# Patient Record
Sex: Male | Born: 1950 | Race: White | Hispanic: No | Marital: Married | Smoking: Former smoker
Health system: Southern US, Community
[De-identification: ages and names within clinical notes are randomized; demographics above are authoritative.]

## PROBLEM LIST (undated history)

## (undated) DIAGNOSIS — M6530 Trigger finger, unspecified finger: Secondary | ICD-10-CM

## (undated) DIAGNOSIS — R918 Other nonspecific abnormal finding of lung field: Secondary | ICD-10-CM

## (undated) DIAGNOSIS — H9190 Unspecified hearing loss, unspecified ear: Secondary | ICD-10-CM

## (undated) DIAGNOSIS — K8020 Calculus of gallbladder without cholecystitis without obstruction: Secondary | ICD-10-CM

## (undated) DIAGNOSIS — I2510 Atherosclerotic heart disease of native coronary artery without angina pectoris: Secondary | ICD-10-CM

## (undated) DIAGNOSIS — K219 Gastro-esophageal reflux disease without esophagitis: Secondary | ICD-10-CM

## (undated) DIAGNOSIS — I639 Cerebral infarction, unspecified: Secondary | ICD-10-CM

## (undated) DIAGNOSIS — R252 Cramp and spasm: Secondary | ICD-10-CM

## (undated) DIAGNOSIS — M1990 Unspecified osteoarthritis, unspecified site: Secondary | ICD-10-CM

## (undated) DIAGNOSIS — IMO0001 Reserved for inherently not codable concepts without codable children: Secondary | ICD-10-CM

## (undated) DIAGNOSIS — Z8719 Personal history of other diseases of the digestive system: Secondary | ICD-10-CM

## (undated) DIAGNOSIS — R7303 Prediabetes: Secondary | ICD-10-CM

## (undated) DIAGNOSIS — G5600 Carpal tunnel syndrome, unspecified upper limb: Secondary | ICD-10-CM

## (undated) DIAGNOSIS — H269 Unspecified cataract: Secondary | ICD-10-CM

## (undated) DIAGNOSIS — F32A Depression, unspecified: Secondary | ICD-10-CM

## (undated) DIAGNOSIS — R51 Headache: Secondary | ICD-10-CM

## (undated) DIAGNOSIS — K760 Fatty (change of) liver, not elsewhere classified: Secondary | ICD-10-CM

## (undated) DIAGNOSIS — C801 Malignant (primary) neoplasm, unspecified: Secondary | ICD-10-CM

## (undated) DIAGNOSIS — Z87442 Personal history of urinary calculi: Secondary | ICD-10-CM

## (undated) DIAGNOSIS — M8580 Other specified disorders of bone density and structure, unspecified site: Secondary | ICD-10-CM

## (undated) DIAGNOSIS — R52 Pain, unspecified: Secondary | ICD-10-CM

## (undated) DIAGNOSIS — Z973 Presence of spectacles and contact lenses: Secondary | ICD-10-CM

## (undated) DIAGNOSIS — K759 Inflammatory liver disease, unspecified: Secondary | ICD-10-CM

## (undated) DIAGNOSIS — F909 Attention-deficit hyperactivity disorder, unspecified type: Secondary | ICD-10-CM

## (undated) HISTORY — PX: TONSILLECTOMY: SUR1361

## (undated) HISTORY — PX: OTHER SURGICAL HISTORY: SHX169

## (undated) HISTORY — PX: ROOT CANAL: SHX2363

## (undated) HISTORY — PX: ROTATOR CUFF REPAIR: SHX139

## (undated) HISTORY — PX: TRIGGER FINGER RELEASE: SHX641

## (undated) HISTORY — PX: LUMBAR FUSION: SHX111

## (undated) HISTORY — PX: JOINT REPLACEMENT: SHX530

## (undated) HISTORY — PX: COLONOSCOPY: SHX174

---

## 1999-05-23 ENCOUNTER — Encounter: Payer: Self-pay | Admitting: Gastroenterology

## 1999-05-23 ENCOUNTER — Ambulatory Visit (HOSPITAL_COMMUNITY): Admission: RE | Admit: 1999-05-23 | Discharge: 1999-05-23 | Payer: Self-pay | Admitting: Gastroenterology

## 1999-09-12 DIAGNOSIS — K759 Inflammatory liver disease, unspecified: Secondary | ICD-10-CM

## 1999-09-12 HISTORY — DX: Inflammatory liver disease, unspecified: K75.9

## 2000-08-27 ENCOUNTER — Encounter (INDEPENDENT_AMBULATORY_CARE_PROVIDER_SITE_OTHER): Payer: Self-pay | Admitting: *Deleted

## 2000-08-27 ENCOUNTER — Ambulatory Visit (HOSPITAL_COMMUNITY): Admission: RE | Admit: 2000-08-27 | Discharge: 2000-08-27 | Payer: Self-pay | Admitting: Gastroenterology

## 2000-08-27 ENCOUNTER — Encounter: Payer: Self-pay | Admitting: Gastroenterology

## 2002-03-24 ENCOUNTER — Encounter: Payer: Self-pay | Admitting: Family Medicine

## 2002-03-24 ENCOUNTER — Encounter: Admission: RE | Admit: 2002-03-24 | Discharge: 2002-03-24 | Payer: Self-pay | Admitting: Family Medicine

## 2003-05-25 ENCOUNTER — Ambulatory Visit (HOSPITAL_COMMUNITY): Admission: RE | Admit: 2003-05-25 | Discharge: 2003-05-25 | Payer: Self-pay | Admitting: Gastroenterology

## 2003-05-25 ENCOUNTER — Encounter (INDEPENDENT_AMBULATORY_CARE_PROVIDER_SITE_OTHER): Payer: Self-pay | Admitting: Specialist

## 2003-12-04 ENCOUNTER — Emergency Department (HOSPITAL_COMMUNITY): Admission: EM | Admit: 2003-12-04 | Discharge: 2003-12-04 | Payer: Self-pay | Admitting: *Deleted

## 2005-02-08 ENCOUNTER — Encounter: Admission: RE | Admit: 2005-02-08 | Discharge: 2005-02-08 | Payer: Self-pay | Admitting: Family Medicine

## 2005-05-02 ENCOUNTER — Encounter: Admission: RE | Admit: 2005-05-02 | Discharge: 2005-05-02 | Payer: Self-pay | Admitting: Family Medicine

## 2005-08-29 ENCOUNTER — Encounter: Admission: RE | Admit: 2005-08-29 | Discharge: 2005-08-29 | Payer: Self-pay | Admitting: Orthopedic Surgery

## 2006-07-27 ENCOUNTER — Encounter: Admission: RE | Admit: 2006-07-27 | Discharge: 2006-07-27 | Payer: Self-pay | Admitting: Family Medicine

## 2007-10-07 ENCOUNTER — Encounter: Admission: RE | Admit: 2007-10-07 | Discharge: 2007-10-07 | Payer: Self-pay | Admitting: Family Medicine

## 2007-10-30 ENCOUNTER — Encounter: Payer: Self-pay | Admitting: Internal Medicine

## 2007-10-31 ENCOUNTER — Encounter (INDEPENDENT_AMBULATORY_CARE_PROVIDER_SITE_OTHER): Payer: Self-pay | Admitting: Internal Medicine

## 2007-10-31 ENCOUNTER — Ambulatory Visit: Payer: Self-pay | Admitting: Surgery

## 2007-10-31 ENCOUNTER — Ambulatory Visit (HOSPITAL_COMMUNITY): Admission: EM | Admit: 2007-10-31 | Discharge: 2007-10-31 | Payer: Self-pay | Admitting: Internal Medicine

## 2009-09-11 DIAGNOSIS — R519 Headache, unspecified: Secondary | ICD-10-CM

## 2009-09-11 HISTORY — PX: OTHER SURGICAL HISTORY: SHX169

## 2009-09-11 HISTORY — DX: Headache, unspecified: R51.9

## 2011-01-24 NOTE — Discharge Summary (Signed)
[name], [name]             ACCOUNT NO.:  [id number]   MEDICAL RECORD NO.:  [id number]          PATIENT TYPE:  OBV   LOCATION:  0102                         FACILITY:  [facility]   PHYSICIAN:  Corinna L. Lendell Caprice, MDDATE OF BIRTH:  1950/11/02   DATE OF ADMISSION:  10/30/2007  DATE OF DISCHARGE:  10/31/2007                               DISCHARGE SUMMARY   DISCHARGE DIAGNOSES:  1. Transient ischemic attack.  2. Hyperlipidemia.  3. History of hepatitis C.  4. Cigar smoker.   DISCHARGE MEDICATIONS:  1. Aspirin 325 mg a day.  2. Continue Zetia 10 mg a day.   DIET:  Should be low fat, low cholesterol.   He is encouraged to quit smoking cigars.   CONDITION:  Stable.   FOLLOW UP:  Dr. Manus Gunning in one to two weeks at which time the rest of  the hypercoagulable panel and the echocardiogram results can be followed  up.   CONSULTATION:  Bevelyn Buckles. Nash Shearer, M.D.   PROCEDURES:  None.   LABORATORY DATA:  Homocystine level normal.  LDL 116, HDL 31,  triglycerides 129, total cholesterol 173.  Point care enzymes negative.  Basic metabolic panel unremarkable.  Initial CBC was significant for a  white blood cell count of 13,000 but this normalized.  PT/PTT normal.  Hypercoagulable panel is pending.   SPECIAL STUDIES/RADIOLOGY:  Carotid Dopplers negative for stenoses.  Echocardiogram is pending.  EKG showed normal sinus rhythm with left  anterior fascicular block.  CT brain negative.  MRI of the brain,  preliminary report shows no stroke, chronic right maxillary sinusitis.  MRA of the brain preliminary is normal.   HISTORY AND HOSPITAL COURSE:  [name] is a 60 year old white male  patient of Dr. Manus Gunning, who presented with diplopia, gait disturbance.  He had fallen and was listing to the right.  He had some numbness and  tingling on the right side of his face as well as the fourth and fifth  finger of his right hand.  His hand seemed uncoordinated.  Some of his  symptoms lasted  for about 5 minutes, some lasted for 2 hours.  He was  dizzy.  By the time he was seen by me in the emergency room, he has a  normal neurologic exam, he had normal vital signs and had already been  seen by neurology.  He was placed on 23-hour observation in the  telemetry unit where he remained in normal sinus rhythm.  He was started  on an aspirin a day.  He has several family members with blood clotting  problems and a hypercoagulable workup was ordered and is pending.  Echocardiogram also is pending.  He had no recurrence of his symptoms.  He is on Zeta and apparently has never been on a Statin due to his  history of hepatitis C.  I recommended lifestyle modification and if his  LDL is still elevated, consider Niaspan or a Statin if appropriate.      Corinna L. Lendell Caprice, MD  Electronically Signed     CLS/MEDQ  D:  10/31/2007  T:  11/01/2007  Job:  [id number]  cc:   Bryan Lemma. Manus Gunning, M.D.

## 2011-01-24 NOTE — H&P (Signed)
NAMEKORBIN, MAPPS             ACCOUNT NO.:  [id number]   MEDICAL RECORD NO.:  [id number]          PATIENT TYPE:  EMS   LOCATION:  ED                           FACILITY:  [facility]   PHYSICIAN:  Corinna L. Lendell Caprice, MDDATE OF BIRTH:  [birth year]   DATE OF ADMISSION:  10/30/2007  DATE OF DISCHARGE:                              HISTORY & PHYSICAL   CHIEF COMPLAINT:  Visual disturbance and difficulty walking.   HISTORY OF PRESENT ILLNESS:  Mr. Strauch is a pleasant, 60 year old,  white male patient of Dr. Manus Gunning who presents after having an episode  of diplopia. One of his hands was having problems coordinating, he  cannot recall whether it was the right or the left. His right fourth and  fifth fingers were tingling. The lower part of the right side of his  face was tingling. His right leg seemed uncoordinated and he was having  difficulty walking, he fell. He has had the diplopia in the past but  never had problems with numbness or gait disturbance. He was told in the  past that he had migraines. He has hyperlipidemia. He smokes cigars.  He  has a previous tobacco use history. His symptoms have resolved. This  started sometime after lunch. He has already been seen by Dr. Nash Shearer  who recommends admission and workup for TIA. The patient has never had a  stroke. He is complaining of pain behind both eyes currently.   PAST MEDICAL HISTORY:  As above.  Also history of treated hepatitis C.   MEDICATIONS:  Zetia 10 mg a day.   SOCIAL HISTORY:  He smokes a few cigars per day and inhales. He denies  drug use. He is married.   FAMILY HISTORY:  Significant for hypertension. Also he has 1or 2  siblings with clotting disorders and or thromboses.   REVIEW OF SYSTEMS:  As above otherwise negative.   PHYSICAL EXAMINATION:  Temperature is 97.3, blood pressure 133/83, pulse  90, respiratory rate 18, oxygen saturation 98% on room air.  GENERAL:  The patient is well-nourished, well-developed in  no acute  distress.  HEENT:  Normocephalic, atraumatic.  Pupils equal, round, reactive to  light.  Sclerae are nonicteric.  Moist mucous membranes.  No nystagmus.  NECK:  Supple.  No thyromegaly. No carotid bruits.  No JVD or  lymphadenopathy.  LUNGS:  Clear to auscultation bilaterally without wheezes, rhonchi or  rales.  CARDIOVASCULAR:  Regular rate and rhythm without murmurs,  gallops or rubs.  ABDOMEN:  Normal bowel sounds, soft, nontender, nondistended.  GU/RECTAL:  Deferred.  EXTREMITIES:  No clubbing, cyanosis or edema.  SKIN:  No rash.  NEUROLOGIC:  The patient is alert and oriented x3.  Cranial nerves are  intact.  Motor strength 5/5.  Gait normal.  Romberg negative.  Finger-to-  nose normal.  Pinprick sensation intact. Deep tendon reflexes symmetric.  No pronator drift. Visual acuity is normal.  Visual fields full.   LABORATORY DATA:  White blood cell count is 13,000 with normal  differential otherwise normal CBC. PT/PTT normal.  Basic metabolic panel  unremarkable. Troponin less than 0.05, myoglobin 96,  CPK-MB less than 1.  EKG shows normal sinus rhythm with left axis deviation.  CT brain shows  nothing acute.   ASSESSMENT/PLAN:  1. Transient visual disturbance with gait disturbance and right-sided      paresthesias and discoordination:  The patient will be placed on 23-      hour observation.  I will start an aspirin a day.  Check MRI, MRA      of the brain, carotid Dopplers, echocardiogram, fasting lipids,      homocysteine level and hypercoagulable panel. He will get neuro      checks q. shift and DVT prophylaxis.  Dr. Nash Shearer will continue to      follow.  2. Hyperlipidemia.  3. History of hepatitis C.  4. Cigar smoker.  5. Leukocytosis. The patient is afebrile and has no signs or symptoms      of infection. I will simply repeat the CBC in the morning.      Corinna L. Lendell Caprice, MD  Electronically Signed     CLS/MEDQ  D:  10/30/2007  T:  10/31/2007  Job:   [id number]   cc:   Bryan Lemma. Manus Gunning, M.D.  Fax: [phone]

## 2011-01-24 NOTE — Consult Note (Signed)
[name] [name], [name] [name]             ACCOUNT NO.:  [id number]   MEDICAL RECORD NO.:  [id number]          PATIENT TYPE:  EMS   LOCATION:  ED                           FACILITY:  [facility]   PHYSICIAN:  Bevelyn Buckles. Champey, M.D.DATE OF BIRTH:  1951/07/10   DATE OF CONSULTATION:  10/30/2007  DATE OF DISCHARGE:                                 CONSULTATION   REFERRING PHYSICIAN:  Markham Jordan L. Effie Shy, M.D.   REASON FOR CONSULTATION:  TIA/stroke.   HISTORY OF PRESENT ILLNESS:  [name] [name] is a 60 year old Caucasian  male with past medical history of high cholesterol who presents after an  episode of unsteadiness, walking to the right, right facial numbness,  and fall.  The patient states he has a 1-1/2-year history of visual  migraines.  He has had a CT in the past which was negative.  He also has  been having for the last few months eye twitching and dizziness  episodes, around one per month.  Today he was driving and developed some  visual and eye trembling and proceeded to have decreased vision/blurred  vision and almost double vision.  This was followed by right lower face  numbness and the patient being unsteady on his feet, walking to the  right as he stumbled and held on when he tried to get out of his car.  This lasted about 5-10 minutes and was followed by extreme fatigue and  dizziness.  He states he is now back to baseline and denies any focal  weakness, further numbness, loss of consciousness, speech changes, or  swallowing problems/chewing problems.   PAST MEDICAL HISTORY:  1. History of hepatitis C.  2. High cholesterol.   CURRENT MEDICATIONS:  Zetia and nicotine patch.   ALLERGIES:  NONE.   FAMILY HISTORY:  Positive for hypertension and clotting problems.   SOCIAL HISTORY:  The patient lives with his wife and son.  He smokes one  cigar per day.  He drinks a few rum drinks per night.   REVIEW OF SYSTEMS:  Positive as per HPI.  Review of systems negative as  per HPI in greater  than seven other organ systems.   PHYSICAL EXAMINATION:  VITAL SIGNS:  Temperature is 97.3, blood pressure  is 133/83, pulse is 90, respirations 19, O2 saturation 98%.  HEENT:  Normocephalic, atraumatic.  Extraocular muscles intact.  Pupils  equal, round, and reactive to light.  Visual fields are intact.  NECK:  Supple.  No carotid bruits are heard.  HEART:  Regular.  LUNGS:  Clear.  ABDOMEN:  Soft, nontender.  EXTREMITIES:  Good pulses.  NEUROLOGIC:  The patient is awake, alert.  Language is fluent.  The  patient is following commands appropriately.  Cranial nerves II-XII  grossly intact.  Motor examination shows 5/5 strength and no drift  noted.  Sensory examination is within normal limits.  Light touch  reflexes are 1+ and symmetric.  Cerebellar function is within normal  limits.  Finger-to-nose, heel-to-shin, gait is within normal limits.  Negative Romberg.   LABORATORY DATA:  WBC 13.11, hemoglobin 16.7, hematocrit 48.2, platelets  248.  Sodium 142,  potassium 4.8, chloride is 105, CO2 is 31, BUN 10,  creatinine 0.95, glucose 111, calcium is 9.7.  PT is 13.8, INR is 1.0,  PTT is 25.   CT of the head done on October 07, 2007 showed no acute abnormalities.   IMPRESSION:  This is a 60 year old Caucasian male with transient right  facial numbness, walking to the right, and dizziness that has completely  resolved.  Concerning for posterior circulation transient ischemic  attack or small stroke.  The patient not a candidate for intravenous t-  PA.  Symptoms have resolved.  Would recommend starting the patient on  aspirin a day given his risk factors.  We will get an MRI/MRA of brain,  carotid Dopplers, 2-D echocardiogram, and check a hypercoagulable panel.  Check fasting lipids and homocysteine level.  Get physical therapy and  occupational therapy consults.  This patient on deep venous thrombosis  prophylaxis.  Will follow the patient as consultants.      Bevelyn Buckles. Nash Shearer,  M.D.  Electronically Signed     DRC/MEDQ  D:  10/30/2007  T:  10/31/2007  Job:  [id number]

## 2011-01-27 NOTE — Op Note (Signed)
   [name], [name]                       ACCOUNT NO.:  [id number]   MEDICAL RECORD NO.:  [id number]                   PATIENT TYPE:  AMB   LOCATION:  ENDO                                 FACILITY:  [facility]   PHYSICIAN:  Petra Kuba, M.D.                 DATE OF BIRTH:  [birth year]   DATE OF PROCEDURE:  DATE OF DISCHARGE:                                 OPERATIVE REPORT   PROCEDURE:  Colonoscopy.   ENDOSCOPIST:  Petra Kuba, M.D.   INDICATIONS FOR PROCEDURE:  Bright red blood per rectum probably due to  hemorrhoids.  The patient is due for colonic screening.   INFORMED CONSENT:  Consent was signed after risks, benefits, methods and  options were thoroughly discussed in the office.   MEDICATIONS USED:  Demerol 75 mg, Versed 7.5 mg.   DESCRIPTION OF PROCEDURE:  Rectal inspection was pertinent for external  hemorrhoids, small.  Digital exam was negative.   The video colonoscope was inserted and advanced around the colon to the  cecum.  This did require rolling him on his back and some abdominal  pressure.  No obvious abnormality was seen on insertion.  The cecum was  identified by the appendiceal orifice and the ileocecal valve.  The scope  was inserted a short ways into the terminal ileum which was normal.  Photodocumentation was obtained.  The scope was slowly withdrawn.  On slow  withdrawal through the colon, the prep was adequate.  There was some liquid  stool that required washing and suctioning.  The cecum, ascending and  transverse were normal.  As the scope was withdrawn around the left side of  the colon, multiple tiny hyperplastic appearing polyps were seen and were  hot biopsied.  All in the descending and sigmoid were hot biopsied.  Two in  the rectum were hot biopsied but a few in the rectum were not.  Anal rectal  pull through and retroflexion did confirm some small hemorrhoids.  No other  abnormalities were seen.  The scope was reinserted a short ways into  the  left side of the colon.  Air was suctioned and scope removed.  The patient  tolerated the procedure well.  There were no obvious immediate complication.   ENDOSCOPIC DIAGNOSES:  1. Internal and external hemorrhoids.  2. Multiple left-sided questionable hyperplastic appearing polyps, hot     biopsied.  3. Otherwise within normal limits to the terminal ileum.   PLAN:  1. Await pathology to determine future colonic screening.  2. Happy to see back p.r.n.  Otherwise, yearly rectals and guaiacs per Dr.     Levora Dredge, and at this junction every six months to one year liver tests     to screen for recurrence of hepatitis C.     MEM/MEDQ  D:  05/25/2003  T:  05/25/2003  Job:  [id number]

## 2011-06-02 LAB — LUPUS ANTICOAGULANT PANEL
DRVVT: 47.5 — ABNORMAL HIGH (ref 36.1–47.0)
Lupus Anticoagulant: NOT DETECTED
PTT Lupus Anticoagulant: 42.2 (ref 36.3–48.8)
dRVVT Incubated 1:1 Mix: 43.5 (ref 36.1–47.0)

## 2011-06-02 LAB — FACTOR 5 LEIDEN

## 2011-06-02 LAB — PROTHROMBIN GENE MUTATION

## 2011-06-02 LAB — BETA-2-GLYCOPROTEIN I ABS, IGG/M/A
Beta-2 Glyco I IgG: <4 U/mL (ref <20)
Beta-2-Glycoprotein I IgA: <4 U/mL (ref <10)
Beta-2-Glycoprotein I IgM: <4 U/mL (ref <10)

## 2011-06-02 LAB — LIPID PANEL
Cholesterol: 173
HDL: 31 — ABNORMAL LOW
LDL Cholesterol: 116 — ABNORMAL HIGH
Total CHOL/HDL Ratio: 5.6
Triglycerides: 129
VLDL: 26

## 2011-06-02 LAB — CBC
HCT: 47
Hemoglobin: 15.8
MCHC: 33.7
MCV: 93.2
Platelets: 212
RBC: 5.04
RDW: 12.7
WBC: 8.7

## 2011-06-02 LAB — PROTEIN C, TOTAL: Protein C, Total: 84 % (ref 70–140)

## 2011-06-02 LAB — ANTITHROMBIN III: AntiThromb III Func: 101 (ref 76–126)

## 2011-06-02 LAB — PROTEIN S, TOTAL: Protein S Ag, Total: 99 % (ref 70–140)

## 2011-06-02 LAB — HOMOCYSTEINE: Homocysteine: 8.7

## 2011-06-02 LAB — CARDIOLIPIN ANTIBODIES, IGG, IGM, IGA
Anticardiolipin IgA: 18 (ref <13)
Anticardiolipin IgG: <7 — ABNORMAL LOW (ref <11)
Anticardiolipin IgM: 15 (ref <10)

## 2011-06-02 LAB — PROTEIN C ACTIVITY: Protein C Activity: 129 % (ref 75–133)

## 2011-06-02 LAB — PROTEIN S ACTIVITY: Protein S Activity: 101 % (ref 69–129)

## 2011-06-05 LAB — BASIC METABOLIC PANEL
BUN: 10
CO2: 31
Calcium: 9.7
Chloride: 105
Creatinine, Ser: 0.95
GFR calc Af Amer: >60
GFR calc non Af Amer: >60
Glucose, Bld: 111 — ABNORMAL HIGH
Potassium: 4.8
Sodium: 142

## 2011-06-05 LAB — APTT: aPTT: 25

## 2011-06-05 LAB — DIFFERENTIAL
Basophils Absolute: 0
Basophils Relative: 0
Eosinophils Absolute: 0.2
Eosinophils Relative: 2
Lymphocytes Relative: 16
Lymphs Abs: 2.1
Monocytes Absolute: 0.8
Monocytes Relative: 6
Neutro Abs: 10 — ABNORMAL HIGH
Neutrophils Relative %: 76

## 2011-06-05 LAB — CBC
HCT: 48.2
Hemoglobin: 16.7
MCHC: 34.6
MCV: 91.9
Platelets: 248
RBC: 5.25
RDW: 13
WBC: 13.1 — ABNORMAL HIGH

## 2011-06-05 LAB — POCT CARDIAC MARKERS
CKMB, poc: <1 — ABNORMAL LOW
Myoglobin, poc: 96.8
Operator id: 1415
Troponin i, poc: <0.05

## 2011-06-05 LAB — PROTIME-INR
INR: 1
Prothrombin Time: 13.8

## 2011-06-05 LAB — HOMOCYSTEINE: Homocysteine: 9.5

## 2012-07-30 ENCOUNTER — Other Ambulatory Visit: Payer: Self-pay | Admitting: Family Medicine

## 2012-07-30 DIAGNOSIS — IMO0002 Reserved for concepts with insufficient information to code with codable children: Secondary | ICD-10-CM

## 2012-07-31 ENCOUNTER — Ambulatory Visit
Admission: RE | Admit: 2012-07-31 | Discharge: 2012-07-31 | Disposition: A | Discharge status: Home / Self Care | Payer: Managed Care, Other (non HMO) | Source: Ambulatory Visit | Attending: Family Medicine | Admitting: Family Medicine

## 2012-07-31 DIAGNOSIS — IMO0002 Reserved for concepts with insufficient information to code with codable children: Secondary | ICD-10-CM

## 2013-07-02 ENCOUNTER — Encounter (HOSPITAL_COMMUNITY): Payer: Self-pay | Admitting: Pharmacy Technician

## 2013-07-04 ENCOUNTER — Other Ambulatory Visit (HOSPITAL_COMMUNITY): Payer: Self-pay | Admitting: *Deleted

## 2013-07-04 NOTE — Patient Instructions (Signed)
20      Your procedure is scheduled on:  Monday 07/14/2013  Report to [facility] at [phone]  AM.  Call this number if you have problems the night before or morning of surgery:  [phone]   Remember:             IF YOU USE CPAP,BRING MASK AND TUBING AM OF SURGERY!   Do not eat food or drink liquids AFTER MIDNIGHT!  Take these medicines the morning of surgery with A SIP OF WATER: NONE   Do not bring valuables to the hospital. [facility] IS NOT RESPONSIBLE   FOR ANY BELONGINGS OR VALUABLES BROUGHT TO HOSPITAL.  Marland Kitchen  Leave suitcase in the car. After surgery it may be brought to your room.  For patients admitted to the hospital, checkout time is 11:00 AM the day of              Discharge.    DO NOT WEAR JEWELRY , MAKE-UP, LOTIONS,POWDERS,PERFUMES!             WOMEN -DO NOT SHAVE LEGS OR UNDERARMS 12 HRS. BEFORE SURGERY!               MEN MAY SHAVE AS USUAL!             CONTACTS,DENTURES OR BRIDGEWORK, FALSE EYELASHES MAY  NOT BE WORN INTO SURGERY!                                           Patients discharged the day of surgery will not be allowed to drive home.If going home the same day of surgery, must have someone stay with you first 24 hrs.at home and arrange for someone to drive you home from the Hospital.                           YOUR DRIVER IS:N/A   Special Instructions:             Please read over the following fact sheets that you were given:             1. [facility] PREPARING FOR SURGERY SHEET              2.[facility]                                        [place].Henson Fraticelli,RN,BSN     [phone]                FAILURE TO FOLLOW THESE INSTRUCTIONS MAY RESULT IN  CANCELLATION OF YOUR SURGERY!               Patient Signature:___________________________

## 2013-07-07 ENCOUNTER — Encounter (HOSPITAL_COMMUNITY): Payer: Self-pay

## 2013-07-07 ENCOUNTER — Encounter (HOSPITAL_COMMUNITY)
Admission: RE | Admit: 2013-07-07 | Discharge: 2013-07-07 | Disposition: A | Discharge status: Home / Self Care | Payer: BC Managed Care – PPO | Source: Ambulatory Visit | Attending: Orthopedic Surgery | Admitting: Orthopedic Surgery

## 2013-07-07 ENCOUNTER — Encounter (INDEPENDENT_AMBULATORY_CARE_PROVIDER_SITE_OTHER): Payer: Self-pay

## 2013-07-07 DIAGNOSIS — Z01812 Encounter for preprocedural laboratory examination: Secondary | ICD-10-CM | POA: Insufficient documentation

## 2013-07-07 DIAGNOSIS — Z01818 Encounter for other preprocedural examination: Secondary | ICD-10-CM | POA: Insufficient documentation

## 2013-07-07 HISTORY — DX: Unspecified osteoarthritis, unspecified site: M19.90

## 2013-07-07 HISTORY — DX: Inflammatory liver disease, unspecified: K75.9

## 2013-07-07 LAB — CBC
HCT: 50 % (ref 39.0–52.0)
Hemoglobin: 17.8 g/dL — ABNORMAL HIGH (ref 13.0–17.0)
MCH: 33.3 pg (ref 26.0–34.0)
MCHC: 35.6 g/dL (ref 30.0–36.0)
MCV: 93.5 fL (ref 78.0–100.0)
Platelets: 199 10*3/uL (ref 150–400)
RBC: 5.35 MIL/uL (ref 4.22–5.81)
RDW: 12.9 % (ref 11.5–15.5)
WBC: 7.9 10*3/uL (ref 4.0–10.5)

## 2013-07-07 LAB — URINALYSIS, ROUTINE W REFLEX MICROSCOPIC
Bilirubin Urine: NEGATIVE
Glucose, UA: NEGATIVE mg/dL
Hgb urine dipstick: NEGATIVE
Ketones, ur: NEGATIVE mg/dL
Leukocytes, UA: NEGATIVE
Nitrite: NEGATIVE
Protein, ur: NEGATIVE mg/dL
Specific Gravity, Urine: 1.022 (ref 1.005–1.030)
Urobilinogen, UA: 0.2 mg/dL (ref 0.0–1.0)
pH: 5 (ref 5.0–8.0)

## 2013-07-07 LAB — SURGICAL PCR SCREEN
MRSA, PCR: INVALID — AB
Staphylococcus aureus: INVALID — AB

## 2013-07-07 LAB — BASIC METABOLIC PANEL
BUN: 13 mg/dL (ref 6–23)
CO2: 24 mEq/L (ref 19–32)
Calcium: 9.6 mg/dL (ref 8.4–10.5)
Chloride: 101 mEq/L (ref 96–112)
Creatinine, Ser: 0.8 mg/dL (ref 0.50–1.35)
GFR calc Af Amer: >90 mL/min (ref >90)
GFR calc non Af Amer: >90 mL/min (ref >90)
Glucose, Bld: 126 mg/dL — ABNORMAL HIGH (ref 70–99)
Potassium: 4 mEq/L (ref 3.5–5.1)
Sodium: 137 mEq/L (ref 135–145)

## 2013-07-07 LAB — PROTIME-INR
INR: 1.02 (ref 0.00–1.49)
Prothrombin Time: 13.2 seconds (ref 11.6–15.2)

## 2013-07-07 LAB — APTT: aPTT: 29 seconds (ref 24–37)

## 2013-07-07 NOTE — Progress Notes (Signed)
Pre-op clearance note on chart from Dr. Manus Gunning 05/06/2013.

## 2013-07-07 NOTE — Progress Notes (Signed)
07/07/13 0859  OBSTRUCTIVE SLEEP APNEA  Have you ever been diagnosed with sleep apnea through a sleep study? No  Do you snore loudly (loud enough to be heard through closed doors)?  1  Do you often feel tired, fatigued, or sleepy during the daytime? 1  Has anyone observed you stop breathing during your sleep? 0  Do you have, or are you being treated for high blood pressure? 0  BMI more than 35 kg/m2? 0  Age over 60 years old? 1  Neck circumference greater than 40 cm/18 inches? 0  Gender: 1  Obstructive Sleep Apnea Score 4  Score 4 or greater  Results sent to PCP

## 2013-07-09 LAB — MRSA CULTURE

## 2013-07-13 NOTE — H&P (Signed)
TOTAL KNEE ADMISSION H&P  Patient is being admitted for left total knee arthroplasty.  Subjective:  Chief Complaint:    Left knee OA / pain.  HPI: [name] [name], 62 y.o. male, has a history of pain and functional disability in the left knee due to arthritis and has failed non-surgical conservative treatments for greater than 12 weeks to includeNSAID's and/or analgesics, corticosteriod injections, viscosupplementation injections and activity modification.  Onset of symptoms was gradual, starting >10 years ago with gradually worsening course since that time. The patient noted prior procedures on the knee to include  arthroscopy on the left knee(s).  Patient currently rates pain in the left knee(s) at 7 out of 10 with activity. Patient has night pain, worsening of pain with activity and weight bearing, pain that interferes with activities of daily living, pain with passive range of motion, crepitus and joint swelling.  Patient has evidence of periarticular osteophytes and joint space narrowing by imaging studies. There is no active infection.  Risks, benefits and expectations were discussed with the patient. Patient understand the risks, benefits and expectations and wishes to proceed with surgery.   D/C Plans:   Home with HHPT  Post-op Meds:    No Rx given  Tranexamic Acid:   To be given  Decadron:    To be given  FYI:    ASA post-op   There are no active problems to display for this patient.  Past Medical History  Diagnosis Date  . Arthritis   . Hepatitis 2001    C -treated and no problems since    Past Surgical History  Procedure Laterality Date  . Tonsillectomy      as child  . Left ankle surgery      fractured  . Left shoulder      torn ligaments left shoulder  . Left knee surgery      removed ligaments    No prescriptions prior to admission   Allergies  Allergen Reactions  . Statins Other (See Comments)    Muscle pain    History  Substance Use Topics  . Smoking  status: Former Smoker    Quit date: 04/30/2012  . Smokeless tobacco: Not on file  . Alcohol Use: Yes     Comment: occassionally    No family history on file.   Review of Systems  Constitutional: Negative.   HENT: Negative.   Eyes: Negative.   Respiratory: Positive for shortness of breath (on exertion).   Cardiovascular: Negative.   Gastrointestinal: Negative.   Genitourinary: Negative.   Musculoskeletal: Positive for joint pain.  Skin: Negative.   Neurological: Negative.   Endo/Heme/Allergies: Negative.   Psychiatric/Behavioral: Negative.     Objective:  Physical Exam  Constitutional: He is oriented to person, place, and time. He appears well-developed and well-nourished.  HENT:  Head: Normocephalic and atraumatic.  Mouth/Throat: Oropharynx is clear and moist.  Eyes: Pupils are equal, round, and reactive to light.  Neck: Neck supple. No JVD present. No tracheal deviation present. No thyromegaly present.  Cardiovascular: Normal rate, regular rhythm and intact distal pulses.   Respiratory: Effort normal and breath sounds normal. No respiratory distress. He has no wheezes.  GI: Soft. There is no tenderness. There is no guarding.  Musculoskeletal:       Left knee: He exhibits decreased range of motion, swelling and bony tenderness. He exhibits no effusion, no ecchymosis, no laceration and no erythema. Tenderness found.  Lymphadenopathy:    He has no cervical adenopathy.  Neurological:  He is alert and oriented to person, place, and time.  Skin: Skin is warm and dry.  Psychiatric: He has a normal mood and affect.     Imaging Review Plain radiographs demonstrate severe degenerative joint disease of the left knee(s). The overall alignment isneutral. The bone quality appears to be good for age and reported activity level.  Assessment/Plan:  End stage arthritis, left knee   The patient history, physical examination, clinical judgment of the provider and imaging studies are  consistent with end stage degenerative joint disease of the left knee(s) and total knee arthroplasty is deemed medically necessary. The treatment options including medical management, injection therapy arthroscopy and arthroplasty were discussed at length. The risks and benefits of total knee arthroplasty were presented and reviewed. The risks due to aseptic loosening, infection, stiffness, patella tracking problems, thromboembolic complications and other imponderables were discussed. The patient acknowledged the explanation, agreed to proceed with the plan and consent was signed. Patient is being admitted for inpatient treatment for surgery, pain control, PT, OT, prophylactic antibiotics, VTE prophylaxis, progressive ambulation and ADL's and discharge planning. The patient is planning to be discharged home with home health services.    [name]   PAC  07/13/2013, 8:55 PM

## 2013-07-14 ENCOUNTER — Encounter (HOSPITAL_COMMUNITY): Payer: Self-pay | Admitting: *Deleted

## 2013-07-14 ENCOUNTER — Encounter (HOSPITAL_COMMUNITY): Payer: BC Managed Care – PPO | Admitting: Anesthesiology

## 2013-07-14 ENCOUNTER — Encounter (HOSPITAL_COMMUNITY)
Admission: RE | Disposition: A | Discharge status: Home Health | Payer: Self-pay | Source: Ambulatory Visit | Attending: Orthopedic Surgery

## 2013-07-14 ENCOUNTER — Inpatient Hospital Stay (HOSPITAL_COMMUNITY): Payer: BC Managed Care – PPO | Admitting: Anesthesiology

## 2013-07-14 ENCOUNTER — Inpatient Hospital Stay (HOSPITAL_COMMUNITY)
Admission: RE | Admit: 2013-07-14 | Discharge: 2013-07-15 | DRG: 470 | Disposition: A | Discharge status: Home Health | Payer: BC Managed Care – PPO | Source: Ambulatory Visit | Attending: Orthopedic Surgery | Admitting: Orthopedic Surgery

## 2013-07-14 DIAGNOSIS — Z96659 Presence of unspecified artificial knee joint: Secondary | ICD-10-CM

## 2013-07-14 DIAGNOSIS — M6580 Other synovitis and tenosynovitis, unspecified site: Secondary | ICD-10-CM | POA: Diagnosis present

## 2013-07-14 DIAGNOSIS — D500 Iron deficiency anemia secondary to blood loss (chronic): Secondary | ICD-10-CM | POA: Diagnosis not present

## 2013-07-14 DIAGNOSIS — D62 Acute posthemorrhagic anemia: Secondary | ICD-10-CM | POA: Diagnosis not present

## 2013-07-14 DIAGNOSIS — Z6829 Body mass index (BMI) 29.0-29.9, adult: Secondary | ICD-10-CM

## 2013-07-14 DIAGNOSIS — E663 Overweight: Secondary | ICD-10-CM

## 2013-07-14 DIAGNOSIS — Z01812 Encounter for preprocedural laboratory examination: Secondary | ICD-10-CM

## 2013-07-14 DIAGNOSIS — M1710 Unilateral primary osteoarthritis, unspecified knee: Principal | ICD-10-CM | POA: Diagnosis present

## 2013-07-14 DIAGNOSIS — Z96652 Presence of left artificial knee joint: Secondary | ICD-10-CM

## 2013-07-14 HISTORY — PX: TOTAL KNEE ARTHROPLASTY: SHX125

## 2013-07-14 LAB — TYPE AND SCREEN
ABO/RH(D): O NEG
Antibody Screen: NEGATIVE

## 2013-07-14 LAB — ABO/RH: ABO/RH(D): O NEG

## 2013-07-14 SURGERY — ARTHROPLASTY, KNEE, TOTAL
Anesthesia: Spinal | Site: Knee | Laterality: Left | Wound class: Clean

## 2013-07-14 MED ORDER — METOCLOPRAMIDE HCL 10 MG PO TABS: 5.0000 mg | ORAL_TABLET | Freq: Three times a day (TID) | ORAL | Status: DC | PRN

## 2013-07-14 MED ORDER — FERROUS SULFATE 325 (65 FE) MG PO TABS
325.0000 mg | ORAL_TABLET | Freq: Three times a day (TID) | ORAL | Status: DC
  Administered 2013-07-14 – 2013-07-15 (×2): 325 mg via ORAL
  Filled 2013-07-14 (×5): qty 1

## 2013-07-14 MED ORDER — SODIUM CHLORIDE 0.9 % IJ SOLN
INTRAMUSCULAR | Status: DC | PRN
  Administered 2013-07-14: 11:00:00

## 2013-07-14 MED ORDER — CELECOXIB 200 MG PO CAPS
200.0000 mg | ORAL_CAPSULE | Freq: Two times a day (BID) | ORAL | Status: DC
  Administered 2013-07-14 – 2013-07-15 (×2): 200 mg via ORAL
  Filled 2013-07-14 (×3): qty 1

## 2013-07-14 MED ORDER — CEFAZOLIN SODIUM-DEXTROSE 2-3 GM-% IV SOLR
2.0000 g | Freq: Four times a day (QID) | INTRAVENOUS | Status: AC
  Administered 2013-07-14 (×2): 2 g via INTRAVENOUS
  Filled 2013-07-14 (×2): qty 50

## 2013-07-14 MED ORDER — 0.9 % SODIUM CHLORIDE (POUR BTL) OPTIME
TOPICAL | Status: DC | PRN
  Administered 2013-07-14: 1000 mL

## 2013-07-14 MED ORDER — ONDANSETRON HCL 4 MG PO TABS: 4.0000 mg | ORAL_TABLET | Freq: Four times a day (QID) | ORAL | Status: DC | PRN

## 2013-07-14 MED ORDER — CEFAZOLIN SODIUM-DEXTROSE 2-3 GM-% IV SOLR
INTRAVENOUS | Status: AC
  Filled 2013-07-14: qty 50

## 2013-07-14 MED ORDER — PHENOL 1.4 % MT LIQD
1.0000 | OROMUCOSAL | Status: DC | PRN
  Filled 2013-07-14: qty 177

## 2013-07-14 MED ORDER — LORATADINE 10 MG PO TABS
10.0000 mg | ORAL_TABLET | Freq: Every day | ORAL | Status: DC
  Administered 2013-07-15: 10 mg via ORAL
  Filled 2013-07-14: qty 1

## 2013-07-14 MED ORDER — KETOROLAC TROMETHAMINE 30 MG/ML IJ SOLN
INTRAMUSCULAR | Status: AC
  Filled 2013-07-14: qty 1

## 2013-07-14 MED ORDER — BUPIVACAINE-EPINEPHRINE 0.25% -1:200000 IJ SOLN
INTRAMUSCULAR | Status: DC | PRN
  Administered 2013-07-14: 25 mL

## 2013-07-14 MED ORDER — DEXAMETHASONE SODIUM PHOSPHATE 10 MG/ML IJ SOLN
10.0000 mg | Freq: Once | INTRAMUSCULAR | Status: AC
  Administered 2013-07-15: 10 mg via INTRAVENOUS
  Filled 2013-07-14: qty 1

## 2013-07-14 MED ORDER — FENTANYL CITRATE 0.05 MG/ML IJ SOLN
INTRAMUSCULAR | Status: DC | PRN
  Administered 2013-07-14: 25 ug via INTRAVENOUS
  Administered 2013-07-14: 50 ug via INTRAVENOUS
  Administered 2013-07-14: 25 ug via INTRAVENOUS
  Administered 2013-07-14 (×2): 50 ug via INTRAVENOUS
  Administered 2013-07-14: 100 ug via INTRAVENOUS
  Administered 2013-07-14 (×2): 25 ug via INTRAVENOUS

## 2013-07-14 MED ORDER — BISACODYL 10 MG RE SUPP: 10.0000 mg | Freq: Every day | RECTAL | Status: DC | PRN

## 2013-07-14 MED ORDER — DOCUSATE SODIUM 100 MG PO CAPS
100.0000 mg | ORAL_CAPSULE | Freq: Two times a day (BID) | ORAL | Status: DC
  Administered 2013-07-14 – 2013-07-15 (×2): 100 mg via ORAL

## 2013-07-14 MED ORDER — ZOLPIDEM TARTRATE 5 MG PO TABS: 5.0000 mg | ORAL_TABLET | Freq: Every evening | ORAL | Status: DC | PRN

## 2013-07-14 MED ORDER — DEXAMETHASONE SODIUM PHOSPHATE 10 MG/ML IJ SOLN
10.0000 mg | INTRAMUSCULAR | Status: AC
  Administered 2013-07-14: 10 mg via INTRAVENOUS

## 2013-07-14 MED ORDER — SODIUM CHLORIDE 0.9 % IJ SOLN
INTRAMUSCULAR | Status: AC
  Filled 2013-07-14: qty 50

## 2013-07-14 MED ORDER — ALUM & MAG HYDROXIDE-SIMETH 200-200-20 MG/5ML PO SUSP: 30.0000 mL | ORAL | Status: DC | PRN

## 2013-07-14 MED ORDER — DIPHENHYDRAMINE HCL 25 MG PO CAPS: 25.0000 mg | ORAL_CAPSULE | Freq: Four times a day (QID) | ORAL | Status: DC | PRN

## 2013-07-14 MED ORDER — ONDANSETRON HCL 4 MG/2ML IJ SOLN: 4.0000 mg | Freq: Four times a day (QID) | INTRAMUSCULAR | Status: DC | PRN

## 2013-07-14 MED ORDER — METHOCARBAMOL 100 MG/ML IJ SOLN
500.0000 mg | Freq: Four times a day (QID) | INTRAVENOUS | Status: DC | PRN
  Administered 2013-07-14: 14:00:00 500 mg via INTRAVENOUS
  Filled 2013-07-14 (×2): qty 5

## 2013-07-14 MED ORDER — PROPOFOL 10 MG/ML IV BOLUS
INTRAVENOUS | Status: DC | PRN
  Administered 2013-07-14: 30 mg via INTRAVENOUS

## 2013-07-14 MED ORDER — LACTATED RINGERS IV SOLN
INTRAVENOUS | Status: DC | PRN
  Administered 2013-07-14 (×3): via INTRAVENOUS

## 2013-07-14 MED ORDER — POLYETHYLENE GLYCOL 3350 17 G PO PACK
17.0000 g | PACK | Freq: Two times a day (BID) | ORAL | Status: DC
  Administered 2013-07-14 – 2013-07-15 (×2): 17 g via ORAL

## 2013-07-14 MED ORDER — METHOCARBAMOL 500 MG PO TABS
500.0000 mg | ORAL_TABLET | Freq: Four times a day (QID) | ORAL | Status: DC | PRN
  Administered 2013-07-14 – 2013-07-15 (×3): 500 mg via ORAL
  Filled 2013-07-14 (×3): qty 1

## 2013-07-14 MED ORDER — BUPIVACAINE-EPINEPHRINE PF 0.25-1:200000 % IJ SOLN
INTRAMUSCULAR | Status: AC
  Filled 2013-07-14: qty 30

## 2013-07-14 MED ORDER — METOCLOPRAMIDE HCL 5 MG/ML IJ SOLN: 5.0000 mg | Freq: Three times a day (TID) | INTRAMUSCULAR | Status: DC | PRN

## 2013-07-14 MED ORDER — SODIUM CHLORIDE 0.9 % IR SOLN
Status: DC | PRN
  Administered 2013-07-14: 1000 mL

## 2013-07-14 MED ORDER — HYDROMORPHONE HCL PF 1 MG/ML IJ SOLN
0.5000 mg | INTRAMUSCULAR | Status: DC | PRN
Start: 2013-07-14 — End: 2013-07-15
  Administered 2013-07-14 – 2013-07-15 (×4): 1 mg via INTRAVENOUS
  Filled 2013-07-14 (×4): qty 1

## 2013-07-14 MED ORDER — FLEET ENEMA 7-19 GM/118ML RE ENEM: 1.0000 | ENEMA | Freq: Once | RECTAL | Status: AC | PRN

## 2013-07-14 MED ORDER — TRANEXAMIC ACID 100 MG/ML IV SOLN
1000.0000 mg | Freq: Once | INTRAVENOUS | Status: AC
  Administered 2013-07-14: 1000 mg via INTRAVENOUS
  Filled 2013-07-14: qty 10

## 2013-07-14 MED ORDER — PROMETHAZINE HCL 25 MG/ML IJ SOLN: 6.2500 mg | INTRAMUSCULAR | Status: DC | PRN

## 2013-07-14 MED ORDER — KETOROLAC TROMETHAMINE 30 MG/ML IJ SOLN
INTRAMUSCULAR | Status: DC | PRN
  Administered 2013-07-14: 30 mg

## 2013-07-14 MED ORDER — MENTHOL 3 MG MT LOZG
1.0000 | LOZENGE | OROMUCOSAL | Status: DC | PRN
  Filled 2013-07-14: qty 9

## 2013-07-14 MED ORDER — PROPOFOL INFUSION 10 MG/ML OPTIME
INTRAVENOUS | Status: DC | PRN
  Administered 2013-07-14: 100 ug/kg/min via INTRAVENOUS

## 2013-07-14 MED ORDER — HYDROCODONE-ACETAMINOPHEN 7.5-325 MG PO TABS
1.0000 | ORAL_TABLET | ORAL | Status: DC
  Administered 2013-07-14 – 2013-07-15 (×6): 2 via ORAL
  Filled 2013-07-14 (×6): qty 2

## 2013-07-14 MED ORDER — SODIUM CHLORIDE 0.9 % IV SOLN
INTRAVENOUS | Status: DC
  Administered 2013-07-14 – 2013-07-15 (×2): via INTRAVENOUS
  Filled 2013-07-14 (×5): qty 1000

## 2013-07-14 MED ORDER — BUPIVACAINE LIPOSOME 1.3 % IJ SUSP
20.0000 mL | Freq: Once | INTRAMUSCULAR | Status: DC
  Filled 2013-07-14: qty 20

## 2013-07-14 MED ORDER — HYDROMORPHONE HCL PF 1 MG/ML IJ SOLN: 0.2500 mg | INTRAMUSCULAR | Status: DC | PRN

## 2013-07-14 MED ORDER — CEFAZOLIN SODIUM-DEXTROSE 2-3 GM-% IV SOLR
2.0000 g | INTRAVENOUS | Status: AC
  Administered 2013-07-14: 2 g via INTRAVENOUS

## 2013-07-14 MED ORDER — MIDAZOLAM HCL 5 MG/5ML IJ SOLN
INTRAMUSCULAR | Status: DC | PRN
  Administered 2013-07-14 (×2): 2 mg via INTRAVENOUS

## 2013-07-14 MED ORDER — LACTATED RINGERS IV SOLN
INTRAVENOUS | Status: DC
  Administered 2013-07-14: 1000 mL via INTRAVENOUS

## 2013-07-14 MED ORDER — ASPIRIN EC 325 MG PO TBEC
325.0000 mg | DELAYED_RELEASE_TABLET | Freq: Two times a day (BID) | ORAL | Status: DC
  Administered 2013-07-15: 08:00:00 325 mg via ORAL
  Filled 2013-07-14 (×3): qty 1

## 2013-07-14 SURGICAL SUPPLY — 57 items
ADH SKN CLS APL DERMABOND .7 (GAUZE/BANDAGES/DRESSINGS) ×1
BAG SPEC THK2 15X12 ZIP CLS (MISCELLANEOUS) ×1
BAG ZIPLOCK 12X15 (MISCELLANEOUS) ×2 IMPLANT
BANDAGE ELASTIC 6 VELCRO ST LF (GAUZE/BANDAGES/DRESSINGS) ×2 IMPLANT
BANDAGE ESMARK 6X9 LF (GAUZE/BANDAGES/DRESSINGS) ×1 IMPLANT
BLADE SAW SGTL 13.0X1.19X90.0M (BLADE) ×2 IMPLANT
BNDG CMPR 9X6 STRL LF SNTH (GAUZE/BANDAGES/DRESSINGS) ×1
BNDG ESMARK 6X9 LF (GAUZE/BANDAGES/DRESSINGS) ×2
BOWL SMART MIX CTS (DISPOSABLE) ×2 IMPLANT
CAPT RP KNEE ×1 IMPLANT
CEMENT HV SMART SET (Cement) ×2 IMPLANT
CUFF TOURN SGL QUICK 34 (TOURNIQUET CUFF) ×2
CUFF TRNQT CYL 34X4X40X1 (TOURNIQUET CUFF) ×1 IMPLANT
DECANTER SPIKE VIAL GLASS SM (MISCELLANEOUS) ×1 IMPLANT
DERMABOND ADVANCED (GAUZE/BANDAGES/DRESSINGS) ×1
DERMABOND ADVANCED .7 DNX12 (GAUZE/BANDAGES/DRESSINGS) ×1 IMPLANT
DRAPE EXTREMITY T 121X128X90 (DRAPE) ×2 IMPLANT
DRAPE POUCH INSTRU U-SHP 10X18 (DRAPES) ×2 IMPLANT
DRAPE U-SHAPE 47X51 STRL (DRAPES) ×2 IMPLANT
DRSG AQUACEL AG ADV 3.5X10 (GAUZE/BANDAGES/DRESSINGS) ×2 IMPLANT
DRSG TEGADERM 4X4.75 (GAUZE/BANDAGES/DRESSINGS) ×2 IMPLANT
DURAPREP 26ML APPLICATOR (WOUND CARE) ×4 IMPLANT
ELECT REM PT RETURN 9FT ADLT (ELECTROSURGICAL) ×2
ELECTRODE REM PT RTRN 9FT ADLT (ELECTROSURGICAL) ×1 IMPLANT
EVACUATOR 1/8 PVC DRAIN (DRAIN) ×2 IMPLANT
FACESHIELD LNG OPTICON STERILE (SAFETY) ×10 IMPLANT
GAUZE SPONGE 2X2 8PLY STRL LF (GAUZE/BANDAGES/DRESSINGS) ×1 IMPLANT
GLOVE BIOGEL PI IND STRL 7.5 (GLOVE) ×1 IMPLANT
GLOVE BIOGEL PI IND STRL 8 (GLOVE) ×1 IMPLANT
GLOVE BIOGEL PI INDICATOR 7.5 (GLOVE) ×1
GLOVE BIOGEL PI INDICATOR 8 (GLOVE) ×1
GLOVE ECLIPSE 8.0 STRL XLNG CF (GLOVE) ×2 IMPLANT
GLOVE ORTHO TXT STRL SZ7.5 (GLOVE) ×4 IMPLANT
GOWN BRE IMP PREV XXLGXLNG (GOWN DISPOSABLE) ×2 IMPLANT
GOWN PREVENTION PLUS LG XLONG (DISPOSABLE) ×4 IMPLANT
HANDPIECE INTERPULSE COAX TIP (DISPOSABLE) ×2
KIT BASIN OR (CUSTOM PROCEDURE TRAY) ×2 IMPLANT
MANIFOLD NEPTUNE II (INSTRUMENTS) ×2 IMPLANT
NDL SAFETY ECLIPSE 18X1.5 (NEEDLE) ×1 IMPLANT
NEEDLE HYPO 18GX1.5 SHARP (NEEDLE) ×2
NS IRRIG 1000ML POUR BTL (IV SOLUTION) ×2 IMPLANT
PACK TOTAL JOINT (CUSTOM PROCEDURE TRAY) ×2 IMPLANT
POSITIONER SURGICAL ARM (MISCELLANEOUS) ×2 IMPLANT
SET HNDPC FAN SPRY TIP SCT (DISPOSABLE) ×1 IMPLANT
SET PAD KNEE POSITIONER (MISCELLANEOUS) ×2 IMPLANT
SPONGE GAUZE 2X2 STER 10/PKG (GAUZE/BANDAGES/DRESSINGS) ×1
SUCTION FRAZIER 12FR DISP (SUCTIONS) ×2 IMPLANT
SUT MNCRL AB 4-0 PS2 18 (SUTURE) ×2 IMPLANT
SUT VIC AB 1 CT1 36 (SUTURE) ×2 IMPLANT
SUT VIC AB 2-0 CT1 27 (SUTURE) ×6
SUT VIC AB 2-0 CT1 TAPERPNT 27 (SUTURE) ×3 IMPLANT
SUT VLOC 180 0 24IN GS25 (SUTURE) ×2 IMPLANT
SYR 50ML LL SCALE MARK (SYRINGE) ×2 IMPLANT
TOWEL OR 17X26 10 PK STRL BLUE (TOWEL DISPOSABLE) ×4 IMPLANT
TRAY FOLEY CATH 14FRSI W/METER (CATHETERS) ×2 IMPLANT
WATER STERILE IRR 1500ML POUR (IV SOLUTION) ×3 IMPLANT
WRAP KNEE MAXI GEL POST OP (GAUZE/BANDAGES/DRESSINGS) ×2 IMPLANT

## 2013-07-14 NOTE — Evaluation (Signed)
Physical Therapy Evaluation Patient Details Name: [name] [name] MRN: [id number] DOB: 1951-08-30 Today's Date: 07/14/2013 Time: 7829-5621 PT Time Calculation (min): 22 min  PT Assessment / Plan / Recommendation History of Present Illness  LTKA  Clinical Impression  Pt tolerated ambulating in hall, Plans for DC tomorrow. Pain = 8 after ambulation. Pt will benefit from PT to address problems the patient wants to try crutches.    PT Assessment  Patient needs continued PT services    Follow Up Recommendations  Home health PT    Does the patient have the potential to tolerate intense rehabilitation      Barriers to Discharge        Equipment Recommendations  Rolling walker with 5" wheels (may not need, use crutches.)    Recommendations for Other Services     Frequency 7X/week    Precautions / Restrictions Precautions Precautions: Fall;Knee   Pertinent Vitals/Pain 4-8. L knee, ice      Mobility  Bed Mobility Bed Mobility: Supine to Sit;Sit to Supine Supine to Sit: 4: Min guard Sit to Supine: 4: Min guard Details for Bed Mobility Assistance: pt managed LLE  Transfers Transfers: Sit to Stand;Stand to Sit Sit to Stand: 4: Min assist;From bed;From chair/3-in-1;With upper extremity assist;With armrests Stand to Sit: 4: Min assist;To bed;To chair/3-in-1;With upper extremity assist;With armrests Details for Transfer Assistance: cues for safety and UE position. cues for slow process to stand. Ambulation/Gait Ambulation/Gait Assistance: 4: Min assist Ambulation Distance (Feet): 100 Feet Assistive device: Rolling walker Ambulation/Gait Assistance Details: cues for step lenght and safe use of RW. Gait Pattern: Step-through pattern;Step-to pattern;Antalgic    Exercises Total Joint Exercises Quad Sets: AROM;Left;5 reps;Supine   PT Diagnosis: Difficulty walking;Acute pain  PT Problem List: Decreased strength;Decreased range of motion;Decreased activity tolerance;Decreased  mobility;Decreased knowledge of use of DME;Decreased safety awareness;Decreased knowledge of precautions;Pain PT Treatment Interventions: DME instruction;Gait training;Stair training;Functional mobility training;Patient/family education;Therapeutic activities;Therapeutic exercise     PT Goals(Current goals can be found in the care plan section) Acute Rehab PT Goals Patient Stated Goal: to walk PT Goal Formulation: With patient/family Time For Goal Achievement: 07/17/13 Potential to Achieve Goals: Good  Visit Information  Last PT Received On: 07/14/13 Assistance Needed: +1 History of Present Illness: LTKA       Prior Functioning  Home Living Family/patient expects to be discharged to:: Private residence Living Arrangements: Spouse/significant other Available Help at Discharge: Family Type of Home: House Home Access: Stairs to enter Secretary/administrator of Steps: 3 Entrance Stairs-Rails: None Home Layout: One level Home Equipment: Crutches Prior Function Level of Independence: Independent Communication Communication: No difficulties    Cognition  Cognition Arousal/Alertness: Awake/alert Behavior During Therapy: WFL for tasks assessed/performed Overall Cognitive Status: Within Functional Limits for tasks assessed    Extremity/Trunk Assessment Upper Extremity Assessment Upper Extremity Assessment: Overall WFL for tasks assessed Lower Extremity Assessment Lower Extremity Assessment: LLE deficits/detail LLE Deficits / Details: able to perform SLR, knee flexed to 40   Balance    End of Session PT - End of Session Activity Tolerance: Patient tolerated treatment well Patient left: in bed;with call bell/phone within reach;with family/visitor present Nurse Communication: Mobility status  GP     Rada Hay 07/14/2013, 6:29 PM Blanchard Kelch PT [phone]

## 2013-07-14 NOTE — Plan of Care (Signed)
Problem: Consults Goal: Diagnosis- Total Joint Replacement Left total knee     

## 2013-07-14 NOTE — Anesthesia Preprocedure Evaluation (Addendum)
Anesthesia Evaluation  Patient identified by MRN, date of birth, ID band Patient awake    Reviewed: Allergy & Precautions, H&P , NPO status , Patient's Chart, lab work & pertinent test results  Airway Mallampati: II TM Distance: >3 FB Neck ROM: Full    Dental no notable dental hx.    Pulmonary neg pulmonary ROS,  breath sounds clear to auscultation  Pulmonary exam normal       Cardiovascular negative cardio ROS  Rhythm:Regular Rate:Normal     Neuro/Psych negative neurological ROS  negative psych ROS   GI/Hepatic negative GI ROS, Neg liver ROS,   Endo/Other  negative endocrine ROS  Renal/GU negative Renal ROS  negative genitourinary   Musculoskeletal negative musculoskeletal ROS (+)   Abdominal   Peds negative pediatric ROS (+)  Hematology negative hematology ROS (+)   Anesthesia Other Findings   Reproductive/Obstetrics negative OB ROS                           Anesthesia Physical Anesthesia Plan  ASA: II  Anesthesia Plan: Spinal   Post-op Pain Management:    Induction: Intravenous  Airway Management Planned: Simple Face Mask  Additional Equipment:   Intra-op Plan:   Post-operative Plan:   Informed Consent: I have reviewed the patients History and Physical, chart, labs and discussed the procedure including the risks, benefits and alternatives for the proposed anesthesia with the patient or authorized representative who has indicated his/her understanding and acceptance.     Plan Discussed with: CRNA and Surgeon  Anesthesia Plan Comments:         Anesthesia Quick Evaluation  

## 2013-07-14 NOTE — Anesthesia Procedure Notes (Signed)
Spinal  Patient location during procedure: OR End time: 07/14/2013 10:16 AM Staffing CRNA/Resident: Treyveon Mochizuki E Preanesthetic Checklist Completed: patient identified, site marked, surgical consent, pre-op evaluation, timeout performed, IV checked, risks and benefits discussed and monitors and equipment checked Spinal Block Patient position: sitting Prep: Betadine Patient monitoring: continuous pulse ox and blood pressure Approach: midline Location: L2-3 Injection technique: single-shot Needle Needle type: Sprotte  Needle gauge: 24 G Assessment Sensory level: T4 Additional Notes Pt tolerated spinal well. Lot # [id number], expire 08/2014 . Negative heme, csfx3., - paresthesia

## 2013-07-14 NOTE — Anesthesia Postprocedure Evaluation (Signed)
  Anesthesia Post-op Note  Patient: [name] [name]  Procedure(s) Performed: Procedure(s) (LRB): LEFT TOTAL KNEE ARTHROPLASTY (Left)  Patient Location: PACU  Anesthesia Type: Spinal  Level of Consciousness: awake and alert   Airway and Oxygen Therapy: Patient Spontanous Breathing  Post-op Pain: mild  Post-op Assessment: Post-op Vital signs reviewed, Patient's Cardiovascular Status Stable, Respiratory Function Stable, Patent Airway and No signs of Nausea or vomiting  Last Vitals:  Filed Vitals:   07/14/13 1300  BP:   Pulse:   Temp: 36.4 C  Resp: 11    Post-op Vital Signs: stable   Complications: No apparent anesthesia complications

## 2013-07-14 NOTE — Transfer of Care (Addendum)
Immediate Anesthesia Transfer of Care Note  Patient: [name] [name]  Procedure(s) Performed: Procedure(s): LEFT TOTAL KNEE ARTHROPLASTY (Left)  Patient Location: PACU  Anesthesia Type:Spinal  Level of Consciousness: awake, alert , oriented and patient cooperative  Airway & Oxygen Therapy: Patient Spontanous Breathing and Patient connected to face mask oxygen  Post-op Assessment: Report given to PACU RN and Post -op Vital signs reviewed and stable  Post vital signs: stable  Complications: No apparent anesthesia complications   T 12 level spinal  level

## 2013-07-14 NOTE — Interval H&P Note (Signed)
History and Physical Interval Note:  07/14/2013 9:28 AM  [name] [name]  has presented today for surgery, with the diagnosis of OA LEFT KNEE   The various methods of treatment have been discussed with the patient and family. After consideration of risks, benefits and other options for treatment, the patient has consented to  Procedure(s): LEFT TOTAL KNEE ARTHROPLASTY (Left) as a surgical intervention .  The patient's history has been reviewed, patient examined, no change in status, stable for surgery.  I have reviewed the patient's chart and labs.  Questions were answered to the patient's satisfaction.     Shelda Pal

## 2013-07-14 NOTE — Op Note (Signed)
NAME:  [name] [name]                      MEDICAL RECORD NO.:  [id number]                             FACILITY:  [facility]      PHYSICIAN:  Madlyn Frankel. Charlann Boxer, M.D.  DATE OF BIRTH:  1950-09-28      DATE OF PROCEDURE:  07/14/2013                                     OPERATIVE REPORT         PREOPERATIVE DIAGNOSIS:  Left knee osteoarthritis.      POSTOPERATIVE DIAGNOSIS:  Left knee osteoarthritis.      FINDINGS:  The patient was noted to have complete loss of cartilage and   bone-on-bone arthritis with associated osteophytes in the medial and patellofemoral compartments of   the knee with a significant synovitis and associated effusion.      PROCEDURE:  Left total knee replacement.      COMPONENTS USED:  DePuy rotating platform posterior stabilized knee   system, a size 5 femur, 5 tibia, 10 mm PS insert, and 41 patellar   button.      SURGEON:  Madlyn Frankel. Charlann Boxer, M.D.      ASSISTANT:  Lanney Gins, PA-C.      ANESTHESIA:  Spinal.      SPECIMENS:  None.      COMPLICATION:  None.      DRAINS:  One Hemovac.  EBL: <100cc      TOURNIQUET TIME:   Total Tourniquet Time Documented: Thigh (Left) - 50 minutes Total: Thigh (Left) - 50 minutes  .      The patient was stable to the recovery room.      INDICATION FOR PROCEDURE:  [name] [name] is a 62 y.o. male patient of   mine.  The patient had been seen, evaluated, and treated conservatively in the   office with medication, activity modification, and injections.  The patient had   radiographic changes of bone-on-bone arthritis with endplate sclerosis and osteophytes noted.      The patient failed conservative measures including medication, injections, and activity modification, and at this point was ready for more definitive measures.   Based on the radiographic changes and failed conservative measures, the patient   decided to proceed with total knee replacement.  Risks of infection,   DVT, component failure, need for revision  surgery, postop course, and   expectations were all   discussed and reviewed.  Consent was obtained for benefit of pain   relief.      PROCEDURE IN DETAIL:  The patient was brought to the operative theater.   Once adequate anesthesia, preoperative antibiotics, 2 gm of Ancef administered, the patient was positioned supine with the left thigh tourniquet placed.  The  left lower extremity was prepped and draped in sterile fashion.  A time-   out was performed identifying the patient, planned procedure, and   extremity.      The left lower extremity was placed in the [facility] leg holder.  The leg was   exsanguinated, tourniquet elevated to 250 mmHg.  A midline incision was   made followed by median parapatellar arthrotomy.  Following initial   exposure, attention was first directed  to the patella.  Precut   measurement was noted to be 27 mm.  I resected down to 15 mm and used a   41 patellar button to restore patellar height as well as cover the cut   surface, lateral facet osteophytes were removed.      The lug holes were drilled and a metal shim was placed to protect the   patella from retractors and saw blades.      At this point, attention was now directed to the femur.  The femoral   canal was opened with a drill, irrigated to try to prevent fat emboli.  An   intramedullary rod was passed at 5 degrees valgus, 11 mm of bone was   resected off the distal femur due to pre-operative flexion contracture.  Following this resection, the tibia was   subluxated anteriorly.  Using the extramedullary guide, 10 mm of bone was resected off   the proximal lateral tibia.  We confirmed the gap would be   stable medially and laterally with a 10 mm insert as well as confirmed   the cut was perpendicular in the coronal plane, checking with an alignment rod.      Once this was done, I sized the femur to be a size 5 in the anterior-   posterior dimension, chose a standard component based on medial and    lateral dimension.  The size 5 rotation block was then pinned in   position anterior referenced using the C-clamp to set rotation.  The   anterior, posterior, and  chamfer cuts were made without difficulty nor   notching making certain that I was along the anterior cortex to help   with flexion gap stability.  I at this point removed large posterior osteophytes as well as 2 large ossified loose bodies,      The final box cut was made off the lateral aspect of distal femur.      At this point, the tibia was sized to be a size 5, the size 5 tray was   then pinned in position through the medial third of the tubercle,   drilled, and keel punched.  Trial reduction was now carried with a 5 femur,  5 tibia, a 10 mm PS insert, and the 41 patella botton.  The knee was brought to   extension, full extension with good flexion stability with the patella   tracking through the trochlea without application of pressure.  Given   all these findings, the trial components removed.  Final components were   opened and cement was mixed.  The knee was irrigated with normal saline   solution and pulse lavage.  The synovial lining was   then injected with 20cc Exparel,  0.25% Marcaine with epinephrine and 1 cc of Toradol,   total of 61 cc.      The knee was irrigated.  Final implants were then cemented onto clean and   dried cut surfaces of bone with the knee brought to extension with a 10 mm trial insert.      Once the cement had fully cured, the excess cement was removed   throughout the knee.  I confirmed I was satisfied with the range of   motion and stability, and the final 10 mm PS insert was chosen.  It was   placed into the knee.      The tourniquet had been let down at 49 minutes.  No significant   hemostasis required.  The medium Hemovac drain was placed deep.  The   extensor mechanism was then reapproximated using #1 Vicryl with the knee   in flexion.  The   remaining wound was closed with 2-0  Vicryl and running 4-0 Monocryl.   The knee was cleaned, dried, dressed sterilely using Dermabond and   Aquacel dressing.  Drain site dressed separately.  The patient was then   brought to recovery room in stable condition, tolerating the procedure   well.   Please note that Physician Assistant, Lanney Gins, was present for the entirety of the case, and was utilized for pre-operative positioning, peri-operative retractor management, general facilitation of the procedure.  He was also utilized for primary wound closure at the end of the case.              Madlyn Frankel Charlann Boxer, M.D.

## 2013-07-15 DIAGNOSIS — E663 Overweight: Secondary | ICD-10-CM

## 2013-07-15 DIAGNOSIS — D500 Iron deficiency anemia secondary to blood loss (chronic): Secondary | ICD-10-CM | POA: Diagnosis not present

## 2013-07-15 LAB — CBC
HCT: 37 % — ABNORMAL LOW (ref 39.0–52.0)
Hemoglobin: 12.6 g/dL — ABNORMAL LOW (ref 13.0–17.0)
MCH: 31.5 pg (ref 26.0–34.0)
MCHC: 34.1 g/dL (ref 30.0–36.0)
MCV: 92.5 fL (ref 78.0–100.0)
Platelets: 184 10*3/uL (ref 150–400)
RBC: 4 MIL/uL — ABNORMAL LOW (ref 4.22–5.81)
RDW: 12.6 % (ref 11.5–15.5)
WBC: 18.9 10*3/uL — ABNORMAL HIGH (ref 4.0–10.5)

## 2013-07-15 LAB — BASIC METABOLIC PANEL
BUN: 15 mg/dL (ref 6–23)
CO2: 23 mEq/L (ref 19–32)
Calcium: 8.7 mg/dL (ref 8.4–10.5)
Chloride: 104 mEq/L (ref 96–112)
Creatinine, Ser: 0.76 mg/dL (ref 0.50–1.35)
GFR calc Af Amer: >90 mL/min (ref >90)
GFR calc non Af Amer: >90 mL/min (ref >90)
Glucose, Bld: 133 mg/dL — ABNORMAL HIGH (ref 70–99)
Potassium: 3.8 mEq/L (ref 3.5–5.1)
Sodium: 136 mEq/L (ref 135–145)

## 2013-07-15 MED ORDER — DSS 100 MG PO CAPS: 100.0000 mg | ORAL_CAPSULE | Freq: Two times a day (BID) | ORAL | Status: DC

## 2013-07-15 MED ORDER — ASPIRIN 325 MG PO TBEC: 325.0000 mg | DELAYED_RELEASE_TABLET | Freq: Two times a day (BID) | ORAL | Status: AC

## 2013-07-15 MED ORDER — POLYETHYLENE GLYCOL 3350 17 G PO PACK: 17.0000 g | PACK | Freq: Two times a day (BID) | ORAL | Status: DC

## 2013-07-15 MED ORDER — METHOCARBAMOL 500 MG PO TABS: 500.0000 mg | ORAL_TABLET | Freq: Four times a day (QID) | ORAL | Status: DC | PRN

## 2013-07-15 MED ORDER — HYDROCODONE-ACETAMINOPHEN 7.5-325 MG PO TABS: 1.0000 | ORAL_TABLET | ORAL | Status: DC | PRN

## 2013-07-15 MED ORDER — FERROUS SULFATE 325 (65 FE) MG PO TABS: 325.0000 mg | ORAL_TABLET | Freq: Three times a day (TID) | ORAL | Status: DC

## 2013-07-15 NOTE — Progress Notes (Signed)
Physical Therapy Treatment Patient Details Name: [name] [name] MRN: [id number] DOB: [birth year] Today's Date: 07/15/2013 Time: 4098-1191 PT Time Calculation (min): 27 min  PT Assessment / Plan / Recommendation  History of Present Illness LTKA   PT Comments   POD # 1 assisted pt OOB to amb to stairs.  Pt amb with B crutches per pt request.  Performed steps then back to room to bathroom.  Pt c/o 10/10 pain so allowed time for rest and ICE.   Follow Up Recommendations  Home health PT     Does the patient have the potential to tolerate intense rehabilitation     Barriers to Discharge        Equipment Recommendations  Rolling walker with 5" wheels    Recommendations for Other Services    Frequency 7X/week   Progress towards PT Goals Progress towards PT goals: Progressing toward goals  Plan      Precautions / Restrictions Precautions Precautions: Fall;Knee Restrictions Weight Bearing Restrictions: No    Pertinent Vitals/Pain 10/10 Pre medicated ICE applied    Mobility  Bed Mobility Bed Mobility: Supine to Sit Supine to Sit: 5: Supervision Details for Bed Mobility Assistance: increased time Transfers Transfers: Sit to Stand;Stand to Sit Sit to Stand: 5: Supervision;From bed Stand to Sit: 5: Supervision;To chair/3-in-1 Details for Transfer Assistance: one VC for hand placement with stand to sit Ambulation/Gait Ambulation/Gait Assistance: 5: Supervision Ambulation Distance (Feet): 350 Feet Assistive device: Crutches Ambulation/Gait Assistance Details: 25% VC's on safety with B crutches and sequencing Gait Pattern: Step-through pattern;Step-to pattern;Antalgic Gait velocity: decreased Stairs: Yes Stairs Assistance: 4: Min guard;1: +1 Total assist Stairs Assistance Details (indicate cue type and reason): with spouse present for family education Stair Management Technique: One rail Left;With crutches;Forwards Number of Stairs: 4     PT Goals (current goals can now  be found in the care plan section) Acute Rehab PT Goals Patient Stated Goal: home  Visit Information  Last PT Received On: 07/15/13 Assistance Needed: +1 History of Present Illness: LTKA    Subjective Data  Patient Stated Goal: home   Cognition  Cognition Arousal/Alertness: Awake/alert Behavior During Therapy: WFL for tasks assessed/performed Overall Cognitive Status: Within Functional Limits for tasks assessed    Balance  Balance Balance Assessed: Yes Dynamic Standing Balance Dynamic Standing - Level of Assistance: 4: Min assist (min guard)  End of Session PT - End of Session Equipment Utilized During Treatment: Gait belt Activity Tolerance: Patient limited by pain Patient left: in chair;with call bell/phone within reach;with family/visitor present   Felecia Shelling  PTA [facility]  Acute  Rehab Pager      [phone]

## 2013-07-15 NOTE — Progress Notes (Signed)
Physical Therapy Treatment Patient Details Name: [name] [name] MRN: [id number] DOB: 02-Oct-1950 Today's Date: 07/15/2013 Time: 1031-1100 PT Time Calculation (min): 29 min  PT Assessment / Plan / Recommendation  History of Present Illness LTKA   PT Comments   POD # 1 pm session.  Performed and instructed on TKR TE's HEP.  Instructed on use of ICE and proper positioning.  Pt plans to D/C to home today.   Follow Up Recommendations  Home health PT     Does the patient have the potential to tolerate intense rehabilitation     Barriers to Discharge        Equipment Recommendations  Rolling walker with 5" wheels    Recommendations for Other Services    Frequency 7X/week   Progress towards PT Goals Progress towards PT goals: Progressing toward goals  Plan      Precautions / Restrictions Precautions Precautions: Fall;Knee Restrictions Weight Bearing Restrictions: No    Pertinent Vitals/Pain C/o 7/10 during TE's ICE applied    Mobility  Bed Mobility Bed Mobility: Supine to Sit;Sit to Supine Supine to Sit: 5: Supervision Sit to Supine: 5: Supervision Details for Bed Mobility Assistance: increased time Transfers Transfers: Sit to Stand;Stand to Sit Sit to Stand: 5: Supervision;From bed Stand to Sit: 5: Supervision;To bed Details for Transfer Assistance: increased time due to pain    Exercises   Total Knee Replacement TE's 10 reps B LE ankle pumps 10 reps knee presses 10 reps heel slides  10 reps SAQ's 10 reps SLR's 10 reps ABD Followed by ICE    PT Goals (current goals can now be found in the care plan section) Acute Rehab PT Goals Patient Stated Goal: home  Visit Information  Last PT Received On: 07/15/13 Assistance Needed: +1 History of Present Illness: LTKA    Subjective Data  Patient Stated Goal: home   Cognition  Cognition Arousal/Alertness: Awake/alert Behavior During Therapy: WFL for tasks assessed/performed Overall Cognitive Status: Within  Functional Limits for tasks assessed    Balance  Balance Balance Assessed: Yes Dynamic Standing Balance Dynamic Standing - Level of Assistance: 4: Min assist (min guard)  End of Session PT - End of Session Equipment Utilized During Treatment: Gait belt Activity Tolerance: Patient limited by pain Patient left: in bed;with call bell/phone within reach;with family/visitor present   Felecia Shelling  PTA [facility]  Acute  Rehab Pager      [phone]

## 2013-07-15 NOTE — Care Management Note (Signed)
    Page 1 of 2   07/15/2013     12:30:27 PM   CARE MANAGEMENT NOTE 07/15/2013  Patient:  [name],[name]   Account Number:  [id number]  Date Initiated:  07/14/2013  Documentation initiated by:  Colleen Can  Subjective/Objective Assessment:   DX LEFT KNEE OSTEOARTHRITIS; TOTAL KNEE REPLACEMNT     Action/Plan:   CM spoke with patient & spouse. Plans are for him to return to his home in [place] where spouse will be caregiver. He will need RW> Wants [facility] pt services.   Anticipated DC Date:  07/15/2013   Anticipated DC Plan:  HOME W HOME HEALTH SERVICES      DC Planning Services  CM consult      [facility] Choice  HOME HEALTH  DURABLE MEDICAL EQUIPMENT   Choice offered to / List presented to:  C-1 Patient   DME arranged  Levan Hurst      DME agency  [facility]     HH arranged  HH-2 PT      [facility] agency  [facility]   Status of service:  Completed, signed off Medicare Important Message given?   (If response is "NO", the following Medicare IM given date fields will be blank) Date Medicare IM given:   Date Additional Medicare IM given:    Discharge Disposition:  HOME W HOME HEALTH SERVICES  Per UR Regulation:  Reviewed for med. necessity/level of care/duration of stay  If discussed at Long Length of Stay Meetings, dates discussed:    Comments:  07/15/2013 Colleen Can BSN RN CCM [phone] Genevieve Norlander will provide [facility] services with start of services 07/16/2013

## 2013-07-15 NOTE — Progress Notes (Signed)
   Subjective: 1 Day Post-Op Procedure(s) (LRB): LEFT TOTAL KNEE ARTHROPLASTY (Left)   Patient reports pain as mild, pain well controlled with medication. No events throughout the night. Feels that he did well with PT yesterday. Ready to be discharged home.  Objective:   VITALS:   Filed Vitals:   07/15/13 0505  BP: 126/79  Pulse: 62  Temp: 97.8 F (36.6 C)  Resp: 16    Neurovascular intact Dorsiflexion/Plantar flexion intact Incision: dressing C/D/I No cellulitis present Compartment soft  LABS  Recent Labs  07/15/13 0407  HGB 12.6*  HCT 37.0*  WBC 18.9*  PLT 184     Recent Labs  07/15/13 0407  NA 136  K 3.8  BUN 15  CREATININE 0.76  GLUCOSE 133*     Assessment/Plan: 1 Day Post-Op Procedure(s) (LRB): LEFT TOTAL KNEE ARTHROPLASTY (Left) HV drain d/c'ed Foley cath d/c'ed Advance diet Up with therapy D/C IV fluids Discharge home with home health Follow up in 2 weeks at [facility]. Follow up with OLIN,Brandt Chaney D in 2 weeks.  Contact information:  [facility] [address], [address] [place] [address] [phone]    Exepected ABLA  Treated with iron and will observe  Overweight (BMI 25-29.9) Estimated body mass index is 29.15 kg/(m^2) as calculated from the following:   Height as of this encounter: 6' 1.5" (1.867 m).   Weight as of this encounter: 101.606 kg (224 lb). Patient also counseled that weight may inhibit the healing process Patient counseled that losing weight will help with future health issues      Anastasio Auerbach. Katiana Ruland   PAC  07/15/2013, 8:00 AM

## 2013-07-15 NOTE — Evaluation (Signed)
Occupational Therapy Evaluation Patient Details Name: [name] [name] MRN: [id number] DOB: 04-13-1951 Today's Date: 07/15/2013 Time: 1478-2956 OT Time Calculation (min): 23 min  OT Assessment / Plan / Recommendation History of present illness LTKA   Clinical Impression   Pt has assist at d/c with LB self care tasks. He did well with toilet transfer to comfort height. States his toilet may be a little lower at home so discussed use of 3in1 initially for safety. Per PTA, pt did well with stairs with crutches. Discussed similar shower transfer with pt as he doesn't have enough room to use walker in space near shower. So will need to use crutches. Reviewed technique verbally and pt feels he can manage with this task at home.     OT Assessment  Patient does not need any further OT services    Follow Up Recommendations  No OT follow up;Supervision/Assistance - 24 hour    Barriers to Discharge      Equipment Recommendations  None recommended by OT    Recommendations for Other Services    Frequency       Precautions / Restrictions Precautions Precautions: Fall;Knee Restrictions Weight Bearing Restrictions: No   Pertinent Vitals/Pain 5/10; reposition, ice    ADL  Eating/Feeding: Independent Where Assessed - Eating/Feeding: Chair Grooming: Wash/dry hands;Set up Where Assessed - Grooming: Supported sitting Upper Body Bathing: Chest;Right arm;Left arm;Abdomen;Set up Where Assessed - Upper Body Bathing: Unsupported sitting Lower Body Bathing: Minimal assistance Where Assessed - Lower Body Bathing: Supported sit to stand Upper Body Dressing: Set up Where Assessed - Upper Body Dressing: Unsupported sitting Lower Body Dressing: Minimal assistance Where Assessed - Lower Body Dressing: Supported sit to stand Toilet Transfer: Lobbyist: Comfort height toilet;Grab bars Toileting - Architect and Hygiene: Min guard Where Assessed - Medical sales representative and Hygiene: Sit to stand from 3-in-1 or toilet Equipment Used: Rolling walker ADL Comments: Pt states shower stall area is tight space and cant use walker to approach shower. Also the shower stall itself is very small so discussed making sure he feels ready to do a stand up shower and if needed to wait a bit. Will see how pt does  wtih stairs with PTA using crutches.  Addendum: spoke to pt after he practiced stairs using crutch with PTA and he states he feels comfortable with stepping over ledge using crutches. Reviewed technique with pt and wife.     OT Diagnosis:    OT Problem List:   OT Treatment Interventions:     OT Goals(Current goals can be found in the care plan section) Acute Rehab OT Goals Patient Stated Goal: home  Visit Information  Last OT Received On: 07/15/13 Assistance Needed: +1 History of Present Illness: LTKA       Prior Functioning     Home Living Family/patient expects to be discharged to:: Private residence Living Arrangements: Spouse/significant other Available Help at Discharge: Family Type of Home: House Home Access: Stairs to enter Secretary/administrator of Steps: 3 Entrance Stairs-Rails: None Home Layout: One level Home Equipment: Crutches;Bedside commode Prior Function Level of Independence: Independent Communication Communication: No difficulties         Vision/Perception     Cognition  Cognition Arousal/Alertness: Awake/alert Behavior During Therapy: WFL for tasks assessed/performed Overall Cognitive Status: Within Functional Limits for tasks assessed    Extremity/Trunk Assessment Upper Extremity Assessment Upper Extremity Assessment: Overall WFL for tasks assessed     Mobility Bed Mobility Bed Mobility: Supine to Sit;Sit  to Supine Supine to Sit: 5: Supervision Sit to Supine: 5: Supervision Details for Bed Mobility Assistance: increased time Transfers Transfers: Sit to Stand;Stand to Sit Sit to Stand: 5:  Supervision;From bed Stand to Sit: 5: Supervision;To bed Details for Transfer Assistance: increased time due to pain     Exercise     Balance Balance Balance Assessed: Yes Dynamic Standing Balance Dynamic Standing - Level of Assistance: 4: Min assist (min guard)   End of Session OT - End of Session Equipment Utilized During Treatment: Rolling walker Activity Tolerance: Patient tolerated treatment well Patient left: in chair;with call bell/phone within reach  GO     Lennox Laity [phone] 07/15/2013, 11:50 AM

## 2013-07-15 NOTE — Progress Notes (Signed)
[facility]  [facility] is providing the following services: RW  If patient discharges after hours, please call [phone].   Renard Hamper 07/15/2013, 10:38 AM

## 2013-07-16 NOTE — Discharge Summary (Signed)
Physician Discharge Summary  Patient ID: [name] [name] MRN: [id number] DOB/AGE: 1950/09/21 62 y.o.  Admit date: 07/14/2013 Discharge date: 07/15/2013   Procedures:  Procedure(s) (LRB): LEFT TOTAL KNEE ARTHROPLASTY (Left)  Attending Physician:  Dr. Durene Romans   Admission Diagnoses:   Left knee OA / pain  Discharge Diagnoses:  Principal Problem:   S/P left TKA Active Problems:   Exepected blood loss anemia   Overweight (BMI 25.0-29.9)  Past Medical History  Diagnosis Date  . Arthritis   . Hepatitis 2001    C -treated and no problems since    HPI: [name] [name], 62 y.o. male, has a history of pain and functional disability in the left knee due to arthritis and has failed non-surgical conservative treatments for greater than 12 weeks to includeNSAID's and/or analgesics, corticosteriod injections, viscosupplementation injections and activity modification. Onset of symptoms was gradual, starting >10 years ago with gradually worsening course since that time. The patient noted prior procedures on the knee to include arthroscopy on the left knee(s). Patient currently rates pain in the left knee(s) at 7 out of 10 with activity. Patient has night pain, worsening of pain with activity and weight bearing, pain that interferes with activities of daily living, pain with passive range of motion, crepitus and joint swelling. Patient has evidence of periarticular osteophytes and joint space narrowing by imaging studies. There is no active infection. Risks, benefits and expectations were discussed with the patient. Patient understand the risks, benefits and expectations and wishes to proceed with surgery.  PCP: Thora Lance, MD   Discharged Condition: good  Hospital Course:  Patient underwent the above stated procedure on 07/14/2013. Patient tolerated the procedure well and brought to the recovery room in good condition and subsequently to the floor.  POD #1 BP: 126/79 ; Pulse: 62 ;  Temp: 97.8 F (36.6 C) ; Resp: 16  Pt's foley was removed, as well as the hemovac drain removed. IV was changed to a saline lock. Patient reports pain as mild, pain well controlled with medication. No events throughout the night. Feels that he did well with PT yesterday. Ready to be discharged home. Neurovascular intact, dorsiflexion/plantar flexion intact, incision: dressing C/D/I, no cellulitis present and compartment soft.   LABS  Basename    HGB  12.6  HCT  37.0    Discharge Exam: General appearance: alert, cooperative and no distress Extremities: Homans sign is negative, no sign of DVT, no edema, redness or tenderness in the calves or thighs and no ulcers, gangrene or trophic changes  Disposition:   Home-Health Care Svc with follow up in 2 weeks   Follow-up Information   Follow up with Shelda Pal, MD. Schedule an appointment as soon as possible for a visit in 2 weeks.   Specialty:  Orthopedic Surgery   Contact information:   [address] [place] [address] [phone]       Discharge Orders   Future Orders Complete By Expires   Call MD / Call 911  As directed    Comments:     If you experience chest pain or shortness of breath, CALL 911 and be transported to the hospital emergency room.  If you develope a fever above 101 F, pus (white drainage) or increased drainage or redness at the wound, or calf pain, call your surgeon's office.   Change dressing  As directed    Comments:     Maintain surgical dressing for 10-14 days, then change the dressing daily with sterile  4 x 4 inch gauze dressing and tape. Keep the area dry and clean.   Constipation Prevention  As directed    Comments:     Drink plenty of fluids.  Prune juice may be helpful.  You may use a stool softener, such as Colace (over the counter) 100 mg twice a day.  Use MiraLax (over the counter) for constipation as needed.   Diet - low sodium heart healthy  As directed    Discharge  instructions  As directed    Comments:     Maintain surgical dressing for 10-14 days, then replace with gauze and tape. Keep the area dry and clean until follow up. Follow up in 2 weeks at [facility]. Call with any questions or concerns.   Increase activity slowly as tolerated  As directed    TED hose  As directed    Comments:     Use stockings (TED hose) for 2 weeks on both leg(s).  You may remove them at night for sleeping.   Weight bearing as tolerated  As directed    Questions:     Laterality:     Extremity:          Medication List    STOP taking these medications       clindamycin 150 MG capsule  Commonly known as:  CLEOCIN     ibuprofen 200 MG tablet  Commonly known as:  ADVIL,MOTRIN     meloxicam 7.5 MG tablet  Commonly known as:  MOBIC      TAKE these medications       aspirin 325 MG EC tablet  Take 1 tablet (325 mg total) by mouth 2 (two) times daily.     DSS 100 MG Caps  Take 100 mg by mouth 2 (two) times daily.     ferrous sulfate 325 (65 FE) MG tablet  Take 1 tablet (325 mg total) by mouth 3 (three) times daily after meals.     HYDROcodone-acetaminophen 7.5-325 MG per tablet  Commonly known as:  NORCO  Take 1-2 tablets by mouth every 4 (four) hours as needed for pain.     loratadine 10 MG tablet  Commonly known as:  CLARITIN  Take 10 mg by mouth daily.     methocarbamol 500 MG tablet  Commonly known as:  ROBAXIN  Take 1 tablet (500 mg total) by mouth every 6 (six) hours as needed (muscle spasms).     polyethylene glycol packet  Commonly known as:  MIRALAX / GLYCOLAX  Take 17 g by mouth 2 (two) times daily.         Signed: Anastasio Auerbach. Emmily Pellegrin   PAC  07/16/2013, 9:09 AM

## 2013-11-07 ENCOUNTER — Other Ambulatory Visit: Payer: Self-pay | Admitting: Gastroenterology

## 2014-07-08 NOTE — H&P (Signed)
TOTAL KNEE ADMISSION H&P  Patient is being admitted for right total knee arthroplasty.  Subjective:  Chief Complaint:    Right knee primary OA / pain.  HPI: [name] [name], 63 y.o. male, has a history of pain and functional disability in the right knee due to arthritis and has failed non-surgical conservative treatments for greater than 12 weeks to include NSAID's and/or analgesics, corticosteriod injections, use of assistive devices and activity modification.  Onset of symptoms was gradual, starting 6+ months ago with rapidlly worsening course since that time. The patient noted prior procedures on the knee to include  arthroscopy and menisectomy on the right knee(s).  Patient currently rates pain in the right knee(s) at 6 out of 10 with activity. Patient has night pain, worsening of pain with activity and weight bearing, pain that interferes with activities of daily living, pain with passive range of motion, crepitus and joint swelling.  Patient has evidence of periarticular osteophytes and joint space narrowing by imaging studies.   There is no active infection.  Risks, benefits and expectations were discussed with the patient.  Risks including but not limited to the risk of anesthesia, blood clots, nerve damage, blood vessel damage, failure of the prosthesis, infection and up to and including death.  Patient understand the risks, benefits and expectations and wishes to proceed with surgery.   PCP: Simona Huh, MD  D/C Plans:      Home with HHPT  Post-op Meds:       No Rx given   Tranexamic Acid:      To be given - IV    Decadron:      is to be given  FYI:     ASA post-op  Oxycodone post-op    Patient Active Problem List   Diagnosis Date Noted  . Exepected blood loss anemia 07/15/2013  . Overweight (BMI 25.0-29.9) 07/15/2013  . S/P left TKA 07/14/2013   Past Medical History  Diagnosis Date  . Arthritis   . Hepatitis 2001    C -treated and no problems since    Past Surgical  History  Procedure Laterality Date  . Tonsillectomy      as child  . Left ankle surgery      fractured  . Left shoulder      torn ligaments left shoulder  . Left knee surgery      removed ligaments  . Total knee arthroplasty Left 07/14/2013    Procedure: LEFT TOTAL KNEE ARTHROPLASTY;  Surgeon: Mauri Pole, MD;  Location: [facility];  Service: Orthopedics;  Laterality: Left;    No prescriptions prior to admission   Allergies  Allergen Reactions  . Statins Other (See Comments)    Muscle pain    History  Substance Use Topics  . Smoking status: Former Smoker    Quit date: 04/30/2012  . Smokeless tobacco: Not on file  . Alcohol Use: Yes     Comment: occassionally       Review of Systems  Constitutional: Negative.   HENT: Negative.   Eyes: Negative.   Respiratory: Positive for shortness of breath (on exertion).   Cardiovascular: Negative.   Gastrointestinal: Negative.   Genitourinary: Negative.   Musculoskeletal: Positive for joint pain.  Skin: Negative.   Neurological: Negative.   Endo/Heme/Allergies: Negative.   Psychiatric/Behavioral: Negative.     Objective:  Physical Exam  Constitutional: He is oriented to person, place, and time. He appears well-developed and well-nourished.  HENT:  Head: Normocephalic and atraumatic.  Eyes:  Pupils are equal, round, and reactive to light.  Neck: Neck supple. No JVD present. No tracheal deviation present. No thyromegaly present.  Cardiovascular: Normal rate, regular rhythm, normal heart sounds and intact distal pulses.   Respiratory: Effort normal and breath sounds normal. No stridor. No respiratory distress. He has no wheezes.  GI: Soft. There is no tenderness. There is no guarding.  Musculoskeletal:       Right knee: He exhibits decreased range of motion, swelling and bony tenderness. He exhibits no ecchymosis, no deformity, no laceration and no erythema. Tenderness found.  Lymphadenopathy:    He has no cervical adenopathy.   Neurological: He is alert and oriented to person, place, and time.  Skin: Skin is warm and dry.  Psychiatric: He has a normal mood and affect.      Labs:  Estimated body mass index is 29.18 kg/(m^2) as calculated from the following:   Height as of 07/14/13: 6' 1.5" (1.867 m).   Weight as of 07/07/13: 101.696 kg (224 lb 3.2 oz).   Imaging Review Plain radiographs demonstrate severe degenerative joint disease of the right knee(s). The overall alignment is neutral. The bone quality appears to be good for age and reported activity level.  Assessment/Plan:  End stage arthritis, right knee   The patient history, physical examination, clinical judgment of the provider and imaging studies are consistent with end stage degenerative joint disease of the right knee(s) and total knee arthroplasty is deemed medically necessary. The treatment options including medical management, injection therapy arthroscopy and arthroplasty were discussed at length. The risks and benefits of total knee arthroplasty were presented and reviewed. The risks due to aseptic loosening, infection, stiffness, patella tracking problems, thromboembolic complications and other imponderables were discussed. The patient acknowledged the explanation, agreed to proceed with the plan and consent was signed. Patient is being admitted for inpatient treatment for surgery, pain control, PT, OT, prophylactic antibiotics, VTE prophylaxis, progressive ambulation and ADL's and discharge planning. The patient is planning to be discharged home with home health services.     West Pugh Naziya Hegwood   PA-C  07/08/2014, 2:53 PM

## 2014-07-10 ENCOUNTER — Encounter (HOSPITAL_COMMUNITY): Payer: Self-pay | Admitting: Pharmacy Technician

## 2014-07-14 ENCOUNTER — Encounter (HOSPITAL_COMMUNITY): Payer: Self-pay

## 2014-07-16 ENCOUNTER — Encounter (HOSPITAL_COMMUNITY)
Admission: RE | Admit: 2014-07-16 | Discharge: 2014-07-16 | Disposition: A | Discharge status: Home / Self Care | Payer: BC Managed Care – PPO | Source: Ambulatory Visit | Attending: Orthopedic Surgery | Admitting: Orthopedic Surgery

## 2014-07-16 ENCOUNTER — Encounter (HOSPITAL_COMMUNITY): Payer: Self-pay

## 2014-07-16 DIAGNOSIS — Z01812 Encounter for preprocedural laboratory examination: Secondary | ICD-10-CM | POA: Insufficient documentation

## 2014-07-16 HISTORY — DX: Headache: R51

## 2014-07-16 HISTORY — DX: Reserved for inherently not codable concepts without codable children: IMO0001

## 2014-07-16 HISTORY — DX: Pain, unspecified: R52

## 2014-07-16 HISTORY — DX: Trigger finger, unspecified finger: M65.30

## 2014-07-16 HISTORY — DX: Cerebral infarction, unspecified: I63.9

## 2014-07-16 LAB — URINALYSIS, ROUTINE W REFLEX MICROSCOPIC
Bilirubin Urine: NEGATIVE
Glucose, UA: NEGATIVE mg/dL
Hgb urine dipstick: NEGATIVE
Ketones, ur: NEGATIVE mg/dL
Leukocytes, UA: NEGATIVE
Nitrite: NEGATIVE
Protein, ur: NEGATIVE mg/dL
Specific Gravity, Urine: 1.009 (ref 1.005–1.030)
Urobilinogen, UA: 0.2 mg/dL (ref 0.0–1.0)
pH: 6 (ref 5.0–8.0)

## 2014-07-16 LAB — CBC
HCT: 47.2 % (ref 39.0–52.0)
Hemoglobin: 16.3 g/dL (ref 13.0–17.0)
MCH: 32.7 pg (ref 26.0–34.0)
MCHC: 34.5 g/dL (ref 30.0–36.0)
MCV: 94.8 fL (ref 78.0–100.0)
Platelets: 202 10*3/uL (ref 150–400)
RBC: 4.98 MIL/uL (ref 4.22–5.81)
RDW: 12.6 % (ref 11.5–15.5)
WBC: 8 10*3/uL (ref 4.0–10.5)

## 2014-07-16 LAB — BASIC METABOLIC PANEL
Anion gap: 13 (ref 5–15)
BUN: 18 mg/dL (ref 6–23)
CO2: 24 mEq/L (ref 19–32)
Calcium: 9.6 mg/dL (ref 8.4–10.5)
Chloride: 98 mEq/L (ref 96–112)
Creatinine, Ser: 0.83 mg/dL (ref 0.50–1.35)
GFR calc Af Amer: >90 mL/min (ref >90)
GFR calc non Af Amer: >90 mL/min (ref >90)
Glucose, Bld: 133 mg/dL — ABNORMAL HIGH (ref 70–99)
Potassium: 4.3 mEq/L (ref 3.7–5.3)
Sodium: 135 mEq/L — ABNORMAL LOW (ref 137–147)

## 2014-07-16 LAB — PROTIME-INR
INR: 1.03 (ref 0.00–1.49)
Prothrombin Time: 13.7 seconds (ref 11.6–15.2)

## 2014-07-16 LAB — SURGICAL PCR SCREEN
MRSA, PCR: NEGATIVE
Staphylococcus aureus: NEGATIVE

## 2014-07-16 LAB — APTT: aPTT: 29 seconds (ref 24–37)

## 2014-07-16 NOTE — Pre-Procedure Instructions (Signed)
EKG AND CXR NOT NEEDED PER ANESTHESIA GUIDELINES

## 2014-07-16 NOTE — Pre-Procedure Instructions (Signed)
CHART GIVEN TO FOLLOW UP NURSE SHARON SHOFFNER FOR REVIEW OF ALL PREOP TEST RESULTS.

## 2014-07-16 NOTE — Progress Notes (Signed)
   07/16/14 0823  OBSTRUCTIVE SLEEP APNEA  Have you ever been diagnosed with sleep apnea through a sleep study? No  Do you snore loudly (loud enough to be heard through closed doors)?  1  Do you often feel tired, fatigued, or sleepy during the daytime? 0  Has anyone observed you stop breathing during your sleep? 0  Do you have, or are you being treated for high blood pressure? 0  BMI more than 35 kg/m2? 0  Age over 61 years old? 1  Neck circumference greater than 40 cm/16 inches? 1  Gender: 1  Obstructive Sleep Apnea Score 4

## 2014-07-16 NOTE — Patient Instructions (Addendum)
YOUR SURGERY IS SCHEDULED AT [facility]  ON:  Tuesday  November 10  REPORT TO  SHORT STAY CENTER AT:  6:00 AM   DO NOT EAT OR DRINK ANYTHING AFTER MIDNIGHT THE NIGHT BEFORE YOUR SURGERY.  YOU MAY BRUSH YOUR TEETH, RINSE OUT YOUR MOUTH--BUT NO WATER, NO FOOD, NO CHEWING GUM, NO MINTS, NO CANDIES, NO CHEWING TOBACCO.  PLEASE TAKE THE FOLLOWING MEDICATIONS THE AM OF YOUR SURGERY WITH A FEW SIPS OF WATER:  MAY WEAR NICOTINE PATCH    DO NOT BRING VALUABLES, MONEY, CREDIT CARDS.  DO NOT WEAR JEWELRY, MAKE-UP, NAIL POLISH AND NO METAL PINS OR CLIPS IN YOUR HAIR. CONTACT LENS, DENTURES / PARTIALS, GLASSES SHOULD NOT BE WORN TO SURGERY AND IN MOST CASES-HEARING AIDS WILL NEED TO BE REMOVED.  BRING YOUR GLASSES CASE, ANY EQUIPMENT NEEDED FOR YOUR CONTACT LENS. FOR PATIENTS ADMITTED TO THE HOSPITAL--CHECK OUT TIME THE DAY OF DISCHARGE IS 11:00 AM.  ALL INPATIENT ROOMS ARE PRIVATE - WITH BATHROOM, TELEPHONE, TELEVISION AND WIFI INTERNET.    PLEASE BE AWARE THAT YOU MAY NEED ADDITIONAL BLOOD DRAWN DAY OF YOUR SURGERY  _______________________________________________________________________   [facility] - Preparing for Surgery Before surgery, you can play an important role.  Because skin is not sterile, your skin needs to be as free of germs as possible.  You can reduce the number of germs on your skin by washing with CHG (chlorahexidine gluconate) soap before surgery.  CHG is an antiseptic cleaner which kills germs and bonds with the skin to continue killing germs even after washing. Please DO NOT use if you have an allergy to CHG or antibacterial soaps.  If your skin becomes reddened/irritated stop using the CHG and inform your nurse when you arrive at Short Stay. Do not shave (including legs and underarms) for at least 48 hours prior to the first CHG shower.  You may shave your face/neck. Please follow these instructions carefully:  1.  Shower with CHG Soap the night before  surgery and the  morning of Surgery.  2.  If you choose to wash your hair, wash your hair first as usual with your  normal  shampoo.  3.  After you shampoo, rinse your hair and body thoroughly to remove the  shampoo.                           4.  Use CHG as you would any other liquid soap.  You can apply chg directly  to the skin and wash                       Gently with a scrungie or clean washcloth.  5.  Apply the CHG Soap to your body ONLY FROM THE NECK DOWN.   Do not use on face/ open                           Wound or open sores. Avoid contact with eyes, ears mouth and genitals (private parts).                       Wash face,  Genitals (private parts) with your normal soap.             6.  Wash thoroughly, paying special attention to the area where your surgery  will be performed.  7.  Thoroughly rinse your body  with warm water from the neck down.  8.  DO NOT shower/wash with your normal soap after using and rinsing off  the CHG Soap.                9.  Pat yourself dry with a clean towel.            10.  Wear clean pajamas.            11.  Place clean sheets on your bed the night of your first shower and do not  sleep with pets. Day of Surgery : Do not apply any lotions/deodorants the morning of surgery.  Please wear clean clothes to the hospital/surgery center.  FAILURE TO FOLLOW THESE INSTRUCTIONS MAY RESULT IN THE CANCELLATION OF YOUR SURGERY PATIENT SIGNATURE_________________________________  NURSE SIGNATURE__________________________________  ________________________________________________________________________   Adam Phenix  An incentive spirometer is a tool that can help keep your lungs clear and active. This tool measures how well you are filling your lungs with each breath. Taking long deep breaths may help reverse or decrease the chance of developing breathing (pulmonary) problems (especially infection) following:  A long period of time when you are unable to  move or be active. BEFORE THE PROCEDURE   If the spirometer includes an indicator to show your best effort, your nurse or respiratory therapist will set it to a desired goal.  If possible, sit up straight or lean slightly forward. Try not to slouch.  Hold the incentive spirometer in an upright position. INSTRUCTIONS FOR USE   Sit on the edge of your bed if possible, or sit up as far as you can in bed or on a chair.  Hold the incentive spirometer in an upright position.  Breathe out normally.  Place the mouthpiece in your mouth and seal your lips tightly around it.  Breathe in slowly and as deeply as possible, raising the piston or the ball toward the top of the column.  Hold your breath for 3-5 seconds or for as long as possible. Allow the piston or ball to fall to the bottom of the column.  Remove the mouthpiece from your mouth and breathe out normally.  Rest for a few seconds and repeat Steps 1 through 7 at least 10 times every 1-2 hours when you are awake. Take your time and take a few normal breaths between deep breaths.  The spirometer may include an indicator to show your best effort. Use the indicator as a goal to work toward during each repetition.  After each set of 10 deep breaths, practice coughing to be sure your lungs are clear. If you have an incision (the cut made at the time of surgery), support your incision when coughing by placing a pillow or rolled up towels firmly against it. Once you are able to get out of bed, walk around indoors and cough well. You may stop using the incentive spirometer when instructed by your caregiver.  RISKS AND COMPLICATIONS  Take your time so you do not get dizzy or light-headed.  If you are in pain, you may need to take or ask for pain medication before doing incentive spirometry. It is harder to take a deep breath if you are having pain. AFTER USE  Rest and breathe slowly and easily.  It can be helpful to keep track of a log of  your progress. Your caregiver can provide you with a simple table to help with this. If you are using the spirometer at home, follow  these instructions: SEEK MEDICAL CARE IF:   You are having difficultly using the spirometer.  You have trouble using the spirometer as often as instructed.  Your pain medication is not giving enough relief while using the spirometer.  You develop fever of 100.5 F (38.1 C) or higher. SEEK IMMEDIATE MEDICAL CARE IF:   You cough up bloody sputum that had not been present before.  You develop fever of 102 F (38.9 C) or greater.  You develop worsening pain at or near the incision site. MAKE SURE YOU:   Understand these instructions.  Will watch your condition.  Will get help right away if you are not doing well or get worse. Document Released: 01/08/2007 Document Revised: 11/20/2011 Document Reviewed: 03/11/2007 Exit[employer] Patient Information 2014 [employer], [place].   ________________________________________________________________________  WHAT IS A BLOOD TRANSFUSION? Blood Transfusion Information  A transfusion is the replacement of blood or some of its parts. Blood is made up of multiple cells which provide different functions.  Red blood cells carry oxygen and are used for blood loss replacement.  White blood cells fight against infection.  Platelets control bleeding.  Plasma helps clot blood.  Other blood products are available for specialized needs, such as hemophilia or other clotting disorders. BEFORE THE TRANSFUSION  Who gives blood for transfusions?   Healthy volunteers who are fully evaluated to make sure their blood is safe. This is blood bank blood. Transfusion therapy is the safest it has ever been in the practice of medicine. Before blood is taken from a donor, a complete history is taken to make sure that person has no history of diseases nor engages in risky social behavior (examples are intravenous drug use or sexual activity  with multiple partners). The donor's travel history is screened to minimize risk of transmitting infections, such as malaria. The donated blood is tested for signs of infectious diseases, such as HIV and hepatitis. The blood is then tested to be sure it is compatible with you in order to minimize the chance of a transfusion reaction. If you or a relative donates blood, this is often done in anticipation of surgery and is not appropriate for emergency situations. It takes many days to process the donated blood. RISKS AND COMPLICATIONS Although transfusion therapy is very safe and saves many lives, the main dangers of transfusion include:   Getting an infectious disease.  Developing a transfusion reaction. This is an allergic reaction to something in the blood you were given. Every precaution is taken to prevent this. The decision to have a blood transfusion has been considered carefully by your caregiver before blood is given. Blood is not given unless the benefits outweigh the risks. AFTER THE TRANSFUSION  Right after receiving a blood transfusion, you will usually feel much better and more energetic. This is especially true if your red blood cells have gotten low (anemic). The transfusion raises the level of the red blood cells which carry oxygen, and this usually causes an energy increase.  The nurse administering the transfusion will monitor you carefully for complications. HOME CARE INSTRUCTIONS  No special instructions are needed after a transfusion. You may find your energy is better. Speak with your caregiver about any limitations on activity for underlying diseases you may have. SEEK MEDICAL CARE IF:   Your condition is not improving after your transfusion.  You develop redness or irritation at the intravenous (IV) site. SEEK IMMEDIATE MEDICAL CARE IF:  Any of the following symptoms occur over the next 12 hours:  Shaking chills.  You have a temperature by mouth above 102 F (38.9  C), not controlled by medicine.  Chest, back, or muscle pain.  People around you feel you are not acting correctly or are confused.  Shortness of breath or difficulty breathing.  Dizziness and fainting.  You get a rash or develop hives.  You have a decrease in urine output.  Your urine turns a dark color or changes to pink, red, or brown. Any of the following symptoms occur over the next 10 days:  You have a temperature by mouth above 102 F (38.9 C), not controlled by medicine.  Shortness of breath.  Weakness after normal activity.  The white part of the eye turns yellow (jaundice).  You have a decrease in the amount of urine or are urinating less often.  Your urine turns a dark color or changes to pink, red, or brown. Document Released: 08/25/2000 Document Revised: 11/20/2011 Document Reviewed: 04/13/2008 [facility] Patient Information 2014 [place], [place].  _______________________________________________________________________

## 2014-07-21 ENCOUNTER — Inpatient Hospital Stay (HOSPITAL_COMMUNITY)
Admission: RE | Admit: 2014-07-21 | Discharge: 2014-07-22 | DRG: 470 | Disposition: A | Discharge status: Home Health | Payer: BC Managed Care – PPO | Source: Ambulatory Visit | Attending: Orthopedic Surgery | Admitting: Orthopedic Surgery

## 2014-07-21 ENCOUNTER — Encounter (HOSPITAL_COMMUNITY): Payer: Self-pay | Admitting: *Deleted

## 2014-07-21 ENCOUNTER — Encounter (HOSPITAL_COMMUNITY)
Admission: RE | Disposition: A | Discharge status: Home Health | Payer: Self-pay | Source: Ambulatory Visit | Attending: Orthopedic Surgery

## 2014-07-21 ENCOUNTER — Ambulatory Visit (HOSPITAL_COMMUNITY): Payer: BC Managed Care – PPO | Admitting: *Deleted

## 2014-07-21 DIAGNOSIS — M659 Synovitis and tenosynovitis, unspecified: Secondary | ICD-10-CM | POA: Diagnosis present

## 2014-07-21 DIAGNOSIS — Z96659 Presence of unspecified artificial knee joint: Secondary | ICD-10-CM

## 2014-07-21 DIAGNOSIS — M1711 Unilateral primary osteoarthritis, right knee: Principal | ICD-10-CM | POA: Diagnosis present

## 2014-07-21 DIAGNOSIS — Z01812 Encounter for preprocedural laboratory examination: Secondary | ICD-10-CM | POA: Diagnosis not present

## 2014-07-21 DIAGNOSIS — E663 Overweight: Secondary | ICD-10-CM | POA: Diagnosis present

## 2014-07-21 DIAGNOSIS — Z6829 Body mass index (BMI) 29.0-29.9, adult: Secondary | ICD-10-CM

## 2014-07-21 DIAGNOSIS — M25561 Pain in right knee: Secondary | ICD-10-CM | POA: Diagnosis present

## 2014-07-21 DIAGNOSIS — Z96651 Presence of right artificial knee joint: Secondary | ICD-10-CM

## 2014-07-21 HISTORY — PX: TOTAL KNEE ARTHROPLASTY: SHX125

## 2014-07-21 LAB — TYPE AND SCREEN
ABO/RH(D): O NEG
Antibody Screen: NEGATIVE

## 2014-07-21 SURGERY — ARTHROPLASTY, KNEE, TOTAL
Anesthesia: Spinal | Site: Knee | Laterality: Right

## 2014-07-21 MED ORDER — BUPIVACAINE HCL (PF) 0.75 % IJ SOLN
INTRAMUSCULAR | Status: DC | PRN
  Administered 2014-07-21: 15 mg

## 2014-07-21 MED ORDER — ALUM & MAG HYDROXIDE-SIMETH 200-200-20 MG/5ML PO SUSP: 30.0000 mL | ORAL | Status: DC | PRN

## 2014-07-21 MED ORDER — LACTATED RINGERS IV SOLN
INTRAVENOUS | Status: DC | PRN
  Administered 2014-07-21 (×3): via INTRAVENOUS

## 2014-07-21 MED ORDER — SODIUM CHLORIDE 0.9 % IJ SOLN
INTRAMUSCULAR | Status: DC | PRN
  Administered 2014-07-21: 30 mL via INTRAVENOUS

## 2014-07-21 MED ORDER — METOCLOPRAMIDE HCL 10 MG PO TABS: 5.0000 mg | ORAL_TABLET | Freq: Three times a day (TID) | ORAL | Status: DC | PRN

## 2014-07-21 MED ORDER — BUPIVACAINE-EPINEPHRINE (PF) 0.25% -1:200000 IJ SOLN
INTRAMUSCULAR | Status: DC | PRN
  Administered 2014-07-21: 30 mL

## 2014-07-21 MED ORDER — CEFAZOLIN SODIUM-DEXTROSE 2-3 GM-% IV SOLR
2.0000 g | INTRAVENOUS | Status: AC
  Administered 2014-07-21: 2 g via INTRAVENOUS

## 2014-07-21 MED ORDER — PROPOFOL 10 MG/ML IV BOLUS
INTRAVENOUS | Status: AC
  Filled 2014-07-21: qty 20

## 2014-07-21 MED ORDER — MAGNESIUM CITRATE PO SOLN: 1.0000 | Freq: Once | ORAL | Status: AC | PRN

## 2014-07-21 MED ORDER — CHLORHEXIDINE GLUCONATE 4 % EX LIQD: 60.0000 mL | Freq: Once | CUTANEOUS | Status: DC

## 2014-07-21 MED ORDER — MIDAZOLAM HCL 2 MG/2ML IJ SOLN
INTRAMUSCULAR | Status: AC
  Filled 2014-07-21: qty 2

## 2014-07-21 MED ORDER — CEFAZOLIN SODIUM-DEXTROSE 2-3 GM-% IV SOLR
INTRAVENOUS | Status: AC
  Filled 2014-07-21: qty 50

## 2014-07-21 MED ORDER — KETOROLAC TROMETHAMINE 30 MG/ML IJ SOLN
INTRAMUSCULAR | Status: AC
  Filled 2014-07-21: qty 1

## 2014-07-21 MED ORDER — BUPIVACAINE-EPINEPHRINE (PF) 0.25% -1:200000 IJ SOLN
INTRAMUSCULAR | Status: AC
  Filled 2014-07-21: qty 30

## 2014-07-21 MED ORDER — DOCUSATE SODIUM 100 MG PO CAPS
100.0000 mg | ORAL_CAPSULE | Freq: Two times a day (BID) | ORAL | Status: DC
  Administered 2014-07-21 – 2014-07-22 (×3): 100 mg via ORAL

## 2014-07-21 MED ORDER — HYDROMORPHONE HCL 1 MG/ML IJ SOLN
0.5000 mg | INTRAMUSCULAR | Status: DC | PRN
  Administered 2014-07-21: 2 mg via INTRAVENOUS
  Administered 2014-07-21 – 2014-07-22 (×5): 1 mg via INTRAVENOUS
  Filled 2014-07-21 (×5): qty 1
  Filled 2014-07-21: qty 2

## 2014-07-21 MED ORDER — CELECOXIB 200 MG PO CAPS
200.0000 mg | ORAL_CAPSULE | Freq: Two times a day (BID) | ORAL | Status: DC
  Administered 2014-07-21 – 2014-07-22 (×3): 200 mg via ORAL
  Filled 2014-07-21 (×4): qty 1

## 2014-07-21 MED ORDER — MIDAZOLAM HCL 5 MG/5ML IJ SOLN
INTRAMUSCULAR | Status: DC | PRN
  Administered 2014-07-21: 1 mg via INTRAVENOUS
  Administered 2014-07-21 (×2): 0.5 mg via INTRAVENOUS
  Administered 2014-07-21: 2 mg via INTRAVENOUS

## 2014-07-21 MED ORDER — OXYCODONE HCL 5 MG PO TABS
5.0000 mg | ORAL_TABLET | ORAL | Status: DC | PRN
Start: 2014-07-21 — End: 2014-07-22
  Administered 2014-07-21 (×2): 15 mg via ORAL
  Administered 2014-07-21: 10 mg via ORAL
  Administered 2014-07-21: 5 mg via ORAL
  Administered 2014-07-22 (×3): 15 mg via ORAL
  Filled 2014-07-21: qty 2
  Filled 2014-07-21 (×3): qty 3
  Filled 2014-07-21: qty 1
  Filled 2014-07-21 (×2): qty 3

## 2014-07-21 MED ORDER — PROMETHAZINE HCL 25 MG/ML IJ SOLN: 6.2500 mg | INTRAMUSCULAR | Status: DC | PRN

## 2014-07-21 MED ORDER — DEXAMETHASONE SODIUM PHOSPHATE 10 MG/ML IJ SOLN
10.0000 mg | Freq: Once | INTRAMUSCULAR | Status: AC
  Administered 2014-07-22: 10 mg via INTRAVENOUS
  Filled 2014-07-21: qty 1

## 2014-07-21 MED ORDER — POLYETHYLENE GLYCOL 3350 17 G PO PACK
17.0000 g | PACK | Freq: Two times a day (BID) | ORAL | Status: DC
  Administered 2014-07-22: 17 g via ORAL

## 2014-07-21 MED ORDER — LORATADINE 10 MG PO TABS
10.0000 mg | ORAL_TABLET | Freq: Every day | ORAL | Status: DC
  Filled 2014-07-21 (×2): qty 1

## 2014-07-21 MED ORDER — BUPIVACAINE IN DEXTROSE 0.75-8.25 % IT SOLN
INTRATHECAL | Status: DC | PRN
  Administered 2014-07-21: 2 mL via INTRATHECAL

## 2014-07-21 MED ORDER — POTASSIUM CHLORIDE 2 MEQ/ML IV SOLN
INTRAVENOUS | Status: DC
  Administered 2014-07-21: 13:00:00 via INTRAVENOUS
  Filled 2014-07-21 (×5): qty 1000

## 2014-07-21 MED ORDER — FENTANYL CITRATE 0.05 MG/ML IJ SOLN
INTRAMUSCULAR | Status: DC | PRN
  Administered 2014-07-21 (×2): 50 ug via INTRAVENOUS

## 2014-07-21 MED ORDER — SODIUM CHLORIDE 0.9 % IR SOLN
Status: DC | PRN
  Administered 2014-07-21: 1000 mL

## 2014-07-21 MED ORDER — PROPOFOL INFUSION 10 MG/ML OPTIME
INTRAVENOUS | Status: DC | PRN
  Administered 2014-07-21: 75 ug/kg/min via INTRAVENOUS

## 2014-07-21 MED ORDER — LIDOCAINE HCL (CARDIAC) 20 MG/ML IV SOLN
INTRAVENOUS | Status: AC
  Filled 2014-07-21: qty 5

## 2014-07-21 MED ORDER — METOCLOPRAMIDE HCL 5 MG/ML IJ SOLN: 5.0000 mg | Freq: Three times a day (TID) | INTRAMUSCULAR | Status: DC | PRN

## 2014-07-21 MED ORDER — PROPOFOL INFUSION 10 MG/ML OPTIME
INTRAVENOUS | Status: DC | PRN
  Administered 2014-07-21 (×2): 10 mL via INTRAVENOUS

## 2014-07-21 MED ORDER — ONDANSETRON HCL 4 MG/2ML IJ SOLN: 4.0000 mg | Freq: Four times a day (QID) | INTRAMUSCULAR | Status: DC | PRN

## 2014-07-21 MED ORDER — OXYCODONE HCL 5 MG PO TABS: 5.0000 mg | ORAL_TABLET | Freq: Once | ORAL | Status: DC | PRN

## 2014-07-21 MED ORDER — FERROUS SULFATE 325 (65 FE) MG PO TABS
325.0000 mg | ORAL_TABLET | Freq: Three times a day (TID) | ORAL | Status: DC
  Administered 2014-07-22: 325 mg via ORAL
  Filled 2014-07-21 (×6): qty 1

## 2014-07-21 MED ORDER — MENTHOL 3 MG MT LOZG
1.0000 | LOZENGE | OROMUCOSAL | Status: DC | PRN
  Filled 2014-07-21: qty 9

## 2014-07-21 MED ORDER — DEXTROSE 5 % IV SOLN
500.0000 mg | Freq: Four times a day (QID) | INTRAVENOUS | Status: DC | PRN
  Administered 2014-07-21: 500 mg via INTRAVENOUS
  Filled 2014-07-21 (×2): qty 5

## 2014-07-21 MED ORDER — LIDOCAINE HCL (CARDIAC) 20 MG/ML IV SOLN
INTRAVENOUS | Status: DC | PRN
  Administered 2014-07-21: 50 mg via INTRAVENOUS

## 2014-07-21 MED ORDER — DEXAMETHASONE SODIUM PHOSPHATE 10 MG/ML IJ SOLN
10.0000 mg | Freq: Once | INTRAMUSCULAR | Status: AC
  Administered 2014-07-21: 10 mg via INTRAVENOUS

## 2014-07-21 MED ORDER — ONDANSETRON HCL 4 MG PO TABS: 4.0000 mg | ORAL_TABLET | Freq: Four times a day (QID) | ORAL | Status: DC | PRN

## 2014-07-21 MED ORDER — DIPHENHYDRAMINE HCL 25 MG PO CAPS: 25.0000 mg | ORAL_CAPSULE | Freq: Four times a day (QID) | ORAL | Status: DC | PRN

## 2014-07-21 MED ORDER — LACTATED RINGERS IV SOLN: INTRAVENOUS | Status: DC

## 2014-07-21 MED ORDER — KETOROLAC TROMETHAMINE 30 MG/ML IJ SOLN
INTRAMUSCULAR | Status: DC | PRN
  Administered 2014-07-21: 30 mg via INTRAVENOUS

## 2014-07-21 MED ORDER — HYDROMORPHONE HCL 1 MG/ML IJ SOLN: 0.2500 mg | INTRAMUSCULAR | Status: DC | PRN

## 2014-07-21 MED ORDER — DEXAMETHASONE SODIUM PHOSPHATE 10 MG/ML IJ SOLN
INTRAMUSCULAR | Status: AC
  Filled 2014-07-21: qty 1

## 2014-07-21 MED ORDER — ONDANSETRON HCL 4 MG/2ML IJ SOLN
INTRAMUSCULAR | Status: DC | PRN
  Administered 2014-07-21: 4 mg via INTRAVENOUS

## 2014-07-21 MED ORDER — PHENOL 1.4 % MT LIQD
1.0000 | OROMUCOSAL | Status: DC | PRN
  Filled 2014-07-21: qty 177

## 2014-07-21 MED ORDER — CEFAZOLIN SODIUM-DEXTROSE 2-3 GM-% IV SOLR
2.0000 g | Freq: Four times a day (QID) | INTRAVENOUS | Status: AC
  Administered 2014-07-21 (×2): 2 g via INTRAVENOUS
  Filled 2014-07-21 (×2): qty 50

## 2014-07-21 MED ORDER — 0.9 % SODIUM CHLORIDE (POUR BTL) OPTIME
TOPICAL | Status: DC | PRN
  Administered 2014-07-21: 1000 mL

## 2014-07-21 MED ORDER — NICOTINE 14 MG/24HR TD PT24
14.0000 mg | MEDICATED_PATCH | Freq: Every day | TRANSDERMAL | Status: DC
  Administered 2014-07-21 – 2014-07-22 (×2): 14 mg via TRANSDERMAL
  Filled 2014-07-21 (×2): qty 1

## 2014-07-21 MED ORDER — TRANEXAMIC ACID 100 MG/ML IV SOLN
1000.0000 mg | Freq: Once | INTRAVENOUS | Status: AC
  Administered 2014-07-21: 1000 mg via INTRAVENOUS
  Filled 2014-07-21: qty 10

## 2014-07-21 MED ORDER — SODIUM CHLORIDE 0.9 % IJ SOLN
INTRAMUSCULAR | Status: AC
  Filled 2014-07-21: qty 50

## 2014-07-21 MED ORDER — FENTANYL CITRATE 0.05 MG/ML IJ SOLN
INTRAMUSCULAR | Status: AC
  Filled 2014-07-21: qty 2

## 2014-07-21 MED ORDER — OXYCODONE HCL 5 MG/5ML PO SOLN: 5.0000 mg | Freq: Once | ORAL | Status: DC | PRN

## 2014-07-21 MED ORDER — HYDROCODONE-ACETAMINOPHEN 7.5-325 MG PO TABS: 1.0000 | ORAL_TABLET | ORAL | Status: DC

## 2014-07-21 MED ORDER — BISACODYL 10 MG RE SUPP: 10.0000 mg | Freq: Every day | RECTAL | Status: DC | PRN

## 2014-07-21 MED ORDER — ASPIRIN EC 325 MG PO TBEC
325.0000 mg | DELAYED_RELEASE_TABLET | Freq: Two times a day (BID) | ORAL | Status: DC
  Administered 2014-07-22: 325 mg via ORAL
  Filled 2014-07-21 (×3): qty 1

## 2014-07-21 MED ORDER — METHOCARBAMOL 500 MG PO TABS
500.0000 mg | ORAL_TABLET | Freq: Four times a day (QID) | ORAL | Status: DC | PRN
  Administered 2014-07-21 – 2014-07-22 (×2): 500 mg via ORAL
  Filled 2014-07-21 (×2): qty 1

## 2014-07-21 SURGICAL SUPPLY — 55 items
ADH SKN CLS APL DERMABOND .7 (GAUZE/BANDAGES/DRESSINGS) ×1
BAG SPEC THK2 15X12 ZIP CLS (MISCELLANEOUS)
BAG ZIPLOCK 12X15 (MISCELLANEOUS) IMPLANT
BANDAGE ELASTIC 6 VELCRO ST LF (GAUZE/BANDAGES/DRESSINGS) ×3 IMPLANT
BANDAGE ESMARK 6X9 LF (GAUZE/BANDAGES/DRESSINGS) ×1 IMPLANT
BLADE SAW SGTL 13.0X1.19X90.0M (BLADE) ×3 IMPLANT
BNDG CMPR 9X6 STRL LF SNTH (GAUZE/BANDAGES/DRESSINGS) ×1
BNDG ESMARK 6X9 LF (GAUZE/BANDAGES/DRESSINGS) ×3
BOWL SMART MIX CTS (DISPOSABLE) ×3 IMPLANT
CAPT RP KNEE ×2 IMPLANT
CEMENT HV SMART SET (Cement) ×4 IMPLANT
CUFF TOURN SGL QUICK 34 (TOURNIQUET CUFF) ×3
CUFF TRNQT CYL 34X4X40X1 (TOURNIQUET CUFF) ×1 IMPLANT
DECANTER SPIKE VIAL GLASS SM (MISCELLANEOUS) ×3 IMPLANT
DERMABOND ADVANCED (GAUZE/BANDAGES/DRESSINGS) ×2
DERMABOND ADVANCED .7 DNX12 (GAUZE/BANDAGES/DRESSINGS) IMPLANT
DRAPE EXTREMITY TIBURON (DRAPES) ×3 IMPLANT
DRAPE POUCH INSTRU U-SHP 10X18 (DRAPES) ×3 IMPLANT
DRAPE U-SHAPE 47X51 STRL (DRAPES) ×3 IMPLANT
DRSG AQUACEL AG ADV 3.5X10 (GAUZE/BANDAGES/DRESSINGS) ×3 IMPLANT
DURAPREP 26ML APPLICATOR (WOUND CARE) ×6 IMPLANT
ELECT REM PT RETURN 9FT ADLT (ELECTROSURGICAL) ×3
ELECTRODE REM PT RTRN 9FT ADLT (ELECTROSURGICAL) ×1 IMPLANT
FACESHIELD WRAPAROUND (MASK) ×15 IMPLANT
FACESHIELD WRAPAROUND OR TEAM (MASK) ×5 IMPLANT
GLOVE BIOGEL PI IND STRL 7.5 (GLOVE) ×1 IMPLANT
GLOVE BIOGEL PI IND STRL 8.5 (GLOVE) ×1 IMPLANT
GLOVE BIOGEL PI INDICATOR 7.5 (GLOVE) ×2
GLOVE BIOGEL PI INDICATOR 8.5 (GLOVE) ×2
GLOVE ECLIPSE 8.0 STRL XLNG CF (GLOVE) ×3 IMPLANT
GLOVE ORTHO TXT STRL SZ7.5 (GLOVE) ×6 IMPLANT
GOWN SPEC L3 XXLG W/TWL (GOWN DISPOSABLE) ×3 IMPLANT
GOWN STRL REUS W/TWL LRG LVL3 (GOWN DISPOSABLE) ×3 IMPLANT
HANDPIECE INTERPULSE COAX TIP (DISPOSABLE) ×3
KIT BASIN OR (CUSTOM PROCEDURE TRAY) ×3 IMPLANT
LIQUID BAND (GAUZE/BANDAGES/DRESSINGS) ×3 IMPLANT
MANIFOLD NEPTUNE II (INSTRUMENTS) ×3 IMPLANT
NDL SAFETY ECLIPSE 18X1.5 (NEEDLE) ×1 IMPLANT
NEEDLE HYPO 18GX1.5 SHARP (NEEDLE) ×3
PACK TOTAL JOINT (CUSTOM PROCEDURE TRAY) ×3 IMPLANT
POSITIONER SURGICAL ARM (MISCELLANEOUS) ×3 IMPLANT
SET HNDPC FAN SPRY TIP SCT (DISPOSABLE) ×1 IMPLANT
SET PAD KNEE POSITIONER (MISCELLANEOUS) ×3 IMPLANT
SUCTION FRAZIER 12FR DISP (SUCTIONS) ×3 IMPLANT
SUT MNCRL AB 4-0 PS2 18 (SUTURE) ×3 IMPLANT
SUT VIC AB 1 CT1 36 (SUTURE) ×3 IMPLANT
SUT VIC AB 2-0 CT1 27 (SUTURE) ×9
SUT VIC AB 2-0 CT1 TAPERPNT 27 (SUTURE) ×3 IMPLANT
SUT VLOC 180 0 24IN GS25 (SUTURE) ×3 IMPLANT
SYR 50ML LL SCALE MARK (SYRINGE) ×3 IMPLANT
TOWEL OR 17X26 10 PK STRL BLUE (TOWEL DISPOSABLE) ×3 IMPLANT
TOWEL OR NON WOVEN STRL DISP B (DISPOSABLE) IMPLANT
TRAY FOLEY BAG SILVER LF 16FR (CATHETERS) ×2 IMPLANT
WATER STERILE IRR 1500ML POUR (IV SOLUTION) ×3 IMPLANT
WRAP KNEE MAXI GEL POST OP (GAUZE/BANDAGES/DRESSINGS) ×3 IMPLANT

## 2014-07-21 NOTE — Anesthesia Postprocedure Evaluation (Signed)
  Anesthesia Post-op Note  Patient: [name] [name]  Procedure(s) Performed: Procedure(s): RIGHT TOTAL KNEE ARTHROPLASTY (Right)  Patient Location: PACU  Anesthesia Type:Spinal  Level of Consciousness: awake, alert  and oriented  Airway and Oxygen Therapy: Patient Spontanous Breathing  Post-op Pain: none  Post-op Assessment: Post-op Vital signs reviewed  Post-op Vital Signs: Reviewed  Last Vitals:  Filed Vitals:   07/21/14 1230  BP: 115/70  Pulse: 64  Temp: 36.5 C  Resp: 12    Complications: No apparent anesthesia complications

## 2014-07-21 NOTE — Interval H&P Note (Signed)
History and Physical Interval Note:  07/21/2014 6:23 AM  [name]  has presented today for surgery, with the diagnosis of right knee osteoarthritis  The various methods of treatment have been discussed with the patient and family. After consideration of risks, benefits and other options for treatment, the patient has consented to  Procedure(s): RIGHT TOTAL KNEE ARTHROPLASTY (Right) as a surgical intervention .  The patient's history has been reviewed, patient examined, no change in status, stable for surgery.  I have reviewed the patient's chart and labs.  Questions were answered to the patient's satisfaction.     Mauri Pole

## 2014-07-21 NOTE — Anesthesia Procedure Notes (Signed)
Spinal Patient location during procedure: OR Start time: 07/21/2014 8:39 AM Staffing Resident/CRNA: Sherian Maroon A Performed by: resident/CRNA  Preanesthetic Checklist Completed: patient identified, site marked, surgical consent, pre-op evaluation, timeout performed, IV checked, risks and benefits discussed and monitors and equipment checked Spinal Block Patient position: sitting Prep: Betadine Patient monitoring: heart rate, cardiac monitor, continuous pulse ox and blood pressure Approach: midline Location: L4-5 Needle Needle type: Sprotte  Needle gauge: 24 G Needle length: 9 cm Needle insertion depth: 5 cm Assessment Sensory level: T10

## 2014-07-21 NOTE — Op Note (Signed)
NAME:  [name] [name]                      MEDICAL RECORD NO.:  [id number]                             FACILITY:  [facility]      PHYSICIAN:  Pietro Cassis. Alvan Dame, M.D.  DATE OF BIRTH:  August 19, 1951      DATE OF PROCEDURE:  07/21/2014                                     OPERATIVE REPORT         PREOPERATIVE DIAGNOSIS:  Right knee osteoarthritis.      POSTOPERATIVE DIAGNOSIS:  Right knee osteoarthritis.      FINDINGS:  The patient was noted to have complete loss of cartilage and   bone-on-bone arthritis with associated osteophytes in all three compartments of   the knee with a significant synovitis and associated effusion.      PROCEDURE:  Right total knee replacement.      COMPONENTS USED:  De[employer] Sigma rotating platform posterior stabilized knee   system, a size 5 femur, 5 tibia, 10 mm PS insert, and 41 patellar   button.      SURGEON:  Pietro Cassis. Alvan Dame, M.D.      ASSISTANT:  Nehemiah Massed, PA-C.      ANESTHESIA:  Spinal.      SPECIMENS:  None.      COMPLICATION:  None.      DRAINS:  None.  EBL: <100cc      TOURNIQUET TIME:   Total Tourniquet Time Documented: Thigh (Right) - 38 minutes Total: Thigh (Right) - 38 minutes  .      The patient was stable to the recovery room.      INDICATION FOR PROCEDURE:  [name] [name] is a 63 y.o. male patient of   mine.  The patient had been seen, evaluated, and treated conservatively in the   office with medication, activity modification, and injections.  The patient had   radiographic changes of bone-on-bone arthritis with endplate sclerosis and osteophytes noted.      The patient failed conservative measures including medication, injections, and activity modification, and at this point was ready for more definitive measures.   Based on the radiographic changes and failed conservative measures, the patient   decided to proceed with total knee replacement.  Risks of infection,   DVT, component failure, need for revision surgery, postop  course, and   expectations were all   discussed and reviewed.  Consent was obtained for benefit of pain   relief.      PROCEDURE IN DETAIL:  The patient was brought to the operative theater.   Once adequate anesthesia, preoperative antibiotics, 2 gm of Ancef administered, the patient was positioned supine with the right thigh tourniquet placed.  The  right lower extremity was prepped and draped in sterile fashion.  A time-   out was performed identifying the patient, planned procedure, and   extremity.      The right lower extremity was placed in the [facility] leg holder.  The leg was   exsanguinated, tourniquet elevated to 250 mmHg.  A midline incision was   made followed by median parapatellar arthrotomy.  Following initial   exposure, attention was first directed to the  patella.  Precut   measurement was noted to be 26 mm.  I resected down to 15 mm and used a   41 patellar button to restore patellar height as well as cover the cut   surface.      The lug holes were drilled and a metal shim was placed to protect the   patella from retractors and saw blades.      At this point, attention was now directed to the femur.  The femoral   canal was opened with a drill, irrigated to try to prevent fat emboli.  An   intramedullary rod was passed at 5 degrees valgus, 10 mm of bone was   resected off the distal femur.  Following this resection, the tibia was   subluxated anteriorly.  Using the extramedullary guide, 10 mm of bone was resected off   the proximal lateral tibia.  We confirmed the gap would be   stable medially and laterally with a 10 mm insert as well as confirmed   the cut was perpendicular in the coronal plane, checking with an alignment rod.      Once this was done, I sized the femur to be a size 5 in the anterior-   posterior dimension, chose a standard component based on medial and   lateral dimension.  The size 5 rotation block was then pinned in   position anterior  referenced using the C-clamp to set rotation.  The   anterior, posterior, and  chamfer cuts were made without difficulty nor   notching making certain that I was along the anterior cortex to help   with flexion gap stability.      The final box cut was made off the lateral aspect of distal femur.      At this point, the tibia was sized to be a size 5, the size 5 tray was   then pinned in position through the medial third of the tubercle,   drilled, and keel punched.  Trial reduction was now carried with a 5 femur,  5 tibia, a 10 mm insert, and the 41 patella botton.  The knee was brought to   extension, full extension with good flexion stability with the patella   tracking through the trochlea without application of pressure.  Given   all these findings, the trial components removed.  Final components were   opened and cement was mixed.  The knee was irrigated with normal saline   solution and pulse lavage.  The synovial lining was   then injected with 30cc of 0.25% Marcaine with epinephrine, 1 cc of Toradol and 30cc of NS for a   total of 61 cc.      The knee was irrigated.  Final implants were then cemented onto clean and   dried cut surfaces of bone with the knee brought to extension with a 10 mm trial insert.      Once the cement had fully cured, the excess cement was removed   throughout the knee.  I confirmed I was satisfied with the range of   motion and stability, and the final 10 mm PS insert was chosen.  It was   placed into the knee.      The tourniquet had been let down at 38 minutes.  No significant   hemostasis required.  The   extensor mechanism was then reapproximated using #1 Vicryl with the knee   in flexion.  The   remaining wound was closed  with 2-0 Vicryl and running 4-0 Monocryl.   The knee was cleaned, dried, dressed sterilely using Dermabond and   Aquacel dressing.  The patient was then   brought to recovery room in stable condition, tolerating the procedure    well.   Please note that Physician Assistant, Nehemiah Massed, PA-C, was present for the entirety of the case, and was utilized for pre-operative positioning, peri-operative retractor management, general facilitation of the procedure.  He was also utilized for primary wound closure at the end of the case.              Pietro Cassis Alvan Dame, M.D.    07/21/2014 10:14 AM

## 2014-07-21 NOTE — Anesthesia Preprocedure Evaluation (Addendum)
Anesthesia Evaluation  Patient identified by MRN, date of birth, ID band Patient awake    Reviewed: Allergy & Precautions, H&P , NPO status , Patient's Chart, lab work & pertinent test results  Airway Mallampati: II  TM Distance: >3 FB Neck ROM: Full    Dental  (+) Teeth Intact, Dental Advisory Given   Pulmonary former smoker,          Cardiovascular negative cardio ROS      Neuro/Psych negative neurological ROS  negative psych ROS   GI/Hepatic (+) Hepatitis -, C  Endo/Other  negative endocrine ROS  Renal/GU negative Renal ROS     Musculoskeletal  (+) Arthritis -,   Abdominal   Peds  Hematology negative hematology ROS (+)   Anesthesia Other Findings   Reproductive/Obstetrics                            Anesthesia Physical Anesthesia Plan  ASA: II  Anesthesia Plan: Spinal   Post-op Pain Management:    Induction: Intravenous  Airway Management Planned: Simple Face Mask and Natural Airway  Additional Equipment:   Intra-op Plan:   Post-operative Plan:   Informed Consent: I have reviewed the patients History and Physical, chart, labs and discussed the procedure including the risks, benefits and alternatives for the proposed anesthesia with the patient or authorized representative who has indicated his/her understanding and acceptance.     Plan Discussed with: CRNA and Surgeon  Anesthesia Plan Comments:         Anesthesia Quick Evaluation

## 2014-07-21 NOTE — Evaluation (Signed)
Physical Therapy Evaluation Patient Details Name: [name] [name] MRN: [id number] DOB: 1951/01/12 Today's Date: 07/21/2014   History of Present Illness  RTKA  Clinical Impression  Patient experiencing a great deal of pain but willing to walk a short distance. pateint will benefit from PT to address problems listed in note below    Follow Up Recommendations Home health PT;Supervision/Assistance - 24 hour    Equipment Recommendations  None recommended by PT    Recommendations for Other Services       Precautions / Restrictions Precautions Precautions: Knee;Fall      Mobility  Bed Mobility Overal bed mobility: Needs Assistance Bed Mobility: Supine to Sit;Sit to Supine     Supine to sit: Min assist Sit to supine: Min assist   General bed mobility comments: support R leg  Transfers Overall transfer level: Needs assistance Equipment used: Rolling walker (2 wheeled) Transfers: Sit to/from Stand Sit to Stand: +2 safety/equipment;Min assist;From elevated surface         General transfer comment: cues for position of UE and Rleg  Ambulation/Gait Ambulation/Gait assistance: Min assist;+2 safety/equipment Ambulation Distance (Feet): 30 Feet Assistive device: Rolling walker (2 wheeled)       General Gait Details: c/o dizziness, returned to bed after 30'  Stairs            Wheelchair Mobility    Modified Rankin (Stroke Patients Only)       Balance                                             Pertinent Vitals/Pain Pain Assessment: 0-10 Pain Score: 7  Pain Descriptors / Indicators: Cramping;Discomfort;Grimacing;Tightness Pain Intervention(s): Premedicated before session;Patient requesting pain meds-RN notified;Ice applied;Repositioned    Home Living Family/patient expects to be discharged to:: Private residence   Available Help at Discharge: Family Type of Home: House Home Access: Stairs to enter Entrance Stairs-Rails:  None Entrance Stairs-Number of Steps: 3 Home Layout: One level Home Equipment: Environmental consultant - 2 wheels;Cane - single point;Crutches      Prior Function Level of Independence: Independent               Hand Dominance        Extremity/Trunk Assessment               Lower Extremity Assessment: RLE deficits/detail RLE Deficits / Details: support for lifting leg, knee flexion to 60       Communication   Communication: No difficulties  Cognition Arousal/Alertness: Awake/alert Behavior During Therapy: WFL for tasks assessed/performed Overall Cognitive Status: Within Functional Limits for tasks assessed                      General Comments      Exercises        Assessment/Plan    PT Assessment Patient needs continued PT services  PT Diagnosis Difficulty walking;Acute pain   PT Problem List Decreased strength;Decreased range of motion;Decreased activity tolerance;Decreased mobility;Decreased knowledge of precautions;Decreased safety awareness;Pain  PT Treatment Interventions DME instruction;Gait training;Stair training;Functional mobility training;Therapeutic activities;Therapeutic exercise;Patient/family education   PT Goals (Current goals can be found in the Care Plan section) Acute Rehab PT Goals Patient Stated Goal: to walk without pain PT Goal Formulation: With patient Time For Goal Achievement: 07/23/14 Potential to Achieve Goals: Good    Frequency 7X/week   Barriers to discharge  Co-evaluation               End of Session   Activity Tolerance: Treatment limited secondary to medical complications (Comment) Patient left: in bed;with call bell/phone within reach;with nursing/sitter in room Nurse Communication: Mobility status         Time: 6060-0459 PT Time Calculation (min) (ACUTE ONLY): 15 min   Charges:   PT Evaluation $Initial PT Evaluation Tier I: 1 Procedure PT Treatments $Gait Training: 8-22 mins   PT G Codes:           Claretha Cooper 07/21/2014, 6:09 PM

## 2014-07-21 NOTE — Plan of Care (Signed)
Problem: Consults Goal: Total Joint Replacement Patient Education See Patient Education Module for education specifics. Right total knee Goal: Diagnosis- Total Joint Replacement Right total knee  Problem: Phase I Progression Outcomes Goal: CMS/Neurovascular status WDL Outcome: Completed/Met Date Met:  07/21/14 Goal: Pain controlled with appropriate interventions Outcome: Progressing Goal: Dangle or out of bed evening of surgery Outcome: Progressing Goal: Initial discharge plan identified Outcome: Completed/Met Date Met:  07/21/14 Goal: Hemodynamically stable Outcome: Completed/Met Date Met:  07/21/14

## 2014-07-21 NOTE — Care Management Note (Signed)
    Page 1 of 2   07/21/2014     2:39:18 PM CARE MANAGEMENT NOTE 07/21/2014  Patient:  [name] [name],[name] [name]   Account Number:  [id number]  Date Initiated:  07/21/2014  Documentation initiated by:  [facility]  Subjective/Objective Assessment:   adm: RIGHT TOTAL KNEE ARTHROPLASTY (Right)     Action/Plan:   discharge planning   Anticipated DC Date:  07/22/2014   Anticipated DC Plan:  [place]  CM consult      [facility] Choice  HOME HEALTH   Choice offered to / List presented to:  C-1 Patient   DME arranged  NA      DME agency  NA     [place] arranged  HH-2 PT      [place]   Status of service:  Completed, signed off Medicare Important Message given?   (If response is "NO", the following Medicare IM given date fields will be blank) Date Medicare IM given:   Medicare IM given by:   Date Additional Medicare IM given:   Additional Medicare IM given by:    Discharge Disposition:  [place]  Per UR Regulation:    If discussed at Long Length of Stay Meetings, dates discussed:    Comments:  07/21/14 14:30 CM met with pt in room to offer choice of home health agency.  Pt chooses [facility] to render HHPT.  Pt has rolling walker and has sink and cabinet in bathrom (for support) and states he does not need 3n1.  Addrress and contact information verified with pt with mobile phone as best for contact.  Wife in room and is supportive. Referral texted to [employer], Stanton Kidney.  No other CM needs were comminicated.  Mariane Masters, BSN, CM [phone].

## 2014-07-21 NOTE — Plan of Care (Signed)
Problem: Phase II Progression Outcomes Goal: Tolerating diet Outcome: Completed/Met Date Met:  07/21/14     

## 2014-07-21 NOTE — Transfer of Care (Signed)
Immediate Anesthesia Transfer of Care Note  Patient: [name] [name]  Procedure(s) Performed: Procedure(s): RIGHT TOTAL KNEE ARTHROPLASTY (Right)  Patient Location: PACU  Anesthesia Type:Spinal  Level of Consciousness: awake, alert , oriented and patient cooperative  Airway & Oxygen Therapy: Patient Spontanous Breathing and Patient connected to face mask oxygen  Post-op Assessment: Report given to PACU RN and Post -op Vital signs reviewed and stable  Post vital signs: Reviewed and stable  Complications: No apparent anesthesia complications

## 2014-07-22 ENCOUNTER — Encounter (HOSPITAL_COMMUNITY): Payer: Self-pay | Admitting: Orthopedic Surgery

## 2014-07-22 LAB — CBC
HCT: 42.4 % (ref 39.0–52.0)
Hemoglobin: 14.4 g/dL (ref 13.0–17.0)
MCH: 32.1 pg (ref 26.0–34.0)
MCHC: 34 g/dL (ref 30.0–36.0)
MCV: 94.4 fL (ref 78.0–100.0)
Platelets: 231 10*3/uL (ref 150–400)
RBC: 4.49 MIL/uL (ref 4.22–5.81)
RDW: 12.3 % (ref 11.5–15.5)
WBC: 20.2 10*3/uL — ABNORMAL HIGH (ref 4.0–10.5)

## 2014-07-22 LAB — BASIC METABOLIC PANEL
Anion gap: 12 (ref 5–15)
BUN: 16 mg/dL (ref 6–23)
CO2: 26 mEq/L (ref 19–32)
Calcium: 9.5 mg/dL (ref 8.4–10.5)
Chloride: 102 mEq/L (ref 96–112)
Creatinine, Ser: 0.91 mg/dL (ref 0.50–1.35)
GFR calc Af Amer: >90 mL/min (ref >90)
GFR calc non Af Amer: 90 mL/min — ABNORMAL LOW (ref >90)
Glucose, Bld: 146 mg/dL — ABNORMAL HIGH (ref 70–99)
Potassium: 4.6 mEq/L (ref 3.7–5.3)
Sodium: 140 mEq/L (ref 137–147)

## 2014-07-22 MED ORDER — METHOCARBAMOL 500 MG PO TABS: 500.0000 mg | ORAL_TABLET | Freq: Four times a day (QID) | ORAL | Status: DC | PRN

## 2014-07-22 MED ORDER — FERROUS SULFATE 325 (65 FE) MG PO TABS: 325.0000 mg | ORAL_TABLET | Freq: Three times a day (TID) | ORAL | Status: DC

## 2014-07-22 MED ORDER — ASPIRIN 325 MG PO TBEC: 325.0000 mg | DELAYED_RELEASE_TABLET | Freq: Two times a day (BID) | ORAL | Status: AC

## 2014-07-22 MED ORDER — POLYETHYLENE GLYCOL 3350 17 G PO PACK: 17.0000 g | PACK | Freq: Two times a day (BID) | ORAL | Status: DC

## 2014-07-22 MED ORDER — DSS 100 MG PO CAPS
100.0000 mg | ORAL_CAPSULE | Freq: Two times a day (BID) | ORAL | Status: DC
Start: 2014-07-22 — End: 2015-01-19

## 2014-07-22 MED ORDER — OXYCODONE HCL 5 MG PO TABS: 5.0000 mg | ORAL_TABLET | ORAL | Status: DC | PRN

## 2014-07-22 NOTE — Progress Notes (Signed)
Physical Therapy Treatment Patient Details Name: [name] [name] MRN: [id number] DOB: Jul 20, 1951 Today's Date: 07/22/2014    History of Present Illness RTKA    PT Comments    Ready for Dc.   Follow Up Recommendations  Home health PT;Supervision/Assistance - 24 hour     Equipment Recommendations       Recommendations for Other Services       Precautions / Restrictions Precautions Precautions: Knee;Fall    Mobility  Bed Mobility Overal bed mobility: Modified Independent                Transfers Overall transfer level: Needs assistance Equipment used: Rolling walker (2 wheeled);Crutches Transfers: Sit to/from Stand Sit to Stand: Supervision         General transfer comment: cues for safely sitting with crutches, tends to not have Ad in hand  Ambulation/Gait Ambulation/Gait assistance: Supervision Ambulation Distance (Feet): 200 Feet Assistive device: Rolling walker (2 wheeled);Crutches Gait Pattern/deviations: Step-through pattern     General Gait Details: cues for safety with crutches   Stairs Stairs: Yes Stairs assistance: Supervision Stair Management: Step to pattern;Forwards;With crutches Number of Stairs: 2 General stair comments: cues for safety  Wheelchair Mobility    Modified Rankin (Stroke Patients Only)       Balance                                    Cognition Arousal/Alertness: Awake/alert Behavior During Therapy: Impulsive                        Exercises Total Joint Exercises Quad Sets: AROM;Both;15 reps;Supine Short Arc Quad: AROM;Right;10 reps;Supine Heel Slides: Right;10 reps;Supine;AROM Hip ABduction/ADduction: Right;10 reps;AROM Straight Leg Raises: AROM;Right;10 reps;Supine Goniometric ROM: 10-60    General Comments        Pertinent Vitals/Pain Pain Score: 2  Pain Descriptors / Indicators: Aching Pain Intervention(s): Ice applied;Monitored during session;Premedicated before  session    Home Living                      Prior Function            PT Goals (current goals can now be found in the care plan section) Progress towards PT goals: Progressing toward goals    Frequency       PT Plan Current plan remains appropriate    Co-evaluation             End of Session   Activity Tolerance: Patient tolerated treatment well Patient left: with family/visitor present     Time: 1220-1250 PT Time Calculation (min) (ACUTE ONLY): 30 min  Charges:  $Gait Training: 8-22 mins $Therapeutic Exercise: 23-37 mins                    G Codes:      Claretha Cooper 07/22/2014, 1:45 PM

## 2014-07-22 NOTE — Progress Notes (Signed)
OT Cancellation Note  Patient Details Name: [name] [name] MRN: [id number] DOB: 1950-12-23   Cancelled Treatment:     Pt had other knee done in past year.  No needs per PT. Will sign off. Wilmar Prabhakar  .mews 07/22/2014, 7:33 AM  Lesle Chris, OTR/L [phone] 07/22/2014

## 2014-07-22 NOTE — Progress Notes (Signed)
Physical Therapy Treatment Patient Details Name: Vineet Kinney MRN: [id number] DOB: 19-Dec-1950 Today's Date: 07/22/2014    History of Present Illness RTKA    PT Comments    Feels less pain. Needs therex after meds.  Follow Up Recommendations  Home health PT;Supervision/Assistance - 24 hour     Equipment Recommendations       Recommendations for Other Services       Precautions / Restrictions Precautions Precautions: Knee;Fall    Mobility  Bed Mobility Overal bed mobility: Modified Independent                Transfers Overall transfer level: Needs assistance Equipment used: Rolling walker (2 wheeled);Crutches Transfers: Sit to/from Stand Sit to Stand: Supervision         General transfer comment: cues for safely sitting with crutches, tends to not have Ad in hand  Ambulation/Gait Ambulation/Gait assistance: Supervision Ambulation Distance (Feet): 200 Feet Assistive device: Rolling walker (2 wheeled);Crutches Gait Pattern/deviations: Step-through pattern     General Gait Details: cues for safety with crutches   Stairs Stairs: Yes Stairs assistance: Supervision Stair Management: Step to pattern;Forwards;With crutches Number of Stairs: 2 General stair comments: cues for safety  Wheelchair Mobility    Modified Rankin (Stroke Patients Only)       Balance                                    Cognition Arousal/Alertness: Awake/alert Behavior During Therapy: Impulsive                        Exercises      General Comments        Pertinent Vitals/Pain Pain Score: 2  Pain Descriptors / Indicators: Aching Pain Intervention(s): Ice applied;Monitored during session;Premedicated before session    Home Living                      Prior Function            PT Goals (current goals can now be found in the care plan section) Progress towards PT goals: Progressing toward goals    Frequency       PT  Plan Current plan remains appropriate    Co-evaluation             End of Session   Activity Tolerance: Patient tolerated treatment well Patient left: in bed;with call bell/phone within reach;with family/visitor present     Time: 8756-4332 PT Time Calculation (min) (ACUTE ONLY): 11 min  Charges:  $Gait Training: 8-22 mins                    G Codes:      Claretha Cooper 07/22/2014, 1:43 PM

## 2014-07-22 NOTE — Care Management Note (Signed)
UR complete. [name] [name], BSN, CM [phone].

## 2014-07-22 NOTE — Progress Notes (Signed)
     Subjective: 1 Day Post-Op Procedure(s) (LRB): RIGHT TOTAL KNEE ARTHROPLASTY (Right)   Patient reports pain as mild, pain controlled. No events throughout the night. Sitting comfortably in bed eating breakfast. Ready to be discharged home.  Objective:   VITALS:   Filed Vitals:   07/22/14 0534  BP: 117/69  Pulse: 61  Temp: 97.8 F (36.6 C)  Resp: 14    Dorsiflexion/Plantar flexion intact Incision: dressing C/D/I No cellulitis present Compartment soft  LABS  Recent Labs  07/22/14 0425  HGB 14.4  HCT 42.4  WBC 20.2*  PLT 231     Recent Labs  07/22/14 0425  NA 140  K 4.6  BUN 16  CREATININE 0.91  GLUCOSE 146*     Assessment/Plan: 1 Day Post-Op Procedure(s) (LRB): RIGHT TOTAL KNEE ARTHROPLASTY (Right) Foley cath d/c'ed Advance diet Up with therapy D/C IV fluids Discharge home with home health  Follow up in 2 weeks at [facility]. Follow up with OLIN,Amiracle Neises D in 2 weeks.  Contact information:  [facility] [address] [place] [phone]     Overweight (BMI 25-29.9) Estimated body mass index is 29.02 kg/(m^2) as calculated from the following:   Height as of this encounter: 6' 1.5" (1.867 m).   Weight as of this encounter: 101.152 kg (223 lb). Patient also counseled that weight may inhibit the healing process Patient counseled that losing weight will help with future health issues      West Pugh. Sharrod Achille   PAC  07/22/2014, 8:58 AM

## 2014-07-30 NOTE — Discharge Summary (Signed)
Physician Discharge Summary  Patient ID: [name] [name] MRN: [id number] DOB/AGE: 1951-07-03 63 y.o.  Admit date: 07/21/2014 Discharge date: 07/22/2014   Procedures:  Procedure(s) (LRB): RIGHT TOTAL KNEE ARTHROPLASTY (Right)  Attending Physician:  Dr. Paralee Cancel   Admission Diagnoses:   Right knee primary OA / pain  Discharge Diagnoses:  Principal Problem:   S/P right TKA Active Problems:   Overweight (BMI 25.0-29.9)  Past Medical History  Diagnosis Date  . Arthritis   . Hepatitis 2001    C -treated and no problems since  . Pain     KNEE OA  . Trigger finger     LEFT MIDDLE FINGER  . Shortness of breath dyspnea     "SOMETIMES FEELS LIKE I CAN'T CATCH A DEEP BREATH "- ON GOING FOR YEARS - [place] WORK UP 2011 - [place]  . Stroke     HX POSSIBLE TIA - RT LEG NUMBNESS AND TINGLING FINGERS AND NUMBNESS RT FACE - ALL RESOLVED ; STATES NEUROLOGIST COULD NOT FIND ANY EVIDENCE OF STROKE - THOUGHT PT'S EPISODE MIGHT BE FROM CUTTING NICOTINE PATCH IN HALF AND APPLYING TO SKIN  . Headache 2011    HX OF VISUAL MIGRAINES - NO PAIN --MIGRAINES ATTRIBUTED TO PT CUTTING NICOTINE PATCH IN HALF BEFORE APPLYING TO HIS SKIN.      HPI: [name] [name], 63 y.o. male, has a history of pain and functional disability in the right knee due to arthritis and has failed non-surgical conservative treatments for greater than 12 weeks to include NSAID's and/or analgesics, corticosteriod injections, use of assistive devices and activity modification. Onset of symptoms was gradual, starting 6+ months ago with rapidlly worsening course since that time. The patient noted prior procedures on the knee to include arthroscopy and menisectomy on the right knee(s). Patient currently rates pain in the right knee(s) at 6 out of 10 with activity. Patient has night pain, worsening of pain with activity and weight bearing, pain that interferes with activities of daily  living, pain with passive range of motion, crepitus and joint swelling. Patient has evidence of periarticular osteophytes and joint space narrowing by imaging studies. There is no active infection. Risks, benefits and expectations were discussed with the patient. Risks including but not limited to the risk of anesthesia, blood clots, nerve damage, blood vessel damage, failure of the prosthesis, infection and up to and including death. Patient understand the risks, benefits and expectations and wishes to proceed with surgery.   PCP: [name] Huh, MD   Discharged Condition: good  Hospital Course:  Patient underwent the above stated procedure on 07/21/2014. Patient tolerated the procedure well and brought to the recovery room in good condition and subsequently to the floor.  POD #1 BP: 117/69 ; Pulse: 61 ; Temp: 97.8 F (36.6 C) ; Resp: 14 Patient reports pain as mild, pain controlled. No events throughout the night. Sitting comfortably in bed eating breakfast. Ready to be discharged home. Dorsiflexion/plantar flexion intact, incision: dressing C/D/I, no cellulitis present and compartment soft.   LABS  Basename    HGB  14.4  HCT  42.4     Discharge Exam: General appearance: alert, cooperative and no distress Extremities: Homans sign is negative, no sign of DVT, no edema, redness or tenderness in the calves or thighs and no ulcers, gangrene or trophic changes  Disposition: Home with follow up in 2 weeks   Follow-up Information    Follow up with [facility].  Why:  home health physical therapy   Contact information:   [facility] [address] [place] [address] [phone]       Follow up with Mauri Pole, MD. Schedule an appointment as soon as possible for a visit in 2 weeks.   Specialty:  Orthopedic Surgery   Contact information:   [address] [place] [address] [phone]       Discharge Instructions    Call MD / Call 911     Complete by:  As directed   If you experience chest pain or shortness of breath, CALL 911 and be transported to the hospital emergency room.  If you develope a fever above 101 F, pus (white drainage) or increased drainage or redness at the wound, or calf pain, call your surgeon's office.     Change dressing    Complete by:  As directed   Maintain surgical dressing for 10-14 days, or until follow up in the clinic.     Constipation Prevention    Complete by:  As directed   Drink plenty of fluids.  Prune juice may be helpful.  You may use a stool softener, such as Colace (over the counter) 100 mg twice a day.  Use MiraLax (over the counter) for constipation as needed.     Diet - low sodium heart healthy    Complete by:  As directed      Discharge instructions    Complete by:  As directed   Maintain surgical dressing for 10-14 days, or until follow up in the clinic. Follow up in 2 weeks at [facility]. Call with any questions or concerns.     Driving restrictions    Complete by:  As directed   No driving for 4 weeks     Increase activity slowly as tolerated    Complete by:  As directed      TED hose    Complete by:  As directed   Use stockings (TED hose) for 2 weeks on both leg(s).  You may remove them at night for sleeping.     Weight bearing as tolerated    Complete by:  As directed   Laterality:  right  Extremity:  Lower             Medication List    STOP taking these medications        meloxicam 15 MG tablet  Commonly known as:  MOBIC     traMADol 50 MG tablet  Commonly known as:  ULTRAM      TAKE these medications        aspirin 325 MG EC tablet  Take 1 tablet (325 mg total) by mouth 2 (two) times daily.     CHLOR-TRIMETON ALLERGY PO  Take 1 tablet by mouth every 4 (four) hours. AS NEEDED     DSS 100 MG Caps  Take 100 mg by mouth 2 (two) times daily.     ferrous sulfate 325 (65 FE) MG tablet  Take 1 tablet (325 mg total) by mouth 3 (three) times  daily after meals.     loratadine 10 MG tablet  Commonly known as:  CLARITIN  Take 10 mg by mouth every morning. AS NEEDED     methocarbamol 500 MG tablet  Commonly known as:  ROBAXIN  Take 1 tablet (500 mg total) by mouth every 6 (six) hours as needed for muscle spasms. IF NEEDED     nicotine 14 mg/24hr patch  Commonly known as:  NICODERM CQ - dosed in mg/24 hours  Place 14 mg onto the skin every morning.     oxyCODONE 5 MG immediate release tablet  Commonly known as:  Oxy IR/ROXICODONE  Take 1-3 tablets (5-15 mg total) by mouth every 4 (four) hours as needed for severe pain.     polyethylene glycol packet  Commonly known as:  MIRALAX / GLYCOLAX  Take 17 g by mouth 2 (two) times daily.         Signed: West Pugh. Daysean Tinkham   PA-C  07/30/2014, 1:30 PM

## 2015-01-18 ENCOUNTER — Other Ambulatory Visit: Payer: Self-pay | Admitting: Orthopedic Surgery

## 2015-01-18 DIAGNOSIS — M48061 Spinal stenosis, lumbar region without neurogenic claudication: Secondary | ICD-10-CM

## 2015-01-18 DIAGNOSIS — R200 Anesthesia of skin: Secondary | ICD-10-CM

## 2015-01-19 ENCOUNTER — Ambulatory Visit
Admission: RE | Admit: 2015-01-19 | Discharge: 2015-01-19 | Disposition: A | Discharge status: Home / Self Care | Payer: BLUE CROSS/BLUE SHIELD | Source: Ambulatory Visit | Attending: Orthopedic Surgery | Admitting: Orthopedic Surgery

## 2015-01-19 DIAGNOSIS — M48061 Spinal stenosis, lumbar region without neurogenic claudication: Secondary | ICD-10-CM

## 2015-01-19 DIAGNOSIS — R200 Anesthesia of skin: Secondary | ICD-10-CM

## 2015-01-19 MED ORDER — IOHEXOL 180 MG/ML  SOLN
1.0000 mL | Freq: Once | INTRAMUSCULAR | Status: AC | PRN
  Administered 2015-01-19: 1 mL via EPIDURAL

## 2015-01-19 MED ORDER — METHYLPREDNISOLONE ACETATE 40 MG/ML INJ SUSP (RADIOLOG
120.0000 mg | Freq: Once | INTRAMUSCULAR | Status: AC
  Administered 2015-01-19: 120 mg via EPIDURAL

## 2015-01-19 NOTE — Discharge Instructions (Signed)

## 2015-05-18 ENCOUNTER — Other Ambulatory Visit: Payer: Self-pay | Admitting: Orthopedic Surgery

## 2015-05-18 DIAGNOSIS — M4316 Spondylolisthesis, lumbar region: Secondary | ICD-10-CM

## 2015-05-20 ENCOUNTER — Other Ambulatory Visit: Payer: Self-pay | Admitting: Orthopedic Surgery

## 2015-05-20 ENCOUNTER — Ambulatory Visit
Admission: RE | Admit: 2015-05-20 | Discharge: 2015-05-20 | Disposition: A | Discharge status: Home / Self Care | Payer: BLUE CROSS/BLUE SHIELD | Source: Ambulatory Visit | Attending: Orthopedic Surgery | Admitting: Orthopedic Surgery

## 2015-05-20 DIAGNOSIS — M4316 Spondylolisthesis, lumbar region: Secondary | ICD-10-CM

## 2015-05-20 MED ORDER — IOHEXOL 180 MG/ML  SOLN
1.0000 mL | Freq: Once | INTRAMUSCULAR | Status: DC | PRN
  Administered 2015-05-20: 1 mL via EPIDURAL

## 2015-05-20 MED ORDER — METHYLPREDNISOLONE ACETATE 40 MG/ML INJ SUSP (RADIOLOG
120.0000 mg | Freq: Once | INTRAMUSCULAR | Status: AC
  Administered 2015-05-20: 120 mg via EPIDURAL

## 2015-05-20 NOTE — Discharge Instructions (Signed)

## 2015-08-04 ENCOUNTER — Other Ambulatory Visit: Payer: Self-pay | Admitting: Orthopedic Surgery

## 2015-08-04 DIAGNOSIS — M5442 Lumbago with sciatica, left side: Principal | ICD-10-CM

## 2015-08-04 DIAGNOSIS — G8929 Other chronic pain: Secondary | ICD-10-CM

## 2015-08-11 ENCOUNTER — Other Ambulatory Visit: Payer: Self-pay | Admitting: Orthopedic Surgery

## 2015-08-11 ENCOUNTER — Ambulatory Visit
Admission: RE | Admit: 2015-08-11 | Discharge: 2015-08-11 | Disposition: A | Discharge status: Home / Self Care | Payer: BLUE CROSS/BLUE SHIELD | Source: Ambulatory Visit | Attending: Orthopedic Surgery | Admitting: Orthopedic Surgery

## 2015-08-11 ENCOUNTER — Other Ambulatory Visit: Payer: BLUE CROSS/BLUE SHIELD

## 2015-08-11 DIAGNOSIS — G8929 Other chronic pain: Secondary | ICD-10-CM

## 2015-08-11 DIAGNOSIS — M5442 Lumbago with sciatica, left side: Principal | ICD-10-CM

## 2015-08-11 MED ORDER — METHYLPREDNISOLONE ACETATE 40 MG/ML INJ SUSP (RADIOLOG
120.0000 mg | Freq: Once | INTRAMUSCULAR | Status: AC
  Administered 2015-08-11: 120 mg via EPIDURAL

## 2015-08-11 MED ORDER — IOHEXOL 180 MG/ML  SOLN
1.0000 mL | Freq: Once | INTRAMUSCULAR | Status: AC | PRN
  Administered 2015-08-11: 1 mL via EPIDURAL

## 2015-08-11 NOTE — Discharge Instructions (Signed)

## 2015-11-19 ENCOUNTER — Other Ambulatory Visit: Payer: Self-pay | Admitting: Orthopedic Surgery

## 2015-11-19 DIAGNOSIS — M48061 Spinal stenosis, lumbar region without neurogenic claudication: Secondary | ICD-10-CM

## 2015-11-23 ENCOUNTER — Ambulatory Visit
Admission: RE | Admit: 2015-11-23 | Discharge: 2015-11-23 | Disposition: A | Discharge status: Home / Self Care | Payer: BLUE CROSS/BLUE SHIELD | Source: Ambulatory Visit | Attending: Orthopedic Surgery | Admitting: Orthopedic Surgery

## 2015-11-23 DIAGNOSIS — M48061 Spinal stenosis, lumbar region without neurogenic claudication: Secondary | ICD-10-CM

## 2015-11-23 MED ORDER — METHYLPREDNISOLONE ACETATE 40 MG/ML INJ SUSP (RADIOLOG
120.0000 mg | Freq: Once | INTRAMUSCULAR | Status: AC
  Administered 2015-11-23: 120 mg via EPIDURAL

## 2015-11-23 MED ORDER — IOHEXOL 180 MG/ML  SOLN
1.0000 mL | Freq: Once | INTRAMUSCULAR | Status: AC | PRN
  Administered 2015-11-23: 1 mL via EPIDURAL

## 2016-01-12 ENCOUNTER — Other Ambulatory Visit: Payer: Self-pay | Admitting: Orthopedic Surgery

## 2016-01-12 DIAGNOSIS — M79604 Pain in right leg: Secondary | ICD-10-CM

## 2016-01-18 ENCOUNTER — Ambulatory Visit
Admission: RE | Admit: 2016-01-18 | Discharge: 2016-01-18 | Disposition: A | Discharge status: Home / Self Care | Payer: BLUE CROSS/BLUE SHIELD | Source: Ambulatory Visit | Attending: Orthopedic Surgery | Admitting: Orthopedic Surgery

## 2016-01-18 DIAGNOSIS — M79604 Pain in right leg: Secondary | ICD-10-CM

## 2016-01-18 MED ORDER — METHYLPREDNISOLONE ACETATE 40 MG/ML INJ SUSP (RADIOLOG
120.0000 mg | Freq: Once | INTRAMUSCULAR | Status: AC
  Administered 2016-01-18: 120 mg via EPIDURAL

## 2016-01-18 MED ORDER — IOPAMIDOL (ISOVUE-M 200) INJECTION 41%
1.0000 mL | Freq: Once | INTRAMUSCULAR | Status: AC
  Administered 2016-01-18: 1 mL via EPIDURAL

## 2016-01-31 DIAGNOSIS — S60221A Contusion of right hand, initial encounter: Secondary | ICD-10-CM | POA: Insufficient documentation

## 2016-02-02 ENCOUNTER — Other Ambulatory Visit: Payer: Self-pay | Admitting: Orthopedic Surgery

## 2016-02-02 DIAGNOSIS — M5442 Lumbago with sciatica, left side: Secondary | ICD-10-CM

## 2016-02-08 ENCOUNTER — Ambulatory Visit
Admission: RE | Admit: 2016-02-08 | Discharge: 2016-02-08 | Disposition: A | Discharge status: Home / Self Care | Payer: BLUE CROSS/BLUE SHIELD | Source: Ambulatory Visit | Attending: Orthopedic Surgery | Admitting: Orthopedic Surgery

## 2016-02-08 ENCOUNTER — Other Ambulatory Visit: Payer: Self-pay | Admitting: Orthopedic Surgery

## 2016-02-08 DIAGNOSIS — M5442 Lumbago with sciatica, left side: Secondary | ICD-10-CM

## 2016-02-08 MED ORDER — METHYLPREDNISOLONE ACETATE 40 MG/ML INJ SUSP (RADIOLOG
120.0000 mg | Freq: Once | INTRAMUSCULAR | Status: AC
  Administered 2016-02-08: 120 mg via EPIDURAL

## 2016-02-08 MED ORDER — IOPAMIDOL (ISOVUE-M 200) INJECTION 41%
1.0000 mL | Freq: Once | INTRAMUSCULAR | Status: AC
  Administered 2016-02-08: 1 mL via EPIDURAL

## 2016-04-21 ENCOUNTER — Other Ambulatory Visit: Payer: Self-pay | Admitting: Physician Assistant

## 2016-04-21 DIAGNOSIS — M5441 Lumbago with sciatica, right side: Principal | ICD-10-CM

## 2016-04-21 DIAGNOSIS — G8929 Other chronic pain: Secondary | ICD-10-CM

## 2016-04-28 ENCOUNTER — Ambulatory Visit
Admission: RE | Admit: 2016-04-28 | Discharge: 2016-04-28 | Disposition: A | Discharge status: Home / Self Care | Payer: BLUE CROSS/BLUE SHIELD | Source: Ambulatory Visit | Attending: Physician Assistant | Admitting: Physician Assistant

## 2016-04-28 DIAGNOSIS — G8929 Other chronic pain: Secondary | ICD-10-CM

## 2016-04-28 DIAGNOSIS — M5441 Lumbago with sciatica, right side: Principal | ICD-10-CM

## 2016-04-28 MED ORDER — IOPAMIDOL (ISOVUE-M 200) INJECTION 41%
1.0000 mL | Freq: Once | INTRAMUSCULAR | Status: AC
  Administered 2016-04-28: 1 mL via EPIDURAL

## 2016-04-28 MED ORDER — METHYLPREDNISOLONE ACETATE 40 MG/ML INJ SUSP (RADIOLOG
120.0000 mg | Freq: Once | INTRAMUSCULAR | Status: AC
  Administered 2016-04-28: 120 mg via EPIDURAL

## 2016-06-21 DIAGNOSIS — F4322 Adjustment disorder with anxiety: Secondary | ICD-10-CM | POA: Diagnosis not present

## 2016-06-21 DIAGNOSIS — F331 Major depressive disorder, recurrent, moderate: Secondary | ICD-10-CM | POA: Diagnosis not present

## 2016-06-28 DIAGNOSIS — F331 Major depressive disorder, recurrent, moderate: Secondary | ICD-10-CM | POA: Diagnosis not present

## 2016-07-04 ENCOUNTER — Other Ambulatory Visit: Payer: Self-pay | Admitting: Orthopedic Surgery

## 2016-07-04 DIAGNOSIS — F331 Major depressive disorder, recurrent, moderate: Secondary | ICD-10-CM | POA: Diagnosis not present

## 2016-07-04 DIAGNOSIS — M5441 Lumbago with sciatica, right side: Principal | ICD-10-CM

## 2016-07-04 DIAGNOSIS — M79605 Pain in left leg: Secondary | ICD-10-CM | POA: Diagnosis not present

## 2016-07-04 DIAGNOSIS — G8929 Other chronic pain: Secondary | ICD-10-CM

## 2016-07-06 DIAGNOSIS — M79605 Pain in left leg: Secondary | ICD-10-CM | POA: Diagnosis not present

## 2016-07-10 DIAGNOSIS — M79605 Pain in left leg: Secondary | ICD-10-CM | POA: Diagnosis not present

## 2016-07-12 DIAGNOSIS — F4322 Adjustment disorder with anxiety: Secondary | ICD-10-CM | POA: Diagnosis not present

## 2016-07-12 DIAGNOSIS — F331 Major depressive disorder, recurrent, moderate: Secondary | ICD-10-CM | POA: Diagnosis not present

## 2016-07-13 ENCOUNTER — Ambulatory Visit
Admission: RE | Admit: 2016-07-13 | Discharge: 2016-07-13 | Disposition: A | Discharge status: Home / Self Care | Payer: BLUE CROSS/BLUE SHIELD | Source: Ambulatory Visit | Attending: Orthopedic Surgery | Admitting: Orthopedic Surgery

## 2016-07-13 DIAGNOSIS — M5416 Radiculopathy, lumbar region: Secondary | ICD-10-CM | POA: Diagnosis not present

## 2016-07-13 DIAGNOSIS — M5441 Lumbago with sciatica, right side: Principal | ICD-10-CM

## 2016-07-13 DIAGNOSIS — G8929 Other chronic pain: Secondary | ICD-10-CM

## 2016-07-13 MED ORDER — IOPAMIDOL (ISOVUE-M 200) INJECTION 41%
1.0000 mL | Freq: Once | INTRAMUSCULAR | Status: AC
  Administered 2016-07-13: 1 mL via EPIDURAL

## 2016-07-13 MED ORDER — METHYLPREDNISOLONE ACETATE 40 MG/ML INJ SUSP (RADIOLOG
120.0000 mg | Freq: Once | INTRAMUSCULAR | Status: AC
  Administered 2016-07-13: 120 mg via EPIDURAL

## 2016-07-14 ENCOUNTER — Other Ambulatory Visit: Payer: BLUE CROSS/BLUE SHIELD

## 2016-07-18 DIAGNOSIS — M79605 Pain in left leg: Secondary | ICD-10-CM | POA: Diagnosis not present

## 2016-07-27 DIAGNOSIS — F331 Major depressive disorder, recurrent, moderate: Secondary | ICD-10-CM | POA: Diagnosis not present

## 2016-07-28 ENCOUNTER — Other Ambulatory Visit: Payer: Self-pay | Admitting: Physician Assistant

## 2016-07-28 DIAGNOSIS — M4316 Spondylolisthesis, lumbar region: Secondary | ICD-10-CM

## 2016-08-02 DIAGNOSIS — E7800 Pure hypercholesterolemia, unspecified: Secondary | ICD-10-CM | POA: Diagnosis not present

## 2016-08-02 DIAGNOSIS — R7309 Other abnormal glucose: Secondary | ICD-10-CM | POA: Diagnosis not present

## 2016-08-02 DIAGNOSIS — Z0000 Encounter for general adult medical examination without abnormal findings: Secondary | ICD-10-CM | POA: Diagnosis not present

## 2016-08-02 DIAGNOSIS — M1990 Unspecified osteoarthritis, unspecified site: Secondary | ICD-10-CM | POA: Diagnosis not present

## 2016-08-02 DIAGNOSIS — Z8619 Personal history of other infectious and parasitic diseases: Secondary | ICD-10-CM | POA: Diagnosis not present

## 2016-08-09 DIAGNOSIS — F331 Major depressive disorder, recurrent, moderate: Secondary | ICD-10-CM | POA: Diagnosis not present

## 2016-08-14 ENCOUNTER — Ambulatory Visit
Admission: RE | Admit: 2016-08-14 | Discharge: 2016-08-14 | Disposition: A | Discharge status: Home / Self Care | Payer: BLUE CROSS/BLUE SHIELD | Source: Ambulatory Visit | Attending: Physician Assistant | Admitting: Physician Assistant

## 2016-08-14 DIAGNOSIS — M545 Low back pain: Secondary | ICD-10-CM | POA: Diagnosis not present

## 2016-08-14 DIAGNOSIS — M4316 Spondylolisthesis, lumbar region: Secondary | ICD-10-CM

## 2016-08-14 MED ORDER — METHYLPREDNISOLONE ACETATE 40 MG/ML INJ SUSP (RADIOLOG
120.0000 mg | Freq: Once | INTRAMUSCULAR | Status: AC
  Administered 2016-08-14: 120 mg via EPIDURAL

## 2016-08-14 MED ORDER — IOPAMIDOL (ISOVUE-M 200) INJECTION 41%
1.0000 mL | Freq: Once | INTRAMUSCULAR | Status: AC
  Administered 2016-08-14: 1 mL via EPIDURAL

## 2016-08-28 DIAGNOSIS — L720 Epidermal cyst: Secondary | ICD-10-CM | POA: Diagnosis not present

## 2016-08-28 DIAGNOSIS — R252 Cramp and spasm: Secondary | ICD-10-CM | POA: Diagnosis not present

## 2016-09-18 DIAGNOSIS — J069 Acute upper respiratory infection, unspecified: Secondary | ICD-10-CM | POA: Diagnosis not present

## 2016-10-05 ENCOUNTER — Other Ambulatory Visit: Payer: Self-pay | Admitting: Orthopedic Surgery

## 2016-10-05 DIAGNOSIS — M4316 Spondylolisthesis, lumbar region: Secondary | ICD-10-CM

## 2016-10-10 DIAGNOSIS — M5442 Lumbago with sciatica, left side: Secondary | ICD-10-CM | POA: Diagnosis not present

## 2016-10-10 DIAGNOSIS — G8929 Other chronic pain: Secondary | ICD-10-CM | POA: Diagnosis not present

## 2016-10-10 DIAGNOSIS — M4316 Spondylolisthesis, lumbar region: Secondary | ICD-10-CM | POA: Diagnosis not present

## 2016-10-11 DIAGNOSIS — H5213 Myopia, bilateral: Secondary | ICD-10-CM | POA: Diagnosis not present

## 2016-10-11 DIAGNOSIS — H2513 Age-related nuclear cataract, bilateral: Secondary | ICD-10-CM | POA: Diagnosis not present

## 2016-10-30 ENCOUNTER — Ambulatory Visit
Admission: RE | Admit: 2016-10-30 | Discharge: 2016-10-30 | Disposition: A | Discharge status: Home / Self Care | Payer: BLUE CROSS/BLUE SHIELD | Source: Ambulatory Visit | Attending: Orthopedic Surgery | Admitting: Orthopedic Surgery

## 2016-10-30 DIAGNOSIS — M4316 Spondylolisthesis, lumbar region: Secondary | ICD-10-CM

## 2016-10-30 DIAGNOSIS — M47817 Spondylosis without myelopathy or radiculopathy, lumbosacral region: Secondary | ICD-10-CM | POA: Diagnosis not present

## 2016-10-30 MED ORDER — METHYLPREDNISOLONE ACETATE 40 MG/ML INJ SUSP (RADIOLOG
120.0000 mg | Freq: Once | INTRAMUSCULAR | Status: AC
  Administered 2016-10-30: 120 mg via EPIDURAL

## 2016-10-30 MED ORDER — IOPAMIDOL (ISOVUE-M 200) INJECTION 41%
1.0000 mL | Freq: Once | INTRAMUSCULAR | Status: AC
  Administered 2016-10-30: 1 mL via EPIDURAL

## 2016-12-12 ENCOUNTER — Other Ambulatory Visit: Payer: Self-pay | Admitting: Orthopedic Surgery

## 2016-12-12 DIAGNOSIS — M4316 Spondylolisthesis, lumbar region: Secondary | ICD-10-CM

## 2016-12-20 ENCOUNTER — Other Ambulatory Visit: Payer: BLUE CROSS/BLUE SHIELD

## 2016-12-21 DIAGNOSIS — M48062 Spinal stenosis, lumbar region with neurogenic claudication: Secondary | ICD-10-CM | POA: Diagnosis not present

## 2016-12-21 DIAGNOSIS — G8929 Other chronic pain: Secondary | ICD-10-CM | POA: Diagnosis not present

## 2016-12-21 DIAGNOSIS — M4316 Spondylolisthesis, lumbar region: Secondary | ICD-10-CM | POA: Diagnosis not present

## 2016-12-21 DIAGNOSIS — M5442 Lumbago with sciatica, left side: Secondary | ICD-10-CM | POA: Diagnosis not present

## 2017-01-01 DIAGNOSIS — M48062 Spinal stenosis, lumbar region with neurogenic claudication: Secondary | ICD-10-CM | POA: Diagnosis not present

## 2017-01-05 DIAGNOSIS — M5442 Lumbago with sciatica, left side: Secondary | ICD-10-CM | POA: Diagnosis not present

## 2017-01-05 DIAGNOSIS — M4316 Spondylolisthesis, lumbar region: Secondary | ICD-10-CM | POA: Diagnosis not present

## 2017-01-05 DIAGNOSIS — G8929 Other chronic pain: Secondary | ICD-10-CM | POA: Diagnosis not present

## 2017-01-05 DIAGNOSIS — M5441 Lumbago with sciatica, right side: Secondary | ICD-10-CM | POA: Diagnosis not present

## 2017-01-10 ENCOUNTER — Other Ambulatory Visit: Payer: Self-pay | Admitting: Orthopedic Surgery

## 2017-01-10 ENCOUNTER — Ambulatory Visit
Admission: RE | Admit: 2017-01-10 | Discharge: 2017-01-10 | Disposition: A | Discharge status: Home / Self Care | Payer: BLUE CROSS/BLUE SHIELD | Source: Ambulatory Visit | Attending: Orthopedic Surgery | Admitting: Orthopedic Surgery

## 2017-01-10 DIAGNOSIS — M4316 Spondylolisthesis, lumbar region: Secondary | ICD-10-CM

## 2017-01-10 DIAGNOSIS — M47817 Spondylosis without myelopathy or radiculopathy, lumbosacral region: Secondary | ICD-10-CM | POA: Diagnosis not present

## 2017-01-10 MED ORDER — IOPAMIDOL (ISOVUE-M 200) INJECTION 41%
1.0000 mL | Freq: Once | INTRAMUSCULAR | Status: AC
  Administered 2017-01-10: 1 mL via EPIDURAL

## 2017-01-10 MED ORDER — METHYLPREDNISOLONE ACETATE 40 MG/ML INJ SUSP (RADIOLOG
120.0000 mg | Freq: Once | INTRAMUSCULAR | Status: AC
  Administered 2017-01-10: 120 mg via EPIDURAL

## 2017-02-15 ENCOUNTER — Other Ambulatory Visit: Payer: Self-pay | Admitting: Orthopedic Surgery

## 2017-02-15 DIAGNOSIS — M4316 Spondylolisthesis, lumbar region: Secondary | ICD-10-CM | POA: Diagnosis not present

## 2017-02-19 ENCOUNTER — Other Ambulatory Visit: Payer: Self-pay | Admitting: Neurological Surgery

## 2017-03-09 ENCOUNTER — Encounter (HOSPITAL_COMMUNITY)
Admission: RE | Admit: 2017-03-09 | Discharge: 2017-03-09 | Disposition: A | Discharge status: Home / Self Care | Payer: BLUE CROSS/BLUE SHIELD | Source: Ambulatory Visit | Attending: Neurological Surgery | Admitting: Neurological Surgery

## 2017-03-09 ENCOUNTER — Encounter (HOSPITAL_COMMUNITY): Payer: Self-pay

## 2017-03-09 DIAGNOSIS — M4316 Spondylolisthesis, lumbar region: Secondary | ICD-10-CM | POA: Diagnosis present

## 2017-03-09 LAB — CBC
HCT: 46.9 % (ref 39.0–52.0)
Hemoglobin: 15.3 g/dL (ref 13.0–17.0)
MCH: 31.2 pg (ref 26.0–34.0)
MCHC: 32.6 g/dL (ref 30.0–36.0)
MCV: 95.7 fL (ref 78.0–100.0)
Platelets: 199 10*3/uL (ref 150–400)
RBC: 4.9 MIL/uL (ref 4.22–5.81)
RDW: 13.2 % (ref 11.5–15.5)
WBC: 7.1 10*3/uL (ref 4.0–10.5)

## 2017-03-09 LAB — COMPREHENSIVE METABOLIC PANEL
ALT: 23 U/L (ref 17–63)
AST: 23 U/L (ref 15–41)
Albumin: 4.3 g/dL (ref 3.5–5.0)
Alkaline Phosphatase: 52 U/L (ref 38–126)
Anion gap: 7 (ref 5–15)
BUN: 19 mg/dL (ref 6–20)
CO2: 24 mmol/L (ref 22–32)
Calcium: 9.5 mg/dL (ref 8.9–10.3)
Chloride: 105 mmol/L (ref 101–111)
Creatinine, Ser: 0.75 mg/dL (ref 0.61–1.24)
GFR calc Af Amer: >60 mL/min (ref >60)
GFR calc non Af Amer: >60 mL/min (ref >60)
Glucose, Bld: 138 mg/dL — ABNORMAL HIGH (ref 65–99)
Potassium: 3.8 mmol/L (ref 3.5–5.1)
Sodium: 136 mmol/L (ref 135–145)
Total Bilirubin: 1 mg/dL (ref 0.3–1.2)
Total Protein: 6.8 g/dL (ref 6.5–8.1)

## 2017-03-09 LAB — TYPE AND SCREEN
ABO/RH(D): O NEG
Antibody Screen: NEGATIVE

## 2017-03-09 LAB — SURGICAL PCR SCREEN
MRSA, PCR: NEGATIVE
Staphylococcus aureus: POSITIVE — AB

## 2017-03-09 LAB — ABO/RH: ABO/RH(D): O NEG

## 2017-03-09 NOTE — Progress Notes (Signed)
PCP - Dr. Marisue Humble Cardiologist - patient denies  Chest x-ray - 2013 EKG - 2013 Stress Test - 2013 ECHO - 10/31/2007 Cardiac Cath - patient denies  Sleep Study - 2016 or 2017, patient unsure; patient states it was negative for sleep apnea; records requested.   Patient denies shortness of breath, fever, cough and chest pain at PAT appointment   Patient verbalized understanding of instructions that were given to them at the PAT appointment. Patient was also instructed that they will need to review over the PAT instructions again at home before surgery.

## 2017-03-09 NOTE — Progress Notes (Signed)
Patient notified of positive PCR for MSSA.  Mupirocin called in to [facility] on [address].  Patient stated he would pick up Rx tomorrow morning. Educated patient to apply ointment to both nostrils twice daily for five days. Verbalized understanding.

## 2017-03-09 NOTE — Pre-Procedure Instructions (Signed)
[name] [name]  [birth year]      [id number] - [place], [place] - [place] [place] [address] Phone: [phone] Fax: [phone]  [facility], [place] [address] Phone: [phone] Fax: [phone]    Your procedure is scheduled on Monday, March 19, 2017.  Report to [facility] Admitting at [place].M.  Call this number if you have problems the morning of surgery:  [phone]   Remember:  Do not eat food or drink liquids after midnight.  Take these medicines the morning of surgery with A SIP OF WATER: Hydrocodone-Acetaminophen (Norco) and Loratadine (Claritin)   7 days prior to surgery STOP taking any Aspirin, Aleve, Naproxen, Ibuprofen, Motrin, Advil, Goody's, BC's, all herbal medications, fish oil, and all vitamins    Do not wear jewelry, make-up or nail polish.  Do not wear lotions, powders, or perfumes, or deodorant.  Men may shave face and neck.  Do not bring valuables to the hospital.  [facility] is not responsible for any belongings or valuables.  Contacts, dentures or bridgework may not be worn into surgery.  Leave your suitcase in the car.  After surgery it may be brought to your room.  For patients admitted to the hospital, discharge time will be determined by your treatment team.  Patients discharged the day of surgery will not be allowed to drive home.   Name and phone number of your driver:    Special instructions:   [facility] [facility]- Preparing For Surgery  Before surgery, you can play an important role. Because skin is not sterile, your skin needs to be as free of germs as possible. You can reduce the number of germs on your skin by washing with CHG (chlorahexidine gluconate) Soap before surgery.  CHG is an antiseptic cleaner which kills germs and bonds with the skin to continue killing germs even  after washing.  Please do not use if you have an allergy to CHG or antibacterial soaps. If your skin becomes reddened/irritated stop using the CHG.  Do not shave (including legs and underarms) for at least 48 hours prior to first CHG shower. It is OK to shave your face.  Please follow these instructions carefully.   1. Shower the NIGHT BEFORE SURGERY and the MORNING OF SURGERY with CHG.   2. If you chose to wash your hair, wash your hair first as usual with your normal shampoo.  3. After you shampoo, rinse your hair and body thoroughly to remove the shampoo.  4. Use CHG as you would any other liquid soap. You can apply CHG directly to the skin and wash gently with a scrungie or a clean washcloth.   5. Apply the CHG Soap to your body ONLY FROM THE NECK DOWN.  Do not use on open wounds or open sores. Avoid contact with your eyes, ears, mouth and genitals (private parts). Wash genitals (private parts) with your normal soap.  6. Wash thoroughly, paying special attention to the area where your surgery will be performed.  7. Thoroughly rinse your body with warm water from the neck down.  8. DO NOT shower/wash with your normal soap after using and rinsing off the CHG Soap.  9. Pat yourself dry with a CLEAN TOWEL.   10. Wear CLEAN PAJAMAS   11. Place CLEAN SHEETS on your bed the night of your first shower and DO NOT SLEEP WITH PETS.  Day of Surgery: Do not apply any deodorants/lotions. Please wear clean clothes to the hospital/surgery center.      Please read over the following fact sheets that you were given. Pain Booklet, MRSA Information and Surgical Site Infection Prevention

## 2017-03-19 ENCOUNTER — Inpatient Hospital Stay (HOSPITAL_COMMUNITY)
Admission: RE | Admit: 2017-03-19 | Discharge: 2017-03-21 | DRG: 455 | Disposition: A | Discharge status: Home Health | Payer: BLUE CROSS/BLUE SHIELD | Source: Ambulatory Visit | Attending: Neurological Surgery | Admitting: Neurological Surgery

## 2017-03-19 ENCOUNTER — Inpatient Hospital Stay (HOSPITAL_COMMUNITY): Payer: BLUE CROSS/BLUE SHIELD | Admitting: Anesthesiology

## 2017-03-19 ENCOUNTER — Encounter (HOSPITAL_COMMUNITY): Payer: Self-pay | Admitting: Anesthesiology

## 2017-03-19 ENCOUNTER — Inpatient Hospital Stay (HOSPITAL_COMMUNITY): Payer: BLUE CROSS/BLUE SHIELD

## 2017-03-19 ENCOUNTER — Inpatient Hospital Stay (HOSPITAL_COMMUNITY)
Admission: RE | Disposition: A | Discharge status: Home Health | Payer: Self-pay | Source: Ambulatory Visit | Attending: Neurological Surgery

## 2017-03-19 ENCOUNTER — Inpatient Hospital Stay (HOSPITAL_COMMUNITY): Payer: BLUE CROSS/BLUE SHIELD | Admitting: Vascular Surgery

## 2017-03-19 DIAGNOSIS — M4726 Other spondylosis with radiculopathy, lumbar region: Principal | ICD-10-CM | POA: Diagnosis present

## 2017-03-19 DIAGNOSIS — M62830 Muscle spasm of back: Secondary | ICD-10-CM | POA: Diagnosis present

## 2017-03-19 DIAGNOSIS — M4326 Fusion of spine, lumbar region: Secondary | ICD-10-CM | POA: Diagnosis not present

## 2017-03-19 DIAGNOSIS — M48062 Spinal stenosis, lumbar region with neurogenic claudication: Secondary | ICD-10-CM | POA: Diagnosis not present

## 2017-03-19 DIAGNOSIS — Z87891 Personal history of nicotine dependence: Secondary | ICD-10-CM | POA: Diagnosis not present

## 2017-03-19 DIAGNOSIS — Z96653 Presence of artificial knee joint, bilateral: Secondary | ICD-10-CM | POA: Diagnosis present

## 2017-03-19 DIAGNOSIS — M4316 Spondylolisthesis, lumbar region: Secondary | ICD-10-CM | POA: Diagnosis not present

## 2017-03-19 DIAGNOSIS — Z419 Encounter for procedure for purposes other than remedying health state, unspecified: Secondary | ICD-10-CM

## 2017-03-19 DIAGNOSIS — M5416 Radiculopathy, lumbar region: Secondary | ICD-10-CM | POA: Diagnosis not present

## 2017-03-19 DIAGNOSIS — S60221A Contusion of right hand, initial encounter: Secondary | ICD-10-CM | POA: Diagnosis not present

## 2017-03-19 DIAGNOSIS — Z888 Allergy status to other drugs, medicaments and biological substances status: Secondary | ICD-10-CM | POA: Diagnosis not present

## 2017-03-19 SURGERY — POSTERIOR LUMBAR FUSION 2 LEVEL
Anesthesia: General | Site: Back

## 2017-03-19 MED ORDER — THROMBIN 20000 UNITS EX SOLR
CUTANEOUS | Status: DC | PRN
  Administered 2017-03-19: 09:00:00 via TOPICAL

## 2017-03-19 MED ORDER — ALBUMIN HUMAN 5 % IV SOLN
INTRAVENOUS | Status: DC | PRN
  Administered 2017-03-19: 09:00:00 via INTRAVENOUS

## 2017-03-19 MED ORDER — LIDOCAINE HCL (CARDIAC) 20 MG/ML IV SOLN
INTRAVENOUS | Status: AC
  Filled 2017-03-19: qty 5

## 2017-03-19 MED ORDER — FENTANYL CITRATE (PF) 250 MCG/5ML IJ SOLN
INTRAMUSCULAR | Status: AC
  Filled 2017-03-19: qty 10

## 2017-03-19 MED ORDER — HYDROMORPHONE HCL 1 MG/ML IJ SOLN
1.0000 mg | INTRAMUSCULAR | Status: DC | PRN
  Administered 2017-03-19: 1 mg via INTRAVENOUS
  Filled 2017-03-19: qty 1

## 2017-03-19 MED ORDER — SUGAMMADEX SODIUM 200 MG/2ML IV SOLN
INTRAVENOUS | Status: AC
  Filled 2017-03-19: qty 2

## 2017-03-19 MED ORDER — CHLORHEXIDINE GLUCONATE CLOTH 2 % EX PADS: 6.0000 | MEDICATED_PAD | Freq: Once | CUTANEOUS | Status: DC

## 2017-03-19 MED ORDER — ONDANSETRON HCL 4 MG/2ML IJ SOLN
INTRAMUSCULAR | Status: AC
  Filled 2017-03-19: qty 2

## 2017-03-19 MED ORDER — DEXTROSE 5 % IV SOLN
INTRAVENOUS | Status: DC | PRN
  Administered 2017-03-19: 08:00:00 via INTRAVENOUS

## 2017-03-19 MED ORDER — METHOCARBAMOL 500 MG PO TABS
500.0000 mg | ORAL_TABLET | Freq: Four times a day (QID) | ORAL | Status: DC | PRN
  Administered 2017-03-19: 500 mg via ORAL
  Filled 2017-03-19: qty 1

## 2017-03-19 MED ORDER — THROMBIN 5000 UNITS EX SOLR
CUTANEOUS | Status: AC
  Filled 2017-03-19: qty 5000

## 2017-03-19 MED ORDER — SODIUM CHLORIDE 0.9% FLUSH: 3.0000 mL | INTRAVENOUS | Status: DC | PRN

## 2017-03-19 MED ORDER — DOCUSATE SODIUM 100 MG PO CAPS
100.0000 mg | ORAL_CAPSULE | Freq: Two times a day (BID) | ORAL | Status: DC
  Administered 2017-03-19 – 2017-03-21 (×4): 100 mg via ORAL
  Filled 2017-03-19 (×4): qty 1

## 2017-03-19 MED ORDER — FENTANYL CITRATE (PF) 100 MCG/2ML IJ SOLN
INTRAMUSCULAR | Status: AC
  Filled 2017-03-19: qty 4

## 2017-03-19 MED ORDER — VECURONIUM BROMIDE 10 MG IV SOLR
INTRAVENOUS | Status: AC
  Filled 2017-03-19: qty 20

## 2017-03-19 MED ORDER — ACETAMINOPHEN 325 MG PO TABS
650.0000 mg | ORAL_TABLET | ORAL | Status: DC | PRN
  Administered 2017-03-19 – 2017-03-20 (×2): 650 mg via ORAL
  Filled 2017-03-19 (×2): qty 2

## 2017-03-19 MED ORDER — OXYCODONE HCL 5 MG PO TABS
5.0000 mg | ORAL_TABLET | ORAL | Status: DC | PRN
  Administered 2017-03-19 – 2017-03-20 (×5): 10 mg via ORAL
  Filled 2017-03-19 (×5): qty 2

## 2017-03-19 MED ORDER — MIDAZOLAM HCL 2 MG/2ML IJ SOLN
INTRAMUSCULAR | Status: AC
  Filled 2017-03-19: qty 2

## 2017-03-19 MED ORDER — ARTIFICIAL TEARS OPHTHALMIC OINT
TOPICAL_OINTMENT | OPHTHALMIC | Status: AC
  Filled 2017-03-19: qty 3.5

## 2017-03-19 MED ORDER — OXYCODONE HCL 5 MG/5ML PO SOLN: 5.0000 mg | Freq: Once | ORAL | Status: AC | PRN

## 2017-03-19 MED ORDER — THROMBIN 5000 UNITS EX SOLR
OROMUCOSAL | Status: DC | PRN
  Administered 2017-03-19 (×3): via TOPICAL

## 2017-03-19 MED ORDER — PHENYLEPHRINE HCL 10 MG/ML IJ SOLN
INTRAVENOUS | Status: DC | PRN
  Administered 2017-03-19: 15 ug/min via INTRAVENOUS

## 2017-03-19 MED ORDER — POLYETHYLENE GLYCOL 3350 17 G PO PACK: 17.0000 g | PACK | Freq: Every day | ORAL | Status: DC | PRN

## 2017-03-19 MED ORDER — ROCURONIUM BROMIDE 50 MG/5ML IV SOLN
INTRAVENOUS | Status: AC
  Filled 2017-03-19: qty 1

## 2017-03-19 MED ORDER — LACTATED RINGERS IV SOLN
INTRAVENOUS | Status: DC | PRN
  Administered 2017-03-19 (×4): via INTRAVENOUS

## 2017-03-19 MED ORDER — SODIUM CHLORIDE 0.9% FLUSH
3.0000 mL | Freq: Two times a day (BID) | INTRAVENOUS | Status: DC
  Administered 2017-03-20: 3 mL via INTRAVENOUS

## 2017-03-19 MED ORDER — DEXAMETHASONE SODIUM PHOSPHATE 10 MG/ML IJ SOLN
INTRAMUSCULAR | Status: DC | PRN
  Administered 2017-03-19: 10 mg via INTRAVENOUS

## 2017-03-19 MED ORDER — FENTANYL CITRATE (PF) 100 MCG/2ML IJ SOLN
50.0000 ug | Freq: Once | INTRAMUSCULAR | Status: AC
  Administered 2017-03-19: 50 ug via INTRAVENOUS

## 2017-03-19 MED ORDER — ROCURONIUM BROMIDE 100 MG/10ML IV SOLN
INTRAVENOUS | Status: DC | PRN
Start: 2017-03-19 — End: 2017-03-19
  Administered 2017-03-19: 50 mg via INTRAVENOUS

## 2017-03-19 MED ORDER — ONDANSETRON HCL 4 MG/2ML IJ SOLN
INTRAMUSCULAR | Status: AC
  Filled 2017-03-19: qty 4

## 2017-03-19 MED ORDER — VECURONIUM BROMIDE 10 MG IV SOLR
INTRAVENOUS | Status: AC
  Filled 2017-03-19: qty 10

## 2017-03-19 MED ORDER — ALUM & MAG HYDROXIDE-SIMETH 200-200-20 MG/5ML PO SUSP: 30.0000 mL | Freq: Four times a day (QID) | ORAL | Status: DC | PRN

## 2017-03-19 MED ORDER — 0.9 % SODIUM CHLORIDE (POUR BTL) OPTIME
TOPICAL | Status: DC | PRN
  Administered 2017-03-19: 1000 mL

## 2017-03-19 MED ORDER — KETOROLAC TROMETHAMINE 15 MG/ML IJ SOLN
INTRAMUSCULAR | Status: AC
  Filled 2017-03-19: qty 1

## 2017-03-19 MED ORDER — CEFAZOLIN SODIUM-DEXTROSE 2-4 GM/100ML-% IV SOLN
2.0000 g | Freq: Three times a day (TID) | INTRAVENOUS | Status: AC
  Administered 2017-03-19 – 2017-03-20 (×2): 2 g via INTRAVENOUS
  Filled 2017-03-19 (×2): qty 100

## 2017-03-19 MED ORDER — FENTANYL CITRATE (PF) 250 MCG/5ML IJ SOLN
INTRAMUSCULAR | Status: AC
  Filled 2017-03-19: qty 5

## 2017-03-19 MED ORDER — KETOROLAC TROMETHAMINE 30 MG/ML IJ SOLN
15.0000 mg | Freq: Four times a day (QID) | INTRAMUSCULAR | Status: AC
  Administered 2017-03-19 – 2017-03-20 (×6): 15 mg via INTRAVENOUS
  Filled 2017-03-19 (×5): qty 1

## 2017-03-19 MED ORDER — MENTHOL 3 MG MT LOZG: 1.0000 | LOZENGE | OROMUCOSAL | Status: DC | PRN

## 2017-03-19 MED ORDER — LORATADINE 10 MG PO TABS
10.0000 mg | ORAL_TABLET | Freq: Every day | ORAL | Status: DC
  Administered 2017-03-20 – 2017-03-21 (×2): 10 mg via ORAL
  Filled 2017-03-19 (×2): qty 1

## 2017-03-19 MED ORDER — EPHEDRINE 5 MG/ML INJ
INTRAVENOUS | Status: AC
  Filled 2017-03-19: qty 10

## 2017-03-19 MED ORDER — ONDANSETRON HCL 4 MG/2ML IJ SOLN
INTRAMUSCULAR | Status: DC | PRN
  Administered 2017-03-19: 4 mg via INTRAVENOUS

## 2017-03-19 MED ORDER — THROMBIN 20000 UNITS EX SOLR
CUTANEOUS | Status: AC
  Filled 2017-03-19: qty 20000

## 2017-03-19 MED ORDER — BUPIVACAINE HCL (PF) 0.5 % IJ SOLN
INTRAMUSCULAR | Status: AC
  Filled 2017-03-19: qty 30

## 2017-03-19 MED ORDER — ACETAMINOPHEN 650 MG RE SUPP: 650.0000 mg | RECTAL | Status: DC | PRN

## 2017-03-19 MED ORDER — METHOCARBAMOL 500 MG PO TABS
ORAL_TABLET | ORAL | Status: AC
  Filled 2017-03-19: qty 1

## 2017-03-19 MED ORDER — PROPOFOL 10 MG/ML IV BOLUS
INTRAVENOUS | Status: DC | PRN
  Administered 2017-03-19: 150 mg via INTRAVENOUS

## 2017-03-19 MED ORDER — SODIUM CHLORIDE 0.9 % IV SOLN
INTRAVENOUS | Status: DC | PRN
  Administered 2017-03-19: 11:00:00 via INTRAVENOUS

## 2017-03-19 MED ORDER — SENNA 8.6 MG PO TABS
1.0000 | ORAL_TABLET | Freq: Two times a day (BID) | ORAL | Status: DC
  Administered 2017-03-19 – 2017-03-21 (×4): 8.6 mg via ORAL
  Filled 2017-03-19 (×4): qty 1

## 2017-03-19 MED ORDER — PHENYLEPHRINE 40 MCG/ML (10ML) SYRINGE FOR IV PUSH (FOR BLOOD PRESSURE SUPPORT)
PREFILLED_SYRINGE | INTRAVENOUS | Status: AC
  Filled 2017-03-19: qty 10

## 2017-03-19 MED ORDER — METHOCARBAMOL 1000 MG/10ML IJ SOLN
500.0000 mg | Freq: Four times a day (QID) | INTRAVENOUS | Status: DC | PRN
  Administered 2017-03-20 (×2): 500 mg via INTRAVENOUS
  Filled 2017-03-19 (×4): qty 5

## 2017-03-19 MED ORDER — PHENOL 1.4 % MT LIQD: 1.0000 | OROMUCOSAL | Status: DC | PRN

## 2017-03-19 MED ORDER — BISACODYL 10 MG RE SUPP: 10.0000 mg | Freq: Every day | RECTAL | Status: DC | PRN

## 2017-03-19 MED ORDER — DEXAMETHASONE SODIUM PHOSPHATE 10 MG/ML IJ SOLN
INTRAMUSCULAR | Status: AC
  Filled 2017-03-19: qty 1

## 2017-03-19 MED ORDER — ARTIFICIAL TEARS OPHTHALMIC OINT
TOPICAL_OINTMENT | OPHTHALMIC | Status: DC | PRN
Start: 2017-03-19 — End: 2017-03-19
  Administered 2017-03-19: 1 via OPHTHALMIC

## 2017-03-19 MED ORDER — LACTATED RINGERS IV SOLN: INTRAVENOUS | Status: DC

## 2017-03-19 MED ORDER — ONDANSETRON HCL 4 MG PO TABS: 4.0000 mg | ORAL_TABLET | Freq: Four times a day (QID) | ORAL | Status: DC | PRN

## 2017-03-19 MED ORDER — NICOTINE 21 MG/24HR TD PT24
21.0000 mg | MEDICATED_PATCH | Freq: Every day | TRANSDERMAL | Status: DC
  Administered 2017-03-19 – 2017-03-21 (×3): 21 mg via TRANSDERMAL
  Filled 2017-03-19 (×3): qty 1

## 2017-03-19 MED ORDER — FLEET ENEMA 7-19 GM/118ML RE ENEM: 1.0000 | ENEMA | Freq: Once | RECTAL | Status: DC | PRN

## 2017-03-19 MED ORDER — LIDOCAINE-EPINEPHRINE 1 %-1:100000 IJ SOLN
INTRAMUSCULAR | Status: AC
  Filled 2017-03-19: qty 1

## 2017-03-19 MED ORDER — ONDANSETRON HCL 4 MG/2ML IJ SOLN: 4.0000 mg | Freq: Once | INTRAMUSCULAR | Status: DC | PRN

## 2017-03-19 MED ORDER — PROPOFOL 10 MG/ML IV BOLUS
INTRAVENOUS | Status: AC
  Filled 2017-03-19: qty 40

## 2017-03-19 MED ORDER — ONDANSETRON HCL 4 MG/2ML IJ SOLN
4.0000 mg | Freq: Four times a day (QID) | INTRAMUSCULAR | Status: DC | PRN
Start: 2017-03-19 — End: 2017-03-21

## 2017-03-19 MED ORDER — LIDOCAINE-EPINEPHRINE 1 %-1:100000 IJ SOLN
INTRAMUSCULAR | Status: DC | PRN
  Administered 2017-03-19: 5 mL

## 2017-03-19 MED ORDER — OXYCODONE HCL 5 MG PO TABS
5.0000 mg | ORAL_TABLET | Freq: Once | ORAL | Status: AC | PRN
  Administered 2017-03-19: 5 mg via ORAL

## 2017-03-19 MED ORDER — SUGAMMADEX SODIUM 200 MG/2ML IV SOLN
INTRAVENOUS | Status: DC | PRN
  Administered 2017-03-19: 200 mg via INTRAVENOUS

## 2017-03-19 MED ORDER — EPHEDRINE SULFATE 50 MG/ML IJ SOLN
INTRAMUSCULAR | Status: DC | PRN
  Administered 2017-03-19: 10 mg via INTRAVENOUS

## 2017-03-19 MED ORDER — FENTANYL CITRATE (PF) 100 MCG/2ML IJ SOLN
25.0000 ug | INTRAMUSCULAR | Status: DC | PRN
  Administered 2017-03-19 (×3): 50 ug via INTRAVENOUS

## 2017-03-19 MED ORDER — BACITRACIN 50000 UNITS IM SOLR
INTRAMUSCULAR | Status: DC | PRN
  Administered 2017-03-19: 09:00:00

## 2017-03-19 MED ORDER — VECURONIUM BROMIDE 10 MG IV SOLR
INTRAVENOUS | Status: DC | PRN
  Administered 2017-03-19 (×3): 5 mg via INTRAVENOUS
  Administered 2017-03-19: 3 mg via INTRAVENOUS
  Administered 2017-03-19: 5 mg via INTRAVENOUS
  Administered 2017-03-19: 2 mg via INTRAVENOUS

## 2017-03-19 MED ORDER — BUPIVACAINE HCL (PF) 0.5 % IJ SOLN
INTRAMUSCULAR | Status: DC | PRN
  Administered 2017-03-19: 5 mL
  Administered 2017-03-19: 20 mL

## 2017-03-19 MED ORDER — KETOROLAC TROMETHAMINE 15 MG/ML IJ SOLN
15.0000 mg | Freq: Once | INTRAMUSCULAR | Status: AC
  Administered 2017-03-19: 15 mg via INTRAVENOUS

## 2017-03-19 MED ORDER — CEFAZOLIN SODIUM-DEXTROSE 2-4 GM/100ML-% IV SOLN
2.0000 g | INTRAVENOUS | Status: AC
  Administered 2017-03-19 (×2): 2 g via INTRAVENOUS
  Filled 2017-03-19: qty 100

## 2017-03-19 MED ORDER — HYDROCODONE-ACETAMINOPHEN 5-325 MG PO TABS: 1.0000 | ORAL_TABLET | Freq: Three times a day (TID) | ORAL | Status: DC | PRN

## 2017-03-19 MED ORDER — OXYCODONE HCL 5 MG PO TABS
ORAL_TABLET | ORAL | Status: AC
  Filled 2017-03-19: qty 1

## 2017-03-19 MED ORDER — LIDOCAINE HCL (CARDIAC) 20 MG/ML IV SOLN
INTRAVENOUS | Status: DC | PRN
  Administered 2017-03-19: 50 mg via INTRAVENOUS

## 2017-03-19 MED ORDER — MIDAZOLAM HCL 5 MG/5ML IJ SOLN
INTRAMUSCULAR | Status: DC | PRN
  Administered 2017-03-19: 2 mg via INTRAVENOUS

## 2017-03-19 MED ORDER — FENTANYL CITRATE (PF) 100 MCG/2ML IJ SOLN
INTRAMUSCULAR | Status: DC | PRN
  Administered 2017-03-19 (×5): 50 ug via INTRAVENOUS
  Administered 2017-03-19: 250 ug via INTRAVENOUS
  Administered 2017-03-19 (×5): 50 ug via INTRAVENOUS

## 2017-03-19 SURGICAL SUPPLY — 70 items
ADH SKN CLS APL DERMABOND .7 (GAUZE/BANDAGES/DRESSINGS) ×1
APL SRG 60D 8 XTD TIP BNDBL (TIP)
BAG DECANTER FOR FLEXI CONT (MISCELLANEOUS) ×3 IMPLANT
BASKET BONE COLLECTION (BASKET) ×3 IMPLANT
BLADE CLIPPER SURG (BLADE) IMPLANT
BUR MATCHSTICK NEURO 3.0 LAGG (BURR) ×3 IMPLANT
CANISTER SUCT 3000ML PPV (MISCELLANEOUS) ×3 IMPLANT
CARTRIDGE OIL MAESTRO DRILL (MISCELLANEOUS) ×1 IMPLANT
CONT SPEC 4OZ CLIKSEAL STRL BL (MISCELLANEOUS) ×3 IMPLANT
COVER BACK TABLE 60X90IN (DRAPES) ×3 IMPLANT
DECANTER SPIKE VIAL GLASS SM (MISCELLANEOUS) ×3 IMPLANT
DERMABOND ADVANCED (GAUZE/BANDAGES/DRESSINGS) ×2
DERMABOND ADVANCED .7 DNX12 (GAUZE/BANDAGES/DRESSINGS) ×1 IMPLANT
DEVICE DISSECT PLASMABLAD 3.0S (MISCELLANEOUS) ×1 IMPLANT
DIFFUSER DRILL AIR PNEUMATIC (MISCELLANEOUS) ×3 IMPLANT
DRAPE C-ARM 42X72 X-RAY (DRAPES) ×6 IMPLANT
DRAPE C-ARMOR (DRAPES) ×2 IMPLANT
DRAPE HALF SHEET 40X57 (DRAPES) IMPLANT
DRAPE LAPAROTOMY 100X72X124 (DRAPES) ×3 IMPLANT
DRAPE POUCH INSTRU U-SHP 10X18 (DRAPES) ×3 IMPLANT
DRSG OPSITE POSTOP 4X8 (GAUZE/BANDAGES/DRESSINGS) ×2 IMPLANT
DURAPREP 26ML APPLICATOR (WOUND CARE) ×3 IMPLANT
DURASEAL APPLICATOR TIP (TIP) IMPLANT
DURASEAL SPINE SEALANT 3ML (MISCELLANEOUS) IMPLANT
ELECT REM PT RETURN 9FT ADLT (ELECTROSURGICAL) ×3
ELECTRODE REM PT RTRN 9FT ADLT (ELECTROSURGICAL) ×1 IMPLANT
GAUZE SPONGE 4X4 12PLY STRL (GAUZE/BANDAGES/DRESSINGS) ×3 IMPLANT
GAUZE SPONGE 4X4 16PLY XRAY LF (GAUZE/BANDAGES/DRESSINGS) ×4 IMPLANT
GLOVE BIOGEL PI IND STRL 8 (GLOVE) IMPLANT
GLOVE BIOGEL PI IND STRL 8.5 (GLOVE) ×2 IMPLANT
GLOVE BIOGEL PI INDICATOR 8 (GLOVE) ×2
GLOVE BIOGEL PI INDICATOR 8.5 (GLOVE) ×4
GLOVE ECLIPSE 7.5 STRL STRAW (GLOVE) ×6 IMPLANT
GLOVE ECLIPSE 8.5 STRL (GLOVE) ×8 IMPLANT
GOWN STRL REUS W/ TWL LRG LVL3 (GOWN DISPOSABLE) IMPLANT
GOWN STRL REUS W/ TWL XL LVL3 (GOWN DISPOSABLE) IMPLANT
GOWN STRL REUS W/TWL 2XL LVL3 (GOWN DISPOSABLE) ×6 IMPLANT
GOWN STRL REUS W/TWL LRG LVL3 (GOWN DISPOSABLE)
GOWN STRL REUS W/TWL XL LVL3 (GOWN DISPOSABLE)
HEMOSTAT POWDER KIT SURGIFOAM (HEMOSTASIS) ×6 IMPLANT
KIT BASIN OR (CUSTOM PROCEDURE TRAY) ×3 IMPLANT
KIT ROOM TURNOVER OR (KITS) ×3 IMPLANT
NDL SPNL 18GX3.5 QUINCKE PK (NEEDLE) IMPLANT
NEEDLE HYPO 22GX1.5 SAFETY (NEEDLE) ×3 IMPLANT
NEEDLE SPNL 18GX3.5 QUINCKE PK (NEEDLE) IMPLANT
NS IRRIG 1000ML POUR BTL (IV SOLUTION) ×3 IMPLANT
OIL CARTRIDGE MAESTRO DRILL (MISCELLANEOUS) ×3
PACK LAMINECTOMY NEURO (CUSTOM PROCEDURE TRAY) ×3 IMPLANT
PAD ARMBOARD 7.5X6 YLW CONV (MISCELLANEOUS) ×9 IMPLANT
PATTIES SURGICAL .5 X1 (DISPOSABLE) ×3 IMPLANT
PLASMABLADE 3.0S (MISCELLANEOUS) ×3
PUTTY GAMMA BSM 10CC (Putty) ×2 IMPLANT
ROD VITALITY CRVD 5.5X75MM (Rod) ×4 IMPLANT
SCREW VITALITY PA 6.5X45MM (Screw) ×12 IMPLANT
SPACER INTERBODY 13X12X11 (Spacer) ×4 IMPLANT
SPACER ZYSTON STRT 12X25X10X8 (Spacer) ×4 IMPLANT
SPONGE LAP 4X18 X RAY DECT (DISPOSABLE) ×2 IMPLANT
SPONGE SURGIFOAM ABS GEL 100 (HEMOSTASIS) ×3 IMPLANT
SUT PROLENE 6 0 BV (SUTURE) IMPLANT
SUT VIC AB 1 CT1 18XBRD ANBCTR (SUTURE) ×1 IMPLANT
SUT VIC AB 1 CT1 8-18 (SUTURE) ×6
SUT VIC AB 2-0 CP2 18 (SUTURE) ×5 IMPLANT
SUT VIC AB 3-0 SH 8-18 (SUTURE) ×5 IMPLANT
SYR 3ML LL SCALE MARK (SYRINGE) ×16 IMPLANT
SYR 5ML LL (SYRINGE) IMPLANT
TOP CLOSURE 5.5-6.0 SHEAR TOP (Neuro Prosthesis/Implant) ×12 IMPLANT
TOWEL GREEN STERILE (TOWEL DISPOSABLE) ×3 IMPLANT
TOWEL GREEN STERILE FF (TOWEL DISPOSABLE) ×3 IMPLANT
TRAY FOLEY W/METER SILVER 16FR (SET/KITS/TRAYS/PACK) ×3 IMPLANT
WATER STERILE IRR 1000ML POUR (IV SOLUTION) ×3 IMPLANT

## 2017-03-19 NOTE — Anesthesia Preprocedure Evaluation (Signed)
Anesthesia Evaluation  Patient identified by MRN, date of birth, ID band Patient awake    Reviewed: Allergy & Precautions, NPO status , Patient's Chart, lab work & pertinent test results  Airway Mallampati: III  TM Distance: >3 FB Neck ROM: Full    Dental  (+) Teeth Intact, Dental Advisory Given   Pulmonary former smoker,    breath sounds clear to auscultation       Cardiovascular  Rhythm:Regular Rate:Normal     Neuro/Psych    GI/Hepatic   Endo/Other    Renal/GU      Musculoskeletal   Abdominal   Peds  Hematology   Anesthesia Other Findings   Reproductive/Obstetrics                             Anesthesia Physical Anesthesia Plan  ASA: II  Anesthesia Plan: General   Post-op Pain Management:    Induction: Intravenous  PONV Risk Score and Plan: Ondansetron and Dexamethasone  Airway Management Planned: Oral ETT  Additional Equipment:   Intra-op Plan:   Post-operative Plan: Extubation in OR  Informed Consent: I have reviewed the patients History and Physical, chart, labs and discussed the procedure including the risks, benefits and alternatives for the proposed anesthesia with the patient or authorized representative who has indicated his/her understanding and acceptance.   Dental advisory given  Plan Discussed with: CRNA and Anesthesiologist  Anesthesia Plan Comments:         Anesthesia Quick Evaluation

## 2017-03-19 NOTE — Transfer of Care (Signed)
Immediate Anesthesia Transfer of Care Note  Patient: [name] [name]  Procedure(s) Performed: Procedure(s) with comments: Lumbar three-four, Lumbar four-five Posterior lumbar interbody fusion (N/A) - L3-4 L4-5 Posterior lumbar interbody fusion  Patient Location: PACU  Anesthesia Type:General  Level of Consciousness: awake, oriented, sedated, patient cooperative and responds to stimulation  Airway & Oxygen Therapy: Patient Spontanous Breathing and Patient connected to nasal cannula oxygen  Post-op Assessment: Report given to RN, Post -op Vital signs reviewed and stable, Patient moving all extremities and Patient moving all extremities X 4  Post vital signs: Reviewed and stable  Last Vitals:  Vitals:   03/19/17 0613  BP: 130/81  Pulse: 82  Resp: 20  Temp: 36.8 C    Last Pain:  Vitals:   03/19/17 0633  TempSrc:   PainSc: 2       Patients Stated Pain Goal: 1 (17/71/16 5790)  Complications: No apparent anesthesia complications

## 2017-03-19 NOTE — Anesthesia Postprocedure Evaluation (Signed)
Anesthesia Post Note  Patient: [name] [name]  Procedure(s) Performed: Procedure(s) (LRB): Lumbar three-four, Lumbar four-five Posterior lumbar interbody fusion (N/A)     Patient location during evaluation: PACU Anesthesia Type: General Level of consciousness: awake and awake and alert Pain management: pain level controlled Vital Signs Assessment: post-procedure vital signs reviewed and stable Respiratory status: spontaneous breathing, nonlabored ventilation and respiratory function stable Cardiovascular status: blood pressure returned to baseline Anesthetic complications: no    Last Vitals:  Vitals:   03/19/17 1530 03/19/17 1545  BP: (!) 93/56 97/69  Pulse: 78 74  Resp: (!) 4 10  Temp:  36.8 C    Last Pain:  Vitals:   03/19/17 1545  TempSrc:   PainSc: 5                  Kellie Chisolm COKER

## 2017-03-19 NOTE — Progress Notes (Signed)
Patient ID: [name] [name], male   DOB: [birth year], 66 y.o.   MRN: [id number] Signs are stable. Motor function appears to be doing quite well. Patient has a fair amount of muscle spasm in his lumbar spine. His incisions clean and dry.

## 2017-03-19 NOTE — H&P (Signed)
CHIEF COMPLAINT: Back pain and bilateral lower extremity pain for several years.  HISTORY OF PRESENT ILLNESS: [name] [name] is a 66 year old right-handed individual, who tells me that he has had problems for several years time now with his back and legs causing increasing pain and dysfunction.  He describes symptoms typical of neurogenic claudication, where he could walk a certain distance, but then he finds that the pain and weakness would overtake him such that he could not go on.  He initially was treated with an epidural steroid injection a couple of years ago, and he notes that this got him good relief.  He was able to tolerate a trip to [place] at the time, and since that time he has required some intermittent epidural steroid injections to keep immobilized.  The last series of injections, however, seem to have not given as good relief as he had had previously.  Because of this, he had a subsequent MRI in April of this year.  He is seen now with these studies for further evaluation of this process.     Today in the office to further his workup, I obtained a lateral flexion-extension film of the lumbar spine in addition to a singular AP view.  These radiographs demonstrate that he has a 7+ mm spondylolisthesis at L4-L5 that is quite fixed.  He has approximately a 3 mm anterolisthesis at L3-L4 with some mobility in reduction and extension.  L2-3 appears to show fairly normal anatomy.  The alignment in the coronal plane on the AP view is quite adequate.  It does have some modest degenerative changes at L5-S1 also.  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  He also has a moderately severe stenosis at the level of L3-L4 with some marked facet hypertrophy at that level.  L2-3 appears normal.  PAST MEDICAL HISTORY: Reveals that his general health has been good.  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 Alvan Dame.  In 2000 he had a left shoulder repair by Dr. Veverly Fells.  He notes that his joints have otherwise done quite well.  He enjoys figure skating and notes that he has not been able to do this very well here recently because of the degree of stenosis and the incipient weakness that he has had and the ability to control his legs very well.  He was advised regarding surgery recently by Dr. Rolena Infante, who suggested a 1 level decompression arthrodesis at the level of L4-L5.  He uses some aspirin for back pain intermittently.  He had previously had some hydrocodone, but is not using this currently.  REVIEW OF SYSTEMS: Notable for high cholesterol, leg pain while walking, arthritis in his hands, back pain, and leg pain on a 14-point review sheet.  PHYSICAL EXAM: I note that he stands straight and erect.  He walks without antalgia, and he has good strength in the major groups, including the iliopsoas, quads, tibialis anterior and gastrocs as noted by his ability to toe and heel walk.  His deep tendon reflexes are 2+ in both patellae and 1+ in the Achilles.  IMPRESSION: The patient has evidence of significant spondylosis and spondylolisthesis at L3-4 and L4-5.  Clearly, L4-5 is the worst level, but at L3-L4 he already has moderate degree of stenosis in addition to a 3-level anterolisthesis with advanced collapse of the disc.  I  have advised that if he is considering the surgery, he should have both of these levels decompressed and stabilized, and I discussed with him demonstrating on a model the nature of the surgery to remove bone on the backside of the spine, use that for bone graft, decompress the paths of the nerves and the central canal, place spacers where the discs were after removing the disc entirely at L3-4 and L4-5, and then placing pedicle screws between L3 and L5 to hold the vertebrae in position to allow them to heal as one.  This is essentially doing a fusion that is  allowing the bone to heal from L3 to L4 to L5 as a singular unit eliminating the 2 joints that are there.  I noted that he is not a candidate for any artificial type of joint because of the advanced nature of the degenerative process, and I believe that he would do well with a two-level arthrodesis.  After some discussion of the nature of the surgery, nature of recovery, expected length of stay of about 3-4 days in the hospital, use of a brace for the postoperative period of about 6 weeks, the patient wishes to proceed with surgery, and is admittded for this procedurel.

## 2017-03-19 NOTE — Anesthesia Procedure Notes (Signed)
Procedure Name: Intubation Date/Time: 03/19/2017 7:52 AM Performed by: Jacquiline Doe A Pre-anesthesia Checklist: Patient identified, Emergency Drugs available, Suction available and Patient being monitored Patient Re-evaluated:Patient Re-evaluated prior to inductionOxygen Delivery Method: Circle System Utilized and Circle system utilized Preoxygenation: Pre-oxygenation with 100% oxygen Intubation Type: IV induction and Cricoid Pressure applied Ventilation: Mask ventilation without difficulty and Oral airway inserted - appropriate to patient size Laryngoscope Size: Mac and 4 Grade View: Grade I Tube type: Oral Tube size: 7.5 mm Number of attempts: 1 Airway Equipment and Method: Stylet and Oral airway Placement Confirmation: ETT inserted through vocal cords under direct vision,  positive ETCO2 and breath sounds checked- equal and bilateral Secured at: 23 cm Tube secured with: Tape Dental Injury: Teeth and Oropharynx as per pre-operative assessment

## 2017-03-20 LAB — CBC
HCT: 35 % — ABNORMAL LOW (ref 39.0–52.0)
Hemoglobin: 11.6 g/dL — ABNORMAL LOW (ref 13.0–17.0)
MCH: 31.9 pg (ref 26.0–34.0)
MCHC: 33.1 g/dL (ref 30.0–36.0)
MCV: 96.2 fL (ref 78.0–100.0)
Platelets: 158 10*3/uL (ref 150–400)
RBC: 3.64 MIL/uL — ABNORMAL LOW (ref 4.22–5.81)
RDW: 13.1 % (ref 11.5–15.5)
WBC: 13.3 10*3/uL — ABNORMAL HIGH (ref 4.0–10.5)

## 2017-03-20 LAB — BASIC METABOLIC PANEL
Anion gap: 7 (ref 5–15)
BUN: 13 mg/dL (ref 6–20)
CO2: 27 mmol/L (ref 22–32)
Calcium: 8.2 mg/dL — ABNORMAL LOW (ref 8.9–10.3)
Chloride: 103 mmol/L (ref 101–111)
Creatinine, Ser: 0.9 mg/dL (ref 0.61–1.24)
GFR calc Af Amer: >60 mL/min (ref >60)
GFR calc non Af Amer: >60 mL/min (ref >60)
Glucose, Bld: 160 mg/dL — ABNORMAL HIGH (ref 65–99)
Potassium: 3.7 mmol/L (ref 3.5–5.1)
Sodium: 137 mmol/L (ref 135–145)

## 2017-03-20 MED ORDER — HYDROCODONE-ACETAMINOPHEN 10-325 MG PO TABS
1.0000 | ORAL_TABLET | ORAL | Status: DC | PRN
  Administered 2017-03-20 – 2017-03-21 (×8): 2 via ORAL
  Filled 2017-03-20 (×8): qty 2

## 2017-03-20 MED ORDER — METHOCARBAMOL 750 MG PO TABS
750.0000 mg | ORAL_TABLET | Freq: Four times a day (QID) | ORAL | Status: DC
  Administered 2017-03-20 – 2017-03-21 (×4): 750 mg via ORAL
  Filled 2017-03-20 (×4): qty 1

## 2017-03-20 MED ORDER — HYDROCODONE-ACETAMINOPHEN 10-325 MG PO TABS: 1.0000 | ORAL_TABLET | ORAL | Status: DC | PRN

## 2017-03-20 MED ORDER — DIAZEPAM 5 MG PO TABS: 5.0000 mg | ORAL_TABLET | Freq: Four times a day (QID) | ORAL | Status: DC | PRN

## 2017-03-20 MED ORDER — DIAZEPAM 5 MG PO TABS
5.0000 mg | ORAL_TABLET | Freq: Four times a day (QID) | ORAL | Status: DC | PRN
  Administered 2017-03-20 – 2017-03-21 (×3): 5 mg via ORAL
  Filled 2017-03-20 (×3): qty 1

## 2017-03-20 MED ORDER — DEXAMETHASONE 4 MG PO TABS
4.0000 mg | ORAL_TABLET | Freq: Once | ORAL | Status: AC
  Administered 2017-03-20: 4 mg via ORAL
  Filled 2017-03-20: qty 1

## 2017-03-20 MED FILL — Gelatin Absorbable MT Powder: OROMUCOSAL | Qty: 1 | Status: AC

## 2017-03-20 MED FILL — Thrombin For Soln 5000 Unit: CUTANEOUS | Qty: 5000 | Status: AC

## 2017-03-20 MED FILL — Sodium Chloride IV Soln 0.9%: INTRAVENOUS | Qty: 3000 | Status: AC

## 2017-03-20 MED FILL — Heparin Sodium (Porcine) Inj 1000 Unit/ML: INTRAMUSCULAR | Qty: 30 | Status: AC

## 2017-03-20 NOTE — Evaluation (Signed)
Occupational Therapy Evaluation Patient Details Name: [name] [name] MRN: [id number] DOB: 1951/06/02 Today's Date: 03/20/2017    History of Present Illness 66 yo male s/p PLIF L3-4 L4-5 PMH: L shoulder surg, BIL LE knee replacement    Clinical Impression   Patient evaluated by Occupational Therapy with no further acute OT needs identified. All education has been completed and the patient has no further questions. See below for any follow-up Occupational Therapy or equipment needs. OT to sign off. Thank you for referral.      Follow Up Recommendations  No OT follow up    Equipment Recommendations  None recommended by OT    Recommendations for Other Services       Precautions / Restrictions Precautions Precautions: Back Precaution Comments: back handout provided and reviewed in detail Restrictions Weight Bearing Restrictions: (P) No      Mobility Bed Mobility Overal bed mobility: Independent                Transfers Overall transfer level: Independent                    Balance                                           ADL either performed or assessed with clinical judgement   ADL Overall ADL's : Needs assistance/impaired Eating/Feeding: Independent   Grooming: Wash/dry hands;Wash/dry face;Oral care;Supervision/safety   Upper Body Bathing: Supervision/ safety   Lower Body Bathing: Maximal assistance Lower Body Bathing Details (indicate cue type and reason): plans to have wife (A)       Lower Body Dressing Details (indicate cue type and reason): requires wife (A)  Toilet Transfer: Supervision/safety       Tub/ Shower Transfer: Supervision/safety   Functional mobility during ADLs: Supervision/safety General ADL Comments: plans to have wife (A) with all adls.   Back handout provided and reviewed adls in detail. Pt educated on: clothing between brace, never sleep in brace, set an alarm at night for medication, avoid sitting  for long periods of time, correct bed positioning for sleeping, correct sequence for bed mobility, avoiding lifting more than 5 pounds and never wash directly over incision. All education is complete and patient indicates understanding.  Pt educated on bathing and avoid washing directly on incision. Pt educated to use new wash cloth and towel each day. Pt educated to allow water to run across dressing and not to soak in a tub at this time.      Vision Baseline Vision/History: Wears glasses Wears Glasses: At all times       Perception     Praxis      Pertinent Vitals/Pain Pain Assessment: Faces Faces Pain Scale: Hurts whole lot Pain Location: back at surgerical site Pain Descriptors / Indicators: Operative site guarding;Spasm;Shooting Pain Intervention(s): Monitored during session;Premedicated before session;Repositioned;Limited activity within patient's tolerance     Hand Dominance Right   Extremity/Trunk Assessment Upper Extremity Assessment Upper Extremity Assessment: Overall WFL for tasks assessed   Lower Extremity Assessment Lower Extremity Assessment: Defer to PT evaluation   Cervical / Trunk Assessment Cervical / Trunk Assessment: Other exceptions (s/p surg)   Communication Communication Communication: No difficulties   Cognition Arousal/Alertness: Awake/alert Behavior During Therapy: WFL for tasks assessed/performed Overall Cognitive Status: Within Functional Limits for tasks assessed  General Comments: reports fatigue from lack of sleep   General Comments       Exercises     Shoulder Instructions      Home Living Family/patient expects to be discharged to:: Private residence Living Arrangements: Spouse/significant other Available Help at Discharge: Family Type of Home: House Home Access: Stairs to enter [employer] of Steps: 2/3 Entrance Stairs-Rails: Can reach both Home Layout: One level      Bathroom Shower/Tub: Occupational psychologist: Handicapped height     Home Equipment: Environmental consultant - 2 wheels;Bedside commode          Prior Functioning/Environment Level of Independence: Independent                 OT Problem List:        OT Treatment/Interventions:      OT Goals(Current goals can be found in the care plan section) Acute Rehab OT Goals Patient Stated Goal: to return home to sleep Potential to Achieve Goals: Good  OT Frequency:     Barriers to D/C:            Co-evaluation              AM-PAC PT "6 Clicks" Daily Activity     Outcome Measure Help from another person eating meals?: None Help from another person taking care of personal grooming?: None Help from another person toileting, which includes using toliet, bedpan, or urinal?: None Help from another person bathing (including washing, rinsing, drying)?: None Help from another person to put on and taking off regular upper body clothing?: None Help from another person to put on and taking off regular lower body clothing?: A Lot 6 Click Score: 22   End of Session Equipment Utilized During Treatment: Back brace Nurse Communication: Mobility status;Precautions  Activity Tolerance: Patient tolerated treatment well Patient left: in bed;with call bell/phone within reach  OT Visit Diagnosis: Unsteadiness on feet (R26.81)                Time: 7048-8891 OT Time Calculation (min): 19 min Charges:  OT General Charges $OT Visit: 1 Procedure OT Evaluation $OT Eval Moderate Complexity: 1 Procedure G-Codes:      Jeri Modena   OTR/L Pager: [phone] Office: [phone] .   Parke Poisson B 03/20/2017, 8:07 AM

## 2017-03-20 NOTE — Op Note (Signed)
Date of surgery: 03/19/2017 Preoperative diagnosis: Spondylolisthesis L3-4 L4-5 with stenosis, neurogenic claudication, lumbar radiculopathy Postoperative diagnosis: Same Procedure: Laminectomy L3-4 and L4-5 with decompression of the L3-L4 and L5 nerve roots, posterior lumbar interbody arthrodesis with peek spacers local autograft and allograft L3-4 L4-5. Segmental pedicle screw fixation L3-L5 with posterior lateral arthrodesis using local autograft and allograft. Surgeon: Kristeen Miss First assistant: Francesca Jewett M.D. Anesthesia: Gen. endotracheal  Indications:  [name] [name] is a [id number] individual who's had significant back and bilateral lower extremity pain.  he was evaluated in the office with an MRI that demonstrates presence of advanced spondylolisthesis at L4-5 mild spondylosis with stenosis at L3-4. Noting that he had significant leg weakness already he was advised regarding the need for surgical decompression and stabilization from L3-L5.  Procedure: The patient was brought to the operating room supine on a stretcher. After the smooth induction of general endotracheal anesthesia, she was turned prone. The back was prepped with alcohol DuraPrep and draped in a sterile fashion. Midline incision was created and carried down to the lumbar dorsal fascia. First spinous processes were noted to be that of L3 and L4. The dissection was then carried out over the transverse processes of L3-4 and L4-5 and the facets at L3-4 and L4-5 where he didn't find isolated the transverse spaces were decorticated and packed off for later use in grafting. Then laminectomy was created removing the inferior marginal lamina of L3 out to and including the entirety of the facet at L3-L4. A total laminectomy of L4 was created removing a markedly hypertrophied laminar arch and facet complex on both sides. This left her good relief of the stenosis that was noted to be underneath. During the dissection was a singular small dural  rent which was oversewn with a singular 6-0 Prolene suture. The dissection was then continued isolating the L3 nerve roots and decompressing them using accommodation of curettes rongeurs and 2 mm Kerrison punches. The decompression was taken out into the foraminal region. The disc spaces were then isolated L3-4 and L4-5. With this the common dural tube could be mobilized and total discectomy was performed first at L4-5 removing substantially degenerated and desiccated disc from the interspace. The total discectomy was performed bilaterally and the endplates were decorticated. Once the disc space was prepared and in her body spacer was chosen is felt that a 11 mm tall 8 lordotic spacer measuring 25 mm in length with allow for good reduction of the spondylolisthesis and also opening of the foraminal zones. With this bone graft was placed into the interspace along with the 2 cages and 9 mL of autograft allograft mixture. Attention was then turned to L3 for worse similar discectomy was created here a 10 mm tall 8 lordotic spacer fit best into the interspace allowing for good reduction. Again bone graft were placed into the interspace with a total of 6 mL of bone graft. The intertransverse spaces were then packed with  the remainder of the autograft allograft mixture. Pedicle entry sites were then chosen at L3-L4 and L5 and under fluoroscopic guidance 6.5 x 45 mm screws were placed through the pedicles. 65 mm precontoured rods were then used to connect the screws together in a neutral construct. Final radiographs were obtained in AP and lateral projection. Hemostasis in the soft tissues was obtained meticulously the dural closure was noted to be intact with no further leakage even under Valsalva and with this then the lumbar dorsal fascia was reapproximated with #1 Vicryl interrupted fashion 2-0  Vicryl was used in subcutaneous anus tissues and 3-0 Vicryl subcuticular. Blood loss was estimated 3 and 50 mL and no Cell  Saver blood was returned to the patient.

## 2017-03-20 NOTE — Evaluation (Signed)
Physical Therapy Evaluation Patient Details Name: [name] [name] MRN: [id number] DOB: 09-04-1951 Today's Date: 03/20/2017   History of Present Illness  66 yo male s/p PLIF L3-4 L4-5 PMH: L shoulder surg, BIL LE knee replacement   Clinical Impression  Pt admitted with above diagnosis. Pt currently with functional limitations due to the deficits listed below (see PT Problem List). At the time of PT eval pt was able to perform transfers and ambulation with gross supervision for safety. Light hands on assist provided for stair training. Pt was educated on walking program, activity progression, precautions, car transfer, and general safety for home mobility. Pt will benefit from skilled PT to increase their independence and safety with mobility to allow discharge to the venue listed below.       Follow Up Recommendations No PT follow up;Supervision - Intermittent    Equipment Recommendations  None recommended by PT    Recommendations for Other Services       Precautions / Restrictions Precautions Precautions: Back Precaution Booklet Issued: Yes (comment) Precaution Comments: Reviewed verbally during functional mobility Required Braces or Orthoses: Spinal Brace Spinal Brace: Lumbar corset;Applied in sitting position Restrictions Weight Bearing Restrictions: No      Mobility  Bed Mobility Overal bed mobility: Independent                Transfers Overall transfer level: Needs assistance Equipment used: None Transfers: Sit to/from Stand Sit to Stand: Supervision         General transfer comment: Supervision for safety due to pain. VC's for improved posture to maintain precautions.   Ambulation/Gait Ambulation/Gait assistance: Supervision Ambulation Distance (Feet): 400 Feet Assistive device: None Gait Pattern/deviations: Step-through pattern;Decreased stride length;Trunk flexed Gait velocity: Decreased Gait velocity interpretation: Below normal speed for  age/gender General Gait Details: VC's for improved posture, core stability, and general safety. Pt with somewhat slow and guarded gait but no instability noted.   Stairs Stairs: Yes Stairs assistance: Min guard Stair Management: One rail Right;Alternating pattern;Forwards Number of Stairs: 4 General stair comments: VC's for general safety awareness. Pt was able to perform alternating step pattern without difficulty however appears guarded.   Wheelchair Mobility    Modified Rankin (Stroke Patients Only)       Balance Overall balance assessment: No apparent balance deficits (not formally assessed)                                           Pertinent Vitals/Pain Pain Assessment: 0-10 Pain Score: 6  Faces Pain Scale: Hurts whole lot Pain Location: back at surgerical site Pain Descriptors / Indicators: Operative site guarding;Spasm;Shooting Pain Intervention(s): Limited activity within patient's tolerance;Monitored during session;Repositioned    Home Living Family/patient expects to be discharged to:: Private residence Living Arrangements: Spouse/significant other Available Help at Discharge: Family Type of Home: House Home Access: Stairs to enter Entrance Stairs-Rails: Right;Left;Can reach both Entrance Stairs-Number of Steps: 3 then 2 Home Layout: One [place]: Environmental consultant - 2 wheels;Bedside commode      Prior Function Level of Independence: Independent         Comments: Pt is a Programme researcher, 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 in the past month     Hand Dominance   Dominant Hand: Right    Extremity/Trunk Assessment   Upper Extremity Assessment Upper Extremity Assessment: Defer to OT evaluation    Lower Extremity Assessment Lower  Extremity Assessment: Overall WFL for tasks assessed (some weakness noted consistent with pre-op diagnosis)    Cervical / Trunk Assessment Cervical / Trunk Assessment: Other exceptions Cervical / Trunk  Exceptions: s/p surgery  Communication   Communication: No difficulties  Cognition Arousal/Alertness: Awake/alert Behavior During Therapy: WFL for tasks assessed/performed Overall Cognitive Status: Within Functional Limits for tasks assessed                                 General Comments: reports fatigue from lack of sleep      General Comments      Exercises     Assessment/Plan    PT Assessment Patient needs continued PT services  PT Problem List Decreased strength;Decreased range of motion;Decreased activity tolerance;Decreased mobility;Decreased knowledge of use of DME;Decreased safety awareness;Decreased knowledge of precautions;Pain       PT Treatment Interventions DME instruction;Stair training;Gait training;Functional mobility training;Therapeutic activities;Therapeutic exercise;Neuromuscular re-education;Patient/family education    PT Goals (Current goals can be found in the Care Plan section)  Acute Rehab PT Goals Patient Stated Goal: Home tomorrow PT Goal Formulation: With patient Time For Goal Achievement: 03/27/17 Potential to Achieve Goals: Good    Frequency Min 5X/week   Barriers to discharge        Co-evaluation               AM-PAC PT "6 Clicks" Daily Activity  Outcome Measure Difficulty turning over in bed (including adjusting bedclothes, sheets and blankets)?: None Difficulty moving from lying on back to sitting on the side of the bed? : None Difficulty sitting down on and standing up from a chair with arms (e.g., wheelchair, bedside commode, etc,.)?: None Help needed moving to and from a bed to chair (including a wheelchair)?: A Little Help needed walking in hospital room?: A Little Help needed climbing 3-5 steps with a railing? : A Little 6 Click Score: 21    End of Session Equipment Utilized During Treatment: Back brace Activity Tolerance: Patient tolerated treatment well Patient left: in bed;with call bell/phone within  reach;with family/visitor present Nurse Communication: Mobility status PT Visit Diagnosis: Other symptoms and signs involving the nervous system (F00.712)    Time: 1975-8832 PT Time Calculation (min) (ACUTE ONLY): 14 min   Charges:   PT Evaluation $PT Eval Moderate Complexity: 1 Procedure     PT G Codes:        Rolinda Roan, PT, DPT Acute Rehabilitation Services Pager: [phone]   Thelma Comp 03/20/2017, 9:43 AM

## 2017-03-20 NOTE — Progress Notes (Signed)
Patient ID: [name] [name], male   DOB: [birth year], 66 y.o.   MRN: [id number] Vital signs stable Motor function is intact Patient notes fair amount of back pain not responsive well to Dilaudid or oxycodone however he has had good relief with the use of hydrocodone. Will change to hydrocodone 10 for pain relief observe. Postoperative hemoglobin is 11.6. Labs otherwise okay

## 2017-03-21 MED ORDER — OXYCODONE HCL 5 MG PO TABS: ORAL_TABLET | ORAL | 0 refills | Status: DC

## 2017-03-21 MED ORDER — DEXAMETHASONE 1 MG PO TABS: ORAL_TABLET | ORAL | 0 refills | Status: DC

## 2017-03-21 MED ORDER — DIAZEPAM 5 MG PO TABS: 5.0000 mg | ORAL_TABLET | Freq: Four times a day (QID) | ORAL | 0 refills | Status: DC | PRN

## 2017-03-21 MED ORDER — DEXAMETHASONE 4 MG PO TABS: 4.0000 mg | ORAL_TABLET | Freq: Once | ORAL | Status: DC

## 2017-03-21 NOTE — Discharge Summary (Signed)
Physician Discharge Summary  Patient ID: [name] [name] MRN: [id number] DOB/AGE: 64-Nov-1952 64 y.o.  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 He tolerated surgery well.  Consults: None  Significant Diagnostic Studies: None  Treatments: surgery: Decompression L3-4 and L4-5 via laminectomy posterior lumbar interbody arthrodesis with peek spacers local autograft allograft pedicle screw fixation L3-L5.  Discharge Exam: Blood pressure (!) 157/88, pulse 81, temperature 98.2 F (36.8 C), resp. rate 18, height 6\' 2"  (1.88 m), weight 95.3 kg (210 lb), SpO2 97 %. Incision is clean and dry. Station and gait are intact.  Disposition: 06-Home-Health Care Svc  Discharge Instructions    Call MD for:  redness, tenderness, or signs of infection (pain, swelling, redness, odor or green/yellow discharge around incision site)    Complete by:  As directed    Call MD for:  severe uncontrolled pain    Complete by:  As directed    Call MD for:  temperature >100.4    Complete by:  As directed    Diet - low sodium heart healthy    Complete by:  As directed    Discharge instructions    Complete by:  As directed    Okay to shower. Do not apply salves or appointments to incision. No heavy lifting with the upper extremities greater than 15 pounds. May resume driving when not requiring pain medication and patient feels comfortable with doing so.   Incentive spirometry RT    Complete by:  As directed    Increase activity slowly    Complete by:  As directed      Allergies as of 03/21/2017      Reactions    Statins Other (See Comments)   Muscle pain      Medication List    TAKE these medications   aspirin 325 MG tablet Take 975 mg by mouth every 4 (four) hours as needed for mild pain.   dexamethasone 1 MG tablet Commonly known as:  DECADRON 2 tablets twice daily for 2 days, one tablet twice daily for 2 days, one tablet daily for 2 days.   diazepam 5 MG tablet Commonly known as:  VALIUM Take 1 tablet (5 mg total) by mouth every 6 (six) hours as needed for muscle spasms (IF ROBAXIN IS INEFFECTIVE).   HYDROcodone-acetaminophen 5-325 MG tablet Commonly known as:  NORCO/VICODIN Take 1.5 tablets by mouth 3 (three) times daily as needed for pain.   loratadine 10 MG tablet Commonly known as:  CLARITIN Take 10 mg by mouth daily.   nicotine 21 mg/24hr patch Commonly known as:  NICODERM CQ - dosed in mg/24 hours Place 21 mg onto the skin daily.   oxyCODONE 5 MG immediate release tablet Commonly known as:  ROXICODONE One or two tablets as needed every four hrs for pain        Signed: Earleen Newport 03/21/2017, 5:09 PM

## 2017-03-21 NOTE — Progress Notes (Signed)
Pt doing well. Pt and wife given D/C instructions with Rx's, verbal understanding was provided. Pt's dressing was removed prior to D/C per MD order. Pt's IV was removed. Pt D/C'd home via walking @ 1815 per MD order. Pt is stable @ D/C and has no other needs at this time. Holli Humbles, RN

## 2017-03-21 NOTE — Progress Notes (Signed)
Physical Therapy Treatment and Discharge Patient Details Name: [name] [name] MRN: [id number] DOB: [birth year] Today's Date: 03/21/2017    History of Present Illness 66 yo male s/p PLIF L3-4 L4-5 PMH: L shoulder surg, BIL LE knee replacement     PT Comments    Pt progressing well with mobility. He is currently functioning at a modified independent level and has met acute PT goals. Pt was educated on precautions, car transfer, brace application and wearing schedule, and walking program. We discussed walking program and reinforced appropriate activity progression at home. Will sign off at this time. If needs change, please reconsult.    Follow Up Recommendations  No PT follow up;Supervision - Intermittent     Equipment Recommendations  None recommended by PT    Recommendations for Other Services       Precautions / Restrictions Precautions Precautions: Back Precaution Booklet Issued: Yes (comment) Precaution Comments: Reviewed verbally during functional mobility Required Braces or Orthoses: Spinal Brace Spinal Brace: Lumbar corset;Applied in sitting position Restrictions Weight Bearing Restrictions: No    Mobility  Bed Mobility Overal bed mobility: Independent                Transfers Overall transfer level: Modified independent Equipment used: None Transfers: Sit to/from Stand           General transfer comment: Pt demonstrated good technique and hand placement on seated surface for safety.   Ambulation/Gait Ambulation/Gait assistance: Modified independent (Device/Increase time) Ambulation Distance (Feet): 600 Feet Assistive device: None Gait Pattern/deviations: Step-through pattern;Decreased stride length;Trunk flexed   Gait velocity interpretation: at or above normal speed for age/gender General Gait Details: Pt with improved gait speed and overall appearance of stability this session. No assist required. No unsteadiness noted.    Stairs Stairs: Yes   Stair Management: One rail Right;Alternating pattern;Forwards Number of Stairs: 10 General stair comments: VC's for general safety awareness. Pt was able to perform alternating step pattern without difficulty however appears guarded.   Wheelchair Mobility    Modified Rankin (Stroke Patients Only)       Balance Overall balance assessment: No apparent balance deficits (not formally assessed)                                          Cognition Arousal/Alertness: Awake/alert Behavior During Therapy: WFL for tasks assessed/performed Overall Cognitive Status: Within Functional Limits for tasks assessed                                        Exercises      General Comments        Pertinent Vitals/Pain Pain Assessment: 0-10 Pain Score: 6  Pain Location: back at surgerical site Pain Descriptors / Indicators: Operative site guarding;Spasm;Shooting Pain Intervention(s): Monitored during session    Home Living Family/patient expects to be discharged to:: Private residence Living Arrangements: Spouse/significant other Available Help at Discharge: Family Type of Home: House Home Access: Stairs to enter Entrance Stairs-Rails: Right;Left;Can reach both [place]: One [place]: Environmental consultant - 2 wheels;Bedside commode      Prior Function Level of Independence: Independent      Comments: Pt is a Programme researcher, 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 in the past month   PT Goals (current goals can now be found in the  care plan section) Acute Rehab PT Goals Patient Stated Goal: Home today PT Goal Formulation: With patient Time For Goal Achievement: 03/27/17 Potential to Achieve Goals: Good Progress towards PT goals: Goals met/education completed, patient discharged from PT    Frequency    Min 5X/week      PT Plan Current plan remains appropriate    Co-evaluation              AM-PAC PT "6 Clicks" Daily Activity  Outcome Measure   Difficulty turning over in bed (including adjusting bedclothes, sheets and blankets)?: None Difficulty moving from lying on back to sitting on the side of the bed? : None Difficulty sitting down on and standing up from a chair with arms (e.g., wheelchair, bedside commode, etc,.)?: None Help needed moving to and from a bed to chair (including a wheelchair)?: A Little Help needed walking in hospital room?: A Little Help needed climbing 3-5 steps with a railing? : A Little 6 Click Score: 21    End of Session Equipment Utilized During Treatment: Back brace Activity Tolerance: Patient tolerated treatment well Patient left: in bed;with call bell/phone within reach;with family/visitor present Nurse Communication: Mobility status PT Visit Diagnosis: Other symptoms and signs involving the nervous system (H00.979)     Time: 0812-0827 PT Time Calculation (min) (ACUTE ONLY): 15 min  Charges:  $Gait Training: 8-22 mins                    G Codes:       Rolinda Roan, PT, DPT Acute Rehabilitation Services Pager: [phone] 03/21/2017, 10:27 AM

## 2017-03-21 NOTE — Discharge Instructions (Signed)
Wound Care Leave incision open to air. You may shower. Do not scrub directly on incision.  Do not put any creams, lotions, or ointments on incision. Activity Walk each and every day, increasing distance each day. No lifting greater than 5 lbs.  Avoid excessive neck motion. No driving for 2 weeks; may ride as a passenger locally. Wear neck brace at all times except when showering.  If provided soft collar, may wear for comfort unless otherwise instructed. Diet Resume your normal diet.  Return to Work Will be discussed at you follow up appointment. Call Your Doctor If Any of These Occur Redness, drainage, or swelling at the wound.  Temperature greater than 101 degrees. Severe pain not relieved by pain medication. Increased difficulty swallowing. Incision starts to come apart. Follow Up Appt Call today for appointment in 4 weeks ([phone]) or for problems.  If you have any hardware placed in your spine, you will need an x-ray before your appointment.

## 2017-04-11 DIAGNOSIS — M4316 Spondylolisthesis, lumbar region: Secondary | ICD-10-CM | POA: Diagnosis not present

## 2017-04-12 ENCOUNTER — Other Ambulatory Visit: Payer: Self-pay | Admitting: Neurological Surgery

## 2017-04-12 DIAGNOSIS — M4316 Spondylolisthesis, lumbar region: Secondary | ICD-10-CM

## 2017-04-17 ENCOUNTER — Ambulatory Visit
Admission: RE | Admit: 2017-04-17 | Discharge: 2017-04-17 | Disposition: A | Discharge status: Home / Self Care | Payer: BLUE CROSS/BLUE SHIELD | Source: Ambulatory Visit | Attending: Neurological Surgery | Admitting: Neurological Surgery

## 2017-04-17 DIAGNOSIS — M5126 Other intervertebral disc displacement, lumbar region: Secondary | ICD-10-CM | POA: Diagnosis not present

## 2017-04-17 DIAGNOSIS — M4316 Spondylolisthesis, lumbar region: Secondary | ICD-10-CM

## 2017-05-10 DIAGNOSIS — M4316 Spondylolisthesis, lumbar region: Secondary | ICD-10-CM | POA: Diagnosis not present

## 2017-06-13 ENCOUNTER — Other Ambulatory Visit (HOSPITAL_COMMUNITY): Payer: Self-pay | Admitting: Neurological Surgery

## 2017-06-13 ENCOUNTER — Other Ambulatory Visit: Payer: Self-pay | Admitting: Neurological Surgery

## 2017-06-13 DIAGNOSIS — M5416 Radiculopathy, lumbar region: Secondary | ICD-10-CM

## 2017-07-12 ENCOUNTER — Ambulatory Visit (HOSPITAL_COMMUNITY)
Admission: RE | Admit: 2017-07-12 | Discharge: 2017-07-12 | Disposition: A | Discharge status: Home / Self Care | Payer: BLUE CROSS/BLUE SHIELD | Source: Ambulatory Visit | Attending: Neurological Surgery | Admitting: Neurological Surgery

## 2017-07-12 DIAGNOSIS — M4306 Spondylolysis, lumbar region: Secondary | ICD-10-CM | POA: Diagnosis not present

## 2017-07-12 DIAGNOSIS — I700 Atherosclerosis of aorta: Secondary | ICD-10-CM | POA: Insufficient documentation

## 2017-07-12 DIAGNOSIS — Z9889 Other specified postprocedural states: Secondary | ICD-10-CM | POA: Insufficient documentation

## 2017-07-12 DIAGNOSIS — M5416 Radiculopathy, lumbar region: Secondary | ICD-10-CM | POA: Diagnosis not present

## 2017-07-12 DIAGNOSIS — M48061 Spinal stenosis, lumbar region without neurogenic claudication: Secondary | ICD-10-CM

## 2017-07-12 DIAGNOSIS — M47816 Spondylosis without myelopathy or radiculopathy, lumbar region: Secondary | ICD-10-CM

## 2017-07-12 DIAGNOSIS — M4807 Spinal stenosis, lumbosacral region: Secondary | ICD-10-CM | POA: Insufficient documentation

## 2017-07-12 DIAGNOSIS — Z981 Arthrodesis status: Secondary | ICD-10-CM | POA: Insufficient documentation

## 2017-07-12 DIAGNOSIS — M5116 Intervertebral disc disorders with radiculopathy, lumbar region: Secondary | ICD-10-CM | POA: Diagnosis not present

## 2017-07-12 MED ORDER — DEXAMETHASONE 4 MG PO TABS
ORAL_TABLET | ORAL | Status: AC
  Administered 2017-07-12: 4 mg via ORAL
  Filled 2017-07-12: qty 1

## 2017-07-12 MED ORDER — HYDROCODONE-ACETAMINOPHEN 5-325 MG PO TABS
1.0000 | ORAL_TABLET | ORAL | Status: DC | PRN
  Administered 2017-07-12: 1 via ORAL
  Filled 2017-07-12: qty 2

## 2017-07-12 MED ORDER — IOPAMIDOL (ISOVUE-M 200) INJECTION 41%
INTRAMUSCULAR | Status: AC
  Filled 2017-07-12: qty 10

## 2017-07-12 MED ORDER — IOPAMIDOL (ISOVUE-M 200) INJECTION 41%
20.0000 mL | Freq: Once | INTRAMUSCULAR | Status: AC
  Administered 2017-07-12: 12 mL via INTRATHECAL

## 2017-07-12 MED ORDER — DIAZEPAM 5 MG PO TABS: 10.0000 mg | ORAL_TABLET | Freq: Once | ORAL | Status: DC

## 2017-07-12 MED ORDER — DEXAMETHASONE 4 MG PO TABS
4.0000 mg | ORAL_TABLET | Freq: Once | ORAL | Status: AC
  Administered 2017-07-12: 4 mg via ORAL
  Filled 2017-07-12: qty 1

## 2017-07-12 MED ORDER — DIAZEPAM 5 MG PO TABS
ORAL_TABLET | ORAL | Status: AC
  Filled 2017-07-12: qty 1

## 2017-07-12 MED ORDER — LIDOCAINE HCL (PF) 1 % IJ SOLN
INTRAMUSCULAR | Status: AC
  Filled 2017-07-12: qty 5

## 2017-07-12 MED ORDER — HYDROCODONE-ACETAMINOPHEN 5-325 MG PO TABS
ORAL_TABLET | ORAL | Status: AC
  Administered 2017-07-12: 1 via ORAL
  Filled 2017-07-12: qty 1

## 2017-07-12 MED ORDER — ONDANSETRON HCL 4 MG/2ML IJ SOLN: 4.0000 mg | Freq: Four times a day (QID) | INTRAMUSCULAR | Status: DC | PRN

## 2017-07-12 MED ORDER — LIDOCAINE HCL (PF) 1 % IJ SOLN
5.0000 mL | Freq: Once | INTRAMUSCULAR | Status: AC
  Administered 2017-07-12: 5 mL via INTRADERMAL

## 2017-07-12 NOTE — Discharge Instructions (Signed)
Myelogram, Care After °Refer to this sheet in the next few weeks. These instructions provide you with information about caring for yourself after your procedure. Your health care provider may also give you more specific instructions. Your treatment has been planned according to current medical practices, but problems sometimes occur. Call your health care provider if you have any problems or questions after your procedure. °What can I expect after the procedure? °After the procedure, it is common to have: °· Soreness at your injection site. °· A mild headache. ° °Follow these instructions at home: °· Drink enough fluid to keep your urine clear or pale yellow. This will help flush out the dye (contrast material) from your spine. °· Rest as told by your health care provider. Lie flat with your head slightly raised (elevated) to reduce the risk of headache. °· Do not bend, lift, or do any strenuous activity for 24-48 hours or as told by your health care provider. °· Take over-the-counter and prescription medicines only as told by your health care provider. °· Take care of and remove your bandage (dressing) as told by your health care provider. °· Bathe or shower as told by your health care provider. °Contact a health care provider if: °· You have a fever. °· You have a headache that lasts longer than 24 hours. °· You feel nauseous or vomit. °· You have a stiff neck or numbness in your legs. °· You are unable to urinate or have a bowel movement. °· You develop a rash, itching, or sneezing. °Get help right away if: °· You have new symptoms or your symptoms get worse. °· You have a seizure. °· You have trouble breathing. °This information is not intended to replace advice given to you by your health care provider. Make sure you discuss any questions you have with your health care provider. °Document Released: 09/24/2015 Document Revised: 02/03/2016 Document Reviewed: 06/10/2015 °Elsevier Interactive Patient Education ©  2018 [employer] ° °

## 2017-07-12 NOTE — Procedures (Signed)
[name] [name] is a 66 year old individual who's had significant back pain having had a surgical decompression of his lumbar spine performed in July of this past year. He's had severe spondylitic stenosis at L3-4 and L4-5. Postoperatively is had considerable problems with back pain that has persisted hardware appears to be in good position but a myelogram is now being performed to evaluate the nature of his ongoing pain with radicular features into both lower extremities.  Pre op Dx: Lumbar spondylosis status post fusion with back pain Post op Dx: Same Procedure: Lumbar myelogram Surgeon: Plato Alspaugh Puncture level: L34 Fluid color: Clear colorless Injection: Isovue-200 12 mL Findings: Solid appearing arthrodesis adjacent level spondylosis that is mild. Further evaluation with CT scanning

## 2017-07-18 DIAGNOSIS — S32009K Unspecified fracture of unspecified lumbar vertebra, subsequent encounter for fracture with nonunion: Secondary | ICD-10-CM | POA: Diagnosis not present

## 2017-07-18 DIAGNOSIS — Z6826 Body mass index (BMI) 26.0-26.9, adult: Secondary | ICD-10-CM | POA: Diagnosis not present

## 2017-07-23 ENCOUNTER — Other Ambulatory Visit: Payer: Self-pay | Admitting: Neurological Surgery

## 2017-07-26 DIAGNOSIS — S32009K Unspecified fracture of unspecified lumbar vertebra, subsequent encounter for fracture with nonunion: Secondary | ICD-10-CM | POA: Diagnosis not present

## 2017-08-06 DIAGNOSIS — S32009K Unspecified fracture of unspecified lumbar vertebra, subsequent encounter for fracture with nonunion: Secondary | ICD-10-CM | POA: Diagnosis not present

## 2017-08-28 DIAGNOSIS — Z0000 Encounter for general adult medical examination without abnormal findings: Secondary | ICD-10-CM | POA: Diagnosis not present

## 2017-08-28 DIAGNOSIS — M1990 Unspecified osteoarthritis, unspecified site: Secondary | ICD-10-CM | POA: Diagnosis not present

## 2017-08-28 DIAGNOSIS — E7800 Pure hypercholesterolemia, unspecified: Secondary | ICD-10-CM | POA: Diagnosis not present

## 2017-08-28 DIAGNOSIS — Z8619 Personal history of other infectious and parasitic diseases: Secondary | ICD-10-CM | POA: Diagnosis not present

## 2017-08-31 NOTE — Pre-Procedure Instructions (Addendum)
[name] [name]  [birth year]      [id number] - [place], [place] - [place]. [name] [address] Phone: [phone] Fax: [phone]  [facility], [name] [address] Phone: [phone] Fax: [phone]    Your procedure is scheduled on September 07, 2017.  Report to [facility] Admitting at 1000 AM.  Call this number if you have problems the morning of surgery:  [phone]   Remember:  Do not eat food or drink liquids after midnight.  Take these medicines the morning of surgery with A SIP OF WATER baclofen (lioresal), oxycodone-if needed for pain  7 days prior to surgery STOP taking any Aspirin (unless otherwise instructed by your surgeon), Aleve, Naproxen, Ibuprofen, Motrin, Advil, Goody's, BC's, all herbal medications, fish oil, and all vitamins  Continue all other medications as instructed by your physician except follow the above medication instructions before surgery   Do not wear jewelry.  Do not wear lotions, powders, or colognes, or deodorant.  Men may shave face and neck.  Do not bring valuables to the hospital.  [name] is not responsible for any belongings or valuables.  Contacts, dentures or bridgework may not be worn into surgery.  Leave your suitcase in the car.  After surgery it may be brought to your room.  For patients admitted to the hospital, discharge time will be determined by your treatment team.  Patients discharged the day of surgery will not be allowed to drive home.   Special instructions:   [place]- Preparing For Surgery  Before surgery, you can play an important role. Because skin is not sterile, your skin needs to be as free of germs as possible. You can reduce the number of germs on your skin by washing with CHG (chlorahexidine gluconate) Soap before surgery.  CHG is an  antiseptic cleaner which kills germs and bonds with the skin to continue killing germs even after washing.  Please do not use if you have an allergy to CHG or antibacterial soaps. If your skin becomes reddened/irritated stop using the CHG.  Do not shave (including legs and underarms) for at least 48 hours prior to first CHG shower. It is OK to shave your face.  Please follow these instructions carefully.   1. Shower the NIGHT BEFORE SURGERY and the MORNING OF SURGERY with CHG.   2. If you chose to wash your hair, wash your hair first as usual with your normal shampoo.  3. After you shampoo, rinse your hair and body thoroughly to remove the shampoo.  4. Use CHG as you would any other liquid soap. You can apply CHG directly to the skin and wash gently with a scrungie or a clean washcloth.   5. Apply the CHG Soap to your body ONLY FROM THE NECK DOWN.  Do not use on open wounds or open sores. Avoid contact with your eyes, ears, mouth and genitals (private parts). Wash Face and genitals (private parts)  with your normal soap.  6. Wash thoroughly, paying special attention to the area where your surgery will be performed.  7. Thoroughly rinse your body with warm water from the neck down.  8. DO NOT shower/wash with your normal soap after using and rinsing off the CHG Soap.  9. Pat yourself dry with a CLEAN TOWEL.  10. Wear CLEAN PAJAMAS to bed the night before surgery, wear comfortable clothes the morning  of surgery  11. Place CLEAN SHEETS on your bed the night of your first shower and DO NOT SLEEP WITH PETS.   Day of Surgery: Do not apply any deodorants/lotions. Please wear clean clothes to the hospital/surgery center.     Please read over the following fact sheets that you were given. Pain Booklet, Coughing and Deep Breathing, MRSA Information and Surgical Site Infection Prevention

## 2017-09-03 ENCOUNTER — Encounter (HOSPITAL_COMMUNITY)
Admission: RE | Admit: 2017-09-03 | Discharge: 2017-09-03 | Disposition: A | Discharge status: Home / Self Care | Payer: BLUE CROSS/BLUE SHIELD | Source: Ambulatory Visit | Attending: Neurological Surgery | Admitting: Neurological Surgery

## 2017-09-03 ENCOUNTER — Other Ambulatory Visit: Payer: Self-pay

## 2017-09-03 ENCOUNTER — Encounter (HOSPITAL_COMMUNITY): Payer: Self-pay

## 2017-09-03 DIAGNOSIS — R51 Headache: Secondary | ICD-10-CM | POA: Insufficient documentation

## 2017-09-03 DIAGNOSIS — Z87891 Personal history of nicotine dependence: Secondary | ICD-10-CM | POA: Diagnosis not present

## 2017-09-03 DIAGNOSIS — Z01812 Encounter for preprocedural laboratory examination: Secondary | ICD-10-CM | POA: Insufficient documentation

## 2017-09-03 DIAGNOSIS — K759 Inflammatory liver disease, unspecified: Secondary | ICD-10-CM | POA: Insufficient documentation

## 2017-09-03 DIAGNOSIS — R0602 Shortness of breath: Secondary | ICD-10-CM | POA: Diagnosis not present

## 2017-09-03 HISTORY — DX: Unspecified cataract: H26.9

## 2017-09-03 LAB — CBC
HCT: 45.8 % (ref 39.0–52.0)
Hemoglobin: 15.4 g/dL (ref 13.0–17.0)
MCH: 30.9 pg (ref 26.0–34.0)
MCHC: 33.6 g/dL (ref 30.0–36.0)
MCV: 92 fL (ref 78.0–100.0)
Platelets: 221 10*3/uL (ref 150–400)
RBC: 4.98 MIL/uL (ref 4.22–5.81)
RDW: 13.3 % (ref 11.5–15.5)
WBC: 9.8 10*3/uL (ref 4.0–10.5)

## 2017-09-03 LAB — BASIC METABOLIC PANEL
Anion gap: 11 (ref 5–15)
BUN: 9 mg/dL (ref 6–20)
CO2: 23 mmol/L (ref 22–32)
Calcium: 9.6 mg/dL (ref 8.9–10.3)
Chloride: 103 mmol/L (ref 101–111)
Creatinine, Ser: 0.87 mg/dL (ref 0.61–1.24)
GFR calc Af Amer: >60 mL/min (ref >60)
GFR calc non Af Amer: >60 mL/min (ref >60)
Glucose, Bld: 119 mg/dL — ABNORMAL HIGH (ref 65–99)
Potassium: 4.3 mmol/L (ref 3.5–5.1)
Sodium: 137 mmol/L (ref 135–145)

## 2017-09-03 LAB — SURGICAL PCR SCREEN
MRSA, PCR: NEGATIVE
Staphylococcus aureus: POSITIVE — AB

## 2017-09-03 LAB — TYPE AND SCREEN
ABO/RH(D): O NEG
Antibody Screen: NEGATIVE

## 2017-09-03 NOTE — Pre-Procedure Instructions (Signed)
Abdulahi Hildebrandt  [birth year]      [id number] - [place], [place] [place] [address] Phone: [phone] Fax: [phone]  [facility], [place] [address] Phone: [phone] Fax: [phone]    Your procedure is scheduled on Friday, September 07, 2017.   Report to [facility] Admitting at 10:00 AM.             (posted surgery time 12:02 pm - 5:06pm)   Call this number if you have problems the morning of surgery:  [phone]   Remember:  Do not eat food or drink liquids after midnight, Thursday   Take these medicines the morning of surgery with A SIP OF WATER baclofen (lioresal), oxycodone-if needed for pain  7 days prior to surgery STOP taking any Aspirin (unless otherwise instructed by your surgeon), Aleve, Naproxen, Ibuprofen, Motrin, Advil, Goody's, BC's, all herbal medications, fish oil, and all vitamins  Continue all other medications as instructed by your physician except follow the above medication instructions before surgery   Do not wear jewelry.  Do not wear lotions, colognes, or deodorant.  Men may shave face and neck.   Do not bring valuables to the hospital.  [facility] is not responsible for any belongings or valuables.  Contacts, dentures or bridgework may not be worn into surgery.  Leave your suitcase in the car.  After surgery it may be brought to your room. For patients admitted to the hospital, discharge time will be determined by your treatment team.      Special instructions:   [place]- Preparing For Surgery  Before surgery, you can play an important role. Because skin is not sterile, your skin needs to be as free of germs as possible. You can reduce the number of germs on your skin by washing with CHG (chlorahexidine gluconate) Soap before surgery.  CHG is an  antiseptic cleaner which kills germs and bonds with the skin to continue killing germs even after washing.  Please do not use if you have an allergy to CHG or antibacterial soaps. If your skin becomes reddened/irritated stop using the CHG.  Do not shave (including legs and underarms) for at least 48 hours prior to first CHG shower. It is OK to shave your face.  Please follow these instructions carefully.   1. Shower the NIGHT BEFORE SURGERY and the MORNING OF SURGERY with CHG.   2. If you chose to wash your hair, wash your hair first as usual with your normal shampoo.  3. After you shampoo, rinse your hair and body thoroughly to remove the shampoo.  4. Use CHG as you would any other liquid soap. You can apply CHG directly to the skin and wash gently with a scrungie or a clean washcloth.   5. Apply the CHG Soap to your body ONLY FROM THE NECK DOWN.  Do not use on open wounds or open sores. Avoid contact with your eyes, ears, mouth and genitals (private parts). Wash Face and genitals (private parts)  with your normal soap.  6. Wash thoroughly, paying special attention to the area where your surgery will be performed.  7. Thoroughly rinse your body with warm water from the neck down.  8. DO NOT shower/wash with your normal soap after using and rinsing off the CHG Soap.  9. Pat yourself dry with a CLEAN TOWEL.  10. Wear CLEAN PAJAMAS  to bed the night before surgery, wear comfortable clothes the morning of surgery  11. Place CLEAN SHEETS on your bed the night of your first shower and DO NOT SLEEP WITH PETS.   Day of Surgery: Do not apply any deodorants/lotions. Please wear clean clothes to the hospital/surgery center.     Please read over the following fact sheets that you were given. Pain Booklet, Coughing and Deep Breathing, MRSA Information and Surgical Site Infection Prevention

## 2017-09-03 NOTE — Progress Notes (Addendum)
PCP is Dr. Marisue Humble  LOV 08/2017 Currently denies any murmur, cp, sob.  He did say that approx. 10-15 yrs ago, did have stress test - came out normal. No cardio

## 2017-09-06 MED ORDER — CEFAZOLIN SODIUM-DEXTROSE 2-4 GM/100ML-% IV SOLN
2.0000 g | INTRAVENOUS | Status: AC
  Administered 2017-09-07: 2 g via INTRAVENOUS
  Filled 2017-09-06: qty 100

## 2017-09-07 ENCOUNTER — Inpatient Hospital Stay (HOSPITAL_COMMUNITY)
Admission: RE | Admit: 2017-09-07 | Discharge: 2017-09-08 | DRG: 517 | Disposition: A | Discharge status: Home / Self Care | Payer: BLUE CROSS/BLUE SHIELD | Source: Ambulatory Visit | Attending: Neurological Surgery | Admitting: Neurological Surgery

## 2017-09-07 ENCOUNTER — Inpatient Hospital Stay (HOSPITAL_COMMUNITY): Payer: BLUE CROSS/BLUE SHIELD | Admitting: Certified Registered Nurse Anesthetist

## 2017-09-07 ENCOUNTER — Encounter (HOSPITAL_COMMUNITY)
Admission: RE | Disposition: A | Discharge status: Home / Self Care | Payer: Self-pay | Source: Ambulatory Visit | Attending: Neurological Surgery

## 2017-09-07 ENCOUNTER — Inpatient Hospital Stay (HOSPITAL_COMMUNITY): Payer: BLUE CROSS/BLUE SHIELD

## 2017-09-07 ENCOUNTER — Encounter (HOSPITAL_COMMUNITY): Payer: Self-pay

## 2017-09-07 DIAGNOSIS — Z87891 Personal history of nicotine dependence: Secondary | ICD-10-CM | POA: Diagnosis not present

## 2017-09-07 DIAGNOSIS — Z96653 Presence of artificial knee joint, bilateral: Secondary | ICD-10-CM | POA: Diagnosis not present

## 2017-09-07 DIAGNOSIS — S32009K Unspecified fracture of unspecified lumbar vertebra, subsequent encounter for fracture with nonunion: Secondary | ICD-10-CM | POA: Diagnosis not present

## 2017-09-07 DIAGNOSIS — Z79891 Long term (current) use of opiate analgesic: Secondary | ICD-10-CM

## 2017-09-07 DIAGNOSIS — Z981 Arthrodesis status: Secondary | ICD-10-CM | POA: Diagnosis not present

## 2017-09-07 DIAGNOSIS — S32039K Unspecified fracture of third lumbar vertebra, subsequent encounter for fracture with nonunion: Secondary | ICD-10-CM | POA: Diagnosis not present

## 2017-09-07 DIAGNOSIS — Z888 Allergy status to other drugs, medicaments and biological substances status: Secondary | ICD-10-CM

## 2017-09-07 DIAGNOSIS — Z7982 Long term (current) use of aspirin: Secondary | ICD-10-CM | POA: Diagnosis not present

## 2017-09-07 DIAGNOSIS — Z79899 Other long term (current) drug therapy: Secondary | ICD-10-CM

## 2017-09-07 DIAGNOSIS — Z791 Long term (current) use of non-steroidal anti-inflammatories (NSAID): Secondary | ICD-10-CM | POA: Diagnosis not present

## 2017-09-07 DIAGNOSIS — M4326 Fusion of spine, lumbar region: Secondary | ICD-10-CM | POA: Diagnosis not present

## 2017-09-07 DIAGNOSIS — Z419 Encounter for procedure for purposes other than remedying health state, unspecified: Secondary | ICD-10-CM

## 2017-09-07 DIAGNOSIS — M960 Pseudarthrosis after fusion or arthrodesis: Secondary | ICD-10-CM | POA: Diagnosis not present

## 2017-09-07 DIAGNOSIS — Z96662 Presence of left artificial ankle joint: Secondary | ICD-10-CM | POA: Diagnosis not present

## 2017-09-07 DIAGNOSIS — S32049K Unspecified fracture of fourth lumbar vertebra, subsequent encounter for fracture with nonunion: Secondary | ICD-10-CM | POA: Diagnosis not present

## 2017-09-07 DIAGNOSIS — M8580 Other specified disorders of bone density and structure, unspecified site: Secondary | ICD-10-CM | POA: Diagnosis present

## 2017-09-07 DIAGNOSIS — S32059K Unspecified fracture of fifth lumbar vertebra, subsequent encounter for fracture with nonunion: Secondary | ICD-10-CM | POA: Diagnosis not present

## 2017-09-07 DIAGNOSIS — M4316 Spondylolisthesis, lumbar region: Secondary | ICD-10-CM | POA: Diagnosis not present

## 2017-09-07 HISTORY — DX: Other specified disorders of bone density and structure, unspecified site: M85.80

## 2017-09-07 SURGERY — POSTERIOR LUMBAR FUSION 2 LEVEL
Anesthesia: General | Site: Back

## 2017-09-07 MED ORDER — MIDAZOLAM HCL 5 MG/5ML IJ SOLN
INTRAMUSCULAR | Status: DC | PRN
  Administered 2017-09-07: 2 mg via INTRAVENOUS

## 2017-09-07 MED ORDER — ONDANSETRON HCL 4 MG PO TABS: 4.0000 mg | ORAL_TABLET | Freq: Four times a day (QID) | ORAL | Status: DC | PRN

## 2017-09-07 MED ORDER — CHLORHEXIDINE GLUCONATE CLOTH 2 % EX PADS: 6.0000 | MEDICATED_PAD | Freq: Once | CUTANEOUS | Status: DC

## 2017-09-07 MED ORDER — ONDANSETRON HCL 4 MG/2ML IJ SOLN
4.0000 mg | Freq: Four times a day (QID) | INTRAMUSCULAR | Status: DC | PRN
Start: 2017-09-07 — End: 2017-09-08

## 2017-09-07 MED ORDER — FENTANYL CITRATE (PF) 250 MCG/5ML IJ SOLN
INTRAMUSCULAR | Status: AC
  Filled 2017-09-07: qty 5

## 2017-09-07 MED ORDER — ONDANSETRON HCL 4 MG/2ML IJ SOLN
INTRAMUSCULAR | Status: DC | PRN
  Administered 2017-09-07: 4 mg via INTRAVENOUS

## 2017-09-07 MED ORDER — THROMBIN (RECOMBINANT) 5000 UNITS EX SOLR
OROMUCOSAL | Status: DC | PRN
  Administered 2017-09-07: 13:00:00 via TOPICAL

## 2017-09-07 MED ORDER — ACETAMINOPHEN 650 MG RE SUPP: 650.0000 mg | RECTAL | Status: DC | PRN

## 2017-09-07 MED ORDER — METHOCARBAMOL 500 MG PO TABS
ORAL_TABLET | ORAL | Status: AC
  Administered 2017-09-07: 500 mg via ORAL
  Filled 2017-09-07: qty 1

## 2017-09-07 MED ORDER — SODIUM CHLORIDE 0.9% FLUSH
3.0000 mL | Freq: Two times a day (BID) | INTRAVENOUS | Status: DC
  Administered 2017-09-07: 3 mL via INTRAVENOUS

## 2017-09-07 MED ORDER — FENTANYL CITRATE (PF) 100 MCG/2ML IJ SOLN
INTRAMUSCULAR | Status: AC
  Administered 2017-09-07: 50 ug via INTRAVENOUS
  Filled 2017-09-07: qty 2

## 2017-09-07 MED ORDER — BUPIVACAINE HCL (PF) 0.5 % IJ SOLN
INTRAMUSCULAR | Status: AC
  Filled 2017-09-07: qty 30

## 2017-09-07 MED ORDER — DOCUSATE SODIUM 100 MG PO CAPS
100.0000 mg | ORAL_CAPSULE | Freq: Two times a day (BID) | ORAL | Status: DC
  Administered 2017-09-07: 100 mg via ORAL
  Filled 2017-09-07: qty 1

## 2017-09-07 MED ORDER — THROMBIN (RECOMBINANT) 5000 UNITS EX SOLR
CUTANEOUS | Status: AC
  Filled 2017-09-07: qty 5000

## 2017-09-07 MED ORDER — MORPHINE SULFATE (PF) 4 MG/ML IV SOLN
4.0000 mg | INTRAVENOUS | Status: DC | PRN
  Administered 2017-09-08: 4 mg via INTRAVENOUS
  Filled 2017-09-07: qty 1

## 2017-09-07 MED ORDER — 0.9 % SODIUM CHLORIDE (POUR BTL) OPTIME
TOPICAL | Status: DC | PRN
  Administered 2017-09-07: 1000 mL

## 2017-09-07 MED ORDER — BUPIVACAINE HCL (PF) 0.5 % IJ SOLN
INTRAMUSCULAR | Status: DC | PRN
  Administered 2017-09-07: 5 mL

## 2017-09-07 MED ORDER — SENNA 8.6 MG PO TABS
1.0000 | ORAL_TABLET | Freq: Two times a day (BID) | ORAL | Status: DC
  Administered 2017-09-07: 8.6 mg via ORAL
  Filled 2017-09-07: qty 1

## 2017-09-07 MED ORDER — LIDOCAINE HCL (CARDIAC) 20 MG/ML IV SOLN
INTRAVENOUS | Status: DC | PRN
  Administered 2017-09-07: 80 mg via INTRAVENOUS

## 2017-09-07 MED ORDER — SUGAMMADEX SODIUM 200 MG/2ML IV SOLN
INTRAVENOUS | Status: DC | PRN
  Administered 2017-09-07: 200 mg via INTRAVENOUS

## 2017-09-07 MED ORDER — DEXAMETHASONE SODIUM PHOSPHATE 4 MG/ML IJ SOLN
INTRAMUSCULAR | Status: DC | PRN
  Administered 2017-09-07: 10 mg via INTRAVENOUS

## 2017-09-07 MED ORDER — LACTATED RINGERS IV SOLN
INTRAVENOUS | Status: DC
  Administered 2017-09-07: 19:00:00 via INTRAVENOUS

## 2017-09-07 MED ORDER — PHENOL 1.4 % MT LIQD: 1.0000 | OROMUCOSAL | Status: DC | PRN

## 2017-09-07 MED ORDER — HYDROCODONE-ACETAMINOPHEN 5-325 MG PO TABS
2.0000 | ORAL_TABLET | ORAL | Status: DC | PRN
  Administered 2017-09-08: 2 via ORAL
  Filled 2017-09-07: qty 2

## 2017-09-07 MED ORDER — KETOROLAC TROMETHAMINE 30 MG/ML IJ SOLN
INTRAMUSCULAR | Status: DC | PRN
  Administered 2017-09-07: 30 mg via INTRAVENOUS

## 2017-09-07 MED ORDER — KETOROLAC TROMETHAMINE 15 MG/ML IJ SOLN
15.0000 mg | Freq: Four times a day (QID) | INTRAMUSCULAR | Status: DC
  Administered 2017-09-07 – 2017-09-08 (×3): 15 mg via INTRAVENOUS
  Filled 2017-09-07 (×3): qty 1

## 2017-09-07 MED ORDER — LIDOCAINE-EPINEPHRINE 1 %-1:100000 IJ SOLN
INTRAMUSCULAR | Status: AC
  Filled 2017-09-07: qty 1

## 2017-09-07 MED ORDER — MENTHOL 3 MG MT LOZG: 1.0000 | LOZENGE | OROMUCOSAL | Status: DC | PRN

## 2017-09-07 MED ORDER — PROPOFOL 10 MG/ML IV BOLUS
INTRAVENOUS | Status: AC
  Filled 2017-09-07: qty 20

## 2017-09-07 MED ORDER — LIDOCAINE-EPINEPHRINE 1 %-1:100000 IJ SOLN
INTRAMUSCULAR | Status: DC | PRN
  Administered 2017-09-07: 5 mL

## 2017-09-07 MED ORDER — ALUM & MAG HYDROXIDE-SIMETH 200-200-20 MG/5ML PO SUSP: 30.0000 mL | Freq: Four times a day (QID) | ORAL | Status: DC | PRN

## 2017-09-07 MED ORDER — ROCURONIUM BROMIDE 100 MG/10ML IV SOLN
INTRAVENOUS | Status: DC | PRN
  Administered 2017-09-07 (×3): 20 mg via INTRAVENOUS
  Administered 2017-09-07: 60 mg via INTRAVENOUS

## 2017-09-07 MED ORDER — CEFAZOLIN SODIUM-DEXTROSE 2-4 GM/100ML-% IV SOLN
2.0000 g | Freq: Three times a day (TID) | INTRAVENOUS | Status: AC
  Administered 2017-09-07 – 2017-09-08 (×2): 2 g via INTRAVENOUS
  Filled 2017-09-07 (×2): qty 100

## 2017-09-07 MED ORDER — OXYCODONE HCL 5 MG PO TABS
ORAL_TABLET | ORAL | Status: AC
  Administered 2017-09-07: 10 mg via ORAL
  Filled 2017-09-07: qty 2

## 2017-09-07 MED ORDER — FENTANYL CITRATE (PF) 100 MCG/2ML IJ SOLN
INTRAMUSCULAR | Status: DC | PRN
  Administered 2017-09-07 (×2): 50 ug via INTRAVENOUS
  Administered 2017-09-07: 100 ug via INTRAVENOUS
  Administered 2017-09-07 (×2): 50 ug via INTRAVENOUS

## 2017-09-07 MED ORDER — FLEET ENEMA 7-19 GM/118ML RE ENEM: 1.0000 | ENEMA | Freq: Once | RECTAL | Status: DC | PRN

## 2017-09-07 MED ORDER — NICOTINE 14 MG/24HR TD PT24
14.0000 mg | MEDICATED_PATCH | Freq: Every day | TRANSDERMAL | Status: DC
  Administered 2017-09-07: 14 mg via TRANSDERMAL
  Filled 2017-09-07: qty 1

## 2017-09-07 MED ORDER — BISACODYL 10 MG RE SUPP: 10.0000 mg | Freq: Every day | RECTAL | Status: DC | PRN

## 2017-09-07 MED ORDER — METHOCARBAMOL 500 MG PO TABS
500.0000 mg | ORAL_TABLET | Freq: Four times a day (QID) | ORAL | Status: DC | PRN
  Administered 2017-09-07 (×2): 500 mg via ORAL
  Filled 2017-09-07 (×2): qty 1

## 2017-09-07 MED ORDER — OXYCODONE HCL 5 MG/5ML PO SOLN: 5.0000 mg | Freq: Once | ORAL | Status: DC | PRN

## 2017-09-07 MED ORDER — POLYETHYLENE GLYCOL 3350 17 G PO PACK: 17.0000 g | PACK | Freq: Every day | ORAL | Status: DC | PRN

## 2017-09-07 MED ORDER — HYDROCODONE-ACETAMINOPHEN 5-325 MG PO TABS: 1.0000 | ORAL_TABLET | ORAL | Status: DC | PRN

## 2017-09-07 MED ORDER — LACTATED RINGERS IV SOLN
INTRAVENOUS | Status: DC
Start: 2017-09-07 — End: 2017-09-07
  Administered 2017-09-07 (×3): via INTRAVENOUS

## 2017-09-07 MED ORDER — MIDAZOLAM HCL 2 MG/2ML IJ SOLN
INTRAMUSCULAR | Status: AC
  Filled 2017-09-07: qty 2

## 2017-09-07 MED ORDER — ACETAMINOPHEN 325 MG PO TABS: 650.0000 mg | ORAL_TABLET | ORAL | Status: DC | PRN

## 2017-09-07 MED ORDER — FENTANYL CITRATE (PF) 100 MCG/2ML IJ SOLN
25.0000 ug | INTRAMUSCULAR | Status: DC | PRN
  Administered 2017-09-07 (×3): 50 ug via INTRAVENOUS

## 2017-09-07 MED ORDER — METHOCARBAMOL 1000 MG/10ML IJ SOLN
500.0000 mg | Freq: Four times a day (QID) | INTRAVENOUS | Status: DC | PRN
  Filled 2017-09-07: qty 5

## 2017-09-07 MED ORDER — PROPOFOL 10 MG/ML IV BOLUS
INTRAVENOUS | Status: DC | PRN
  Administered 2017-09-07: 150 mg via INTRAVENOUS

## 2017-09-07 MED ORDER — OXYCODONE HCL 5 MG PO TABS
5.0000 mg | ORAL_TABLET | ORAL | Status: DC | PRN
  Administered 2017-09-07 – 2017-09-08 (×5): 10 mg via ORAL
  Filled 2017-09-07 (×4): qty 2

## 2017-09-07 MED ORDER — BACITRACIN 50000 UNITS IM SOLR
INTRAMUSCULAR | Status: DC | PRN
  Administered 2017-09-07: 13:00:00

## 2017-09-07 MED ORDER — SODIUM CHLORIDE 0.9% FLUSH: 3.0000 mL | INTRAVENOUS | Status: DC | PRN

## 2017-09-07 MED ORDER — DEXMEDETOMIDINE HCL IN NACL 200 MCG/50ML IV SOLN
INTRAVENOUS | Status: DC | PRN
  Administered 2017-09-07: .4 ug/kg/h via INTRAVENOUS

## 2017-09-07 MED ORDER — OXYCODONE HCL 5 MG PO TABS: 5.0000 mg | ORAL_TABLET | Freq: Once | ORAL | Status: DC | PRN

## 2017-09-07 SURGICAL SUPPLY — 80 items
ADH SKN CLS APL DERMABOND .7 (GAUZE/BANDAGES/DRESSINGS) ×1
APL SRG 60D 8 XTD TIP BNDBL (TIP)
BAG DECANTER FOR FLEXI CONT (MISCELLANEOUS) ×3 IMPLANT
BASKET BONE COLLECTION (BASKET) ×3 IMPLANT
BLADE CLIPPER SURG (BLADE) IMPLANT
BONE CANC CHIPS 40CC CAN1/2 (Bone Implant) ×3 IMPLANT
BUR MATCHSTICK NEURO 3.0 LAGG (BURR) ×3 IMPLANT
CANISTER SUCT 3000ML PPV (MISCELLANEOUS) ×3 IMPLANT
CARTRIDGE OIL MAESTRO DRILL (MISCELLANEOUS) ×1 IMPLANT
CHIPS CANC BONE 40CC CAN1/2 (Bone Implant) ×1 IMPLANT
CONN ADJ TRANSVERSE 41-51X5.5 (Connector) ×3 IMPLANT
CONNECTOR ADJ TRNSVR 41-51X5.5 (Connector) IMPLANT
CONT SPEC 4OZ CLIKSEAL STRL BL (MISCELLANEOUS) ×5 IMPLANT
COVER BACK TABLE 60X90IN (DRAPES) ×3 IMPLANT
DECANTER SPIKE VIAL GLASS SM (MISCELLANEOUS) ×3 IMPLANT
DERMABOND ADVANCED (GAUZE/BANDAGES/DRESSINGS) ×2
DERMABOND ADVANCED .7 DNX12 (GAUZE/BANDAGES/DRESSINGS) ×1 IMPLANT
DEVICE DISSECT PLASMABLAD 3.0S (MISCELLANEOUS) ×1 IMPLANT
DIFFUSER DRILL AIR PNEUMATIC (MISCELLANEOUS) ×1 IMPLANT
DRAPE C-ARM 42X72 X-RAY (DRAPES) ×6 IMPLANT
DRAPE HALF SHEET 40X57 (DRAPES) IMPLANT
DRAPE LAPAROTOMY 100X72X124 (DRAPES) ×3 IMPLANT
DRAPE POUCH INSTRU U-SHP 10X18 (DRAPES) ×3 IMPLANT
DRSG OPSITE POSTOP 4X8 (GAUZE/BANDAGES/DRESSINGS) ×2 IMPLANT
DRSG TELFA 3X8 NADH (GAUZE/BANDAGES/DRESSINGS) ×3 IMPLANT
DURAPREP 26ML APPLICATOR (WOUND CARE) ×3 IMPLANT
DURASEAL APPLICATOR TIP (TIP) IMPLANT
DURASEAL SPINE SEALANT 3ML (MISCELLANEOUS) IMPLANT
ELECT REM PT RETURN 9FT ADLT (ELECTROSURGICAL) ×3
ELECTRODE REM PT RTRN 9FT ADLT (ELECTROSURGICAL) ×1 IMPLANT
GAUZE SPONGE 4X4 12PLY STRL (GAUZE/BANDAGES/DRESSINGS) ×3 IMPLANT
GAUZE SPONGE 4X4 16PLY XRAY LF (GAUZE/BANDAGES/DRESSINGS) ×2 IMPLANT
GLOVE BIO SURGEON STRL SZ8 (GLOVE) ×2 IMPLANT
GLOVE BIOGEL PI IND STRL 6.5 (GLOVE) IMPLANT
GLOVE BIOGEL PI IND STRL 7.0 (GLOVE) IMPLANT
GLOVE BIOGEL PI IND STRL 8.5 (GLOVE) ×2 IMPLANT
GLOVE BIOGEL PI INDICATOR 6.5 (GLOVE) ×2
GLOVE BIOGEL PI INDICATOR 7.0 (GLOVE) ×2
GLOVE BIOGEL PI INDICATOR 8.5 (GLOVE) ×4
GLOVE ECLIPSE 8.5 STRL (GLOVE) ×6 IMPLANT
GLOVE INDICATOR 8.5 STRL (GLOVE) ×2 IMPLANT
GLOVE SURG SS PI 6.0 STRL IVOR (GLOVE) ×2 IMPLANT
GOWN STRL REUS W/ TWL LRG LVL3 (GOWN DISPOSABLE) IMPLANT
GOWN STRL REUS W/ TWL XL LVL3 (GOWN DISPOSABLE) IMPLANT
GOWN STRL REUS W/TWL 2XL LVL3 (GOWN DISPOSABLE) ×6 IMPLANT
GOWN STRL REUS W/TWL LRG LVL3 (GOWN DISPOSABLE) ×3
GOWN STRL REUS W/TWL XL LVL3 (GOWN DISPOSABLE)
GRAFT BNE CHIP CANC 1-8 40 (Bone Implant) IMPLANT
HEMOSTAT POWDER KIT SURGIFOAM (HEMOSTASIS) ×2 IMPLANT
KIT BASIN OR (CUSTOM PROCEDURE TRAY) ×3 IMPLANT
KIT INFUSE MEDIUM (Orthopedic Implant) ×2 IMPLANT
KIT ROOM TURNOVER OR (KITS) ×3 IMPLANT
MILL MEDIUM DISP (BLADE) ×3 IMPLANT
NDL SPNL 18GX3.5 QUINCKE PK (NEEDLE) IMPLANT
NEEDLE HYPO 22GX1.5 SAFETY (NEEDLE) ×3 IMPLANT
NEEDLE SPNL 18GX3.5 QUINCKE PK (NEEDLE) IMPLANT
NS IRRIG 1000ML POUR BTL (IV SOLUTION) ×3 IMPLANT
OIL CARTRIDGE MAESTRO DRILL (MISCELLANEOUS)
PACK LAMINECTOMY NEURO (CUSTOM PROCEDURE TRAY) ×3 IMPLANT
PAD ARMBOARD 7.5X6 YLW CONV (MISCELLANEOUS) ×9 IMPLANT
PAD DRESSING TELFA 3X8 NADH (GAUZE/BANDAGES/DRESSINGS) IMPLANT
PATTIES SURGICAL .5 X1 (DISPOSABLE) ×3 IMPLANT
PLASMABLADE 3.0S (MISCELLANEOUS) ×3
SCREW POLYAXIAL 8.5X50 (Screw) ×4 IMPLANT
SCREW VITALITY PA 7.5X45MM (Screw) ×4 IMPLANT
SPONGE LAP 4X18 X RAY DECT (DISPOSABLE) IMPLANT
SPONGE SURGIFOAM ABS GEL 100 (HEMOSTASIS) ×3 IMPLANT
SUT PROLENE 6 0 BV (SUTURE) IMPLANT
SUT VIC AB 1 CT1 18XBRD ANBCTR (SUTURE) ×1 IMPLANT
SUT VIC AB 1 CT1 8-18 (SUTURE) ×6
SUT VIC AB 2-0 CP2 18 (SUTURE) ×5 IMPLANT
SUT VIC AB 3-0 SH 8-18 (SUTURE) ×3 IMPLANT
SWAB COLLECTION DEVICE MRSA (MISCELLANEOUS) ×2 IMPLANT
SWAB CULTURE ESWAB REG 1ML (MISCELLANEOUS) ×2 IMPLANT
SYR 3ML LL SCALE MARK (SYRINGE) ×12 IMPLANT
SYR 5ML LL (SYRINGE) IMPLANT
TOWEL GREEN STERILE (TOWEL DISPOSABLE) ×3 IMPLANT
TOWEL GREEN STERILE FF (TOWEL DISPOSABLE) ×3 IMPLANT
TRAY FOLEY W/METER SILVER 16FR (SET/KITS/TRAYS/PACK) ×3 IMPLANT
WATER STERILE IRR 1000ML POUR (IV SOLUTION) ×3 IMPLANT

## 2017-09-07 NOTE — Anesthesia Preprocedure Evaluation (Addendum)
Anesthesia Evaluation  Patient identified by MRN, date of birth, ID band Patient awake    Reviewed: Allergy & Precautions, NPO status , Patient's Chart, lab work & pertinent test results  History of Anesthesia Complications Negative for: history of anesthetic complications  Airway Mallampati: II  TM Distance: >3 FB Neck ROM: Full    Dental  (+) Teeth Intact   Pulmonary shortness of breath, former smoker,    breath sounds clear to auscultation       Cardiovascular negative cardio ROS   Rhythm:Regular     Neuro/Psych  Headaches, neg Seizures negative psych ROS   GI/Hepatic negative GI ROS, (+) Hepatitis -Hep c treated and curred   Endo/Other  negative endocrine ROS  Renal/GU negative Renal ROS     Musculoskeletal   Abdominal   Peds  Hematology negative hematology ROS (+)   Anesthesia Other Findings   Reproductive/Obstetrics                             Anesthesia Physical Anesthesia Plan  ASA: II  Anesthesia Plan: General   Post-op Pain Management:    Induction: Intravenous  PONV Risk Score and Plan: 2 and Ondansetron and Dexamethasone  Airway Management Planned: Oral ETT  Additional Equipment: None  Intra-op Plan:   Post-operative Plan: Extubation in OR  Informed Consent: I have reviewed the patients History and Physical, chart, labs and discussed the procedure including the risks, benefits and alternatives for the proposed anesthesia with the patient or authorized representative who has indicated his/her understanding and acceptance.   Dental advisory given  Plan Discussed with: CRNA and Surgeon  Anesthesia Plan Comments:         Anesthesia Quick Evaluation

## 2017-09-07 NOTE — Op Note (Signed)
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 Surgeon: Kristeen Miss First assistant: Francesca Jewett M.D. Anesthesia: Gen. endotracheal Indications: [name] [name] is a 66 year old individual who's had significant back pain. He's had a fusion in July 2018 for the first month he felt better however recent started deteriorate with substantial positional back pain and difficulty moving with simple little movement such as turning in bed ultimately a CT scan demonstrated a presence of a pseudoarthrosis at the level of L3-L5 with loosening significantly of the L5 screws and possibly of the L3 screws. Having failed efforts at conservative management and requiring increasing doses of narcotic pain medication is advised regarding the need for surgical revision of his pseudoarthrosis.  Procedure: The patient was brought to the operating supine on a stretcher. After the smooth induction of general endotracheal anesthesia he was carefully turned prone. The back was prepped without wall DuraPrep and draped in a sterile fashion. The previously made midline incision was reopened and carried down to the lumbar dorsal fascia. Fascia was opened on either side of midline to expose the some grumous gelatinous mass in the lateral gutters around the hardware. Cultures were obtained as there was some cloudy fluid in the region and a stat Gram stain was obtained which yielded no evidence of any organisms on a few white cells being seen. Samples of the tissue were sent for pathologic evaluation. The dissection was then taken out laterally to expose the hardware in left and right sides. Once adequately expose the hardware was removed removing the screw caps first then the rod and then palpating  each rod individually the L5 screws weren't notably loose and came directly without having to be unscrewed. The L3 screws have a slight degree of looseness in them but did require a screwdriver to unscrew them the L4 screws felt normally tight thin the bone and no significant slack was noted. The area around the laminotomies was dissected but there was thick dense scar tissue making the dissection very difficult. This felt at this point that'll be best to reinforce posterior lateral arthrodesis with infuse and allograft and not venture to explore the interspaces were there was evidence of pseudoarthrosis at both L3-4 and L4-5. With that then the L5 screw holes were sized for a 0.5 x 50 mm screws which felt tightly into the holes 7.5 x 45 mm screws were used and L3. 6.5 x 45 mm screws were used in L4. Lateral gutters were carefully decorticated packed away and then strips of infuse were placed into the lateral gutters from L3 to L5. The pedicle screws were placed into the pedicles. And then of the previously placed rods were placed into the saddles of the screws and their tightening neutral construct. The remainder of allograft was packed into the lateral gutters over the infuse. Final radiographs were obtained in AP and lateral projection and with hemostasis and the soft tissues being obtained the lumbar dorsal fascia was closed with #1 Vicryl interrupted fashion, 2-0 Vicryl subcutaneous tissues, 3-0 Vicryl subcuticularly. Dermabond was placed on the skin. Blood loss is estimated 200 mL. Patient was returned to recovery room in stable condition.

## 2017-09-07 NOTE — Progress Notes (Signed)
Orthopedic Tech Progress Note Patient Details:  [name] [name] [birth year] [id number] Called [employer] for brace. Patient ID: [name] [name], male   DOB: [birth year], 66 y.o.   MRN: [id number]   [name] 09/07/2017, 4:23 PM

## 2017-09-07 NOTE — Transfer of Care (Signed)
Immediate Anesthesia Transfer of Care Note  Patient: [name] [name]  Procedure(s) Performed: Lumbar Three-Four Lumbar Four- Five Posterior lumbar interbody fusion with revision of arthrodesis with allograft/Infuse (N/A Back)  Patient Location: PACU  Anesthesia Type:General  Level of Consciousness: awake, oriented and patient cooperative  Airway & Oxygen Therapy: Patient Spontanous Breathing and Patient connected to nasal cannula oxygen  Post-op Assessment: Report given to RN and Post -op Vital signs reviewed and stable  Post vital signs: Reviewed and stable  Last Vitals:  Vitals:   09/07/17 1015 09/07/17 1434  BP: (!) 141/71   Pulse: 86   Resp: 18   Temp: 36.4 C (P) 36.6 C  SpO2: 95%     Last Pain:  Vitals:   09/07/17 1434  TempSrc:   PainSc: (P) Asleep         Complications: No apparent anesthesia complications

## 2017-09-07 NOTE — Anesthesia Procedure Notes (Signed)
Procedure Name: Intubation Date/Time: 09/07/2017 11:51 AM Performed by: Audry Pili, MD Pre-anesthesia Checklist: Patient identified, Emergency Drugs available, Suction available and Patient being monitored Patient Re-evaluated:Patient Re-evaluated prior to induction Oxygen Delivery Method: Circle system utilized Preoxygenation: Pre-oxygenation with 100% oxygen Induction Type: IV induction Ventilation: Mask ventilation without difficulty Laryngoscope Size: Miller and 3 Grade View: Grade I Tube type: Oral Tube size: 7.5 mm Number of attempts: 2 (First attempt by CRNA with Mac blade, esophageal intubation. Second attempt by Dr. Fransisco Beau with Mil 3, success.) Airway Equipment and Method: Stylet and Oral airway Placement Confirmation: ETT inserted through vocal cords under direct vision,  positive ETCO2 and breath sounds checked- equal and bilateral Tube secured with: Tape Dental Injury: Teeth and Oropharynx as per pre-operative assessment

## 2017-09-07 NOTE — H&P (Signed)
[name] [name] is an 66 y.o. male.   Chief Complaint: Back pain status post arthrodesis July 2018 HPI: 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 In July 19 underwent surgical decompression stabilization from L3-L5. Initially he seemed to be doing quite well but after a few weeks he developed a sudden pain in his back is persistent continue to worsen with any slight movement ultimately radiographs demonstrate that he has now developed a solid arthrodesis at L3-4 or at L4-5 he has loosening of the hardware particularly at L5 and even slight pain tends to aggravate substantial movement form. After myelogram post myelogram CAT scan I indicated that the patient would need to undergo revision of his arthrodesis and effort to form solid fusion the posterolateral regions from L3-L5. He is now admitted for this procedure.  Past Medical History:  Diagnosis Date  . Arthritis   . Cataracts, bilateral    very mild  . Headache 2011   HX OF VISUAL MIGRAINES - NO PAIN --MIGRAINES ATTRIBUTED TO PT CUTTING NICOTINE PATCH IN HALF BEFORE APPLYING TO HIS SKIN.    Marland Kitchen Hepatitis 2001   C -treated and no problems since  2001 'cured'  . Osteopenia   . Pain    KNEE OA  . Shortness of breath dyspnea    "SOMETIMES FEELS LIKE I CAN'T CATCH A DEEP BREATH "- ON GOING FOR YEARS - [place] WORK UP 2011 - [facility]  . Stroke ([place])    HX POSSIBLE TIA - RT LEG NUMBNESS AND TINGLING FINGERS AND NUMBNESS RT FACE - ALL RESOLVED ; STATES NEUROLOGIST COULD NOT FIND ANY EVIDENCE OF STROKE - THOUGHT PT'S EPISODE MIGHT BE FROM CUTTING NICOTINE PATCH IN HALF AND APPLYING TO SKIN  . Trigger finger    LEFT MIDDLE FINGER    Past Surgical History:  Procedure Laterality Date  . JOINT REPLACEMENT    . left ankle surgery     fractured  . left knee surgery     MENISCAL REMOVED  . left shoulder     torn ligaments left shoulder   . RIGHT CARPAL TUNNEL RELEASE  2011  . RIGHT KNEE ARTHROSCOPY   AUG OR SEPT 2015   AT [facility]  . ROOT CANAL    . TONSILLECTOMY     as child  . 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 TOTAL KNEE ARTHROPLASTY Right 07/21/2014   Procedure: RIGHT TOTAL KNEE ARTHROPLASTY;  Surgeon: Mauri Pole, MD;  Location: [facility];  Service: Orthopedics;  Laterality: Right;  . TRIGGER FINGER RELEASE Left     History reviewed. No pertinent family history. Social History:  reports that he quit smoking about 5 years ago. he has never used smokeless tobacco. He reports that he drinks about 4.2 oz of alcohol per week. He reports that he uses drugs. Drug: Marijuana.  Allergies:  Allergies  Allergen Reactions  . Statins Other (See Comments)    MYALGIAS    Medications Prior to Admission  Medication Sig Dispense Refill  . aspirin 325 MG tablet Take 650-975 mg by mouth every 4 (four) hours as needed for mild pain.     . Cholecalciferol (EQL VITAMIN D3) 2000 units CAPS Take 2,000 Units by mouth daily.    Marland Kitchen ibuprofen (  ADVIL,MOTRIN) 200 MG tablet Take 600 mg by mouth every 6 (six) hours as needed for headache or mild pain.     . nicotine (NICODERM CQ - DOSED IN MG/24 HOURS) 14 mg/24hr patch Place 14 mg onto the skin daily.     Marland Kitchen oxyCODONE (ROXICODONE) 5 MG immediate release tablet One or two tablets as needed every four hrs for pain (Patient taking differently: Take 5-10 mg by mouth every 4 (four) hours as needed for moderate pain. ) 60 tablet 0  . baclofen (LIORESAL) 20 MG tablet Take 20 mg by mouth 3 (three) times daily as needed for muscle spasms.     . diazepam (VALIUM) 5 MG tablet Take 1 tablet (5 mg total) by mouth every 6 (six) hours as needed for muscle spasms (IF ROBAXIN IS INEFFECTIVE). (Patient not taking: Reported on 07/12/2017) 40 tablet 0    No  results found for this or any previous visit (from the past 48 hour(s)). No results found.  Review of Systems  Constitutional: Positive for malaise/fatigue.  HENT: Negative.   Eyes: Negative.   Respiratory: Negative.   Cardiovascular: Negative.   Musculoskeletal: Positive for back pain.  Skin: Negative.   Neurological: Positive for sensory change and focal weakness.  Endo/Heme/Allergies: Negative.   Psychiatric/Behavioral: Negative.     Blood pressure (!) 141/71, pulse 86, temperature 97.6 F (36.4 C), temperature source Oral, resp. rate 18, height 6' 0.5" (1.842 m), weight 91.9 kg (202 lb 9.6 oz), SpO2 95 %. Physical Exam  Constitutional: He is oriented to person, place, and time. He appears well-developed and well-nourished.  HENT:  Head: Normocephalic and atraumatic.  Eyes: Conjunctivae and EOM are normal. Pupils are equal, round, and reactive to light.  Neck: Normal range of motion. Neck supple.  Cardiovascular: Normal rate.  Respiratory: Effort normal and breath sounds normal.  GI: Soft. Bowel sounds are normal.  Musculoskeletal:  Straight leg raising is positive at 15 in either lower extremity Patrick's maneuver is negative bilaterally. Palpation and percussion of his back is diffusely tender throughout the lower lumbar spine.  Neurological: He is alert and oriented to person, place, and time.  Motor strength is intact in iliopsoas quadriceps tibialis anterior and gastrocs. Deep 10 reflexes are absent in the patellae and Achilles. Upper extremity strength and reflexes are normal. Cranial nerve examination is within the limits of normal.  Skin: Skin is warm and dry.  Psychiatric: He has a normal mood and affect. His behavior is normal. Judgment and thought content normal.     Assessment/Plan Pseudoarthrosis L3-4 and L4-5. Revision of arthrodesis with revision of fixation and posterior lateral arthrodesis with local autograft allograft and infuse.  Earleen Newport,  MD 09/07/2017, 11:02 AM

## 2017-09-07 NOTE — Progress Notes (Signed)
Orthopedic Tech Progress Note Patient Details:  [name] [name] [birth year] [id number] Patient already has brace. Patient ID: [name] [name], male   DOB: [birth year], 66 y.o.   MRN: [id number]   [name] [name] 09/07/2017, 5:36 PM

## 2017-09-08 MED ORDER — OXYCODONE HCL 5 MG PO TABS: 5.0000 mg | ORAL_TABLET | ORAL | 0 refills | Status: DC | PRN

## 2017-09-08 MED ORDER — DIAZEPAM 5 MG PO TABS
5.0000 mg | ORAL_TABLET | Freq: Once | ORAL | Status: AC
  Administered 2017-09-08: 5 mg via ORAL
  Filled 2017-09-08: qty 1

## 2017-09-08 MED ORDER — DIAZEPAM 5 MG PO TABS
5.0000 mg | ORAL_TABLET | Freq: Four times a day (QID) | ORAL | Status: DC | PRN
  Administered 2017-09-08: 5 mg via ORAL
  Filled 2017-09-08: qty 1

## 2017-09-08 MED ORDER — METHOCARBAMOL 500 MG PO TABS: 500.0000 mg | ORAL_TABLET | Freq: Four times a day (QID) | ORAL | 2 refills | Status: DC | PRN

## 2017-09-08 NOTE — Discharge Instructions (Signed)

## 2017-09-08 NOTE — Evaluation (Signed)
Physical Therapy Evaluation and Discharge Patient Details Name: [name] [name] MRN: [id number] DOB: 03/22/1951 Today's Date: 09/08/2017   History of Present Illness  Pt is a 66 y/o male who presents s/p L3-L5 PLIF on 09/07/17.   Clinical Impression  Patient evaluated by Physical Therapy with no further acute PT needs identified. All education has been completed and the patient has no further questions. At the time of PT eval pt mobilizing at a mod I level. He reports confidence in all mobility and maintenance of precautions. Overall pt was flexed during session but was able to make corrective changes with cues. See below for any follow-up Physical Therapy or equipment needs. PT is signing off. Thank you for this referral.     Follow Up Recommendations No PT follow up;Supervision - Intermittent    Equipment Recommendations  None recommended by PT    Recommendations for Other Services       Precautions / Restrictions Precautions Precautions: Fall;Back Precaution Booklet Issued: Yes (comment) Precaution Comments: Reviewed handout. Pt was cued for precautions during functional activity.  Required Braces or Orthoses: Spinal Brace Spinal Brace: Lumbar corset;Applied in sitting position Restrictions Weight Bearing Restrictions: No      Mobility  Bed Mobility Overal bed mobility: Modified Independent             General bed mobility comments: Performed log roll well without rails. HOB flat to simulate home environment.   Transfers Overall transfer level: Modified independent Equipment used: None             General transfer comment: No assist to power-up to full stand. No unsteadiness noted.   Ambulation/Gait Ambulation/Gait assistance: Modified independent (Device/Increase time) Ambulation Distance (Feet): 400 Feet Assistive device: None Gait Pattern/deviations: Step-through pattern;Decreased stride length;Trunk flexed Gait velocity: Decreased Gait velocity  interpretation: Below normal speed for age/gender General Gait Details: Slow and guarded due to pain but overall pt steady and did not require assistance.   Stairs         General stair comments: Pt declined stair training.   Wheelchair Mobility    Modified Rankin (Stroke Patients Only)       Balance Overall balance assessment: Needs assistance Sitting-balance support: Feet supported;No upper extremity supported Sitting balance-Leahy Scale: Good     Standing balance support: No upper extremity supported;During functional activity Standing balance-Leahy Scale: Fair                               Pertinent Vitals/Pain Pain Assessment: Faces Faces Pain Scale: Hurts little more Pain Location: Incision site Pain Descriptors / Indicators: Operative site guarding;Discomfort Pain Intervention(s): Limited activity within patient's tolerance;Monitored during session;Repositioned    Home Living Family/patient expects to be discharged to:: Private residence Living Arrangements: Spouse/significant other Available Help at Discharge: Family Type of Home: House Home Access: Stairs to enter Entrance Stairs-Rails: Right;Left;Can reach both Entrance Stairs-Number of Steps: 5 Home Layout: One level Home Equipment: Environmental consultant - 2 wheels;Bedside commode      Prior Function Level of Independence: Independent               Hand Dominance   Dominant Hand: Right    Extremity/Trunk Assessment   Upper Extremity Assessment Upper Extremity Assessment: Overall WFL for tasks assessed    Lower Extremity Assessment Lower Extremity Assessment: Generalized weakness(Consistent with pre-op diagnosis)    Cervical / Trunk Assessment Cervical / Trunk Assessment: Other exceptions Cervical / Trunk Exceptions: s/p surgery  Communication   Communication: No difficulties  Cognition Arousal/Alertness: Awake/alert(Sleepy) Behavior During Therapy: WFL for tasks  assessed/performed Overall Cognitive Status: Within Functional Limits for tasks assessed                                        General Comments      Exercises     Assessment/Plan    PT Assessment Patent does not need any further PT services  PT Problem List         PT Treatment Interventions      PT Goals (Current goals can be found in the Care Plan section)  Acute Rehab PT Goals PT Goal Formulation: All assessment and education complete, DC therapy    Frequency     Barriers to discharge        Co-evaluation               AM-PAC PT "6 Clicks" Daily Activity  Outcome Measure Difficulty turning over in bed (including adjusting bedclothes, sheets and blankets)?: None Difficulty moving from lying on back to sitting on the side of the bed? : None Difficulty sitting down on and standing up from a chair with arms (e.g., wheelchair, bedside commode, etc,.)?: None Help needed moving to and from a bed to chair (including a wheelchair)?: None Help needed walking in hospital room?: None Help needed climbing 3-5 steps with a railing? : A Little 6 Click Score: 23    End of Session Equipment Utilized During Treatment: Back brace Activity Tolerance: Patient tolerated treatment well Patient left: in bed;with call bell/phone within reach Nurse Communication: Mobility status PT Visit Diagnosis: Pain;Other symptoms and signs involving the nervous system (R29.898) Pain - part of body: (back)    Time: 0700-0715 PT Time Calculation (min) (ACUTE ONLY): 15 min   Charges:   PT Evaluation $PT Eval Low Complexity: 1 Low     PT G Codes:        Rolinda Roan, PT, DPT Acute Rehabilitation Services Pager: [place] 09/08/2017, 8:22 AM

## 2017-09-08 NOTE — Anesthesia Postprocedure Evaluation (Signed)
Anesthesia Post Note  Patient: [name] [name]  Procedure(s) Performed: Lumbar Three-Four Lumbar Four- Five Posterior lumbar interbody fusion with revision of arthrodesis with allograft/Infuse (N/A Back)     Patient location during evaluation: PACU Anesthesia Type: General Level of consciousness: awake and alert Pain management: pain level controlled Vital Signs Assessment: post-procedure vital signs reviewed and stable Respiratory status: spontaneous breathing, nonlabored ventilation, respiratory function stable and patient connected to nasal cannula oxygen Cardiovascular status: blood pressure returned to baseline and stable Postop Assessment: no apparent nausea or vomiting Anesthetic complications: no    Last Vitals:  Vitals:   09/08/17 0400 09/08/17 0811  BP: 131/80 (!) 162/81  Pulse: 90 83  Resp: 18 18  Temp: 37.1 C 36.9 C  SpO2: 94% 95%    Last Pain:  Vitals:   09/08/17 0811  TempSrc: Oral  PainSc:                  [name]

## 2017-09-08 NOTE — Evaluation (Signed)
Occupational Therapy Evaluation and Discharge Patient Details Name: [name] [name] MRN: [id number] DOB: 1951-06-09 Today's Date: 09/08/2017    History of Present Illness Pt is a 66 y/o male who presents s/p L3-L5 PLIF on 09/07/17.    Clinical Impression   Pt reports he was independent with ADL PTA. Currently pt mod I with ADL and functional mobility. All back, safety, and ADL education completed with pt. Pt planning to d/c home with 24/7 supervision from family. No further acute OT needs identified; signing off at this time. Please re-consult if needs change. Thank you for this referral.    Follow Up Recommendations  No OT follow up    Equipment Recommendations  None recommended by OT    Recommendations for Other Services       Precautions / Restrictions Precautions Precautions: Fall;Back Precaution Booklet Issued: No Precaution Comments: Reviewed back precautions with pt. Required Braces or Orthoses: Spinal Brace Spinal Brace: Lumbar corset;Applied in sitting position Restrictions Weight Bearing Restrictions: No      Mobility Bed Mobility              General bed mobility comments: Pt sitting EOB upon arrival.  Transfers Overall transfer level: Modified independent Equipment used: None             General transfer comment: No assist to power-up to full stand. No unsteadiness noted.     Balance Overall balance assessment: No apparent balance deficits (not formally assessed)                              ADL either performed or assessed with clinical judgement   ADL Overall ADL's : Modified independent                                       General ADL Comments: Educated pt on use of 2 cups for oral care, compensatory strategies for LB ADL, keeping frequently used items at counter top height, frequent mobility thorughout the day upon return home, brace management and wear.     Vision         Perception     Praxis      Pertinent Vitals/Pain Pain Assessment: Faces Faces Pain Scale: Hurts little more Pain Location: back Pain Descriptors / Indicators: Sore Pain Intervention(s): Monitored during session;Limited activity within patient's tolerance     Hand Dominance Right   Extremity/Trunk Assessment Upper Extremity Assessment Upper Extremity Assessment: Overall WFL for tasks assessed   Lower Extremity Assessment Lower Extremity Assessment: Defer to PT evaluation   Cervical / Trunk Assessment Cervical / Trunk Assessment: Other exceptions Cervical / Trunk Exceptions: s/p surgery   Communication Communication Communication: No difficulties   Cognition Arousal/Alertness: Awake/alert Behavior During Therapy: Agitated Overall Cognitive Status: Within Functional Limits for tasks assessed                                     General Comments       Exercises     Shoulder Instructions      Home Living Family/patient expects to be discharged to:: Private residence Living Arrangements: Spouse/significant other Available Help at Discharge: Family Type of Home: House Home Access: Stairs to enter Technical brewer of Steps: 5 Entrance Stairs-Rails: Right;Left;Can reach both Home Layout: One level  Bathroom Shower/Tub: Occupational psychologist: Handicapped height     Home Equipment: Environmental consultant - 2 wheels;Bedside commode;Adaptive equipment Adaptive Equipment: Reacher;Sock aid        Prior Functioning/Environment Level of Independence: Independent                 OT Problem List:        OT Treatment/Interventions:      OT Goals(Current goals can be found in the care plan section) Acute Rehab OT Goals Patient Stated Goal: go home OT Goal Formulation: All assessment and education complete, DC therapy  OT Frequency:     Barriers to D/C:            Co-evaluation              AM-PAC PT "6 Clicks" Daily Activity     Outcome Measure Help  from another person eating meals?: None Help from another person taking care of personal grooming?: None Help from another person toileting, which includes using toliet, bedpan, or urinal?: None Help from another person bathing (including washing, rinsing, drying)?: None Help from another person to put on and taking off regular upper body clothing?: None Help from another person to put on and taking off regular lower body clothing?: None 6 Click Score: 24   End of Session Equipment Utilized During Treatment: Back brace Nurse Communication: Mobility status;Other (comment)(no equipment or f/u needs)  Activity Tolerance: Patient tolerated treatment well Patient left: Other (comment)(sitting EOB)  OT Visit Diagnosis: Pain Pain - part of body: (back)                Time: 7741-4239 OT Time Calculation (min): 10 min Charges:  OT General Charges $OT Visit: 1 Visit OT Evaluation $OT Eval Low Complexity: 1 Low G-Codes:     Harris Penton A. Ulice Brilliant, M.S., OTR/L Pager: [place] 09/08/2017, 8:58 AM

## 2017-09-08 NOTE — Discharge Summary (Signed)
Physician Discharge Summary  Patient ID: [name] [name] MRN: [id number] DOB/AGE: 1950-12-30 66 y.o.  Admit date: 09/07/2017 Discharge date: 09/08/2017  Admission Diagnoses:  Lumbar pseudoarthrosis  Discharge Diagnoses:  Same Active Problems:   Pseudoarthrosis of lumbar spine  Discharged Condition: Stable  Hospital Course:  [name] [name] is a 66 y.o. male who was admitted for the below procedure. There were no post operative complications. At time of discharge, pain was well controlled, ambulating with Pt/OT, tolerating po, voiding normal. Ready for discharge.  Treatments: Surgery - Revision of fixation and arthrodesis L3-L5 with posterior lateral fixation and posterior lateral arthrodesis using allograft and infuse.  Discharge Exam: Blood pressure 131/80, pulse 90, temperature 98.7 F (37.1 C), temperature source Oral, resp. rate 18, height 6' 0.5" (1.842 m), weight 91.9 kg (202 lb 9.6 oz), SpO2 94 %. Awake, alert, oriented Speech fluent, appropriate CN grossly intact 5/5 BUE/BLE Wound c/d/i  Disposition: 06-Home-Health Care Svc  Discharge Instructions    Call MD for:  difficulty breathing, headache or visual disturbances   Complete by:  As directed    Call MD for:  persistant dizziness or light-headedness   Complete by:  As directed    Call MD for:  redness, tenderness, or signs of infection (pain, swelling, redness, odor or green/yellow discharge around incision site)   Complete by:  As directed    Call MD for:  severe uncontrolled pain   Complete by:  As directed    Call MD for:  temperature >100.4   Complete by:  As directed    Diet general   Complete by:  As directed    Driving Restrictions   Complete by:  As directed    Do not drive until given clearance.   Incentive spirometry RT   Complete by:  As directed    Increase activity slowly   Complete by:  As directed    Lifting restrictions   Complete by:  As directed    Do not lift anything >10lbs. Avoid  bending and twisting in awkward positions. Avoid bending at the back.   May shower / Bathe   Complete by:  As directed    In 24 hours. Okay to wash wound with warm soapy water. Avoid scrubbing the wound. Pat dry.   Remove dressing in 24 hours   Complete by:  As directed      Allergies as of 09/08/2017      Reactions   Statins Other (See Comments)   MYALGIAS      Medication List    STOP taking these medications   baclofen 20 MG tablet Commonly known as:  LIORESAL     TAKE these medications   aspirin 325 MG tablet Take 650-975 mg by mouth every 4 (four) hours as needed for mild pain.   diazepam 5 MG tablet Commonly known as:  VALIUM Take 1 tablet (5 mg total) by mouth every 6 (six) hours as needed for muscle spasms (IF ROBAXIN IS INEFFECTIVE).   EQL VITAMIN D3 2000 units Caps Generic drug:  Cholecalciferol Take 2,000 Units by mouth daily.   ibuprofen 200 MG tablet Commonly known as:  ADVIL,MOTRIN Take 600 mg by mouth every 6 (six) hours as needed for headache or mild pain.   methocarbamol 500 MG tablet Commonly known as:  ROBAXIN Take 1 tablet (500 mg total) by mouth every 6 (six) hours as needed for muscle spasms.   nicotine 14 mg/24hr patch Commonly known as:  NICODERM CQ - dosed in mg/24 hours  Place 14 mg onto the skin daily.   oxyCODONE 5 MG immediate release tablet Commonly known as:  ROXICODONE Take 1-2 tablets (5-10 mg total) by mouth every 4 (four) hours as needed for moderate pain or severe pain. What changed:    how much to take  how to take this  when to take this  reasons to take this  additional instructions      Follow-up Information    Barnett Abu, MD Follow up.   Specialty:  Neurosurgery Contact information: [address] [place] [address] [phone]           Signed: Alyson Ingles 09/08/2017, 8:00 AM

## 2017-09-08 NOTE — Progress Notes (Signed)
Patient's spouse called RN about patient being discharge home. Spouse was upset patient is being discharge this morning stated" He is in hospital less than 24 hours and he's being discharge and why is he going home so soon." RN explain to patient's spouse that patient has been ambulating as expected and taking pain medication with muscle relaxer. Patient met the criteria of spine surgery discharge order. Patient is ambulating up and down the hall, voiding adequately, eating without nausea and vomiting, denied any leg pain, no numbness or tingling to extremities and taking oral pain medication. RN called the on call MD and explain the situation and the order is still active for discharge. RN then called patient's spouse to explain the discharge is still active and more detail discharge instructions will be provided to them. RN informed AC about the situation and present in room with patient

## 2017-09-08 NOTE — Progress Notes (Signed)
Patient discharged home with spouse. Script and discharged instructions given to patient. Patient and spouse stated understanding of instructions given. Dressing supplies given to patient at discharge.  Patient and spouse apologize for any misunderstanding. Patient has an appointment with Dr. Ellene Route

## 2017-09-12 LAB — AEROBIC/ANAEROBIC CULTURE (SURGICAL/DEEP WOUND): Culture: NO GROWTH

## 2017-09-12 LAB — AEROBIC/ANAEROBIC CULTURE W GRAM STAIN (SURGICAL/DEEP WOUND)

## 2017-09-14 MED FILL — Sodium Chloride IV Soln 0.9%: INTRAVENOUS | Qty: 1000 | Status: AC

## 2017-09-14 MED FILL — Heparin Sodium (Porcine) Inj 1000 Unit/ML: INTRAMUSCULAR | Qty: 30 | Status: AC

## 2017-09-26 DIAGNOSIS — S32009K Unspecified fracture of unspecified lumbar vertebra, subsequent encounter for fracture with nonunion: Secondary | ICD-10-CM | POA: Diagnosis not present

## 2017-10-12 DIAGNOSIS — H5213 Myopia, bilateral: Secondary | ICD-10-CM | POA: Diagnosis not present

## 2017-10-12 DIAGNOSIS — H2513 Age-related nuclear cataract, bilateral: Secondary | ICD-10-CM | POA: Diagnosis not present

## 2017-10-25 DIAGNOSIS — S32009K Unspecified fracture of unspecified lumbar vertebra, subsequent encounter for fracture with nonunion: Secondary | ICD-10-CM | POA: Diagnosis not present

## 2017-11-12 DIAGNOSIS — M545 Low back pain: Secondary | ICD-10-CM | POA: Diagnosis not present

## 2017-11-12 DIAGNOSIS — S32009D Unspecified fracture of unspecified lumbar vertebra, subsequent encounter for fracture with routine healing: Secondary | ICD-10-CM | POA: Diagnosis not present

## 2017-11-15 DIAGNOSIS — M545 Low back pain: Secondary | ICD-10-CM | POA: Diagnosis not present

## 2017-11-15 DIAGNOSIS — S32009D Unspecified fracture of unspecified lumbar vertebra, subsequent encounter for fracture with routine healing: Secondary | ICD-10-CM | POA: Diagnosis not present

## 2017-11-26 DIAGNOSIS — S32009D Unspecified fracture of unspecified lumbar vertebra, subsequent encounter for fracture with routine healing: Secondary | ICD-10-CM | POA: Diagnosis not present

## 2017-11-26 DIAGNOSIS — M545 Low back pain: Secondary | ICD-10-CM | POA: Diagnosis not present

## 2017-11-29 DIAGNOSIS — M545 Low back pain: Secondary | ICD-10-CM | POA: Diagnosis not present

## 2017-11-29 DIAGNOSIS — S32009D Unspecified fracture of unspecified lumbar vertebra, subsequent encounter for fracture with routine healing: Secondary | ICD-10-CM | POA: Diagnosis not present

## 2017-12-05 DIAGNOSIS — M545 Low back pain: Secondary | ICD-10-CM | POA: Diagnosis not present

## 2017-12-05 DIAGNOSIS — S32009D Unspecified fracture of unspecified lumbar vertebra, subsequent encounter for fracture with routine healing: Secondary | ICD-10-CM | POA: Diagnosis not present

## 2017-12-07 DIAGNOSIS — S32009D Unspecified fracture of unspecified lumbar vertebra, subsequent encounter for fracture with routine healing: Secondary | ICD-10-CM | POA: Diagnosis not present

## 2017-12-07 DIAGNOSIS — M545 Low back pain: Secondary | ICD-10-CM | POA: Diagnosis not present

## 2017-12-11 DIAGNOSIS — S32009D Unspecified fracture of unspecified lumbar vertebra, subsequent encounter for fracture with routine healing: Secondary | ICD-10-CM | POA: Diagnosis not present

## 2017-12-11 DIAGNOSIS — M545 Low back pain: Secondary | ICD-10-CM | POA: Diagnosis not present

## 2017-12-13 DIAGNOSIS — S32009D Unspecified fracture of unspecified lumbar vertebra, subsequent encounter for fracture with routine healing: Secondary | ICD-10-CM | POA: Diagnosis not present

## 2017-12-13 DIAGNOSIS — M545 Low back pain: Secondary | ICD-10-CM | POA: Diagnosis not present

## 2017-12-18 DIAGNOSIS — M545 Low back pain: Secondary | ICD-10-CM | POA: Diagnosis not present

## 2017-12-18 DIAGNOSIS — S32009D Unspecified fracture of unspecified lumbar vertebra, subsequent encounter for fracture with routine healing: Secondary | ICD-10-CM | POA: Diagnosis not present

## 2017-12-20 DIAGNOSIS — M4316 Spondylolisthesis, lumbar region: Secondary | ICD-10-CM | POA: Diagnosis not present

## 2017-12-21 DIAGNOSIS — S32009D Unspecified fracture of unspecified lumbar vertebra, subsequent encounter for fracture with routine healing: Secondary | ICD-10-CM | POA: Diagnosis not present

## 2017-12-21 DIAGNOSIS — M545 Low back pain: Secondary | ICD-10-CM | POA: Diagnosis not present

## 2017-12-25 DIAGNOSIS — M545 Low back pain: Secondary | ICD-10-CM | POA: Diagnosis not present

## 2017-12-25 DIAGNOSIS — S32009D Unspecified fracture of unspecified lumbar vertebra, subsequent encounter for fracture with routine healing: Secondary | ICD-10-CM | POA: Diagnosis not present

## 2018-01-03 DIAGNOSIS — S32009D Unspecified fracture of unspecified lumbar vertebra, subsequent encounter for fracture with routine healing: Secondary | ICD-10-CM | POA: Diagnosis not present

## 2018-01-03 DIAGNOSIS — M545 Low back pain: Secondary | ICD-10-CM | POA: Diagnosis not present

## 2018-01-07 DIAGNOSIS — M545 Low back pain: Secondary | ICD-10-CM | POA: Diagnosis not present

## 2018-01-07 DIAGNOSIS — S32009D Unspecified fracture of unspecified lumbar vertebra, subsequent encounter for fracture with routine healing: Secondary | ICD-10-CM | POA: Diagnosis not present

## 2018-01-10 DIAGNOSIS — M545 Low back pain: Secondary | ICD-10-CM | POA: Diagnosis not present

## 2018-01-10 DIAGNOSIS — S32009D Unspecified fracture of unspecified lumbar vertebra, subsequent encounter for fracture with routine healing: Secondary | ICD-10-CM | POA: Diagnosis not present

## 2018-01-21 DIAGNOSIS — S32009D Unspecified fracture of unspecified lumbar vertebra, subsequent encounter for fracture with routine healing: Secondary | ICD-10-CM | POA: Diagnosis not present

## 2018-01-21 DIAGNOSIS — M545 Low back pain: Secondary | ICD-10-CM | POA: Diagnosis not present

## 2018-01-23 DIAGNOSIS — S32009D Unspecified fracture of unspecified lumbar vertebra, subsequent encounter for fracture with routine healing: Secondary | ICD-10-CM | POA: Diagnosis not present

## 2018-01-23 DIAGNOSIS — M545 Low back pain: Secondary | ICD-10-CM | POA: Diagnosis not present

## 2018-02-11 DIAGNOSIS — S32009D Unspecified fracture of unspecified lumbar vertebra, subsequent encounter for fracture with routine healing: Secondary | ICD-10-CM | POA: Diagnosis not present

## 2018-02-11 DIAGNOSIS — M545 Low back pain: Secondary | ICD-10-CM | POA: Diagnosis not present

## 2018-02-14 DIAGNOSIS — S32009D Unspecified fracture of unspecified lumbar vertebra, subsequent encounter for fracture with routine healing: Secondary | ICD-10-CM | POA: Diagnosis not present

## 2018-02-14 DIAGNOSIS — M545 Low back pain: Secondary | ICD-10-CM | POA: Diagnosis not present

## 2018-02-18 DIAGNOSIS — S32009D Unspecified fracture of unspecified lumbar vertebra, subsequent encounter for fracture with routine healing: Secondary | ICD-10-CM | POA: Diagnosis not present

## 2018-02-18 DIAGNOSIS — M545 Low back pain: Secondary | ICD-10-CM | POA: Diagnosis not present

## 2018-02-22 DIAGNOSIS — M545 Low back pain: Secondary | ICD-10-CM | POA: Diagnosis not present

## 2018-02-22 DIAGNOSIS — S32009D Unspecified fracture of unspecified lumbar vertebra, subsequent encounter for fracture with routine healing: Secondary | ICD-10-CM | POA: Diagnosis not present

## 2018-02-25 DIAGNOSIS — M545 Low back pain: Secondary | ICD-10-CM | POA: Diagnosis not present

## 2018-02-25 DIAGNOSIS — S32009D Unspecified fracture of unspecified lumbar vertebra, subsequent encounter for fracture with routine healing: Secondary | ICD-10-CM | POA: Diagnosis not present

## 2018-02-27 DIAGNOSIS — S32009D Unspecified fracture of unspecified lumbar vertebra, subsequent encounter for fracture with routine healing: Secondary | ICD-10-CM | POA: Diagnosis not present

## 2018-02-27 DIAGNOSIS — M545 Low back pain: Secondary | ICD-10-CM | POA: Diagnosis not present

## 2018-03-01 DIAGNOSIS — S32009D Unspecified fracture of unspecified lumbar vertebra, subsequent encounter for fracture with routine healing: Secondary | ICD-10-CM | POA: Diagnosis not present

## 2018-03-01 DIAGNOSIS — M545 Low back pain: Secondary | ICD-10-CM | POA: Diagnosis not present

## 2018-03-04 DIAGNOSIS — M545 Low back pain: Secondary | ICD-10-CM | POA: Diagnosis not present

## 2018-03-04 DIAGNOSIS — S32009D Unspecified fracture of unspecified lumbar vertebra, subsequent encounter for fracture with routine healing: Secondary | ICD-10-CM | POA: Diagnosis not present

## 2018-03-08 DIAGNOSIS — S32009D Unspecified fracture of unspecified lumbar vertebra, subsequent encounter for fracture with routine healing: Secondary | ICD-10-CM | POA: Diagnosis not present

## 2018-03-08 DIAGNOSIS — M545 Low back pain: Secondary | ICD-10-CM | POA: Diagnosis not present

## 2018-03-18 DIAGNOSIS — M545 Low back pain: Secondary | ICD-10-CM | POA: Diagnosis not present

## 2018-03-18 DIAGNOSIS — S32009D Unspecified fracture of unspecified lumbar vertebra, subsequent encounter for fracture with routine healing: Secondary | ICD-10-CM | POA: Diagnosis not present

## 2018-03-22 DIAGNOSIS — M545 Low back pain: Secondary | ICD-10-CM | POA: Diagnosis not present

## 2018-03-22 DIAGNOSIS — S32009D Unspecified fracture of unspecified lumbar vertebra, subsequent encounter for fracture with routine healing: Secondary | ICD-10-CM | POA: Diagnosis not present

## 2018-03-26 DIAGNOSIS — M545 Low back pain: Secondary | ICD-10-CM | POA: Diagnosis not present

## 2018-03-26 DIAGNOSIS — S32009D Unspecified fracture of unspecified lumbar vertebra, subsequent encounter for fracture with routine healing: Secondary | ICD-10-CM | POA: Diagnosis not present

## 2018-05-09 DIAGNOSIS — M542 Cervicalgia: Secondary | ICD-10-CM | POA: Diagnosis not present

## 2018-05-10 DIAGNOSIS — M545 Low back pain: Secondary | ICD-10-CM | POA: Diagnosis not present

## 2018-05-10 DIAGNOSIS — S32009D Unspecified fracture of unspecified lumbar vertebra, subsequent encounter for fracture with routine healing: Secondary | ICD-10-CM | POA: Diagnosis not present

## 2018-05-17 DIAGNOSIS — M545 Low back pain: Secondary | ICD-10-CM | POA: Diagnosis not present

## 2018-05-17 DIAGNOSIS — S32009D Unspecified fracture of unspecified lumbar vertebra, subsequent encounter for fracture with routine healing: Secondary | ICD-10-CM | POA: Diagnosis not present

## 2018-05-20 ENCOUNTER — Other Ambulatory Visit: Payer: Self-pay | Admitting: Neurological Surgery

## 2018-05-20 DIAGNOSIS — M47892 Other spondylosis, cervical region: Secondary | ICD-10-CM

## 2018-05-22 DIAGNOSIS — L918 Other hypertrophic disorders of the skin: Secondary | ICD-10-CM | POA: Diagnosis not present

## 2018-05-22 DIAGNOSIS — I868 Varicose veins of other specified sites: Secondary | ICD-10-CM | POA: Diagnosis not present

## 2018-05-22 DIAGNOSIS — L609 Nail disorder, unspecified: Secondary | ICD-10-CM | POA: Diagnosis not present

## 2018-05-22 DIAGNOSIS — M25572 Pain in left ankle and joints of left foot: Secondary | ICD-10-CM | POA: Diagnosis not present

## 2018-06-03 ENCOUNTER — Ambulatory Visit
Admission: RE | Admit: 2018-06-03 | Discharge: 2018-06-03 | Disposition: A | Discharge status: Home / Self Care | Payer: BLUE CROSS/BLUE SHIELD | Source: Ambulatory Visit | Attending: Neurological Surgery | Admitting: Neurological Surgery

## 2018-06-03 DIAGNOSIS — S32009D Unspecified fracture of unspecified lumbar vertebra, subsequent encounter for fracture with routine healing: Secondary | ICD-10-CM | POA: Diagnosis not present

## 2018-06-03 DIAGNOSIS — M545 Low back pain: Secondary | ICD-10-CM | POA: Diagnosis not present

## 2018-06-03 DIAGNOSIS — M4802 Spinal stenosis, cervical region: Secondary | ICD-10-CM | POA: Diagnosis not present

## 2018-06-03 DIAGNOSIS — M47892 Other spondylosis, cervical region: Secondary | ICD-10-CM

## 2018-06-05 DIAGNOSIS — Z713 Dietary counseling and surveillance: Secondary | ICD-10-CM | POA: Diagnosis not present

## 2018-06-06 DIAGNOSIS — M4722 Other spondylosis with radiculopathy, cervical region: Secondary | ICD-10-CM | POA: Diagnosis not present

## 2018-06-06 DIAGNOSIS — R030 Elevated blood-pressure reading, without diagnosis of hypertension: Secondary | ICD-10-CM | POA: Diagnosis not present

## 2018-06-06 DIAGNOSIS — Z6829 Body mass index (BMI) 29.0-29.9, adult: Secondary | ICD-10-CM | POA: Diagnosis not present

## 2018-06-13 DIAGNOSIS — J069 Acute upper respiratory infection, unspecified: Secondary | ICD-10-CM | POA: Diagnosis not present

## 2018-06-13 DIAGNOSIS — J029 Acute pharyngitis, unspecified: Secondary | ICD-10-CM | POA: Diagnosis not present

## 2018-07-02 IMAGING — RF DG LUMBAR SPINE 2-3V
1 series · 2 of 2 positions shown · non-contrast
Comparison: CT lumbar myelography 07/12/2017, lumbar spine
radiographs 05/10/2017

FLUOROSCOPY TIME:  0 minutes 6 seconds

CLINICAL DATA: L3-L5 posterior lumbar interbody fusion

EXAM:
LUMBAR SPINE - 2-3 VIEW; DG C-ARM 61-120 MIN

[Series 1: run · 2 of 2 slices shown]
[im 1/2]
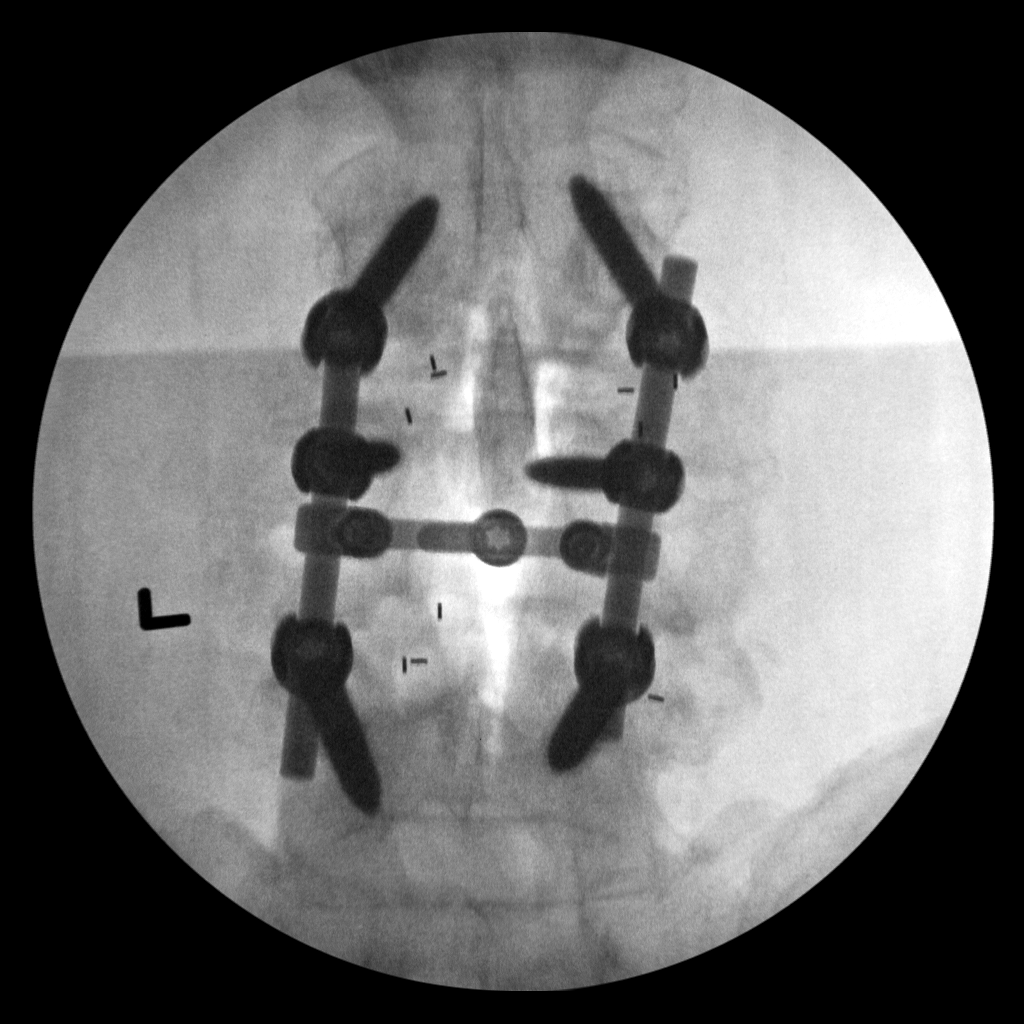
[im 2/2]
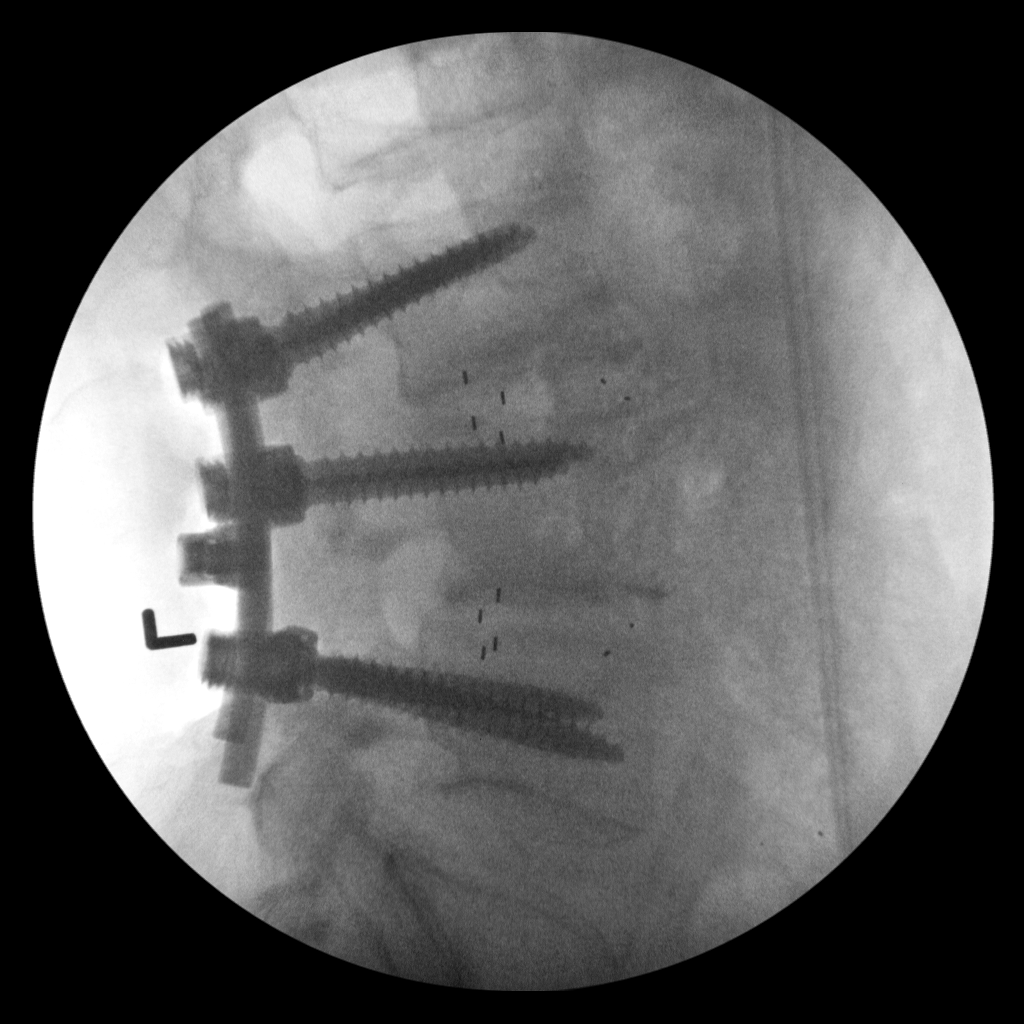

[2 of 2 positions shown; findings below may reference images not displayed]

FINDINGS: Prior CT labeled with a partially lumbarized S1 segment.

S1 and sacrum are not definitely visualized on submitted images to
establish definitive segment levels.

Submitted images demonstrate BILATERAL pedicle screws and posterior
bars at what is presumed to be L3-L5.

New disc prostheses at L3-L4 and L4-L5 disc spaces.

Bones demineralized.
IMPRESSION: Posterior lumbar fusion of presumed L3-L5 as above.

## 2018-08-13 DIAGNOSIS — M25511 Pain in right shoulder: Secondary | ICD-10-CM | POA: Diagnosis not present

## 2018-09-24 DIAGNOSIS — H903 Sensorineural hearing loss, bilateral: Secondary | ICD-10-CM | POA: Diagnosis not present

## 2018-10-21 DIAGNOSIS — H2513 Age-related nuclear cataract, bilateral: Secondary | ICD-10-CM | POA: Diagnosis not present

## 2018-10-21 DIAGNOSIS — H5213 Myopia, bilateral: Secondary | ICD-10-CM | POA: Diagnosis not present

## 2018-10-21 DIAGNOSIS — Z23 Encounter for immunization: Secondary | ICD-10-CM | POA: Diagnosis not present

## 2018-10-21 DIAGNOSIS — Z0000 Encounter for general adult medical examination without abnormal findings: Secondary | ICD-10-CM | POA: Diagnosis not present

## 2018-10-21 DIAGNOSIS — E7800 Pure hypercholesterolemia, unspecified: Secondary | ICD-10-CM | POA: Diagnosis not present

## 2018-10-21 DIAGNOSIS — R252 Cramp and spasm: Secondary | ICD-10-CM | POA: Diagnosis not present

## 2018-10-21 DIAGNOSIS — Z125 Encounter for screening for malignant neoplasm of prostate: Secondary | ICD-10-CM | POA: Diagnosis not present

## 2018-10-22 DIAGNOSIS — H903 Sensorineural hearing loss, bilateral: Secondary | ICD-10-CM | POA: Diagnosis not present

## 2018-10-31 DIAGNOSIS — M65351 Trigger finger, right little finger: Secondary | ICD-10-CM | POA: Diagnosis not present

## 2018-11-08 DIAGNOSIS — M65351 Trigger finger, right little finger: Secondary | ICD-10-CM | POA: Diagnosis not present

## 2018-11-12 DIAGNOSIS — M25512 Pain in left shoulder: Secondary | ICD-10-CM | POA: Diagnosis not present

## 2018-11-12 DIAGNOSIS — M25511 Pain in right shoulder: Secondary | ICD-10-CM | POA: Diagnosis not present

## 2018-11-12 DIAGNOSIS — Z9889 Other specified postprocedural states: Secondary | ICD-10-CM | POA: Diagnosis not present

## 2018-11-12 DIAGNOSIS — M19011 Primary osteoarthritis, right shoulder: Secondary | ICD-10-CM | POA: Diagnosis not present

## 2019-02-24 DIAGNOSIS — K635 Polyp of colon: Secondary | ICD-10-CM | POA: Diagnosis not present

## 2019-02-24 DIAGNOSIS — K5730 Diverticulosis of large intestine without perforation or abscess without bleeding: Secondary | ICD-10-CM | POA: Diagnosis not present

## 2019-02-24 DIAGNOSIS — D126 Benign neoplasm of colon, unspecified: Secondary | ICD-10-CM | POA: Diagnosis not present

## 2019-02-24 DIAGNOSIS — Z86010 Personal history of colonic polyps: Secondary | ICD-10-CM | POA: Diagnosis not present

## 2019-03-05 DIAGNOSIS — Z8619 Personal history of other infectious and parasitic diseases: Secondary | ICD-10-CM | POA: Diagnosis not present

## 2019-03-05 DIAGNOSIS — R079 Chest pain, unspecified: Secondary | ICD-10-CM | POA: Diagnosis not present

## 2019-03-28 DIAGNOSIS — B1920 Unspecified viral hepatitis C without hepatic coma: Secondary | ICD-10-CM | POA: Diagnosis not present

## 2019-04-03 DIAGNOSIS — M25512 Pain in left shoulder: Secondary | ICD-10-CM | POA: Diagnosis not present

## 2019-04-03 DIAGNOSIS — M19011 Primary osteoarthritis, right shoulder: Secondary | ICD-10-CM | POA: Diagnosis not present

## 2019-04-03 DIAGNOSIS — M25511 Pain in right shoulder: Secondary | ICD-10-CM | POA: Diagnosis not present

## 2019-04-11 DIAGNOSIS — M25511 Pain in right shoulder: Secondary | ICD-10-CM | POA: Diagnosis not present

## 2019-04-17 DIAGNOSIS — M19011 Primary osteoarthritis, right shoulder: Secondary | ICD-10-CM | POA: Diagnosis not present

## 2019-04-17 DIAGNOSIS — M25511 Pain in right shoulder: Secondary | ICD-10-CM | POA: Diagnosis not present

## 2019-04-17 DIAGNOSIS — M25512 Pain in left shoulder: Secondary | ICD-10-CM | POA: Diagnosis not present

## 2019-04-22 DIAGNOSIS — R768 Other specified abnormal immunological findings in serum: Secondary | ICD-10-CM | POA: Diagnosis not present

## 2019-04-26 DIAGNOSIS — M25512 Pain in left shoulder: Secondary | ICD-10-CM | POA: Diagnosis not present

## 2019-05-01 DIAGNOSIS — M25512 Pain in left shoulder: Secondary | ICD-10-CM | POA: Diagnosis not present

## 2019-05-14 ENCOUNTER — Other Ambulatory Visit: Payer: Self-pay | Admitting: *Deleted

## 2019-05-14 DIAGNOSIS — Z20822 Contact with and (suspected) exposure to covid-19: Secondary | ICD-10-CM

## 2019-05-14 DIAGNOSIS — R42 Dizziness and giddiness: Secondary | ICD-10-CM | POA: Diagnosis not present

## 2019-05-14 DIAGNOSIS — R6889 Other general symptoms and signs: Secondary | ICD-10-CM | POA: Diagnosis not present

## 2019-05-14 DIAGNOSIS — R5383 Other fatigue: Secondary | ICD-10-CM | POA: Diagnosis not present

## 2019-05-15 LAB — NOVEL CORONAVIRUS, NAA: SARS-CoV-2, NAA: NOT DETECTED

## 2019-06-30 ENCOUNTER — Other Ambulatory Visit: Payer: Self-pay

## 2019-06-30 DIAGNOSIS — Z20822 Contact with and (suspected) exposure to covid-19: Secondary | ICD-10-CM

## 2019-07-02 LAB — NOVEL CORONAVIRUS, NAA: SARS-CoV-2, NAA: NOT DETECTED

## 2019-09-03 ENCOUNTER — Ambulatory Visit: Payer: BC Managed Care – PPO | Attending: Internal Medicine

## 2019-09-03 DIAGNOSIS — Z20828 Contact with and (suspected) exposure to other viral communicable diseases: Secondary | ICD-10-CM | POA: Diagnosis not present

## 2019-09-03 DIAGNOSIS — Z20822 Contact with and (suspected) exposure to covid-19: Secondary | ICD-10-CM

## 2019-09-04 LAB — NOVEL CORONAVIRUS, NAA: SARS-CoV-2, NAA: NOT DETECTED

## 2019-09-30 DIAGNOSIS — M19012 Primary osteoarthritis, left shoulder: Secondary | ICD-10-CM | POA: Diagnosis not present

## 2019-09-30 DIAGNOSIS — M25511 Pain in right shoulder: Secondary | ICD-10-CM | POA: Diagnosis not present

## 2019-10-09 NOTE — H&P (Signed)
Patient's anticipated LOS is less than 2 midnights, meeting these requirements: - Younger than 55 - Lives within 1 hour of care - Has a competent adult at home to recover with post-op recover - NO history of  - Chronic pain requiring opiods  - Diabetes  - Coronary Artery Disease  - Heart failure  - Heart attack  - Stroke  - DVT/VTE  - Cardiac arrhythmia  - Respiratory Failure/COPD  - Renal failure  - Anemia  - Advanced Liver disease       [name] [name] is an 70 y.o. male.    Chief Complaint: right shoulder pain  HPI: Pt is a 69 y.o. male complaining of right shoulder pain for multiple months. Pain had continually increased since the beginning. X-rays in the clinic show end-stage arthritic changes of the right shoulder. Pt has tried various conservative treatments which have failed to alleviate their symptoms, including injections and therapy. Various options are discussed with the patient. Risks, benefits and expectations were discussed with the patient. Patient understand the risks, benefits and expectations and wishes to proceed with surgery.   PCP:  Gaynelle Arabian, MD  D/C Plans: Home  PMH: Past Medical History:  Diagnosis Date  . Arthritis   . Cataracts, bilateral    very mild  . Headache 2011   HX OF VISUAL MIGRAINES - NO PAIN --MIGRAINES ATTRIBUTED TO PT CUTTING NICOTINE PATCH IN HALF BEFORE APPLYING TO HIS SKIN.    Marland Kitchen Hepatitis 2001   C -treated and no problems since  2001 'cured'  . Osteopenia   . Pain    KNEE OA  . Shortness of breath dyspnea    "SOMETIMES FEELS LIKE I CAN'T CATCH A DEEP BREATH "- ON GOING FOR YEARS - [place] WORK UP 2011 - [facility]  . Stroke ([place])    HX POSSIBLE TIA - RT LEG NUMBNESS AND TINGLING FINGERS AND NUMBNESS RT FACE - ALL RESOLVED ; STATES NEUROLOGIST COULD NOT FIND ANY EVIDENCE OF STROKE - THOUGHT PT'S EPISODE MIGHT BE FROM CUTTING NICOTINE PATCH IN HALF AND APPLYING TO SKIN  .  Trigger finger    LEFT MIDDLE FINGER    PSH: Past Surgical History:  Procedure Laterality Date  . JOINT REPLACEMENT    . left ankle surgery     fractured  . left knee surgery     MENISCAL REMOVED  . left shoulder     torn ligaments left shoulder  . RIGHT CARPAL TUNNEL RELEASE  2011  . RIGHT KNEE ARTHROSCOPY   AUG OR SEPT 2015   AT [facility]  . ROOT CANAL    . TONSILLECTOMY     as child  . 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 TOTAL KNEE ARTHROPLASTY Right 07/21/2014   Procedure: RIGHT TOTAL KNEE ARTHROPLASTY;  Surgeon: Mauri Pole, MD;  Location: [facility];  Service: Orthopedics;  Laterality: Right;  . TRIGGER FINGER RELEASE Left     Social History:  reports that he quit smoking about 7 years ago. He has never used smokeless tobacco. He reports current alcohol use of about 7.0 standard drinks of alcohol per week. He reports current drug use. Drug: Marijuana.  Allergies:  Allergies  Allergen Reactions  . Statins Other (See Comments)    MYALGIAS    Medications: No current facility-administered medications  for this encounter.   Current Outpatient Medications  Medication Sig Dispense Refill  . HYDROcodone-acetaminophen (NORCO/VICODIN) 5-325 MG tablet Take 1 tablet by mouth every 6 (six) hours as needed for moderate pain.    . Magnesium 500 MG TABS Take 500 mg by mouth 2 (two) times daily.    Marland Kitchen ibuprofen (ADVIL,MOTRIN) 200 MG tablet Take 400-600 mg by mouth every 4 (four) hours as needed for headache or mild pain (for pain.).       No results found for this or any previous visit (from the past 48 hour(s)). No results found.  ROS: Pain with rom of the right upper extremity  Physical Exam: Alert and oriented 69 y.o. male in no acute distress Cranial nerves 2-12 intact Cervical spine: full rom with no tenderness, nv  intact distally Chest: active breath sounds bilaterally, no wheeze rhonchi or rales Heart: regular rate and rhythm, no murmur Abd: non tender non distended with active bowel sounds Hip is stable with rom  Very painful and weak rom nv intact distally No rashes or edema distally  Assessment/Plan Assessment: right shoulder end stage osteoarthritis  Plan:  Patient will undergo a right total shoulder by Dr. Veverly Fells at [facility]. Risks benefits and expectations were discussed with the patient. Patient understand risks, benefits and expectations and wishes to proceed. Preoperative templating of the joint replacement has been completed, documented, and submitted to the Operating Room personnel in order to optimize intra-operative equipment management.   Merla Riches PA-C, [facility] is now [employer] [address], [place], [place], [place] [address] Phone: [phone] [email or web address] Facebook  [employer]

## 2019-10-10 NOTE — Patient Instructions (Addendum)
DUE TO COVID-19 ONLY ONE VISITOR IS ALLOWED TO COME WITH YOU AND STAY IN THE WAITING ROOM ONLY DURING PRE OP AND PROCEDURE. THE ONE VISITOR MAY VISIT WITH YOU IN YOUR PRIVATE ROOM DURING VISITING HOURS ONLY!!   COVID SWAB TESTING MUST BE COMPLETED ON:  Tuesday, Feb. 2, 2021 at 1:15 PM    [address], [place] -[facility] enter pre surgical testing line (Must self quarantine after testing. Follow instructions on handout.)             Your procedure is scheduled on: Friday, Feb. 5, 2021   Report to [facility] Main  Entrance    Report to admitting at 7:45 AM   Call this number if you have problems the morning of surgery [phone]   Do not eat food :After Midnight.   May have liquids until 7:15 AM day of surgery   CLEAR LIQUID DIET  Foods Allowed                                                                     Foods Excluded  Water, Black Coffee and tea, regular and decaf                             liquids that you cannot  Plain Jell-O in any flavor  (No red)                                           see through such as: Fruit ices (not with fruit pulp)                                     milk, soups, orange juice  Iced Popsicles (No red)                                    All solid food Carbonated beverages, regular and diet                                    Apple juices Sports drinks like Gatorade (No red) Lightly seasoned clear broth or consume(fat free) Sugar, honey syrup  Sample Menu Breakfast                                Lunch                                     Supper Cranberry juice                    Beef broth                            Chicken  broth Jell-O                                     Grape juice                           Apple juice Coffee or tea                        Jell-O                                      Popsicle                                                Coffee or tea                        Coffee or  tea   Complete one Ensure drink the morning of surgery at  7:15 AM the day of surgery.   Brush your teeth the morning of surgery.   Do NOT smoke after Midnight   Take these medicines the morning of surgery with A SIP OF WATER: None                               You may not have any metal on your body including jewelry, and body piercings             Do not wear lotions, powders, perfumes/cologne, or deodorant                           Men may shave face and neck.   Do not bring valuables to the hospital. [place].   Contacts, dentures or bridgework may not be worn into surgery.    Patients discharged the day of surgery will not be allowed to drive home.   Special Instructions: Bring a copy of your healthcare power of attorney and living will documents         the day of surgery if you haven't scanned them in before.              Please read over the following fact sheets you were given:  [facility]- Preparing for Total Shoulder Arthroplasty    Before surgery, you can play an important role. Because skin is not sterile, your skin needs to be as free of germs as possible. You can reduce the number of germs on your skin by using the following products. . Benzoyl Peroxide Gel o Reduces the number of germs present on the skin o Applied twice a day to shoulder area starting two days before surgery    ==================================================================  Please follow these instructions carefully:  BENZOYL PEROXIDE 5% GEL  Please do not use if you have an allergy to benzoyl peroxide.   If your skin becomes reddened/irritated stop using the benzoyl peroxide.  Starting two days before surgery, apply as follows: 1. Apply  benzoyl peroxide in the morning and at night. Apply after taking a shower. If you are not taking a shower clean entire shoulder front, back, and side along with the armpit with a clean wet  washcloth.  2. Place a quarter-sized dollop on your shoulder and rub in thoroughly, making sure to cover the front, back, and side of your shoulder, along with the armpit.   2 days before ____ AM   ____ PM              1 day before ____ AM   ____ PM                         3. Do this twice a day for two days.  (Last application is the night before surgery, AFTER using the CHG soap as described below).  4. Do NOT apply benzoyl peroxide gel on the day of surgery.   [facility] - Preparing for Surgery Before surgery, you can play an important role.  Because skin is not sterile, your skin needs to be as free of germs as possible.  You can reduce the number of germs on your skin by washing with CHG (chlorahexidine gluconate) soap before surgery.  CHG is an antiseptic cleaner which kills germs and bonds with the skin to continue killing germs even after washing. Please DO NOT use if you have an allergy to CHG or antibacterial soaps.  If your skin becomes reddened/irritated stop using the CHG and inform your nurse when you arrive at Short Stay. Do not shave (including legs and underarms) for at least 48 hours prior to the first CHG shower.  You may shave your face/neck.  Please follow these instructions carefully:  1.  Shower with CHG Soap the night before surgery and the  morning of surgery.  2.  If you choose to wash your hair, wash your hair first as usual with your normal  shampoo.  3.  After you shampoo, rinse your hair and body thoroughly to remove the shampoo.                             4.  Use CHG as you would any other liquid soap.  You can apply chg directly to the skin and wash.  Gently with a scrungie or clean washcloth.  5.  Apply the CHG Soap to your body ONLY FROM THE NECK DOWN.   Do   not use on face/ open                           Wound or open sores. Avoid contact with eyes, ears mouth and   genitals (private parts).                       Wash face,  Genitals (private parts) with  your normal soap.             6.  Wash thoroughly, paying special attention to the area where your    surgery  will be performed.  7.  Thoroughly rinse your body with warm water from the neck down.  8.  DO NOT shower/wash with your normal soap after using and rinsing off the CHG Soap.                9.  Pat yourself dry with a clean towel.  10.  Wear clean pajamas.            11.  Place clean sheets on your bed the night of your first shower and do not  sleep with pets. Day of Surgery : Do not apply any lotions/deodorants the morning of surgery.  Please wear clean clothes to the hospital/surgery center.  FAILURE TO FOLLOW THESE INSTRUCTIONS MAY RESULT IN THE CANCELLATION OF YOUR SURGERY  PATIENT SIGNATURE_________________________________  NURSE SIGNATURE__________________________________  ________________________________________________________________________   Adam Phenix  An incentive spirometer is a tool that can help keep your lungs clear and active. This tool measures how well you are filling your lungs with each breath. Taking long deep breaths may help reverse or decrease the chance of developing breathing (pulmonary) problems (especially infection) following:  A long period of time when you are unable to move or be active. BEFORE THE PROCEDURE   If the spirometer includes an indicator to show your best effort, your nurse or respiratory therapist will set it to a desired goal.  If possible, sit up straight or lean slightly forward. Try not to slouch.  Hold the incentive spirometer in an upright position. INSTRUCTIONS FOR USE  1. Sit on the edge of your bed if possible, or sit up as far as you can in bed or on a chair. 2. Hold the incentive spirometer in an upright position. 3. Breathe out normally. 4. Place the mouthpiece in your mouth and seal your lips tightly around it. 5. Breathe in slowly and as deeply as possible, raising the piston or the ball  toward the top of the column. 6. Hold your breath for 3-5 seconds or for as long as possible. Allow the piston or ball to fall to the bottom of the column. 7. Remove the mouthpiece from your mouth and breathe out normally. 8. Rest for a few seconds and repeat Steps 1 through 7 at least 10 times every 1-2 hours when you are awake. Take your time and take a few normal breaths between deep breaths. 9. The spirometer may include an indicator to show your best effort. Use the indicator as a goal to work toward during each repetition. 10. After each set of 10 deep breaths, practice coughing to be sure your lungs are clear. If you have an incision (the cut made at the time of surgery), support your incision when coughing by placing a pillow or rolled up towels firmly against it. Once you are able to get out of bed, walk around indoors and cough well. You may stop using the incentive spirometer when instructed by your caregiver.  RISKS AND COMPLICATIONS  Take your time so you do not get dizzy or light-headed.  If you are in pain, you may need to take or ask for pain medication before doing incentive spirometry. It is harder to take a deep breath if you are having pain. AFTER USE  Rest and breathe slowly and easily.  It can be helpful to keep track of a log of your progress. Your caregiver can provide you with a simple table to help with this. If you are using the spirometer at home, follow these instructions: [place] IF:   You are having difficultly using the spirometer.  You have trouble using the spirometer as often as instructed.  Your pain medication is not giving enough relief while using the spirometer.  You develop fever of 100.5 F (38.1 C) or higher. SEEK IMMEDIATE MEDICAL CARE IF:   You cough up bloody  sputum that had not been present before.  You develop fever of 102 F (38.9 C) or greater.  You develop worsening pain at or near the incision site. MAKE SURE YOU:    Understand these instructions.  Will watch your condition.  Will get help right away if you are not doing well or get worse. Document Released: 01/08/2007 Document Revised: 11/20/2011 Document Reviewed: 03/11/2007 [facility] Patient Information 2014 [place], [place].   ________________________________________________________________________

## 2019-10-12 ENCOUNTER — Ambulatory Visit: Payer: BC Managed Care – PPO

## 2019-10-14 ENCOUNTER — Encounter (HOSPITAL_COMMUNITY): Payer: Self-pay

## 2019-10-14 ENCOUNTER — Other Ambulatory Visit (HOSPITAL_COMMUNITY): Payer: BC Managed Care – PPO

## 2019-10-14 ENCOUNTER — Encounter (HOSPITAL_COMMUNITY)
Admission: RE | Admit: 2019-10-14 | Discharge: 2019-10-14 | Disposition: A | Discharge status: Home / Self Care | Payer: Medicare Other | Source: Ambulatory Visit | Attending: Orthopedic Surgery | Admitting: Orthopedic Surgery

## 2019-10-14 ENCOUNTER — Other Ambulatory Visit (HOSPITAL_COMMUNITY)
Admission: RE | Admit: 2019-10-14 | Discharge: 2019-10-14 | Disposition: A | Discharge status: Home / Self Care | Payer: Medicare Other | Source: Ambulatory Visit | Attending: Orthopedic Surgery | Admitting: Orthopedic Surgery

## 2019-10-14 ENCOUNTER — Other Ambulatory Visit: Payer: Self-pay

## 2019-10-14 DIAGNOSIS — Z01812 Encounter for preprocedural laboratory examination: Secondary | ICD-10-CM | POA: Diagnosis not present

## 2019-10-14 DIAGNOSIS — Z20822 Contact with and (suspected) exposure to covid-19: Secondary | ICD-10-CM | POA: Insufficient documentation

## 2019-10-14 HISTORY — DX: Carpal tunnel syndrome, unspecified upper limb: G56.00

## 2019-10-14 HISTORY — DX: Cramp and spasm: R25.2

## 2019-10-14 LAB — CBC
HCT: 46.2 % (ref 39.0–52.0)
Hemoglobin: 15.5 g/dL (ref 13.0–17.0)
MCH: 32.9 pg (ref 26.0–34.0)
MCHC: 33.5 g/dL (ref 30.0–36.0)
MCV: 98.1 fL (ref 80.0–100.0)
Platelets: 188 10*3/uL (ref 150–400)
RBC: 4.71 MIL/uL (ref 4.22–5.81)
RDW: 12.1 % (ref 11.5–15.5)
WBC: 7.4 10*3/uL (ref 4.0–10.5)
nRBC: 0 % (ref 0.0–0.2)

## 2019-10-14 LAB — COMPREHENSIVE METABOLIC PANEL
ALT: 47 U/L — ABNORMAL HIGH (ref 0–44)
AST: 36 U/L (ref 15–41)
Albumin: 4.5 g/dL (ref 3.5–5.0)
Alkaline Phosphatase: 58 U/L (ref 38–126)
Anion gap: 10 (ref 5–15)
BUN: 25 mg/dL — ABNORMAL HIGH (ref 8–23)
CO2: 24 mmol/L (ref 22–32)
Calcium: 9.4 mg/dL (ref 8.9–10.3)
Chloride: 101 mmol/L (ref 98–111)
Creatinine, Ser: 0.89 mg/dL (ref 0.61–1.24)
GFR calc Af Amer: >60 mL/min (ref >60)
GFR calc non Af Amer: >60 mL/min (ref >60)
Glucose, Bld: 142 mg/dL — ABNORMAL HIGH (ref 70–99)
Potassium: 4.1 mmol/L (ref 3.5–5.1)
Sodium: 135 mmol/L (ref 135–145)
Total Bilirubin: 1.3 mg/dL — ABNORMAL HIGH (ref 0.3–1.2)
Total Protein: 7.5 g/dL (ref 6.5–8.1)

## 2019-10-14 LAB — SARS CORONAVIRUS 2 (TAT 6-24 HRS): SARS Coronavirus 2: NEGATIVE

## 2019-10-14 LAB — SURGICAL PCR SCREEN
MRSA, PCR: NEGATIVE
Staphylococcus aureus: POSITIVE — AB

## 2019-10-14 NOTE — Progress Notes (Signed)
PCP - Dr. Oneida Arenas Cardiologist - N/A  Chest x-ray -  N/A EKG - greater than 1 year Stress Test - greater than 2 year ECHO - greater than 2 year Cardiac Cath - N/A  Sleep Study - Yes CPAP - NO  Fasting Blood Sugar - N/A Checks Blood Sugar __N/A___ times a day  Blood Thinner Instructions:  N/A Aspirin Instructions: N/A Last Dose: N/A  Anesthesia review: TIA  Patient denies shortness of breath, fever, cough and chest pain at PAT appointment   Patient verbalized understanding of instructions that were given to them at the PAT appointment. Patient was also instructed that they will need to review over the PAT instructions again at home before surgery.

## 2019-10-15 NOTE — Progress Notes (Signed)
PCR results routed to Dr. Veverly Fells for review

## 2019-10-17 ENCOUNTER — Other Ambulatory Visit: Payer: Self-pay

## 2019-10-17 ENCOUNTER — Ambulatory Visit (HOSPITAL_COMMUNITY): Payer: Medicare Other | Admitting: Physician Assistant

## 2019-10-17 ENCOUNTER — Observation Stay (HOSPITAL_COMMUNITY): Payer: Medicare Other

## 2019-10-17 ENCOUNTER — Ambulatory Visit (HOSPITAL_COMMUNITY): Payer: Medicare Other | Admitting: Certified Registered"

## 2019-10-17 ENCOUNTER — Ambulatory Visit: Payer: BC Managed Care – PPO

## 2019-10-17 ENCOUNTER — Encounter (HOSPITAL_COMMUNITY)
Admission: AD | Disposition: A | Discharge status: Home / Self Care | Payer: Self-pay | Source: Home / Self Care | Attending: Orthopedic Surgery

## 2019-10-17 ENCOUNTER — Encounter (HOSPITAL_COMMUNITY): Payer: Self-pay | Admitting: Orthopedic Surgery

## 2019-10-17 ENCOUNTER — Observation Stay (HOSPITAL_COMMUNITY)
Admission: AD | Admit: 2019-10-17 | Discharge: 2019-10-18 | Disposition: A | Discharge status: Home / Self Care | Payer: Medicare Other | Attending: Orthopedic Surgery | Admitting: Orthopedic Surgery

## 2019-10-17 DIAGNOSIS — G8918 Other acute postprocedural pain: Secondary | ICD-10-CM | POA: Diagnosis not present

## 2019-10-17 DIAGNOSIS — G459 Transient cerebral ischemic attack, unspecified: Secondary | ICD-10-CM | POA: Diagnosis not present

## 2019-10-17 DIAGNOSIS — M19011 Primary osteoarthritis, right shoulder: Principal | ICD-10-CM | POA: Insufficient documentation

## 2019-10-17 DIAGNOSIS — M8580 Other specified disorders of bone density and structure, unspecified site: Secondary | ICD-10-CM | POA: Insufficient documentation

## 2019-10-17 DIAGNOSIS — Z96611 Presence of right artificial shoulder joint: Secondary | ICD-10-CM

## 2019-10-17 DIAGNOSIS — Z96653 Presence of artificial knee joint, bilateral: Secondary | ICD-10-CM | POA: Diagnosis not present

## 2019-10-17 DIAGNOSIS — Z471 Aftercare following joint replacement surgery: Secondary | ICD-10-CM | POA: Diagnosis not present

## 2019-10-17 DIAGNOSIS — M67813 Other specified disorders of tendon, right shoulder: Secondary | ICD-10-CM | POA: Diagnosis not present

## 2019-10-17 DIAGNOSIS — Z87891 Personal history of nicotine dependence: Secondary | ICD-10-CM | POA: Insufficient documentation

## 2019-10-17 DIAGNOSIS — M75111 Incomplete rotator cuff tear or rupture of right shoulder, not specified as traumatic: Secondary | ICD-10-CM | POA: Diagnosis not present

## 2019-10-17 DIAGNOSIS — Z888 Allergy status to other drugs, medicaments and biological substances status: Secondary | ICD-10-CM | POA: Diagnosis not present

## 2019-10-17 HISTORY — PX: TOTAL SHOULDER ARTHROPLASTY: SHX126

## 2019-10-17 SURGERY — ARTHROPLASTY, SHOULDER, TOTAL
Anesthesia: General | Site: Shoulder | Laterality: Right

## 2019-10-17 MED ORDER — HYDROMORPHONE HCL 1 MG/ML IJ SOLN
INTRAMUSCULAR | Status: AC
  Filled 2019-10-17: qty 2

## 2019-10-17 MED ORDER — HYDROMORPHONE HCL 1 MG/ML IJ SOLN: 0.5000 mg | INTRAMUSCULAR | Status: DC | PRN

## 2019-10-17 MED ORDER — POLYETHYLENE GLYCOL 3350 17 G PO PACK: 17.0000 g | PACK | Freq: Every day | ORAL | Status: DC | PRN

## 2019-10-17 MED ORDER — LIDOCAINE 2% (20 MG/ML) 5 ML SYRINGE
INTRAMUSCULAR | Status: AC
  Filled 2019-10-17: qty 5

## 2019-10-17 MED ORDER — FENTANYL CITRATE (PF) 250 MCG/5ML IJ SOLN
INTRAMUSCULAR | Status: DC | PRN
  Administered 2019-10-17: 25 ug via INTRAVENOUS
  Administered 2019-10-17 (×2): 50 ug via INTRAVENOUS

## 2019-10-17 MED ORDER — CHLORHEXIDINE GLUCONATE 4 % EX LIQD: 60.0000 mL | Freq: Once | CUTANEOUS | Status: DC

## 2019-10-17 MED ORDER — PHENYLEPHRINE 40 MCG/ML (10ML) SYRINGE FOR IV PUSH (FOR BLOOD PRESSURE SUPPORT)
PREFILLED_SYRINGE | INTRAVENOUS | Status: DC | PRN
  Administered 2019-10-17: 40 ug via INTRAVENOUS
  Administered 2019-10-17: 80 ug via INTRAVENOUS

## 2019-10-17 MED ORDER — LACTATED RINGERS IV SOLN: INTRAVENOUS | Status: DC

## 2019-10-17 MED ORDER — METHOCARBAMOL 500 MG IVPB - SIMPLE MED
500.0000 mg | Freq: Four times a day (QID) | INTRAVENOUS | Status: DC | PRN
  Administered 2019-10-17: 500 mg via INTRAVENOUS
  Filled 2019-10-17: qty 50

## 2019-10-17 MED ORDER — FENTANYL CITRATE (PF) 100 MCG/2ML IJ SOLN
INTRAMUSCULAR | Status: AC
  Filled 2019-10-17: qty 2

## 2019-10-17 MED ORDER — SUCCINYLCHOLINE CHLORIDE 200 MG/10ML IV SOSY
PREFILLED_SYRINGE | INTRAVENOUS | Status: AC
  Filled 2019-10-17: qty 10

## 2019-10-17 MED ORDER — ONDANSETRON HCL 4 MG/2ML IJ SOLN
INTRAMUSCULAR | Status: AC
  Filled 2019-10-17: qty 2

## 2019-10-17 MED ORDER — ONDANSETRON HCL 4 MG/2ML IJ SOLN
INTRAMUSCULAR | Status: DC | PRN
  Administered 2019-10-17: 4 mg via INTRAVENOUS

## 2019-10-17 MED ORDER — PHENOL 1.4 % MT LIQD
1.0000 | OROMUCOSAL | Status: DC | PRN
  Filled 2019-10-17: qty 177

## 2019-10-17 MED ORDER — OXYCODONE-ACETAMINOPHEN 5-325 MG PO TABS: 1.0000 | ORAL_TABLET | ORAL | 0 refills | Status: DC | PRN

## 2019-10-17 MED ORDER — ONDANSETRON HCL 4 MG/2ML IJ SOLN: 4.0000 mg | Freq: Four times a day (QID) | INTRAMUSCULAR | Status: DC | PRN

## 2019-10-17 MED ORDER — SODIUM CHLORIDE 0.9 % IV SOLN: INTRAVENOUS | Status: DC

## 2019-10-17 MED ORDER — PROPOFOL 10 MG/ML IV BOLUS
INTRAVENOUS | Status: DC | PRN
  Administered 2019-10-17: 200 mg via INTRAVENOUS

## 2019-10-17 MED ORDER — OXYCODONE HCL 5 MG PO TABS: 5.0000 mg | ORAL_TABLET | Freq: Once | ORAL | Status: DC | PRN

## 2019-10-17 MED ORDER — DEXAMETHASONE SODIUM PHOSPHATE 10 MG/ML IJ SOLN
INTRAMUSCULAR | Status: DC | PRN
  Administered 2019-10-17: 8 mg via INTRAVENOUS

## 2019-10-17 MED ORDER — DEXAMETHASONE SODIUM PHOSPHATE 10 MG/ML IJ SOLN
INTRAMUSCULAR | Status: AC
  Filled 2019-10-17: qty 1

## 2019-10-17 MED ORDER — METHOCARBAMOL 500 MG PO TABS
500.0000 mg | ORAL_TABLET | Freq: Four times a day (QID) | ORAL | Status: DC | PRN
  Administered 2019-10-17 – 2019-10-18 (×2): 500 mg via ORAL
  Filled 2019-10-17 (×3): qty 1

## 2019-10-17 MED ORDER — CEFAZOLIN SODIUM-DEXTROSE 2-4 GM/100ML-% IV SOLN
2.0000 g | Freq: Four times a day (QID) | INTRAVENOUS | Status: AC
  Administered 2019-10-17 – 2019-10-18 (×3): 2 g via INTRAVENOUS
  Filled 2019-10-17 (×3): qty 100

## 2019-10-17 MED ORDER — OXYCODONE HCL 5 MG/5ML PO SOLN: 5.0000 mg | Freq: Once | ORAL | Status: DC | PRN

## 2019-10-17 MED ORDER — BUPIVACAINE HCL (PF) 0.5 % IJ SOLN
INTRAMUSCULAR | Status: DC | PRN
  Administered 2019-10-17: 15 mL via PERINEURAL

## 2019-10-17 MED ORDER — MIDAZOLAM HCL 2 MG/2ML IJ SOLN: 0.5000 mg | Freq: Once | INTRAMUSCULAR | Status: DC | PRN

## 2019-10-17 MED ORDER — METOCLOPRAMIDE HCL 5 MG/ML IJ SOLN: 5.0000 mg | Freq: Three times a day (TID) | INTRAMUSCULAR | Status: DC | PRN

## 2019-10-17 MED ORDER — ONDANSETRON HCL 4 MG PO TABS: 4.0000 mg | ORAL_TABLET | Freq: Four times a day (QID) | ORAL | Status: DC | PRN

## 2019-10-17 MED ORDER — SUCCINYLCHOLINE CHLORIDE 200 MG/10ML IV SOSY
PREFILLED_SYRINGE | INTRAVENOUS | Status: DC | PRN
  Administered 2019-10-17: 140 mg via INTRAVENOUS

## 2019-10-17 MED ORDER — MIDAZOLAM HCL 2 MG/2ML IJ SOLN
1.0000 mg | Freq: Once | INTRAMUSCULAR | Status: AC
  Administered 2019-10-17: 2 mg via INTRAVENOUS
  Filled 2019-10-17: qty 2

## 2019-10-17 MED ORDER — OXYCODONE HCL 5 MG PO TABS
5.0000 mg | ORAL_TABLET | ORAL | Status: DC | PRN
  Administered 2019-10-17: 10 mg via ORAL
  Administered 2019-10-18 (×2): 5 mg via ORAL
  Filled 2019-10-17: qty 1
  Filled 2019-10-17: qty 2
  Filled 2019-10-17: qty 1

## 2019-10-17 MED ORDER — BUPIVACAINE LIPOSOME 1.3 % IJ SUSP
INTRAMUSCULAR | Status: DC | PRN
  Administered 2019-10-17: 10 mL via PERINEURAL

## 2019-10-17 MED ORDER — METHOCARBAMOL 500 MG IVPB - SIMPLE MED
INTRAVENOUS | Status: AC
  Filled 2019-10-17: qty 50

## 2019-10-17 MED ORDER — HYDROMORPHONE HCL 1 MG/ML IJ SOLN
0.2500 mg | INTRAMUSCULAR | Status: DC | PRN
  Administered 2019-10-17 (×4): 0.5 mg via INTRAVENOUS

## 2019-10-17 MED ORDER — PHENYLEPHRINE HCL (PRESSORS) 10 MG/ML IV SOLN
INTRAVENOUS | Status: AC
  Filled 2019-10-17: qty 1

## 2019-10-17 MED ORDER — BUPIVACAINE-EPINEPHRINE (PF) 0.25% -1:200000 IJ SOLN
INTRAMUSCULAR | Status: DC | PRN
  Administered 2019-10-17: 15 mL

## 2019-10-17 MED ORDER — OXYCODONE HCL 5 MG PO TABS
ORAL_TABLET | ORAL | Status: AC
  Filled 2019-10-17: qty 2

## 2019-10-17 MED ORDER — MAGNESIUM 500 MG PO TABS: 500.0000 mg | ORAL_TABLET | Freq: Two times a day (BID) | ORAL | Status: DC

## 2019-10-17 MED ORDER — BISACODYL 10 MG RE SUPP: 10.0000 mg | Freq: Every day | RECTAL | Status: DC | PRN

## 2019-10-17 MED ORDER — DOCUSATE SODIUM 100 MG PO CAPS
100.0000 mg | ORAL_CAPSULE | Freq: Two times a day (BID) | ORAL | Status: DC
  Administered 2019-10-17 – 2019-10-18 (×2): 100 mg via ORAL
  Filled 2019-10-17 (×2): qty 1

## 2019-10-17 MED ORDER — LIDOCAINE 2% (20 MG/ML) 5 ML SYRINGE
INTRAMUSCULAR | Status: DC | PRN
  Administered 2019-10-17: 40 mg via INTRAVENOUS

## 2019-10-17 MED ORDER — MEPERIDINE HCL 50 MG/ML IJ SOLN: 6.2500 mg | INTRAMUSCULAR | Status: DC | PRN

## 2019-10-17 MED ORDER — OXYCODONE HCL 5 MG PO TABS
10.0000 mg | ORAL_TABLET | ORAL | Status: DC | PRN
  Administered 2019-10-17 (×2): 10 mg via ORAL
  Administered 2019-10-18 (×2): 15 mg via ORAL
  Administered 2019-10-18: 10 mg via ORAL
  Filled 2019-10-17 (×2): qty 3
  Filled 2019-10-17 (×2): qty 2

## 2019-10-17 MED ORDER — ACETAMINOPHEN 325 MG PO TABS
325.0000 mg | ORAL_TABLET | Freq: Four times a day (QID) | ORAL | Status: DC | PRN
  Administered 2019-10-17: 650 mg via ORAL
  Filled 2019-10-17: qty 2

## 2019-10-17 MED ORDER — PROPOFOL 10 MG/ML IV BOLUS
INTRAVENOUS | Status: AC
  Filled 2019-10-17: qty 20

## 2019-10-17 MED ORDER — FENTANYL CITRATE (PF) 100 MCG/2ML IJ SOLN
50.0000 ug | Freq: Once | INTRAMUSCULAR | Status: AC
  Administered 2019-10-17: 100 ug via INTRAVENOUS
  Filled 2019-10-17: qty 2

## 2019-10-17 MED ORDER — MENTHOL 3 MG MT LOZG: 1.0000 | LOZENGE | OROMUCOSAL | Status: DC | PRN

## 2019-10-17 MED ORDER — METOCLOPRAMIDE HCL 5 MG PO TABS: 5.0000 mg | ORAL_TABLET | Freq: Three times a day (TID) | ORAL | Status: DC | PRN

## 2019-10-17 MED ORDER — 0.9 % SODIUM CHLORIDE (POUR BTL) OPTIME
TOPICAL | Status: DC | PRN
  Administered 2019-10-17: 11:00:00 1000 mL

## 2019-10-17 MED ORDER — MAGNESIUM OXIDE 400 (241.3 MG) MG PO TABS
400.0000 mg | ORAL_TABLET | Freq: Two times a day (BID) | ORAL | Status: DC
  Administered 2019-10-17 – 2019-10-18 (×2): 400 mg via ORAL
  Filled 2019-10-17 (×2): qty 1

## 2019-10-17 MED ORDER — CEFAZOLIN SODIUM-DEXTROSE 2-4 GM/100ML-% IV SOLN
2.0000 g | INTRAVENOUS | Status: AC
  Administered 2019-10-17: 2 g via INTRAVENOUS
  Filled 2019-10-17: qty 100

## 2019-10-17 MED ORDER — PROMETHAZINE HCL 25 MG/ML IJ SOLN: 6.2500 mg | INTRAMUSCULAR | Status: DC | PRN

## 2019-10-17 MED ORDER — STERILE WATER FOR IRRIGATION IR SOLN
Status: DC | PRN
  Administered 2019-10-17: 2000 mL

## 2019-10-17 MED ORDER — PHENYLEPHRINE HCL-NACL 10-0.9 MG/250ML-% IV SOLN
INTRAVENOUS | Status: DC | PRN
  Administered 2019-10-17: 20 ug/min via INTRAVENOUS

## 2019-10-17 MED ORDER — THROMBIN (RECOMBINANT) 5000 UNITS EX SOLR
CUTANEOUS | Status: AC
  Filled 2019-10-17: qty 5000

## 2019-10-17 MED ORDER — METHOCARBAMOL 500 MG PO TABS: 500.0000 mg | ORAL_TABLET | Freq: Four times a day (QID) | ORAL | 1 refills | Status: DC | PRN

## 2019-10-17 SURGICAL SUPPLY — 73 items
AID PSTN UNV HD RSTRNT DISP (MISCELLANEOUS) ×1
BAG SPEC THK2 15X12 ZIP CLS (MISCELLANEOUS) ×1
BAG ZIPLOCK 12X15 (MISCELLANEOUS) ×1 IMPLANT
BIT DRILL 1.6MX128 (BIT) ×2 IMPLANT
BIT DRILL 170X2.5X (BIT) IMPLANT
BIT DRL 170X2.5X (BIT) ×1
BLADE SAG 18X100X1.27 (BLADE) ×2 IMPLANT
CEMENT HV SMART SET (Cement) ×2 IMPLANT
COVER BACK TABLE 60X90IN (DRAPES) ×2 IMPLANT
COVER SURGICAL LIGHT HANDLE (MISCELLANEOUS) ×2 IMPLANT
COVER WAND RF STERILE (DRAPES) IMPLANT
CUP HUMERAL 42 PLUS 3 (Orthopedic Implant) ×1 IMPLANT
DECANTER SPIKE VIAL GLASS SM (MISCELLANEOUS) ×2 IMPLANT
DRAPE INCISE IOBAN 66X45 STRL (DRAPES) ×2 IMPLANT
DRAPE ORTHO SPLIT 77X108 STRL (DRAPES) ×4
DRAPE SHEET LG 3/4 BI-LAMINATE (DRAPES) ×2 IMPLANT
DRAPE SURG ORHT 6 SPLT 77X108 (DRAPES) ×2 IMPLANT
DRAPE U-SHAPE 47X51 STRL (DRAPES) ×2 IMPLANT
DRILL 2.5 (BIT) ×2
DRSG ADAPTIC 3X8 NADH LF (GAUZE/BANDAGES/DRESSINGS) ×2 IMPLANT
DRSG PAD ABDOMINAL 8X10 ST (GAUZE/BANDAGES/DRESSINGS) ×2 IMPLANT
DURAPREP 26ML APPLICATOR (WOUND CARE) ×2 IMPLANT
ELECT BLADE TIP CTD 4 INCH (ELECTRODE) ×3 IMPLANT
ELECT NDL TIP 2.8 STRL (NEEDLE) ×1 IMPLANT
ELECT NEEDLE TIP 2.8 STRL (NEEDLE) ×2 IMPLANT
ELECT REM PT RETURN 15FT ADLT (MISCELLANEOUS) ×2 IMPLANT
EPIPHYSI RIGHT SZ 2 (Shoulder) ×2 IMPLANT
EPIPHYSIS RIGHT SZ 2 (Shoulder) IMPLANT
GAUZE SPONGE 4X4 12PLY STRL (GAUZE/BANDAGES/DRESSINGS) ×2 IMPLANT
GLENOSPHERE XTEND RSA 42 SD +4 (Joint) ×1 IMPLANT
GLOVE BIOGEL PI ORTHO PRO 7.5 (GLOVE) ×1
GLOVE BIOGEL PI ORTHO PRO SZ8 (GLOVE) ×1
GLOVE ORTHO TXT STRL SZ7.5 (GLOVE) ×2 IMPLANT
GLOVE PI ORTHO PRO STRL 7.5 (GLOVE) ×1 IMPLANT
GLOVE PI ORTHO PRO STRL SZ8 (GLOVE) ×1 IMPLANT
GLOVE SURG ORTHO 8.5 STRL (GLOVE) ×3 IMPLANT
GOWN STRL REUS W/TWL XL LVL3 (GOWN DISPOSABLE) ×4 IMPLANT
KIT BASIN OR (CUSTOM PROCEDURE TRAY) ×2 IMPLANT
KIT TURNOVER KIT A (KITS) IMPLANT
MANIFOLD NEPTUNE II (INSTRUMENTS) ×2 IMPLANT
METAGLENE DELTA EXTEND (Trauma) IMPLANT
METAGLENE DXTEND (Trauma) ×2 IMPLANT
NDL MAYO CATGUT SZ4 TPR NDL (NEEDLE) ×1 IMPLANT
NEEDLE MAYO CATGUT SZ4 (NEEDLE) ×2 IMPLANT
PACK SHOULDER (CUSTOM PROCEDURE TRAY) ×2 IMPLANT
PASSER SUT SWANSON 36MM LOOP (INSTRUMENTS) IMPLANT
PIN GUIDE 1.2 (PIN) ×1 IMPLANT
PIN GUIDE GLENOPHERE 1.5MX300M (PIN) ×2 IMPLANT
PIN METAGLENE 2.5 (PIN) ×1 IMPLANT
PROTECTOR NERVE ULNAR (MISCELLANEOUS) ×2 IMPLANT
RESTRAINT HEAD UNIVERSAL NS (MISCELLANEOUS) ×2 IMPLANT
SCREW 4.5X18MM (Screw) ×4 IMPLANT
SCREW 4.5X36MM (Screw) ×1 IMPLANT
SCREW 48L (Screw) ×1 IMPLANT
SCREW BN 18X4.5XSTRL SHLDR (Screw) IMPLANT
SLING ARM FOAM STRAP LRG (SOFTGOODS) ×2 IMPLANT
SMARTMIX MINI TOWER (MISCELLANEOUS) ×2
SPONGE SURGIFOAM ABS GEL 12-7 (HEMOSTASIS) ×2 IMPLANT
STEM 12 HA (Stem) ×1 IMPLANT
STRIP CLOSURE SKIN 1/2X4 (GAUZE/BANDAGES/DRESSINGS) ×2 IMPLANT
SUCTION FRAZIER HANDLE 12FR (TUBING) ×1
SUCTION TUBE FRAZIER 12FR DISP (TUBING) ×1 IMPLANT
SUT FIBERWIRE #2 38 T-5 BLUE (SUTURE) ×14
SUT MNCRL AB 4-0 PS2 18 (SUTURE) ×2 IMPLANT
SUT VIC AB 0 CT1 36 (SUTURE) ×2 IMPLANT
SUT VIC AB 0 CT2 27 (SUTURE) ×2 IMPLANT
SUT VIC AB 2-0 CT1 27 (SUTURE) ×2
SUT VIC AB 2-0 CT1 TAPERPNT 27 (SUTURE) ×1 IMPLANT
SUTURE FIBERWR #2 38 T-5 BLUE (SUTURE) ×4 IMPLANT
TOWEL OR 17X26 10 PK STRL BLUE (TOWEL DISPOSABLE) ×2 IMPLANT
TOWER SMARTMIX MINI (MISCELLANEOUS) ×1 IMPLANT
YANKAUER SUCT BULB TIP 10FT TU (MISCELLANEOUS) ×2 IMPLANT
YANKAUER SUCT BULB TIP NO VENT (SUCTIONS) ×1 IMPLANT

## 2019-10-17 NOTE — Anesthesia Postprocedure Evaluation (Signed)
Anesthesia Post Note  Patient: Jahvier Aldea  Procedure(s) Performed: RIGHT REVERSE TOTAL SHOULDER REPLACEMENT (Right Shoulder)     Patient location during evaluation: PACU Anesthesia Type: General and Regional Level of consciousness: patient cooperative, oriented and awake and alert Pain management: pain level controlled Vital Signs Assessment: post-procedure vital signs reviewed and stable Respiratory status: spontaneous breathing, nonlabored ventilation and respiratory function stable Cardiovascular status: blood pressure returned to baseline and stable Postop Assessment: no apparent nausea or vomiting Anesthetic complications: no    Last Vitals:  Vitals:   10/17/19 1330 10/17/19 1345  BP: 122/82 124/76  Pulse: 92 90  Resp: 11 12  Temp:    SpO2: 94% 93%    Last Pain:  Vitals:   10/17/19 1345  TempSrc:   PainSc: Asleep                 Caralee Morea,E. Khristy Kalan

## 2019-10-17 NOTE — Progress Notes (Signed)
AssistedDr. Annye Asa with right, ultrasound guided, interscalene  block. Side rails up, monitors on throughout procedure. See vital signs in flow sheet. Tolerated Procedure well.

## 2019-10-17 NOTE — Brief Op Note (Signed)
10/17/2019  12:40 PM  PATIENT:  Zyler Hyson  69 y.o. male  PRE-OPERATIVE DIAGNOSIS:  Right shoulder osteoarthritis  rotator cuff partial tear  POST-OPERATIVE DIAGNOSIS:  Right shoulder osteoarthritis  rotator cuff partial tear  PROCEDURE:  Procedure(s) with comments: RIGHT REVERSE TOTAL SHOULDER REPLACEMENT (Right) - interscalene block DePuy Delta Xtend with Subscap repair  SURGEON:  Surgeon(s) and Role:    Netta Cedars, MD - Primary  PHYSICIAN ASSISTANT:   ASSISTANTS: Ventura Bruns, PA-C   ANESTHESIA:   regional and general  EBL:  250 mL   BLOOD ADMINISTERED:none  DRAINS: none   LOCAL MEDICATIONS USED:  MARCAINE     SPECIMEN:  No Specimen  DISPOSITION OF SPECIMEN:  N/A  COUNTS:  YES  TOURNIQUET:  * No tourniquets in log *  DICTATION: .Other Dictation: Dictation Number [id number]  PLAN OF CARE: Admit for overnight observation  PATIENT DISPOSITION:  PACU - hemodynamically stable.   Delay start of Pharmacological VTE agent (>24hrs) due to surgical blood loss or risk of bleeding: not applicable

## 2019-10-17 NOTE — Transfer of Care (Signed)
Immediate Anesthesia Transfer of Care Note  Patient: [name] [name]  Procedure(s) Performed: RIGHT REVERSE TOTAL SHOULDER REPLACEMENT (Right Shoulder)  Patient Location: PACU  Anesthesia Type:General  Level of Consciousness: awake, alert  and oriented  Airway & Oxygen Therapy: Patient Spontanous Breathing and Patient connected to face mask oxygen  Post-op Assessment: Report given to RN and Post -op Vital signs reviewed and stable  Post vital signs: Reviewed and stable  Last Vitals:  Vitals Value Taken Time  BP 150/87 10/17/19 1300  Temp    Pulse 87 10/17/19 1302  Resp    SpO2 98 % 10/17/19 1302  Vitals shown include unvalidated device data.  Last Pain:  Vitals:   10/17/19 0939  TempSrc:   PainSc: 0-No pain         Complications: No apparent anesthesia complications

## 2019-10-17 NOTE — Anesthesia Preprocedure Evaluation (Signed)
Anesthesia Evaluation  Patient identified by MRN, date of birth, ID band Patient awake    Reviewed: Allergy & Precautions, NPO status , Patient's Chart, lab work & pertinent test results  History of Anesthesia Complications Negative for: history of anesthetic complications  Airway Mallampati: II  TM Distance: >3 FB Neck ROM: Full    Dental  (+) Chipped, Dental Advisory Given   Pulmonary former smoker (quit 2013),  10/14/2019 SARS coronavirus NEG   breath sounds clear to auscultation       Cardiovascular (-) anginanegative cardio ROS   Rhythm:Regular Rate:Normal     Neuro/Psych  Headaches, Anxiety TIA   GI/Hepatic negative GI ROS, (+) Hepatitis - (treated 2001), C  Endo/Other  negative endocrine ROS  Renal/GU negative Renal ROS     Musculoskeletal  (+) Arthritis ,   Abdominal   Peds  Hematology negative hematology ROS (+)   Anesthesia Other Findings   Reproductive/Obstetrics                             Anesthesia Physical Anesthesia Plan  ASA: III  Anesthesia Plan: General   Post-op Pain Management: GA combined w/ Regional for post-op pain   Induction: Intravenous  PONV Risk Score and Plan: 2 and Ondansetron and Dexamethasone  Airway Management Planned: Oral ETT  Additional Equipment:   Intra-op Plan:   Post-operative Plan: Extubation in OR  Informed Consent: I have reviewed the patients History and Physical, chart, labs and discussed the procedure including the risks, benefits and alternatives for the proposed anesthesia with the patient or authorized representative who has indicated his/her understanding and acceptance.     Dental advisory given  Plan Discussed with: CRNA and Surgeon  Anesthesia Plan Comments: (Plan routine monitors, GETA with interscalene block for post op analgesia)        Anesthesia Quick Evaluation

## 2019-10-17 NOTE — Interval H&P Note (Signed)
History and Physical Interval Note:  10/17/2019 2:44 AM  [name] [name]  has presented today for surgery, with the diagnosis of Right shoulder osteoarthritis  rotator cuff and subscap partial tear.  The various methods of treatment have been discussed with the patient and family. After consideration of risks, benefits and other options for treatment, the patient has consented to  Procedure(s) with comments: TOTAL SHOULDER ARTHROPLASTY with rotator cuff repair and subscap repair (Right) - interscalene block as a surgical intervention.  We also discussed the possibility that if the  rotator cuff tendons are not repairable then we would proceed with Reverse TSA.  The patient agrees with this plan. The patient's history has been reviewed, patient examined, no change in status, stable for surgery.  I have reviewed the patient's chart and labs.  Questions were answered to the patient's satisfaction.     [name] [name]

## 2019-10-17 NOTE — Anesthesia Procedure Notes (Signed)
Anesthesia Regional Block: Interscalene brachial plexus block   Pre-Anesthetic Checklist: ,, timeout performed, Correct Patient, Correct Site, Correct Laterality, Correct Procedure, Correct Position, site marked, Risks and benefits discussed,  Surgical consent,  Pre-op evaluation,  At surgeon's request and post-op pain management  Laterality: Right and Upper  Prep: chloraprep       Needles:   Needle Type: Echogenic Stimulator Needle     Needle Length: 9cm  Needle Gauge: 21     Additional Needles:   Procedures:, nerve stimulator,,, ultrasound used (permanent image in chart),,,,   Nerve Stimulator or Paresthesia:  Response: hand twitch, 0.4 mA, 0.1 ms,   Additional Responses:   Narrative:  Start time: 10/17/2019 9:28 AM End time: 10/17/2019 9:35 AM Injection made incrementally with aspirations every 5 mL.  Performed by: Personally  Anesthesiologist: Annye Asa, MD  Additional Notes: Pt identified in Holding room.  Monitors applied. Working IV access confirmed. Sterile prep R clavicle and neck.  #21ga ECHOgenic PNS to hand twitch at 0.21mA threshold with US guidance.  15cc 0.5% Bupivacaine with Exparel injected incrementally after negative test dose.  Patient asymptomatic, VSS, no heme aspirated, tolerated well.   Jenita Seashore, MD

## 2019-10-17 NOTE — Op Note (Signed)
NAMEGENTRY, PILSON MEDICAL RECORD JX:[id number] ACCOUNT [id number] DATE OF BIRTH:1951/01/04 FACILITY: [facility] LOCATION: [facility] PHYSICIAN:STEVEN R. Bradley Handyside, MD  OPERATIVE REPORT  DATE OF PROCEDURE:  10/17/2019  PREOPERATIVE DIAGNOSIS:  Right shoulder end-stage arthritis with rotator cuff repair and subscapularis tear.  POSTOPERATIVE DIAGNOSIS:  Right shoulder end-stage arthritis with rotator cuff repair and subscap tear.  PROCEDURE PERFORMED:  Right reverse total shoulder replacement using DePuy Delta Xtend prosthesis with subscapularis repair.  ATTENDING SURGEON:  Esmond Plants, MD  ASSISTANT:  Darol Destine, [place], who was scrubbed during the entire procedure and necessary for satisfactory completion of surgery.  ANESTHESIA:  General anesthesia was used plus interscalene block.  ESTIMATED BLOOD LOSS:  250 mL.  FLUID REPLACEMENT:  1500 mL crystalloid.  INSTRUMENT COUNTS:  Correct.  COMPLICATIONS:  No complications.  ANTIBIOTICS:  Perioperative antibiotics were given.  INDICATIONS:  The patient is a 69 year old male with a history of worsening right shoulder pain and dysfunction secondary to end-stage arthritis.  The patient has tears of the supraspinatus, infraspinatus and the subscap.  We discussed options for  treatment given that he has bone-on-bone and having progressive pain requiring narcotic pain medication, we recommended shoulder arthroplasty.  Our plan was to try an anatomic replacement given his high activity status and his age of 69, however, if the  rotator cuff did not seem to be repairable, we would move forward with reverse shoulder replacement to restore fixed focal mechanics.  Risks and benefits of surgery discussed.  Informed consent obtained.  DESCRIPTION OF PROCEDURE:  After an adequate level of anesthesia was achieved, the patient was positioned in the modified beach chair position.  Right shoulder correctly identified and sterilely prepped and draped in  the usual manner.  Time-out called,  verifying correct patient and correct site.  We entered the shoulder using a standard deltopectoral approach after a timeout with a 10 blade scalpel, dissection down through subcutaneous tissues using the Bovie.  We identified the cephalic vein, took  that laterally with the deltoid, pectoralis taken medially.  Conjoined tendon identified and retracted medially.  Deep retractors placed.  Biceps was tenodesed in situ with 0 Vicryl figure-of-eight suture x2.  We then released the subscapularis  subperiosteally off the lesser tuberosity and tagged for repair.  We released the inferior capsule, progressively externally rotating.  I then palpated and inspected the rotator cuff.  There was extensive tearing starting at the lateral border of the  greater tuberosity and extending medially for about a centimeter to 2 cm with delamination of the supraspinatus tendon.  The infraspinatus was also torn.  This was near full thickness with just a rim of tissue.  I felt like the quality of the tendon was  not satisfactory to guarantee that he would actually heal it post-anatomic shoulder replacement.  Thus, we decided at this time to move forward with reverse shoulder replacement to provide a predictable, long lasting result.  At this point, we externally  rotated the humerus, delivering it out of the wound, entered the proximal humerus with a 6 mm reamer, reaming up to a size 12.  We then used a 12 intramedullary resection guide to resect the head at 10 degrees of retroversion.  We removed excess  osteophytes with a rongeur.  We then subluxed the humerus posteriorly, gaining exposure of the glenoid face, which was eroded bone-on-bone with cysts.  We removed the capsule and labrum.  The capsule was extremely hypertrophied.  The labrum was also  thickened.  This was removed  in addition to the biceps stump.  We gained good exposure of the glenoid face.  We then placed our guide pin for our  metaglene baseplate and then reamed for that, angling inferiorly slightly to decrease the chance for  impingement.  Once we had our baseplate reaming done, we drilled out our central peg hole, did our peripheral hand reaming and then impacted the press-fit glenoid baseplate into place.  Once that was there, we placed a 48 screw inferiorly, a 36 screw at  the superior aspect and then two 18s anteriorly and posteriorly.  We had great baseplate fixation.  We then took a 42+4 glenosphere and seated that on the metaglene baseplate with some difficulty just from exposure, but we were able to get that seated  well and then the screw tightened.  We impacted and then retightened with the screw.  I did a finger sweep to make sure that the glenosphere was fully seated and was secure and there was no soft tissue balance between the baseplate and the glenosphere.   At this point, we directed our attention back towards the humeral side.  We did our reaming for a 2 right epiphysis and then selected the 12 stem with the 2 right set on the 0 setting and placed in 10 degrees of retroversion.  We impacted that in  position, marked our version and then reduced with a 42+3.  It was difficult to actually get it unreduced.  It was very stable throughout a full arc of motion.  We eventually did get it unreduced and removed all trial components from the humeral side.   We irrigated the canal and drilled holes in the lesser tuberosity, placed #2 FiberWire suture for repair of the subscap.  We then used impaction grafting technique with available bone graft from the humeral head and impacted the stem.  It was an HA  coated 12 stem with a 2 right epiphysis set on the 0 setting and impacted in 10 degrees of retroversion.  We had excellent security of the stem.  We then used a 42+3 poly, impacted that in position, reduced the shoulder, irrigated thoroughly.  We were  pleased with our soft tissue balancing.  The axillary nerve was a  little tight, but not too tight and the conjoined tensioned appropriately.  No gapping with external rotation or inferior pole.  We then repaired the subscapularis anatomically back to the  lesser tuberosity with the appropriate tension.  We were pleased with that repair.  We then irrigated again, closed the deltopectoral interval with 0 Vicryl suture, followed by 2-0 Vicryl for subcutaneous closure and 4-0 Monocryl for skin.  Steri-Strips  applied, followed by sterile dressing.  The patient tolerated the surgery well.  VN/NUANCE  D:10/17/2019 T:10/17/2019 JOB:[id number]

## 2019-10-17 NOTE — Discharge Instructions (Signed)
Ice to the shoulder constantly.  Keep the incision covered and clean and dry for one week, then ok to get it wet in the shower.  Do exercise as instructed several times per day. DO NOT push pull or lift with the right arm.  DO NOT reach behind your back or push up out of a chair with the operative arm.  Use a sling while you are up and around for comfort, may remove while seated.  Keep pillow propped behind the operative elbow to keep the arm across your body and so you can see your elbow.  Follow up with Dr Veverly Fells in two weeks in the office, call [phone] for appt

## 2019-10-17 NOTE — Anesthesia Procedure Notes (Signed)
Procedure Name: Intubation Date/Time: 10/17/2019 10:14 AM Performed by: Eben Burow, CRNA Pre-anesthesia Checklist: Patient identified, Emergency Drugs available, Suction available, Patient being monitored and Timeout performed Patient Re-evaluated:Patient Re-evaluated prior to induction Oxygen Delivery Method: Circle system utilized Preoxygenation: Pre-oxygenation with 100% oxygen Induction Type: IV induction Ventilation: Mask ventilation without difficulty Laryngoscope Size: Mac and 4 Grade View: Grade II Tube type: Oral Number of attempts: 1 Airway Equipment and Method: Stylet Placement Confirmation: ETT inserted through vocal cords under direct vision,  positive ETCO2 and breath sounds checked- equal and bilateral Secured at: 22 cm Tube secured with: Tape Dental Injury: Teeth and Oropharynx as per pre-operative assessment

## 2019-10-18 DIAGNOSIS — Z96653 Presence of artificial knee joint, bilateral: Secondary | ICD-10-CM | POA: Diagnosis not present

## 2019-10-18 DIAGNOSIS — Z87891 Personal history of nicotine dependence: Secondary | ICD-10-CM | POA: Diagnosis not present

## 2019-10-18 DIAGNOSIS — M19011 Primary osteoarthritis, right shoulder: Secondary | ICD-10-CM | POA: Diagnosis not present

## 2019-10-18 DIAGNOSIS — Z888 Allergy status to other drugs, medicaments and biological substances status: Secondary | ICD-10-CM | POA: Diagnosis not present

## 2019-10-18 DIAGNOSIS — M75111 Incomplete rotator cuff tear or rupture of right shoulder, not specified as traumatic: Secondary | ICD-10-CM | POA: Diagnosis not present

## 2019-10-18 DIAGNOSIS — M8580 Other specified disorders of bone density and structure, unspecified site: Secondary | ICD-10-CM | POA: Diagnosis not present

## 2019-10-18 DIAGNOSIS — Z96611 Presence of right artificial shoulder joint: Secondary | ICD-10-CM | POA: Diagnosis not present

## 2019-10-18 LAB — BASIC METABOLIC PANEL
Anion gap: 14 (ref 5–15)
BUN: 16 mg/dL (ref 8–23)
CO2: 23 mmol/L (ref 22–32)
Calcium: 8.3 mg/dL — ABNORMAL LOW (ref 8.9–10.3)
Chloride: 97 mmol/L — ABNORMAL LOW (ref 98–111)
Creatinine, Ser: 0.82 mg/dL (ref 0.61–1.24)
GFR calc Af Amer: >60 mL/min (ref >60)
GFR calc non Af Amer: >60 mL/min (ref >60)
Glucose, Bld: 150 mg/dL — ABNORMAL HIGH (ref 70–99)
Potassium: 3.7 mmol/L (ref 3.5–5.1)
Sodium: 134 mmol/L — ABNORMAL LOW (ref 135–145)

## 2019-10-18 LAB — HEMOGLOBIN AND HEMATOCRIT, BLOOD
HCT: 40.1 % (ref 39.0–52.0)
Hemoglobin: 13.5 g/dL (ref 13.0–17.0)

## 2019-10-18 NOTE — Discharge Summary (Signed)
Orthopedic Discharge Summary        Physician Discharge Summary  Patient ID: [name] [name] MRN: [id number] DOB/AGE: 1950/09/28 69 y.o.  Admit date: 10/17/2019 Discharge date: 10/18/2019   Procedures:  Procedure(s) (LRB): RIGHT REVERSE TOTAL SHOULDER REPLACEMENT (Right)  Attending Physician:  Dr. Esmond Plants  Admission Diagnoses:   Right shoulder end stage OA, rotator cuff insufficiency  Discharge Diagnoses:  Same   Past Medical History:  Diagnosis Date  . Arthritis   . Carpal tunnel syndrome    Right  . Cataracts, bilateral    very mild  . Headache 2011   HX OF VISUAL MIGRAINES - NO PAIN --MIGRAINES ATTRIBUTED TO PT CUTTING NICOTINE PATCH IN HALF BEFORE APPLYING TO HIS SKIN.    Marland Kitchen Hepatitis 2001   C -treated and no problems since  2001 'cured'  . Muscle cramps    hands and legs  . Osteopenia   . Pain    KNEE OA  . Shortness of breath dyspnea    "SOMETIMES FEELS LIKE I CAN'T CATCH A DEEP BREATH "- ON GOING FOR YEARS - [place] WORK UP 2011 - [place]  . Stroke ([place])    HX POSSIBLE TIA - RT LEG NUMBNESS AND TINGLING FINGERS AND NUMBNESS RT FACE - ALL RESOLVED ; STATES NEUROLOGIST COULD NOT FIND ANY EVIDENCE OF STROKE - THOUGHT PT'S EPISODE MIGHT BE FROM CUTTING NICOTINE PATCH IN HALF AND APPLYING TO SKIN  . Trigger finger    LEFT MIDDLE FINGER    PCP: Gaynelle Arabian, MD   Discharged Condition: good  Hospital Course:  Patient underwent the above stated procedure on 10/17/2019. Patient tolerated the procedure well and brought to the recovery room in good condition and subsequently to the floor. Patient had an uncomplicated hospital course and was stable for discharge.   Disposition: Discharge disposition: 01-Home or Self Care      with follow up in 2 weeks   Follow-up Information    Netta Cedars, MD. Call in 2 weeks.   Specialty: Orthopedic Surgery Why: [phone] Contact information: [address]  [place] [address] [phone]           Discharge Instructions    Call MD / Call 911   Complete by: As directed    If you experience chest pain or shortness of breath, CALL 911 and be transported to the hospital emergency room.  If you develope a fever above 101 F, pus (white drainage) or increased drainage or redness at the wound, or calf pain, call your surgeon's office.   Constipation Prevention   Complete by: As directed    Drink plenty of fluids.  Prune juice may be helpful.  You may use a stool softener, such as Colace (over the counter) 100 mg twice a day.  Use MiraLax (over the counter) for constipation as needed.   Diet - low sodium heart healthy   Complete by: As directed    Driving restrictions   Complete by: As directed    No driving for 3 weeks   Increase activity slowly as tolerated   Complete by: As directed    Lifting restrictions   Complete by: As directed    No lifting for 6 weeks      Allergies as of 10/18/2019      Reactions   Statins Other (See Comments)   MYALGIAS      Medication List    STOP taking these medications  HYDROcodone-acetaminophen 5-325 MG tablet Commonly known as: NORCO/VICODIN   traMADol 50 MG tablet Commonly known as: ULTRAM     TAKE these medications   ibuprofen 200 MG tablet Commonly known as: ADVIL Take 400-600 mg by mouth every 4 (four) hours as needed for headache or mild pain (for pain.).   Magnesium 500 MG Tabs Take 500 mg by mouth 2 (two) times daily.   methocarbamol 500 MG tablet Commonly known as: Robaxin Take 1 tablet (500 mg total) by mouth every 6 (six) hours as needed.   oxyCODONE-acetaminophen 5-325 MG tablet Commonly known as: Percocet Take 1-2 tablets by mouth every 4 (four) hours as needed for severe pain.         Signed: QUAYSHAUN HUBBERT 10/18/2019, 7:56 AM  [facility] is now [facility] [address], [place], [place], [place] [address] Phone:  [place]

## 2019-10-18 NOTE — Progress Notes (Signed)
Orthopedics Progress Note  Subjective: Pain poorly controlled overnight  Objective:  Vitals:   10/18/19 0127 10/18/19 0455  BP: 124/78 126/81  Pulse: 84 87  Resp: 17 16  Temp: 98.2 F (36.8 C) 98 F (36.7 C)  SpO2: 93% 93%    General: Awake and alert  Musculoskeletal: Right shoulder dressing changed, moderate swelling but no drainage Neurovascularly intact  Lab Results  Component Value Date   WBC 7.4 10/14/2019   HGB 13.5 10/18/2019   HCT 40.1 10/18/2019   MCV 98.1 10/14/2019   PLT 188 10/14/2019       Component Value Date/Time   NA 134 (L) 10/18/2019 0332   K 3.7 10/18/2019 0332   CL 97 (L) 10/18/2019 0332   CO2 23 10/18/2019 0332   GLUCOSE 150 (H) 10/18/2019 0332   BUN 16 10/18/2019 0332   CREATININE 0.82 10/18/2019 0332   CALCIUM 8.3 (L) 10/18/2019 0332   GFRNONAA >60 10/18/2019 0332   GFRAA >60 10/18/2019 0332    Lab Results  Component Value Date   INR 1.03 07/16/2014   INR 1.02 07/07/2013   INR 1.0 10/30/2007    Assessment/Plan: POD #1 s/p Procedure(s): RIGHT REVERSE TOTAL SHOULDER REPLACEMENT Stable s/p right Reverse TSA OT this morning and then discharge home Follow up in two weeks in the office  Remo Lipps R. Veverly Fells, MD 10/18/2019 7:54 AM

## 2019-10-18 NOTE — Progress Notes (Signed)
Pt stable at time of discharge home. Pt medicated for pain prior to d/c. No questions on d/c instructions and education given. Family at bedside for instructions.

## 2019-10-18 NOTE — Plan of Care (Signed)
?  Problem: Clinical Measurements: ?Goal: Respiratory complications will improve ?Outcome: Progressing ?  ?Problem: Clinical Measurements: ?Goal: Cardiovascular complication will be avoided ?Outcome: Progressing ?  ?Problem: Nutrition: ?Goal: Adequate nutrition will be maintained ?Outcome: Progressing ?  ?Problem: Pain Managment: ?Goal: General experience of comfort will improve ?Outcome: Progressing ?  ?

## 2019-10-18 NOTE — Plan of Care (Signed)
Pt to d/c home today with family. No needs at this time.

## 2019-10-18 NOTE — Progress Notes (Signed)
Occupational Therapy Evaluation Patient Details Name: [name] [name] MRN: [id number] DOB: Dec 05, 1950 Today's Date: 10/18/2019    History of Present Illness 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 PMH of   Clinical Impression   PTA pt PLOF living at home with family, I with ADLs with use of some AE as needed. Pt currently limited due to Pain, limitation with ROM/mobilization. Pt/caregiver educated of sling, UB dressing, bathing, UE exercises with handout, demonstration and teachback. DC recommendation to follow MD protocol, outpatient OT once pt is cleared to maximize independence and function UE ADLs.    Follow Up Recommendations  Follow surgeon's recommendation for DC plan and follow-up therapies    Equipment Recommendations  3 in 1 bedside commode    Recommendations for Other Services       Precautions / Restrictions Precautions Precautions: Shoulder Type of Shoulder Precautions: No AROM of R shd.  Shoulder Interventions: Shoulder sling/immobilizer Precaution Booklet Issued: Yes (comment) Required Braces or Orthoses: Sling Restrictions Weight Bearing Restrictions: Yes RUE Weight Bearing: Non weight bearing      Mobility Bed Mobility               General bed mobility comments: not tested, pt received in chair upon arrival  Transfers Overall transfer level: Modified independent                    Balance                                           ADL either performed or assessed with clinical judgement   ADL Overall ADL's : Needs assistance/impaired Eating/Feeding: Set up;Minimal assistance;Sitting   Grooming: Wash/dry hands;Wash/dry face;Oral care;Set up   Upper Body Bathing: Minimal assistance;Adhering to UE precautions;Cueing for safety;Cueing for sequencing;Cueing for compensatory techniques;Cueing for UE  precautions;Sitting   Lower Body Bathing: Moderate assistance;Sitting/lateral leans   Upper Body Dressing : Minimal assistance;Sitting;Adhering to UE precautions;Cueing for safety;Cueing for sequencing;Cueing for compensatory techniques;Cueing for UE precautions                   Functional mobility during ADLs: Modified independent General ADL Comments: Pt limited with function of RUE due to pain and immobilization. Pt educated of don/doff of sling, ub dressing with hand out provided demonstration and teach back     Vision Baseline Vision/History: Wears glasses       Perception     Praxis      Pertinent Vitals/Pain Pain Assessment: 0-10 Pain Score: 8  Pain Descriptors / Indicators: Aching;Guarding;Grimacing Pain Intervention(s): Limited activity within patient's tolerance;Monitored during session;Repositioned;Ice applied     Hand Dominance Right   Extremity/Trunk Assessment Upper Extremity Assessment Upper Extremity Assessment: RUE deficits/detail RUE: Unable to fully assess due to immobilization;Unable to fully assess due to pain           Communication Communication Communication: No difficulties   Cognition Arousal/Alertness: Awake/alert Behavior During Therapy: WFL for tasks assessed/performed(slightly irritable due to pain) Overall Cognitive Status: Within Functional Limits for tasks assessed                                     General Comments  Exercises Exercises: General Upper Extremity General Exercises - Upper Extremity Elbow Flexion: AROM;Right;Seated Elbow Extension: AROM;Right;Seated Wrist Flexion: AROM;Right;Seated Wrist Extension: AROM;Right;Seated   Shoulder Instructions      Home Living Family/patient expects to be discharged to:: Private residence Living Arrangements: Spouse/significant other Available Help at Discharge: Family Type of Home: House Home Access: Level entry     Home Layout: One level      Bathroom Shower/Tub: Occupational psychologist: Handicapped height     Home Equipment: Environmental consultant - 2 wheels;Cane - quad          Prior Functioning/Environment Level of Independence: Independent                 OT Problem List: Decreased range of motion;Decreased safety awareness;Decreased knowledge of precautions;Pain;Impaired UE functional use      OT Treatment/Interventions:      OT Goals(Current goals can be found in the care plan section) Acute Rehab OT Goals Patient Stated Goal: no goal stated OT Goal Formulation: With patient Time For Goal Achievement: 11/01/19 Potential to Achieve Goals: Fair  OT Frequency:     Barriers to D/C:            Co-evaluation              AM-PAC OT "6 Clicks" Daily Activity     Outcome Measure Help from another person eating meals?: A Little Help from another person taking care of personal grooming?: A Little Help from another person toileting, which includes using toliet, bedpan, or urinal?: A Little Help from another person bathing (including washing, rinsing, drying)?: A Little Help from another person to put on and taking off regular upper body clothing?: A Lot Help from another person to put on and taking off regular lower body clothing?: A Little 6 Click Score: 17   End of Session Equipment Utilized During Treatment: Other (comment)(sling) Nurse Communication: Weight bearing status;Precautions  Activity Tolerance: Patient limited by pain Patient left: in chair;with call bell/phone within reach  OT Visit Diagnosis: Pain Pain - Right/Left: Right Pain - part of body: Shoulder                Time: 1001-1026 OT Time Calculation (min): 25 min Charges:  OT General Charges $OT Visit: 1 Visit OT Evaluation $OT Eval Low Complexity: 1 Low OT Treatments $Self Care/Home Management : 8-22 mins  Minus Breeding, MSOT, OTR/L  Supplemental Rehabilitation Services  [phone]   Marius Ditch 10/18/2019, 11:11  AM

## 2019-10-19 ENCOUNTER — Ambulatory Visit: Payer: BC Managed Care – PPO

## 2019-10-19 ENCOUNTER — Ambulatory Visit: Payer: Medicare Other | Attending: Internal Medicine

## 2019-10-19 DIAGNOSIS — Z23 Encounter for immunization: Secondary | ICD-10-CM

## 2019-10-19 NOTE — Progress Notes (Signed)
   Covid-19 Vaccination Clinic  Name:  [name] [name]    MRN: [id number] DOB: 03/24/1951  11/14/7253  [name] was observed post Covid-19 immunization for 15 minutes without incidence. He was provided with Vaccine Information Sheet and instruction to access the V-Safe system.   [name] was instructed to call 911 with any severe reactions post vaccine: Marland Kitchen Difficulty breathing  . Swelling of your face and throat  . A fast heartbeat  . A bad rash all over your body  . Dizziness and weakness    Immunizations Administered    Name Date Dose VIS Date Route   Pfizer COVID-19 Vaccine 10/19/2019 12:32 PM 0.3 mL 08/22/2019 Intramuscular   Manufacturer: [place]   Lot: [id number]   [place]: [id number]

## 2019-10-20 ENCOUNTER — Encounter: Payer: Self-pay | Admitting: *Deleted

## 2019-11-04 DIAGNOSIS — Z4789 Encounter for other orthopedic aftercare: Secondary | ICD-10-CM | POA: Diagnosis not present

## 2019-11-12 ENCOUNTER — Ambulatory Visit: Payer: Medicare Other | Attending: Internal Medicine

## 2019-11-12 ENCOUNTER — Ambulatory Visit: Payer: BC Managed Care – PPO

## 2019-11-12 DIAGNOSIS — Z23 Encounter for immunization: Secondary | ICD-10-CM

## 2019-11-12 NOTE — Progress Notes (Signed)
   Covid-19 Vaccination Clinic  Name:  Jayesh Marbach    MRN: [id number] DOB: 1951-05-15  09/12/1971  Mr. Vandevoort was observed post Covid-19 immunization for 15 minutes without incident. He was provided with Vaccine Information Sheet and instruction to access the V-Safe system.   Mr. Haggar was instructed to call 911 with any severe reactions post vaccine: Marland Kitchen Difficulty breathing  . Swelling of face and throat  . A fast heartbeat  . A bad rash all over body  . Dizziness and weakness   Immunizations Administered    Name Date Dose VIS Date Route   Pfizer COVID-19 Vaccine 11/12/2019 10:12 AM 0.3 mL 08/22/2019 Intramuscular   Manufacturer: [place]   Lot: [id number]   [place]: [id number]

## 2019-12-25 ENCOUNTER — Ambulatory Visit: Payer: BLUE CROSS/BLUE SHIELD | Admitting: Podiatry

## 2020-01-12 ENCOUNTER — Ambulatory Visit (INDEPENDENT_AMBULATORY_CARE_PROVIDER_SITE_OTHER): Payer: Medicare Other

## 2020-01-12 ENCOUNTER — Other Ambulatory Visit: Payer: Self-pay

## 2020-01-12 ENCOUNTER — Ambulatory Visit (INDEPENDENT_AMBULATORY_CARE_PROVIDER_SITE_OTHER): Payer: Medicare Other | Admitting: Podiatry

## 2020-01-12 ENCOUNTER — Other Ambulatory Visit: Payer: Self-pay | Admitting: Podiatry

## 2020-01-12 DIAGNOSIS — M79671 Pain in right foot: Secondary | ICD-10-CM

## 2020-01-12 DIAGNOSIS — M79672 Pain in left foot: Secondary | ICD-10-CM

## 2020-01-12 DIAGNOSIS — M205X1 Other deformities of toe(s) (acquired), right foot: Secondary | ICD-10-CM

## 2020-01-12 DIAGNOSIS — M205X2 Other deformities of toe(s) (acquired), left foot: Secondary | ICD-10-CM

## 2020-01-12 DIAGNOSIS — L600 Ingrowing nail: Secondary | ICD-10-CM | POA: Diagnosis not present

## 2020-01-12 MED ORDER — GENTAMICIN SULFATE 0.1 % EX CREA: 1.0000 "application " | TOPICAL_CREAM | Freq: Two times a day (BID) | CUTANEOUS | 1 refills | Status: DC

## 2020-01-14 NOTE — Progress Notes (Signed)
Subjective: Patient presents today for evaluation of pain to the medial border of the right great toe that has been ongoing for the past few months. Patient is concerned for possible ingrown nail. Wearing shoes increase the pain. He has not done anything for treatment.  He also reports pain to the bilateral great toes that has been ongoing for the past 10 years. He reports an associated red lesion on the dorsal right great toe. He states the toes rub against his shoes. He has been using toe protectors for treatment. Patient presents today for further treatment and evaluation.   Past Medical History:  Diagnosis Date  . Arthritis   . Carpal tunnel syndrome    Right  . Cataracts, bilateral    very mild  . Headache 2011   HX OF VISUAL MIGRAINES - NO PAIN --MIGRAINES ATTRIBUTED TO PT CUTTING NICOTINE PATCH IN HALF BEFORE APPLYING TO HIS SKIN.    Marland Kitchen Hepatitis 2001   C -treated and no problems since  2001 'cured'  . Muscle cramps    hands and legs  . Osteopenia   . Pain    KNEE OA  . Shortness of breath dyspnea    "SOMETIMES FEELS LIKE I CAN'T CATCH A DEEP BREATH "- ON GOING FOR YEARS - [place] WORK UP 2011 - [place]  . Stroke ([place])    HX POSSIBLE TIA - RT LEG NUMBNESS AND TINGLING FINGERS AND NUMBNESS RT FACE - ALL RESOLVED ; STATES NEUROLOGIST COULD NOT FIND ANY EVIDENCE OF STROKE - THOUGHT PT'S EPISODE MIGHT BE FROM CUTTING NICOTINE PATCH IN HALF AND APPLYING TO SKIN  . Trigger finger    LEFT MIDDLE FINGER    Objective:  General: Well developed, nourished, in no acute distress, alert and oriented x3   Dermatology: Skin is warm, dry and supple bilateral. Medial border of the right great toe appears to be erythematous with evidence of an ingrowing nail. Pain on palpation noted to the border of the nail fold. The remaining nails appear unremarkable at this time. There are no open sores, lesions.  Vascular: Dorsalis Pedis artery and Posterior  Tibial artery pedal pulses palpable. No lower extremity edema noted.   Neruologic: Grossly intact via light touch bilateral.  Musculoskeletal: Pain on palpation with limited range of motion noted to the first MPJ of the bilateral feet.  Radiographic Exam: Degenerative changes noted with joint space narrowing first MPJ. There also appears to be extra-articular spurring noted about the joint.   Assesement: #1 Paronychia with ingrowing nail medial border right hallux - recurrent  #2 Pain in toe #3 Incurvated nail #4 hallux limitus / DJD bilateral  #5 h/o ingrown procedures x 2 right   Plan of Care:  1. Patient evaluated.  2. Discussed treatment alternatives and plan of care. Explained nail avulsion procedure and post procedure course to patient. 3. Patient opted for permanent partial nail avulsion of the medial border of the right hallux.  4. Prior to procedure, local anesthesia infiltration utilized using 3 ml of a 50:50 mixture of 2% plain lidocaine and 0.5% plain marcaine in a normal hallux block fashion and a betadine prep performed.  5. Partial permanent nail avulsion with chemical matrixectomy performed using [id number] applications of phenol followed by alcohol flush.  6. Light dressing applied. 7. Prescription for Gentamicin cream provided to patient to use daily with a bandage.  8. Patient does not want surgery. Recommended wide fitting shoes.  9.  Silicone toe caps dispensed.  10. Return to clinic as needed.  Edrick Kins, DPM [facility]  Dr. Edrick Kins, [address]                                        [place], [place] [address]                Office [phone]  Fax [phone]

## 2020-02-02 ENCOUNTER — Other Ambulatory Visit: Payer: Self-pay

## 2020-02-02 ENCOUNTER — Encounter: Payer: Self-pay | Admitting: Internal Medicine

## 2020-02-02 ENCOUNTER — Ambulatory Visit (INDEPENDENT_AMBULATORY_CARE_PROVIDER_SITE_OTHER): Payer: Medicare Other | Admitting: Internal Medicine

## 2020-02-02 VITALS — BP 119/79 | HR 95 | Temp 97.0°F | Ht 73.0 in | Wt 226.2 lb

## 2020-02-02 DIAGNOSIS — M791 Myalgia, unspecified site: Secondary | ICD-10-CM

## 2020-02-02 DIAGNOSIS — E785 Hyperlipidemia, unspecified: Secondary | ICD-10-CM

## 2020-02-02 DIAGNOSIS — T466X5A Adverse effect of antihyperlipidemic and antiarteriosclerotic drugs, initial encounter: Secondary | ICD-10-CM | POA: Diagnosis not present

## 2020-02-02 NOTE — Progress Notes (Addendum)
LIPID CLINIC CONSULT NOTE  Chief Complaint:  Manage dyslipidemia, statin intolerance  Primary Care Physician: Gaynelle Arabian, MD  Primary Cardiologist:  No primary care provider on file.  HPI:  [name] [name] is a 69 y.o. male who is being seen today for the evaluation of dyslipidemia, statin intolerance at the request of Gaynelle Arabian, MD.  This is a pleasant 69 year old male kindly referred for management of dyslipidemia with a history of statin intolerance (this included atorvastatin, ezetimibe, pravastatin, fenofibrate and pitavastatin).  He reports longstanding dyslipidemia and has been tried on both multiple statins, ezetimibe and fenofibrate in the past causing myalgias which were intolerable.  More recently had lipids off of medication and total cholesterol was 267, triglycerides 166, HDL 53 and LDL 183.  Non-HDL cholesterol 214.  There is a strong family history of heart disease. He related that all 5 siblings (3 girls, 2 boys) take cholesterol medicine and both brothers had heart attacks. Father's cholesterol was very high, difficult to keep below 300 (per patient report).   PMHx:  Past Medical History:  Diagnosis Date  . Arthritis   . Carpal tunnel syndrome    Right  . Cataracts, bilateral    very mild  . Headache 2011   HX OF VISUAL MIGRAINES - NO PAIN --MIGRAINES ATTRIBUTED TO PT CUTTING NICOTINE PATCH IN HALF BEFORE APPLYING TO HIS SKIN.    Marland Kitchen Hepatitis 2001   C -treated and no problems since  2001 'cured'  . Muscle cramps    hands and legs  . Osteopenia   . Pain    KNEE OA  . Shortness of breath dyspnea    "SOMETIMES FEELS LIKE I CAN'T CATCH A DEEP BREATH "- ON GOING FOR YEARS - [place] WORK UP 2011 - [place]  . Stroke ([place])    HX POSSIBLE TIA - RT LEG NUMBNESS AND TINGLING FINGERS AND NUMBNESS RT FACE - ALL RESOLVED ; STATES NEUROLOGIST COULD NOT FIND ANY EVIDENCE OF STROKE - THOUGHT PT'S EPISODE MIGHT BE FROM  CUTTING NICOTINE PATCH IN HALF AND APPLYING TO SKIN  . Trigger finger    LEFT MIDDLE FINGER    Past Surgical History:  Procedure Laterality Date  . COLONOSCOPY    . left ankle surgery     fractured  . left knee surgery     MENISCAL REMOVED  . left shoulder     torn ligaments left shoulder  . LUMBAR FUSION     L3 L4 L5  . RIGHT CARPAL TUNNEL RELEASE  2011  . RIGHT KNEE ARTHROSCOPY   AUG OR SEPT 2015   AT [facility]  . ROOT CANAL    . TONSILLECTOMY     as child  . 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 TOTAL KNEE ARTHROPLASTY Right 07/21/2014   Procedure: RIGHT TOTAL KNEE ARTHROPLASTY;  Surgeon: Mauri Pole, MD;  Location: [facility];  Service: Orthopedics;  Laterality: Right;  . TOTAL SHOULDER ARTHROPLASTY Right 10/17/2019   Procedure: RIGHT REVERSE TOTAL SHOULDER REPLACEMENT;  Surgeon: Netta Cedars, MD;  Location: [facility];  Service: Orthopedics;  Laterality: Right;  interscalene block  . TRIGGER FINGER RELEASE Left     FAMHx:  Family History  Problem Relation Age of Onset  . Alcohol abuse Father   . Hyperlipidemia Father   .  Heart attack Brother 105  . Hyperlipidemia Brother   . Hyperlipidemia Sister        3 sisters  . Heart attack Brother   . Hyperlipidemia Brother     SOCHx:   reports that he quit smoking about 7 years ago. He has never used smokeless tobacco. He reports current alcohol use of about 7.0 standard drinks of alcohol per week. He reports previous drug use. Drug: Marijuana.  ALLERGIES:  Allergies  Allergen Reactions  . Statins Other (See Comments)    MYALGIAS    ROS: Pertinent items noted in HPI and remainder of comprehensive ROS otherwise negative.  HOME MEDS: Current Outpatient Medications on File Prior to Visit  Medication Sig Dispense Refill  . clindamycin (CLEOCIN) 150 MG capsule TAKE 2  CAPSULES 1 HOUR PRIOR TO DENTAL APPOINTMENT. TAKE 1 CAPSULE 6 HOURS AFTER APPOINTMENT.    Marland Kitchen gentamicin cream (GARAMYCIN) 0.1 % Apply 1 application topically 2 (two) times daily. 15 g 1  . ibuprofen (ADVIL,MOTRIN) 200 MG tablet Take 400-600 mg by mouth every 4 (four) hours as needed for headache or mild pain (for pain.).     . Magnesium 500 MG TABS Take 500 mg by mouth 2 (two) times daily.     No current facility-administered medications on file prior to visit.    LABS/IMAGING: No results found for this or any previous visit (from the past 48 hour(s)). No results found.  LIPID PANEL:    Component Value Date/Time   CHOL  10/31/2007 0830    173        ATP III CLASSIFICATION:  <200     mg/dL   Desirable  200-239  mg/dL   Borderline High  >=240    mg/dL   High   TRIG 129 10/31/2007 0830   HDL 31 (L) 10/31/2007 0830   CHOLHDL 5.6 10/31/2007 0830   VLDL 26 10/31/2007 0830   LDLCALC (H) 10/31/2007 0830    116        Total Cholesterol/HDL:CHD Risk Coronary Heart Disease Risk Table                     Men   Women  1/2 Average Risk   3.4   3.3    WEIGHTS: Wt Readings from Last 3 Encounters:  02/02/20 226 lb 3.2 oz (102.6 kg)  10/17/19 220 lb 12.8 oz (100.2 kg)  10/14/19 220 lb 12.8 oz (100.2 kg)    VITALS: BP 119/79   Pulse 95   Temp (!) 97 F (36.1 C)   Ht 6\' 1"  (1.854 m)   Wt 226 lb 3.2 oz (102.6 kg)   SpO2 92%   BMI 29.84 kg/m   EXAM: General appearance: alert and no distress Neck: no carotid bruit, no JVD and thyroid not enlarged, symmetric, no tenderness/mass/nodules Lungs: clear to auscultation bilaterally Heart: regular rate and rhythm, S1, S2 normal, no murmur, click, rub or gallop Abdomen: soft, non-tender; bowel sounds normal; no masses,  no organomegaly Extremities: extremities normal, atraumatic, no cyanosis or edema and No tendon xanthomas, no corneal arcus Pulses: 2+ and symmetric Skin: Skin color, texture, turgor normal. No rashes or lesions Neurologic:  Grossly normal Psych: Pleasant  EKG: Deferred  ASSESSMENT: 1. Mixed dyslipidemia with high LDL cholesterol 2. 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 Strong family history of high cholesterol 4. Statin intolerance, Zetia and fenofibrate intolerance-myalgias  PLAN: 1.   [name] [name] has a mixed dyslipidemia with high LDL cholesterol, possibly familial with 2  brothers who have high cholesterol and have had heart attacks.  He has no known coronary disease.  He underwent stress testing and an echocardiogram by Dr. Radford Pax with [facility] cardiology in 2012, both of which were unremarkable except for some diastolic dysfunction.  I would like to get a calcium score to further risk stratify him and he is a likely candidate for PCSK9 inhibitor given multiple medication intolerances and persistently elevated cholesterol.  There may also be a dietary component.  He reports moderate intake of saturated fats including some fried foods and intends to continue to work on that.  Plan follow-up with me and repeat lipids in 3 to 4 months.  Thanks again for the kind referral.  [name] Casino, MD, FACC, [place] Director of the [facility]  Cardiovascular Risk Reduction Clinic Diplomate of the American Board of Clinical Lipidology Attending Cardiologist  Direct Dial: [phone]  Fax: [phone]  Website:  [email or web address]  [name] [name] [name] [name] 02/04/2020, 8:11 AM

## 2020-02-02 NOTE — Patient Instructions (Addendum)
Dr. Debara Pickett recommends REPATHA (PCSK9). This is an injectable cholesterol medication self-administered once every 14 days. This medication will need prior approval with your insurance company, which we will work on. If the medication is not approved initially, we may need to do an appeal with your insurance. We will keep you updated on this process.   Administer medication in area of fatty tissue such as abdomen, outer thigh, back up of arm - and rotate site with each injection Store medication in refrigerator until ready to administer - allow to sit at room temp for 30 mins - 1 hour prior to injection Dispose of medication in a SHARPS container - your pharmacy should be able to direct you on this and proper disposal   If you need co-pay assistance, please look into the program at healthwellfoundation.org >> disease funds >> hypercholesterolemia. This is an online application or you can call to complete.   If you need a co-pay card for Repatha: [email or web address]-moyer.[email or web address] >> paying for Repatha  WILL NOTIFY YOU ONCE INSURANCE HAS APPROVED   Dr. Debara Pickett recommends that you schedule a follow up visit with him the in the [facility] in 4 MONTHS  months. Please have fasting blood work about 1 week prior to this visit and he will review the blood work results with you at your appointment.    CALCIUM SCORE, THIS WILL COST $150 OUT OF POCKET [facility] AT [place] [address]

## 2020-02-04 ENCOUNTER — Encounter: Payer: Self-pay | Admitting: Internal Medicine

## 2020-02-06 ENCOUNTER — Other Ambulatory Visit: Payer: Self-pay

## 2020-02-06 ENCOUNTER — Telehealth: Payer: Self-pay | Admitting: Cardiovascular Disease

## 2020-02-06 ENCOUNTER — Ambulatory Visit (INDEPENDENT_AMBULATORY_CARE_PROVIDER_SITE_OTHER)
Admission: RE | Admit: 2020-02-06 | Discharge: 2020-02-06 | Disposition: A | Discharge status: Home / Self Care | Payer: Self-pay | Source: Ambulatory Visit | Attending: Internal Medicine | Admitting: Internal Medicine

## 2020-02-06 ENCOUNTER — Telehealth: Payer: Self-pay

## 2020-02-06 DIAGNOSIS — R918 Other nonspecific abnormal finding of lung field: Secondary | ICD-10-CM

## 2020-02-06 DIAGNOSIS — E785 Hyperlipidemia, unspecified: Secondary | ICD-10-CM

## 2020-02-06 NOTE — Addendum Note (Signed)
Addended by: Collene Gobble on: 02/06/2020 05:13 PM   Modules accepted: Orders

## 2020-02-06 NOTE — Telephone Encounter (Signed)
Patient has CT test on 6/3 and OV with Dr. Lamonte Sakai on 6/9

## 2020-02-06 NOTE — Telephone Encounter (Signed)
New message   Transferred a critical report w/critical values to [facility] in Triage.

## 2020-02-06 NOTE — Telephone Encounter (Signed)
Patient has been scheduled per RB. Ria Comment called the pt and went over all of the information about his Super D appointment and his appointment with Dr. Lamonte Sakai. Nothing further was needed at this time.

## 2020-02-06 NOTE — Telephone Encounter (Signed)
Spoke with Malachy Mood from [facility] radiology. 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 PET CT strongly recommended in the near future."  Dr. Debara Pickett contacted and message being sent to MD for review.

## 2020-02-06 NOTE — Telephone Encounter (Signed)
Dr. Valeta Harms is out of the office until 6/9. Neither Dr. Lamonte Sakai or Dr. Tamala Julian have any openings next week. There are no 62min avail slot with either of these three providers until 6/23 with Dr. Valeta Harms.  Dr. Lamonte Sakai or Dr. Tamala Julian, please advise if we could schedule pt in a 32min slot for a consult or if pt might be able to be double booked as [facility] stated pt needs to get in for an urgent consult due to lung mass and they are trying to see if pt could be seen next week.

## 2020-02-06 NOTE — Telephone Encounter (Signed)
Best time for me to do his procedure is any day at [facility] 6/14 thru 6/18.  If we can get his SuperD done quickly I could see him in office the week before 6/7 thru 6/11, 15 minute slot ok. I may have nodule slots that week

## 2020-02-06 NOTE — Telephone Encounter (Signed)
Thanks

## 2020-02-06 NOTE — Telephone Encounter (Addendum)
Urgent referral to [facility] ordered Called pulmonary office to notify them on STAT referral  They had to schedule for first available and then send message to provider(s) to review acuity and nurse will contact patient with sooner appointment

## 2020-02-06 NOTE — Telephone Encounter (Signed)
Great im covering his inbox :)

## 2020-02-06 NOTE — Telephone Encounter (Signed)
Even though Dr. Valeta Harms is out of the office, I am going to go ahead and route this to him as well so he can see this message when he returns.

## 2020-02-06 NOTE — Telephone Encounter (Signed)
Looks like a straightforward nav case, I am placing super D CT, once it is done let's have [facility] or [facility] decide if they are willing to see patient as consult the day of procedure.  Thanks, Linna Hoff

## 2020-02-06 NOTE — Addendum Note (Signed)
Addended by: Fidel Levy on: 02/06/2020 03:13 PM   Modules accepted: Orders

## 2020-02-10 ENCOUNTER — Telehealth: Payer: Self-pay | Admitting: Emergency Medicine

## 2020-02-10 ENCOUNTER — Telehealth: Payer: Self-pay | Admitting: Internal Medicine

## 2020-02-10 DIAGNOSIS — R918 Other nonspecific abnormal finding of lung field: Secondary | ICD-10-CM

## 2020-02-10 DIAGNOSIS — Z5181 Encounter for therapeutic drug level monitoring: Secondary | ICD-10-CM

## 2020-02-10 DIAGNOSIS — Z01810 Encounter for preprocedural cardiovascular examination: Secondary | ICD-10-CM

## 2020-02-10 DIAGNOSIS — I2510 Atherosclerotic heart disease of native coronary artery without angina pectoris: Secondary | ICD-10-CM

## 2020-02-10 NOTE — Telephone Encounter (Signed)
-----   Message from Fidel Levy, RN sent at 02/06/2020  1:47 PM EDT ----- Regarding: RE: PCSK9 ID: [id number] BIN: [id number] PCN: [id number] RxGrp: [id number]  [facility]  ----- Message ----- From: Fidel Levy, RN Sent: 02/05/2020   9:02 AM EDT To: Fidel Levy, RN Subject: PCSK9

## 2020-02-10 NOTE — Telephone Encounter (Signed)
PA for praluent 150mg /mL submitted via CMM Key: [id number] - PA Case ID: [id number]  Medication approved from 09/12/2019 - 09/10/2020

## 2020-02-10 NOTE — Telephone Encounter (Signed)
Called spoke to patient.  Let him know he had to be 2.5 hours early before his procedure.  He wanted to know how long he would be in recovery I told him depends each patient is different.  He asked about anesthesia, told him he would be put completely out. He states he would come to our office to have labs drawn next week.  Nothing further needed at this time.  Lab orders placed

## 2020-02-12 ENCOUNTER — Ambulatory Visit (HOSPITAL_COMMUNITY)
Admission: RE | Admit: 2020-02-12 | Discharge: 2020-02-12 | Disposition: A | Discharge status: Home / Self Care | Payer: Medicare Other | Source: Ambulatory Visit | Attending: Internal Medicine | Admitting: Internal Medicine

## 2020-02-12 ENCOUNTER — Other Ambulatory Visit: Payer: Self-pay

## 2020-02-12 DIAGNOSIS — R918 Other nonspecific abnormal finding of lung field: Secondary | ICD-10-CM | POA: Insufficient documentation

## 2020-02-17 ENCOUNTER — Other Ambulatory Visit: Payer: Medicare Other

## 2020-02-17 DIAGNOSIS — Z5181 Encounter for therapeutic drug level monitoring: Secondary | ICD-10-CM

## 2020-02-17 DIAGNOSIS — R918 Other nonspecific abnormal finding of lung field: Secondary | ICD-10-CM

## 2020-02-17 LAB — PROTIME-INR
INR: 1.1 ratio — ABNORMAL HIGH (ref 0.8–1.0)
Prothrombin Time: 12.4 s (ref 9.6–13.1)

## 2020-02-17 LAB — APTT: aPTT: 30.9 s (ref 23.4–32.7)

## 2020-02-17 LAB — COMPREHENSIVE METABOLIC PANEL
ALT: 27 U/L (ref 0–53)
AST: 21 U/L (ref 0–37)
Albumin: 4.3 g/dL (ref 3.5–5.2)
Alkaline Phosphatase: 71 U/L (ref 39–117)
BUN: 25 mg/dL — ABNORMAL HIGH (ref 6–23)
CO2: 27 mEq/L (ref 19–32)
Calcium: 9.3 mg/dL (ref 8.4–10.5)
Chloride: 101 mEq/L (ref 96–112)
Creatinine, Ser: 1.01 mg/dL (ref 0.40–1.50)
GFR: 73.75 mL/min (ref >60.00)
Glucose, Bld: 213 mg/dL — ABNORMAL HIGH (ref 70–99)
Potassium: 4.1 mEq/L (ref 3.5–5.1)
Sodium: 133 mEq/L — ABNORMAL LOW (ref 135–145)
Total Bilirubin: 1.1 mg/dL (ref 0.2–1.2)
Total Protein: 6.9 g/dL (ref 6.0–8.3)

## 2020-02-17 LAB — CBC WITH DIFFERENTIAL/PLATELET
Basophils Absolute: 0 10*3/uL (ref 0.0–0.1)
Basophils Relative: 0.5 % (ref 0.0–3.0)
Eosinophils Absolute: 0.2 10*3/uL (ref 0.0–0.7)
Eosinophils Relative: 2.4 % (ref 0.0–5.0)
HCT: 43.9 % (ref 39.0–52.0)
Hemoglobin: 15.1 g/dL (ref 13.0–17.0)
Lymphocytes Relative: 17.9 % (ref 12.0–46.0)
Lymphs Abs: 1.1 10*3/uL (ref 0.7–4.0)
MCHC: 34.5 g/dL (ref 30.0–36.0)
MCV: 95.8 fl (ref 78.0–100.0)
Monocytes Absolute: 0.4 10*3/uL (ref 0.1–1.0)
Monocytes Relative: 6.5 % (ref 3.0–12.0)
Neutro Abs: 4.6 10*3/uL (ref 1.4–7.7)
Neutrophils Relative %: 72.7 % (ref 43.0–77.0)
Platelets: 193 10*3/uL (ref 150.0–400.0)
RBC: 4.58 Mil/uL (ref 4.22–5.81)
RDW: 12.6 % (ref 11.5–15.5)
WBC: 6.3 10*3/uL (ref 4.0–10.5)

## 2020-02-18 ENCOUNTER — Other Ambulatory Visit: Payer: Self-pay

## 2020-02-18 ENCOUNTER — Ambulatory Visit (INDEPENDENT_AMBULATORY_CARE_PROVIDER_SITE_OTHER): Payer: Medicare Other | Admitting: Emergency Medicine

## 2020-02-18 ENCOUNTER — Encounter: Payer: Self-pay | Admitting: Emergency Medicine

## 2020-02-18 DIAGNOSIS — R918 Other nonspecific abnormal finding of lung field: Secondary | ICD-10-CM

## 2020-02-18 NOTE — Progress Notes (Signed)
Subjective:    Patient ID: [name] [name], male    DOB: [birth year], 69 y.o.   MRN: [id number]  HPI 69 year old former smoker (30 pack years) with a history of osteoarthritis, migraine headaches, TIA, hyperlipidemia.  Has been seen in [facility] cardiology office for his lipid control.  He underwent a cardiac CT for calcium scoring and restratification on 02/06/2020.  This CT showed spurious observation of a macrolobulated spiculated 2.3 x 2 x 5.7 cm right lower lobe mass with a 3 mm subpleural right middle lobe nodule.  This prompted a dedicated CT chest done on 02/13/2020 which I have reviewed.  This confirmed the presence of a bilobed spiculated 4.7 x 3.5 x 2.7 cm right lower lobe mass, also confirmed the 6 mm right middle lobe nodule.  He is here to discuss the CT and for evaluation before we proceed with bronchoscopy and tissue diagnosis.   Review of Systems As per HPI  Past Medical History:  Diagnosis Date  . Arthritis   . Carpal tunnel syndrome    Right  . Cataracts, bilateral    very mild  . Headache 2011   HX OF VISUAL MIGRAINES - NO PAIN --MIGRAINES ATTRIBUTED TO PT CUTTING NICOTINE PATCH IN HALF BEFORE APPLYING TO HIS SKIN.    Marland Kitchen Hepatitis 2001   C -treated and no problems since  2001 'cured'  . Muscle cramps    hands and legs  . Osteopenia   . Pain    KNEE OA  . Shortness of breath dyspnea    "SOMETIMES FEELS LIKE I CAN'T CATCH A DEEP BREATH "- ON GOING FOR YEARS - [place] WORK UP 2011 - [place]  . Stroke ([place])    HX POSSIBLE TIA - RT LEG NUMBNESS AND TINGLING FINGERS AND NUMBNESS RT FACE - ALL RESOLVED ; STATES NEUROLOGIST COULD NOT FIND ANY EVIDENCE OF STROKE - THOUGHT PT'S EPISODE MIGHT BE FROM CUTTING NICOTINE PATCH IN HALF AND APPLYING TO SKIN  . Trigger finger    LEFT MIDDLE FINGER     Family History  Problem Relation Age of Onset  . Alcohol abuse Father   . Hyperlipidemia Father   . Heart attack Brother 55  .  Hyperlipidemia Brother   . Hyperlipidemia Sister        3 sisters  . Heart attack Brother   . Hyperlipidemia Brother      Social History   Socioeconomic History  . Marital status: Married    Spouse name: Not on file  . Number of children: Not on file  . Years of education: Not on file  . Highest education level: Not on file  Occupational History  . Not on file  Tobacco Use  . Smoking status: Former Smoker    Quit date: 04/30/2012    Years since quitting: 7.8  . Smokeless tobacco: Never Used  Substance and Sexual Activity  . Alcohol use: Yes    Alcohol/week: 7.0 standard drinks    Types: 7 Shots of liquor per week    Comment: occassionally  PT WEARS NICOTINE PATCH  . Drug use: Not Currently    Types: Marijuana    Comment: ocassionally, last time smoked 07/2017  . Sexual activity: Not on file  Other Topics Concern  . Not on file  Social History Narrative  . Not on file   Social Determinants of Health   Financial Resource Strain:   . Difficulty of Paying Living Expenses:  Food Insecurity:   . Worried About Charity fundraiser in the Last Year:   . Arboriculturist in the Last Year:   Transportation Needs:   . Film/video editor (Medical):   Marland Kitchen Lack of Transportation (Non-Medical):   Physical Activity:   . Days of Exercise per Week:   . Minutes of Exercise per Session:   Stress:   . Feeling of Stress :   Social Connections:   . Frequency of Communication with Friends and Family:   . Frequency of Social Gatherings with Friends and Family:   . Attends Religious Services:   . Active Member of Clubs or Organizations:   . Attends Archivist Meetings:   Marland Kitchen Marital Status:   Intimate Partner Violence:   . Fear of Current or Ex-Partner:   . Emotionally Abused:   Marland Kitchen Physically Abused:   . 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  Very remote huffing exposure.    Allergies  Allergen Reactions  . Statins  Other (See Comments)    MYALGIAS     Outpatient Medications Prior to Visit  Medication Sig Dispense Refill  . clindamycin (CLEOCIN) 150 MG capsule Take 150-300 mg by mouth See admin instructions. 300 mg 1 hour before Dental appt, 150 mg 6 hours after appt    . cyclobenzaprine (FLEXERIL) 10 MG tablet Take 10 mg by mouth 3 (three) times daily as needed for muscle spasms.    Marland Kitchen gentamicin cream (GARAMYCIN) 0.1 % Apply 1 application topically 2 (two) times daily. (Patient taking differently: Apply 1 application topically 2 (two) times daily as needed (irritation). ) 15 g 1  . ibuprofen (ADVIL,MOTRIN) 200 MG tablet Take 400-600 mg by mouth every 4 (four) hours as needed for headache or mild pain (for pain.).     . Magnesium 500 MG TABS Take 500 mg by mouth 2 (two) times daily.     No facility-administered medications prior to visit.        Objective:   Physical Exam Vitals:   02/18/20 1628  BP: 120/60  Pulse: 91  Temp: 97.9 F (36.6 C)  TempSrc: Oral  SpO2: 96%  Weight: 222 lb 3.2 oz (100.8 kg)  Height: 6\' 1"  (1.854 m)   Gen: Pleasant, well-nourished, in no distress,  normal affect  ENT: No lesions,  mouth clear,  oropharynx clear, no postnasal drip  Neck: No JVD, no stridor  Lungs: No use of accessory muscles, no crackles or wheezing on normal respiration, no wheeze on forced expiration  Cardiovascular: RRR, heart sounds normal, no murmur or gallops, no peripheral edema  Musculoskeletal: No deformities, no cyanosis or clubbing  Neuro: alert, awake, non focal  Skin: Warm, no lesions or rash      Assessment & Plan:  Right lower lobe lung mass Right lower lobe mass consistent with primary lung cancer.  He needs a tissue diagnosis.  Will proceed with navigational bronchoscopy next week.  I will asked Dr. Debara Pickett if there are any concerns from a cardiac standpoint based on his cardiac CT.  I will also arrange for pulmonary function testing to begin the assessment for possible  candidacy for lobectomy.  He will also need a PET scan assuming the biopsies are consistent with malignancy.  All questions answered.  Discussed risks and benefits.  Baltazar Apo, MD, PhD 02/18/2020, 5:22 PM [facility] [phone] or if no answer [phone]

## 2020-02-18 NOTE — Assessment & Plan Note (Signed)
Right lower lobe mass consistent with primary lung cancer.  He needs a tissue diagnosis.  Will proceed with navigational bronchoscopy next week.  I will asked Dr. Debara Pickett if there are any concerns from a cardiac standpoint based on his cardiac CT.  I will also arrange for pulmonary function testing to begin the assessment for possible candidacy for lobectomy.  He will also need a PET scan assuming the biopsies are consistent with malignancy.  All questions answered.  Discussed risks and benefits.

## 2020-02-18 NOTE — Patient Instructions (Signed)
We will arrange for pulmonary function testing We will proceed with your navigational bronchoscopy on 6/15 as planned.  Dr. Lamonte Sakai will review your calcium score with Dr. Debara Pickett to ensure no contraindications to general anesthesia or the procedure. Follow with Dr Lamonte Sakai in 1 month

## 2020-02-18 NOTE — H&P (View-Only) (Signed)
Subjective:    Patient ID: [name] [name], male    DOB: [birth year], 69 y.o.   MRN: [id number]  HPI 69 year old former smoker (30 pack years) with a history of osteoarthritis, migraine headaches, TIA, hyperlipidemia.  Has been seen in [facility] cardiology office for his lipid control.  He underwent a cardiac CT for calcium scoring and restratification on 02/06/2020.  This CT showed spurious observation of a macrolobulated spiculated 2.3 x 2 x 5.7 cm right lower lobe mass with a 3 mm subpleural right middle lobe nodule.  This prompted a dedicated CT chest done on 02/13/2020 which I have reviewed.  This confirmed the presence of a bilobed spiculated 4.7 x 3.5 x 2.7 cm right lower lobe mass, also confirmed the 6 mm right middle lobe nodule.  He is here to discuss the CT and for evaluation before we proceed with bronchoscopy and tissue diagnosis.   Review of Systems As per HPI  Past Medical History:  Diagnosis Date  . Arthritis   . Carpal tunnel syndrome    Right  . Cataracts, bilateral    very mild  . Headache 2011   HX OF VISUAL MIGRAINES - NO PAIN --MIGRAINES ATTRIBUTED TO PT CUTTING NICOTINE PATCH IN HALF BEFORE APPLYING TO HIS SKIN.    Marland Kitchen Hepatitis 2001   C -treated and no problems since  2001 'cured'  . Muscle cramps    hands and legs  . Osteopenia   . Pain    KNEE OA  . Shortness of breath dyspnea    "SOMETIMES FEELS LIKE I CAN'T CATCH A DEEP BREATH "- ON GOING FOR YEARS - [employer] WORK UP 2011 - [place]  . Stroke ([place])    HX POSSIBLE TIA - RT LEG NUMBNESS AND TINGLING FINGERS AND NUMBNESS RT FACE - ALL RESOLVED ; STATES NEUROLOGIST COULD NOT FIND ANY EVIDENCE OF STROKE - THOUGHT PT'S EPISODE MIGHT BE FROM CUTTING NICOTINE PATCH IN HALF AND APPLYING TO SKIN  . Trigger finger    LEFT MIDDLE FINGER     Family History  Problem Relation Age of Onset  . Alcohol abuse Father   . Hyperlipidemia Father   . Heart attack Brother 36  .  Hyperlipidemia Brother   . Hyperlipidemia Sister        3 sisters  . Heart attack Brother   . Hyperlipidemia Brother      Social History   Socioeconomic History  . Marital status: Married    Spouse name: Not on file  . Number of children: Not on file  . Years of education: Not on file  . Highest education level: Not on file  Occupational History  . Not on file  Tobacco Use  . Smoking status: Former Smoker    Quit date: 04/30/2012    Years since quitting: 7.8  . Smokeless tobacco: Never Used  Substance and Sexual Activity  . Alcohol use: Yes    Alcohol/week: 7.0 standard drinks    Types: 7 Shots of liquor per week    Comment: occassionally  PT WEARS NICOTINE PATCH  . Drug use: Not Currently    Types: Marijuana    Comment: ocassionally, last time smoked 07/2017  . Sexual activity: Not on file  Other Topics Concern  . Not on file  Social History Narrative  . Not on file   Social Determinants of Health   Financial Resource Strain:   . Difficulty of Paying Living Expenses:  Food Insecurity:   . Worried About Charity fundraiser in the Last Year:   . Arboriculturist in the Last Year:   Transportation Needs:   . Film/video editor (Medical):   Marland Kitchen Lack of Transportation (Non-Medical):   Physical Activity:   . Days of Exercise per Week:   . Minutes of Exercise per Session:   Stress:   . Feeling of Stress :   Social Connections:   . Frequency of Communication with Friends and Family:   . Frequency of Social Gatherings with Friends and Family:   . Attends Religious Services:   . Active Member of Clubs or Organizations:   . Attends Archivist Meetings:   Marland Kitchen Marital Status:   Intimate Partner Violence:   . Fear of Current or Ex-Partner:   . Emotionally Abused:   Marland Kitchen Physically Abused:   . 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  Very remote huffing exposure.    Allergies  Allergen Reactions  . Statins  Other (See Comments)    MYALGIAS     Outpatient Medications Prior to Visit  Medication Sig Dispense Refill  . clindamycin (CLEOCIN) 150 MG capsule Take 150-300 mg by mouth See admin instructions. 300 mg 1 hour before Dental appt, 150 mg 6 hours after appt    . cyclobenzaprine (FLEXERIL) 10 MG tablet Take 10 mg by mouth 3 (three) times daily as needed for muscle spasms.    Marland Kitchen gentamicin cream (GARAMYCIN) 0.1 % Apply 1 application topically 2 (two) times daily. (Patient taking differently: Apply 1 application topically 2 (two) times daily as needed (irritation). ) 15 g 1  . ibuprofen (ADVIL,MOTRIN) 200 MG tablet Take 400-600 mg by mouth every 4 (four) hours as needed for headache or mild pain (for pain.).     . Magnesium 500 MG TABS Take 500 mg by mouth 2 (two) times daily.     No facility-administered medications prior to visit.        Objective:   Physical Exam Vitals:   02/18/20 1628  BP: 120/60  Pulse: 91  Temp: 97.9 F (36.6 C)  TempSrc: Oral  SpO2: 96%  Weight: 222 lb 3.2 oz (100.8 kg)  Height: 6\' 1"  (1.854 m)   Gen: Pleasant, well-nourished, in no distress,  normal affect  ENT: No lesions,  mouth clear,  oropharynx clear, no postnasal drip  Neck: No JVD, no stridor  Lungs: No use of accessory muscles, no crackles or wheezing on normal respiration, no wheeze on forced expiration  Cardiovascular: RRR, heart sounds normal, no murmur or gallops, no peripheral edema  Musculoskeletal: No deformities, no cyanosis or clubbing  Neuro: alert, awake, non focal  Skin: Warm, no lesions or rash      Assessment & Plan:  Right lower lobe lung mass Right lower lobe mass consistent with primary lung cancer.  He needs a tissue diagnosis.  Will proceed with navigational bronchoscopy next week.  I will asked Dr. Debara Pickett if there are any concerns from a cardiac standpoint based on his cardiac CT.  I will also arrange for pulmonary function testing to begin the assessment for possible  candidacy for lobectomy.  He will also need a PET scan assuming the biopsies are consistent with malignancy.  All questions answered.  Discussed risks and benefits.  Baltazar Apo, MD, PhD 02/18/2020, 5:22 PM [facility] [phone] or if no answer [phone]

## 2020-02-19 ENCOUNTER — Telehealth (HOSPITAL_COMMUNITY): Payer: Self-pay | Admitting: *Deleted

## 2020-02-19 NOTE — Addendum Note (Signed)
Addended by: Fidel Levy on: 02/19/2020 09:55 AM   Modules accepted: Orders

## 2020-02-19 NOTE — Telephone Encounter (Signed)
Patient given detailed instructions per Myocardial Perfusion Study Information Sheet for the test on 02/19/20. Patient notified to arrive 15 minutes early and that it is imperative to arrive on time for appointment to keep from having the test rescheduled.  If you need to cancel or reschedule your appointment, please call the office within 24 hours of your appointment. . Patient verbalized understanding. Kirstie Peri

## 2020-02-19 NOTE — Telephone Encounter (Addendum)
Dr. Lamonte Sakai suggested cardiac work up given calcium score. Lexiscan Myoview ordered per verbal from Dr. Debara Pickett.   Patient scheduled for 02/20/2020 lexiscan @ 0715 w/0645 arrival. NPO 3 hours prior. No caffeine/decaff bev/chocolate 12 hours prior. No aftershave.   Patient notified via [place]

## 2020-02-20 ENCOUNTER — Other Ambulatory Visit: Payer: Self-pay

## 2020-02-20 ENCOUNTER — Ambulatory Visit (HOSPITAL_COMMUNITY): Payer: Medicare Other | Attending: Internal Medicine

## 2020-02-20 DIAGNOSIS — Z01810 Encounter for preprocedural cardiovascular examination: Secondary | ICD-10-CM

## 2020-02-20 DIAGNOSIS — I2510 Atherosclerotic heart disease of native coronary artery without angina pectoris: Secondary | ICD-10-CM

## 2020-02-20 DIAGNOSIS — I2584 Coronary atherosclerosis due to calcified coronary lesion: Secondary | ICD-10-CM | POA: Diagnosis present

## 2020-02-20 LAB — MYOCARDIAL PERFUSION IMAGING
LV dias vol: 137 mL (ref 62–150)
LV sys vol: 71 mL
Peak HR: 90 {beats}/min
Rest HR: 69 {beats}/min
SDS: 4
SRS: 0
SSS: 4
TID: 1.04

## 2020-02-20 MED ORDER — REGADENOSON 0.4 MG/5ML IV SOLN
0.4000 mg | Freq: Once | INTRAVENOUS | Status: AC
  Administered 2020-02-20: 0.4 mg via INTRAVENOUS

## 2020-02-20 MED ORDER — TECHNETIUM TC 99M TETROFOSMIN IV KIT
11.0000 | PACK | Freq: Once | INTRAVENOUS | Status: AC | PRN
  Administered 2020-02-20: 11 via INTRAVENOUS
  Filled 2020-02-20: qty 11

## 2020-02-20 MED ORDER — TECHNETIUM TC 99M TETROFOSMIN IV KIT
31.8000 | PACK | Freq: Once | INTRAVENOUS | Status: AC | PRN
  Administered 2020-02-20: 31.8 via INTRAVENOUS
  Filled 2020-02-20: qty 32

## 2020-02-21 ENCOUNTER — Other Ambulatory Visit (HOSPITAL_COMMUNITY)
Admission: RE | Admit: 2020-02-21 | Discharge: 2020-02-21 | Disposition: A | Discharge status: Home / Self Care | Payer: Medicare Other | Source: Ambulatory Visit | Attending: Emergency Medicine | Admitting: Emergency Medicine

## 2020-02-21 DIAGNOSIS — Z01812 Encounter for preprocedural laboratory examination: Secondary | ICD-10-CM | POA: Insufficient documentation

## 2020-02-21 DIAGNOSIS — Z20822 Contact with and (suspected) exposure to covid-19: Secondary | ICD-10-CM | POA: Insufficient documentation

## 2020-02-21 LAB — SARS CORONAVIRUS 2 (TAT 6-24 HRS): SARS Coronavirus 2: NEGATIVE

## 2020-02-23 ENCOUNTER — Encounter (HOSPITAL_COMMUNITY): Payer: Self-pay | Admitting: Emergency Medicine

## 2020-02-23 ENCOUNTER — Other Ambulatory Visit: Payer: Self-pay

## 2020-02-23 NOTE — Anesthesia Preprocedure Evaluation (Addendum)
Anesthesia Evaluation  Patient identified by MRN, date of birth, ID band Patient awake    Reviewed: Allergy & Precautions, NPO status , Patient's Chart, lab work & pertinent test results  History of Anesthesia Complications Negative for: history of anesthetic complications  Airway Mallampati: III  TM Distance: >3 FB Neck ROM: Full    Dental  (+) Teeth Intact   Pulmonary former smoker,  RUL lung mass   Pulmonary exam normal        Cardiovascular negative cardio ROS Normal cardiovascular exam     Neuro/Psych negative neurological ROS  negative psych ROS   GI/Hepatic negative GI ROS, (+) Hepatitis - (treated), C  Endo/Other  negative endocrine ROS  Renal/GU negative Renal ROS  negative genitourinary   Musculoskeletal negative musculoskeletal ROS (+)   Abdominal   Peds  Hematology negative hematology ROS (+)   Anesthesia Other Findings  Patient was recently referred to cardiology by his PCP for lipid control and risk factor modification.  He had a cardiac CT for calcium scoring on 01/29/2020 which showed an incidental right lower lobe mass.  Follow-up dedicated CT chest 02/13/2020 confirmed presence of bilobed spiculated right lower lobe mass as well as a 6 mm right middle lobe nodule.  Cardiac CT also showed high calcium score, and nuclear stress test was ordered.  Stress test was done 02/20/2020 and Dr. Debara Pickett commented on result, "Possible small area of distal LAD ischemia - no angina. Would manage medically - cleared for procedures (endobronchial biopsy, etc)."  Nuclear stress test 02/20/20: Medium size, moderate intensity partially reversible apical/apical septal/apical anterior perfusion defect, suggestive of small area of ischemia, possibly in the distal LAD. No large areas of reversible ischemia. LVEF 48%, but visually looks higher with normal wall motion. This is an intermediate risk study due to LVEF <49%.   Reproductive/Obstetrics                           Anesthesia Physical Anesthesia Plan  ASA: II  Anesthesia Plan: General   Post-op Pain Management:    Induction: Intravenous  PONV Risk Score and Plan: 2 and Ondansetron, Dexamethasone, Treatment may vary due to age or medical condition and Midazolam  Airway Management Planned: Oral ETT  Additional Equipment: None  Intra-op Plan:   Post-operative Plan: Extubation in OR  Informed Consent: I have reviewed the patients History and Physical, chart, labs and discussed the procedure including the risks, benefits and alternatives for the proposed anesthesia with the patient or authorized representative who has indicated his/her understanding and acceptance.     Dental advisory given  Plan Discussed with:   Anesthesia Plan Comments: (PAT note by Karoline Caldwell, PA-C: Patient was recently referred to cardiology by his PCP for lipid control and risk factor modification.  He had a cardiac CT for calcium scoring on 01/29/2020 which showed an incidental right lower lobe mass.  Follow-up dedicated CT chest 02/13/2020 confirmed presence of bilobed spiculated right lower lobe mass as well as a 6 mm right middle lobe nodule.  Cardiac CT also showed high calcium score, and nuclear stress test was ordered.  Stress test was done 02/20/2020 and Dr. Debara Pickett commented on result, "Possible small area of distal LAD ischemia - no angina. Would manage medically - cleared for procedures (endobronchial biopsy, etc)."  History of hepatitis C status post treatment.  CMP and CBC from 02/17/2020 reviewed.  Glucose elevated at 213.  No diagnosis of diabetes listed in patient's chart.  Labs otherwise unremarkable.  Will need day of surgery eval.  CT chest 02/12/2020: IMPRESSION: 1. 4.7 x 3.5 x 2.7 cm spiculated, macrolobular somewhat bilobed mass lesion in the posterior right lower lobe with CT imaging features highly suspicious for primary  bronchogenic neoplasm. PET-CT recommended to further evaluate. 2. 6 mm subpleural right middle lobe pulmonary nodule. Likely benign. Attention on follow-up recommended. 3. Cholelithiasis. 4. Aortic Atherosclerosis (ICD10-I70.0) and Emphysema (ICD10-J43.9).  Nuclear stress 02/20/2020: Nuclear stress EF: 48%. No T wave inversion was noted during stress. There was no ST segment deviation noted during stress. Defect 1: There is a medium defect of moderate severity. This is an intermediate risk study. The left ventricular ejection fraction is mildly decreased (45-54%).   Medium size, moderate intensity partially reversible apical/apical septal/apical anterior perfusion defect, suggestive of small area of ischemia, possibly in the distal LAD. No large areas of reversible ischemia. LVEF 48%, but visually looks higher with normal wall motion. This is an intermediate risk study due to LVEF <49%.)       Anesthesia Quick Evaluation

## 2020-02-23 NOTE — Progress Notes (Signed)
Pt denies any acute pulmonary issues. Pt denies chest pain. Pt stated that he is under the care of Dr. Debara Pickett, Cardiology and Dr. Gaynelle Arabian, PCP. Pt denies having a cardiac cath. Pt denies having an EKG and chest x ray.  Pt denies recent labs.  Pt stated that he does not take Aspirin. Pt made aware to stop taking vitamins, fish oil and herbal medications. Do not take any NSAIDs ie: Ibuprofen, Advil, Naproxen (Aleve), Motrin, BC and Goody Powder. Pt reminded to continue to quarantine. Pt verbalized understanding of all pre-op instructions. PA, Anesthesia, asked to review pt history and clearance note.

## 2020-02-23 NOTE — Progress Notes (Signed)
Anesthesia Chart Review: Same-day work-up  Patient was recently referred to cardiology by his PCP for lipid control and risk factor modification.  He had a cardiac CT for calcium scoring on 01/29/2020 which showed an incidental right lower lobe mass.  Follow-up dedicated CT chest 02/13/2020 confirmed presence of bilobed spiculated right lower lobe mass as well as a 6 mm right middle lobe nodule.  Cardiac CT also showed high calcium score, and nuclear stress test was ordered.  Stress test was done 02/20/2020 and Dr. Debara Pickett commented on result, "Possible small area of distal LAD ischemia - no angina. Would manage medically - cleared for procedures (endobronchial biopsy, etc)."  History of hepatitis C status post treatment.  CMP and CBC from 02/17/2020 reviewed.  Glucose elevated at 213.  No diagnosis of diabetes listed in patient's chart.  Labs otherwise unremarkable.  Will need day of surgery eval.  CT chest 02/12/2020: IMPRESSION: 1. 4.7 x 3.5 x 2.7 cm spiculated, macrolobular somewhat bilobed mass lesion in the posterior right lower lobe with CT imaging features highly suspicious for primary bronchogenic neoplasm. PET-CT recommended to further evaluate. 2. 6 mm subpleural right middle lobe pulmonary nodule. Likely benign. Attention on follow-up recommended. 3. Cholelithiasis. 4. Aortic Atherosclerosis (ICD10-I70.0) and Emphysema (ICD10-J43.9).  Nuclear stress 02/20/2020:  Nuclear stress EF: 48%.  No T wave inversion was noted during stress.  There was no ST segment deviation noted during stress.  Defect 1: There is a medium defect of moderate severity.  This is an intermediate risk study.  The left ventricular ejection fraction is mildly decreased (45-54%).   Medium size, moderate intensity partially reversible apical/apical septal/apical anterior perfusion defect, suggestive of small area of ischemia, possibly in the distal LAD. No large areas of reversible ischemia. LVEF 48%, but  visually looks higher with normal wall motion. This is an intermediate risk study due to LVEF <49%.    Wynonia Musty [facility] Short Stay Center/Anesthesiology Phone [phone] 02/23/2020 2:09 PM

## 2020-02-24 ENCOUNTER — Ambulatory Visit (HOSPITAL_COMMUNITY): Payer: Medicare Other

## 2020-02-24 ENCOUNTER — Ambulatory Visit (HOSPITAL_COMMUNITY)
Admission: RE | Admit: 2020-02-24 | Discharge: 2020-02-24 | Disposition: A | Discharge status: Home / Self Care | Payer: Medicare Other | Attending: Emergency Medicine | Admitting: Emergency Medicine

## 2020-02-24 ENCOUNTER — Encounter (HOSPITAL_COMMUNITY)
Admission: RE | Disposition: A | Discharge status: Home / Self Care | Payer: Self-pay | Source: Home / Self Care | Attending: Emergency Medicine

## 2020-02-24 ENCOUNTER — Other Ambulatory Visit: Payer: Self-pay

## 2020-02-24 ENCOUNTER — Ambulatory Visit (HOSPITAL_COMMUNITY): Payer: Medicare Other | Admitting: Physician Assistant

## 2020-02-24 ENCOUNTER — Encounter (HOSPITAL_COMMUNITY): Payer: Self-pay | Admitting: Emergency Medicine

## 2020-02-24 DIAGNOSIS — R918 Other nonspecific abnormal finding of lung field: Secondary | ICD-10-CM | POA: Diagnosis present

## 2020-02-24 DIAGNOSIS — M8580 Other specified disorders of bone density and structure, unspecified site: Secondary | ICD-10-CM | POA: Insufficient documentation

## 2020-02-24 DIAGNOSIS — Z811 Family history of alcohol abuse and dependence: Secondary | ICD-10-CM | POA: Insufficient documentation

## 2020-02-24 DIAGNOSIS — Z8619 Personal history of other infectious and parasitic diseases: Secondary | ICD-10-CM | POA: Diagnosis not present

## 2020-02-24 DIAGNOSIS — Z8249 Family history of ischemic heart disease and other diseases of the circulatory system: Secondary | ICD-10-CM | POA: Insufficient documentation

## 2020-02-24 DIAGNOSIS — Z9889 Other specified postprocedural states: Secondary | ICD-10-CM

## 2020-02-24 DIAGNOSIS — Z87891 Personal history of nicotine dependence: Secondary | ICD-10-CM | POA: Insufficient documentation

## 2020-02-24 DIAGNOSIS — M1990 Unspecified osteoarthritis, unspecified site: Secondary | ICD-10-CM | POA: Diagnosis not present

## 2020-02-24 HISTORY — PX: BRONCHIAL BRUSHINGS: SHX5108

## 2020-02-24 HISTORY — PX: VIDEO BRONCHOSCOPY WITH ENDOBRONCHIAL NAVIGATION: SHX6175

## 2020-02-24 HISTORY — DX: Unspecified hearing loss, unspecified ear: H91.90

## 2020-02-24 HISTORY — DX: Presence of spectacles and contact lenses: Z97.3

## 2020-02-24 HISTORY — DX: Calculus of gallbladder without cholecystitis without obstruction: K80.20

## 2020-02-24 HISTORY — PX: BRONCHIAL WASHINGS: SHX5105

## 2020-02-24 HISTORY — DX: Unspecified cataract: H26.9

## 2020-02-24 HISTORY — PX: BRONCHIAL BIOPSY: SHX5109

## 2020-02-24 HISTORY — PX: BRONCHIAL NEEDLE ASPIRATION BIOPSY: SHX5106

## 2020-02-24 HISTORY — DX: Other nonspecific abnormal finding of lung field: R91.8

## 2020-02-24 HISTORY — DX: Fatty (change of) liver, not elsewhere classified: K76.0

## 2020-02-24 SURGERY — VIDEO BRONCHOSCOPY WITH ENDOBRONCHIAL NAVIGATION
Anesthesia: General | Laterality: Right

## 2020-02-24 MED ORDER — FENTANYL CITRATE (PF) 250 MCG/5ML IJ SOLN
INTRAMUSCULAR | Status: DC | PRN
  Administered 2020-02-24: 100 ug via INTRAVENOUS

## 2020-02-24 MED ORDER — GENTAMICIN SULFATE 0.1 % EX CREA
1.0000 | TOPICAL_CREAM | Freq: Two times a day (BID) | CUTANEOUS | Status: DC | PRN
Start: 2020-02-24 — End: 2020-05-03

## 2020-02-24 MED ORDER — LIDOCAINE 2% (20 MG/ML) 5 ML SYRINGE
INTRAMUSCULAR | Status: DC | PRN
  Administered 2020-02-24: 60 mg via INTRAVENOUS

## 2020-02-24 MED ORDER — CHLORHEXIDINE GLUCONATE 0.12 % MT SOLN
OROMUCOSAL | Status: AC
  Administered 2020-02-24: 15 mL
  Filled 2020-02-24: qty 15

## 2020-02-24 MED ORDER — DEXAMETHASONE SODIUM PHOSPHATE 10 MG/ML IJ SOLN
INTRAMUSCULAR | Status: DC | PRN
  Administered 2020-02-24: 5 mg via INTRAVENOUS

## 2020-02-24 MED ORDER — PROPOFOL 10 MG/ML IV BOLUS
INTRAVENOUS | Status: DC | PRN
  Administered 2020-02-24: 160 mg via INTRAVENOUS

## 2020-02-24 MED ORDER — MIDAZOLAM HCL 2 MG/2ML IJ SOLN
INTRAMUSCULAR | Status: DC | PRN
  Administered 2020-02-24: 1 mg via INTRAVENOUS

## 2020-02-24 MED ORDER — LACTATED RINGERS IV SOLN: INTRAVENOUS | Status: DC

## 2020-02-24 MED ORDER — PHENYLEPHRINE 40 MCG/ML (10ML) SYRINGE FOR IV PUSH (FOR BLOOD PRESSURE SUPPORT)
PREFILLED_SYRINGE | INTRAVENOUS | Status: DC | PRN
  Administered 2020-02-24: 80 ug via INTRAVENOUS

## 2020-02-24 MED ORDER — ROCURONIUM BROMIDE 10 MG/ML (PF) SYRINGE
PREFILLED_SYRINGE | INTRAVENOUS | Status: DC | PRN
  Administered 2020-02-24: 60 mg via INTRAVENOUS
  Administered 2020-02-24: 10 mg via INTRAVENOUS

## 2020-02-24 NOTE — Anesthesia Procedure Notes (Signed)
Procedure Name: Intubation Date/Time: 02/24/2020 1:45 PM Performed by: Alain Marion, CRNA Pre-anesthesia Checklist: Patient identified, Emergency Drugs available, Suction available and Patient being monitored Patient Re-evaluated:Patient Re-evaluated prior to induction Oxygen Delivery Method: Circle System Utilized Preoxygenation: Pre-oxygenation with 100% oxygen Induction Type: IV induction Ventilation: Mask ventilation without difficulty and Oral airway inserted - appropriate to patient size Laryngoscope Size: Mac and 4 Grade View: Grade III Tube type: Oral Tube size: 8.5 mm Number of attempts: 1 Airway Equipment and Method: Stylet and Oral airway Placement Confirmation: ETT inserted through vocal cords under direct vision,  positive ETCO2 and breath sounds checked- equal and bilateral Secured at: 22 cm Tube secured with: Tape Dental Injury: Teeth and Oropharynx as per pre-operative assessment  Comments: Intubation performed by Reece Agar, SRNA under supervision of CRNA and MDA.

## 2020-02-24 NOTE — Op Note (Signed)
Video Bronchoscopy with Electromagnetic Navigation Procedure Note  Date of Operation: 02/24/2020  Pre-op Diagnosis: Right lower lobe mass  Post-op Diagnosis: Same  Surgeon: Baltazar Apo  Assistants: None  Anesthesia: General endotracheal anesthesia  Operation: Flexible video fiberoptic bronchoscopy with electromagnetic navigation and biopsies.  Estimated Blood Loss: Minimal  Complications: None apparent  Indications and History: [name] [name] is a 69 y.o. male with history of tobacco.  He was found to have a right lower lobe mass on cardiac CT for calcium scoring.  This led to dedicated CT scan of the chest which identified a 4.7 x 3.5 x 2.7 cm right lower lobe mass.  Also noted was a small right middle lobe nodule.  Recommendation was made to achieve a tissue diagnosis via navigational bronchoscopy.  The risks, benefits, complications, treatment options and expected outcomes were discussed with the patient.  The possibilities of pneumothorax, pneumonia, reaction to medication, pulmonary aspiration, perforation of a viscus, bleeding, failure to diagnose a condition and creating a complication requiring transfusion or operation were discussed with the patient who freely signed the consent.    Description of Procedure: The patient was seen in the Preoperative Area, was examined and was deemed appropriate to proceed.  The patient was taken to [facility] endoscopy room 2, identified as [name] [name] and the procedure verified as Flexible Video Fiberoptic Bronchoscopy.  A Time Out was held and the above information confirmed.   Prior to the date of the procedure a high-resolution CT scan of the chest was performed. Utilizing [place] a virtual tracheobronchial tree was generated to allow the creation of distinct navigation pathways to the patient's parenchymal abnormalities. After being taken to the operating room general anesthesia was initiated and the patient  was orally  intubated. The video fiberoptic bronchoscope was introduced via the endotracheal tube and a general inspection was performed which showed normal airways throughout.  There were no endobronchial lesions seen. The extendable working channel and locator guide were introduced into the bronchoscope. The distinct navigation pathways prepared prior to this procedure were then utilized to navigate to within 0.7 cm of patient's right lower lobe lesion identified on CT scan. The extendable working channel was secured into place and the locator guide was withdrawn. Under fluoroscopic guidance transbronchial needle brushings, transbronchial Wang needle biopsies, and transbronchial forceps biopsies were performed to be sent for cytology and pathology. A bronchioalveolar lavage was performed in the right lower lobe and sent for cytology. At the end of the procedure a general airway inspection was performed and there was no evidence of active bleeding. The bronchoscope was removed.  The patient tolerated the procedure well. There was no significant blood loss and there were no obvious complications. A post-procedural chest x-ray is pending.  Samples: 1. Transbronchial needle brushings from right lower lobe mass 2. Transbronchial Wang needle biopsies from right lower lobe mass 3. Transbronchial forceps biopsies from right lower lobe mass 4. Bronchoalveolar lavage from right lower lobe  Plans:  The patient will be discharged from the PACU to home when recovered from anesthesia and after chest x-ray is reviewed. We will review the cytology, pathology and microbiology results with the patient when they become available. Outpatient followup will be with Dr Lamonte Sakai.    Baltazar Apo, MD, PhD 02/24/2020, 3:24 PM [facility] [phone] or if no answer [phone]

## 2020-02-24 NOTE — Discharge Instructions (Signed)
Flexible Bronchoscopy, Care After This sheet gives you information about how to care for yourself after your test. Your doctor may also give you more specific instructions. If you have problems or questions, contact your doctor. Follow these instructions at home: Eating and drinking  Do not eat or drink anything (not even water) for 2 hours after your test, or until your numbing medicine (local anesthetic) wears off.  When your numbness is gone and your cough and gag reflexes have come back, you may: ? Eat only soft foods. ? Slowly drink liquids.  The day after the test, go back to your normal diet. Driving  Do not drive for 24 hours if you were given a medicine to help you relax (sedative).  Do not drive or use heavy machinery while taking prescription pain medicine. General instructions   Take over-the-counter and prescription medicines only as told by your doctor.  Return to your normal activities as told. Ask what activities are safe for you.  Do not use any products that have nicotine or tobacco in them. This includes cigarettes and e-cigarettes. If you need help quitting, ask your doctor.  Keep all follow-up visits as told by your doctor. This is important. It is very important if you had a tissue sample (biopsy) taken. Get help right away if:  You have shortness of breath that gets worse.  You get light-headed.  You feel like you are going to pass out (faint).  You have chest pain.  You cough up: ? More than a little blood. ? More blood than before. Summary  Do not eat or drink anything (not even water) for 2 hours after your test, or until your numbing medicine wears off.  Do not use cigarettes. Do not use e-cigarettes.  Get help right away if you have chest pain. This information is not intended to replace advice given to you by your health care provider. Make sure you discuss any questions you have with your health care provider.  Please call our office for  any questions or concerns.  [phone].  Document Revised: 08/10/2017 Document Reviewed: 09/15/2016 Elsevier Patient Education  2020 [employer].

## 2020-02-24 NOTE — Interval H&P Note (Signed)
History and Physical Interval Note:  02/24/2020 8:25 PM  [name] [name]  has presented today for surgery, with the diagnosis of right upper lope lumg mass.  The various methods of treatment have been discussed with the patient and family. After consideration of risks, benefits and other options for treatment, the patient has consented to  Procedure(s): [place] (Right) as a surgical intervention.  The patient's history has been reviewed, patient examined, no change in status, stable for surgery.  I have reviewed the patient's chart and labs.  Questions were answered to the patient's satisfaction.     Collene Gobble

## 2020-02-24 NOTE — Transfer of Care (Signed)
Immediate Anesthesia Transfer of Care Note  Patient: [name] [name]  Procedure(s) Performed: VIDEO BRONCHOSCOPY WITH ENDOBRONCHIAL NAVIGATION (Right ) BRONCHIAL BIOPSIES BRONCHIAL BRUSHINGS BRONCHIAL NEEDLE ASPIRATION BIOPSIES BRONCHIAL WASHINGS  Patient Location: Endoscopy Unit  Anesthesia Type:General  Level of Consciousness: awake, alert  and oriented  Airway & Oxygen Therapy: Patient Spontanous Breathing and Patient connected to face mask oxygen  Post-op Assessment: Report given to RN and Post -op Vital signs reviewed and stable  Post vital signs: Reviewed and stable  Last Vitals:  Vitals Value Taken Time  BP 145/82 02/24/20 1528  Temp    Pulse 86 02/24/20 1530  Resp 19 02/24/20 1533  SpO2 97 % 02/24/20 1530  Vitals shown include unvalidated device data.  Last Pain:  Vitals:   02/24/20 1053  TempSrc: Oral         Complications: No complications documented.

## 2020-02-25 ENCOUNTER — Encounter (HOSPITAL_COMMUNITY): Payer: Self-pay | Admitting: Emergency Medicine

## 2020-02-25 LAB — CYTOLOGY - NON PAP

## 2020-02-25 LAB — SURGICAL PATHOLOGY

## 2020-02-25 NOTE — Anesthesia Postprocedure Evaluation (Signed)
Anesthesia Post Note  Patient: Lamark Schue  Procedure(s) Performed: VIDEO BRONCHOSCOPY WITH ENDOBRONCHIAL NAVIGATION (Right ) BRONCHIAL BIOPSIES BRONCHIAL BRUSHINGS BRONCHIAL NEEDLE ASPIRATION BIOPSIES BRONCHIAL WASHINGS     Patient location during evaluation: PACU Anesthesia Type: General Level of consciousness: awake and alert Pain management: pain level controlled Vital Signs Assessment: post-procedure vital signs reviewed and stable Respiratory status: spontaneous breathing, nonlabored ventilation, respiratory function stable and patient connected to nasal cannula oxygen Cardiovascular status: blood pressure returned to baseline and stable Postop Assessment: no apparent nausea or vomiting Anesthetic complications: no   No complications documented.  Last Vitals:  Vitals:   02/24/20 1600 02/24/20 1610  BP: (!) 123/99 (!) 118/93  Pulse: 86 81  Resp: 17 18  Temp:    SpO2: 93% 92%    Last Pain:  Vitals:   02/24/20 1610  TempSrc:   PainSc: 0-No pain                 Laparis Durrett DAVID

## 2020-02-26 ENCOUNTER — Telehealth: Payer: Self-pay | Admitting: Emergency Medicine

## 2020-02-26 DIAGNOSIS — R918 Other nonspecific abnormal finding of lung field: Secondary | ICD-10-CM

## 2020-02-26 NOTE — Telephone Encounter (Signed)
Reviewied bronchoscopy results with him by phone.  All cytologies and pathology are negative.  My suspicion is still fairly high that this could be a malignancy.  We will meet at our schedule appointment next month and talk about whether we want to follow with serial imaging versus repeat bronchoscopy versus try some other biopsy modality.  PFT will be done in July, he may be a candidate to go straight for surgical resection.  PET scan will probably be helpful as well.   -He is order PET scan for him, diagnosis is lower lobe mass -Please cancel his appointment with Dr. Ander Slade next month  Thanks.

## 2020-02-26 NOTE — Telephone Encounter (Signed)
appt with Dr. Ander Slade has been cancelled per RB. Order has been placed for pt's PET scan to be performed.  Called and spoke with pt letting him know that this had been done and he verbalized understanding. Nothing further needed.

## 2020-03-02 MED ORDER — PRALUENT 150 MG/ML ~~LOC~~ SOAJ: 1.0000 | SUBCUTANEOUS | 11 refills | Status: DC

## 2020-03-02 MED ORDER — REPATHA SURECLICK 140 MG/ML ~~LOC~~ SOAJ: 1.0000 | SUBCUTANEOUS | 11 refills | Status: DC

## 2020-03-10 ENCOUNTER — Ambulatory Visit (HOSPITAL_COMMUNITY)
Admission: RE | Admit: 2020-03-10 | Discharge: 2020-03-10 | Disposition: A | Discharge status: Home / Self Care | Payer: Medicare Other | Source: Ambulatory Visit | Attending: Emergency Medicine | Admitting: Emergency Medicine

## 2020-03-10 ENCOUNTER — Other Ambulatory Visit: Payer: Self-pay

## 2020-03-10 DIAGNOSIS — I700 Atherosclerosis of aorta: Secondary | ICD-10-CM | POA: Diagnosis not present

## 2020-03-10 DIAGNOSIS — I2510 Atherosclerotic heart disease of native coronary artery without angina pectoris: Secondary | ICD-10-CM | POA: Insufficient documentation

## 2020-03-10 DIAGNOSIS — R918 Other nonspecific abnormal finding of lung field: Secondary | ICD-10-CM | POA: Diagnosis not present

## 2020-03-10 LAB — GLUCOSE, CAPILLARY: Glucose-Capillary: 116 mg/dL — ABNORMAL HIGH (ref 70–99)

## 2020-03-10 IMAGING — PT NM PET TUM IMG INITIAL (PI) SKULL BASE T - THIGH
1 series · 3 of 3 positions shown · non-contrast
Comparison: Chest CT dated 02/12/2020

CLINICAL DATA: Initial treatment strategy for RIGHT lower lobe
mass.

EXAM:
NUCLEAR MEDICINE PET SKULL BASE TO THIGH
TECHNIQUE: 11.1 mCi F-18 FDG was injected intravenously. Full-ring PET imaging
was performed from the skull base to thigh after the radiotracer. CT
data was obtained and used for attenuation correction and anatomic
localization.
Fasting blood glucose: 116 mg/dl

[Series 1054: results mm oncology reading · 1.0mm · 0.45mm/px · 3 of 3 slices shown]
[im 1/3]
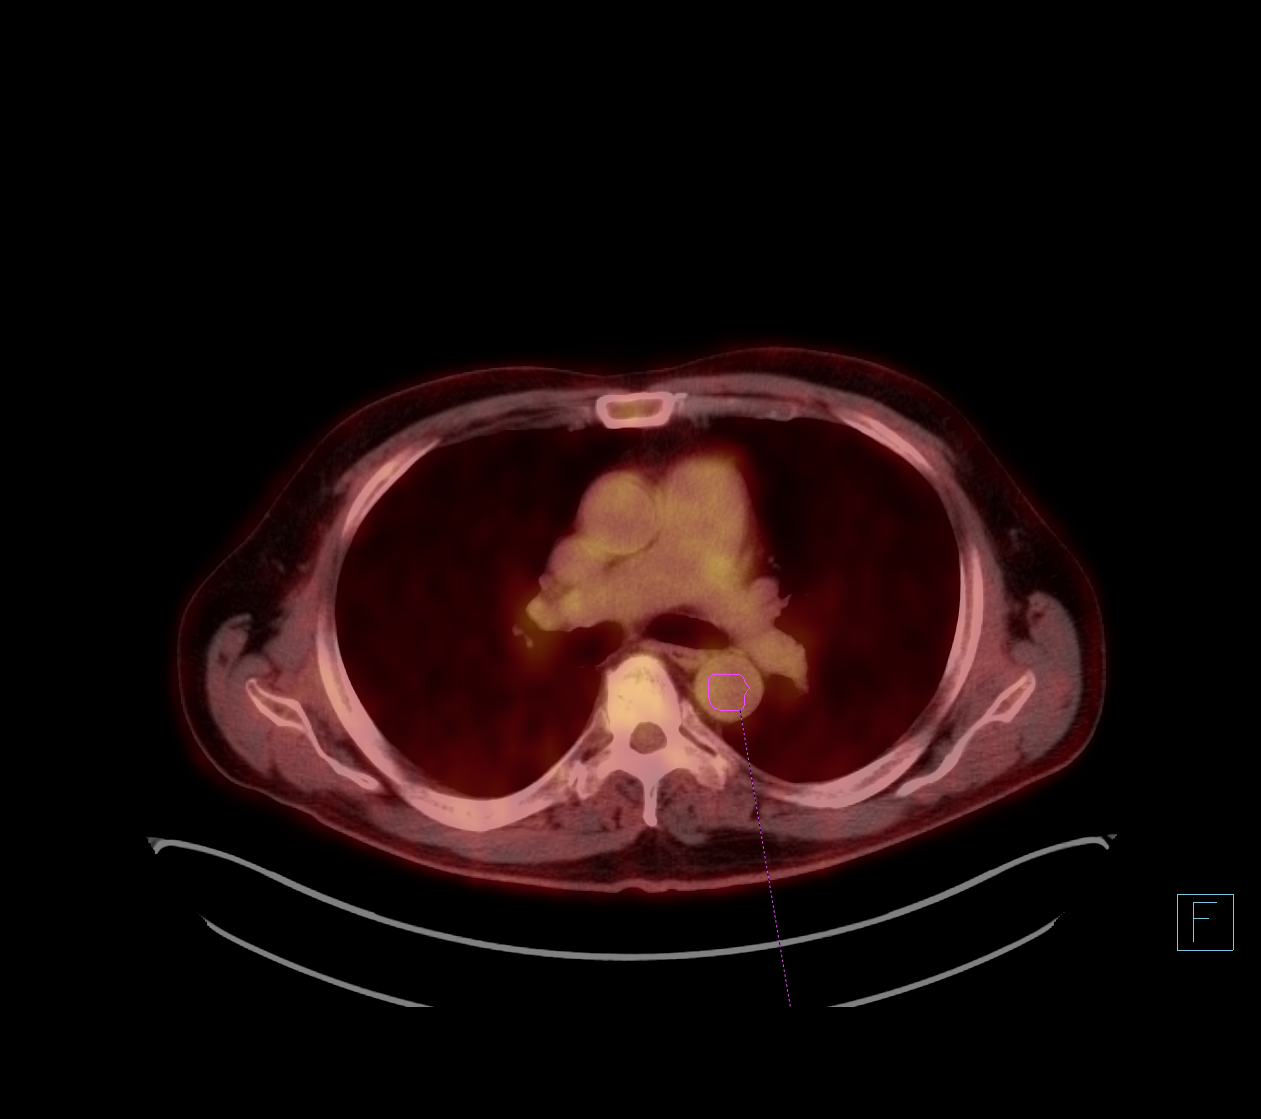
[im 2/3]
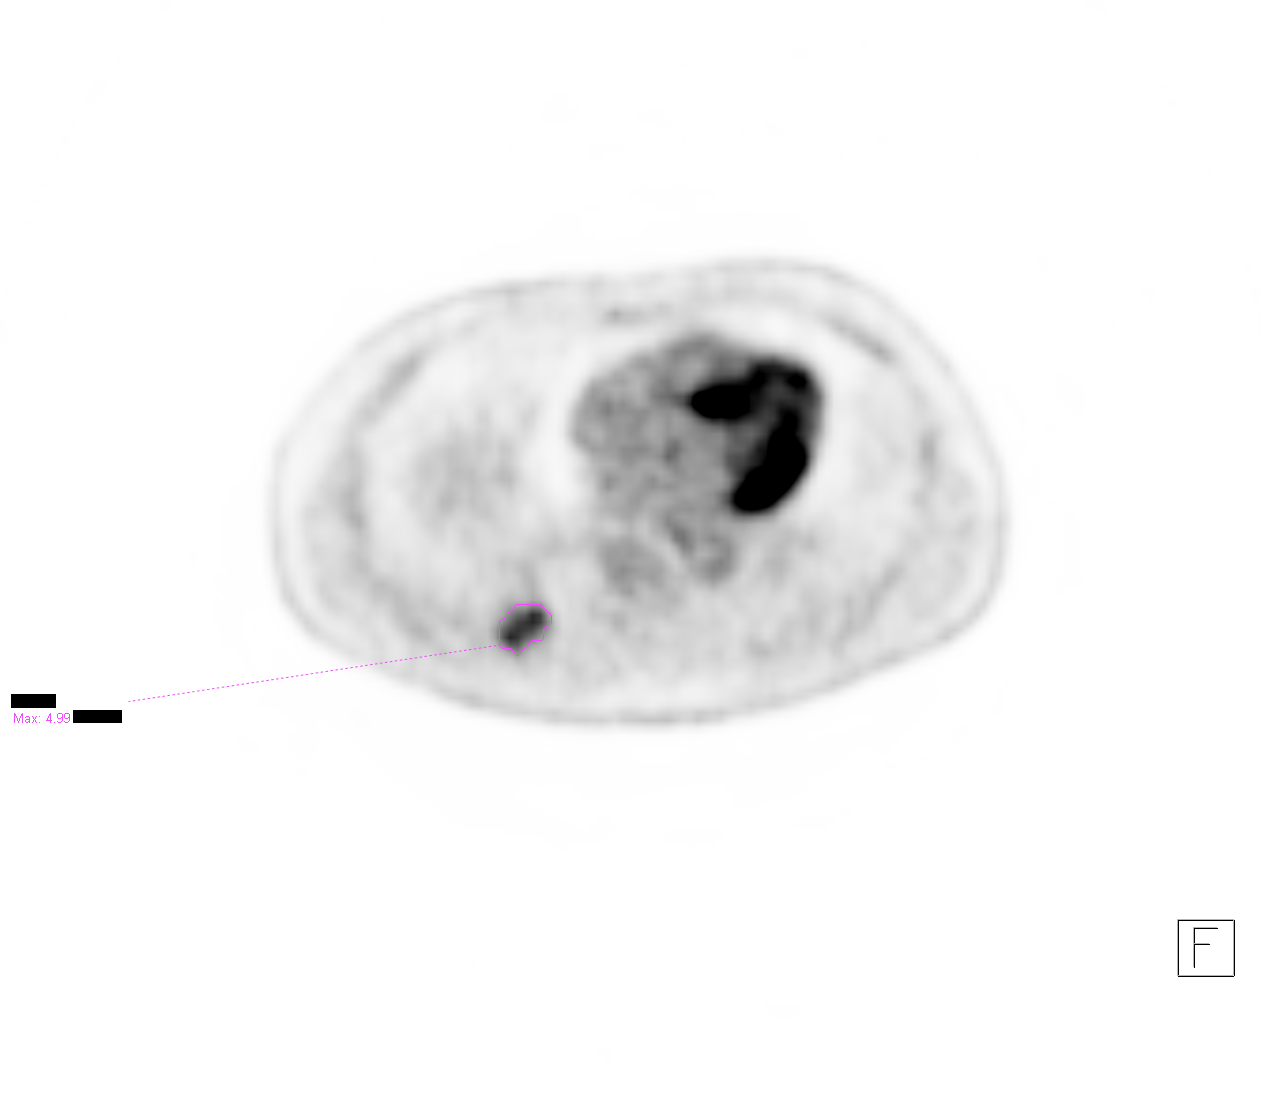
[im 3/3]
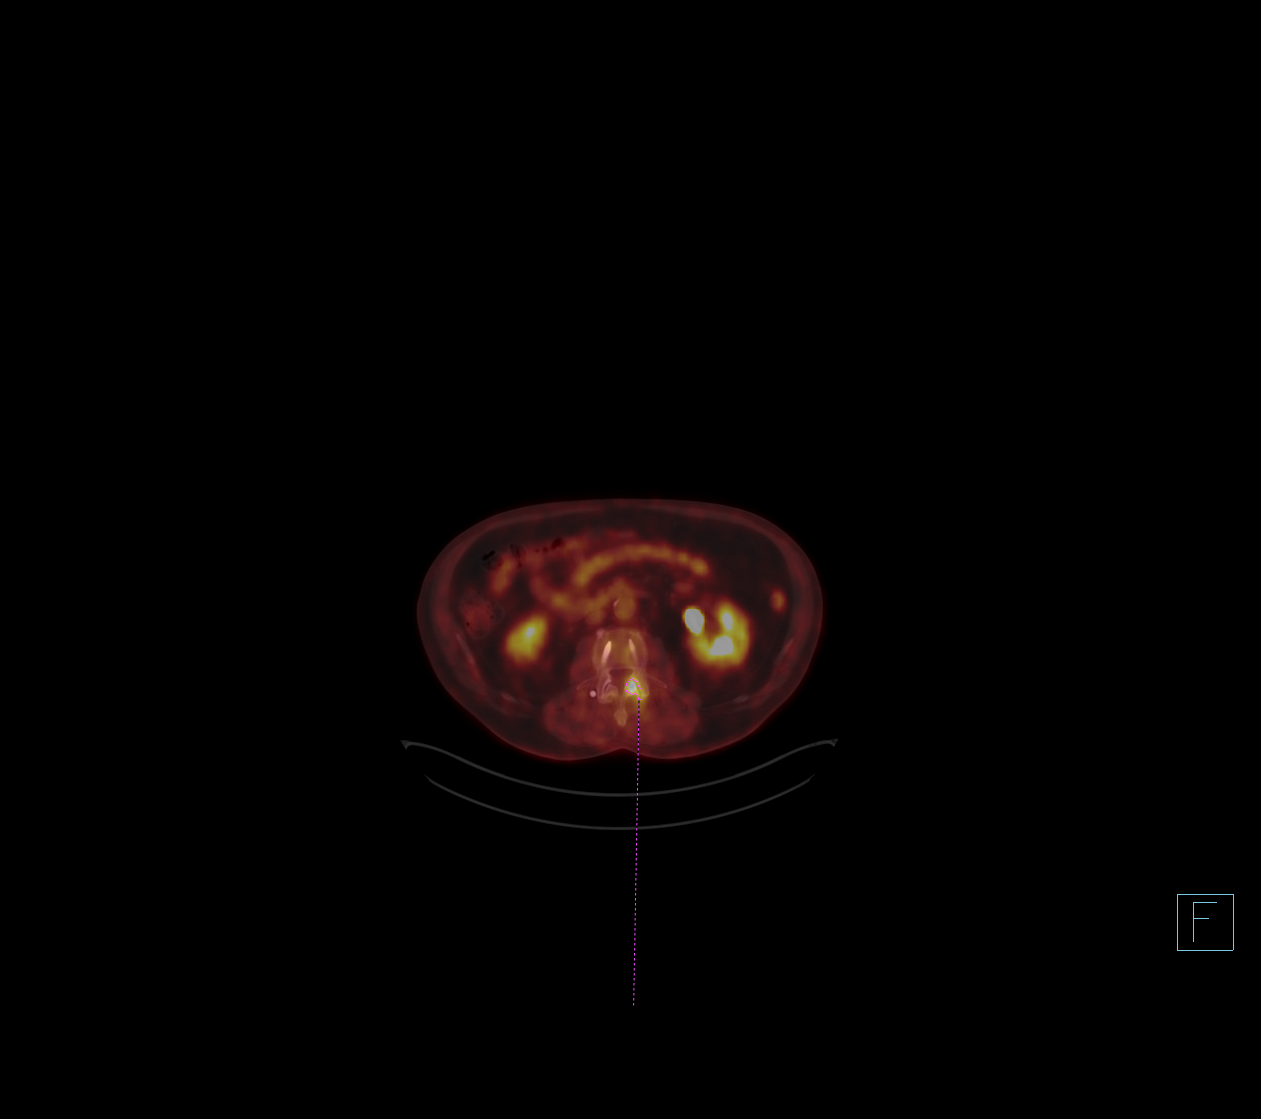

[3 of 3 positions shown; findings below may reference images not displayed]

FINDINGS: Mediastinal blood pool activity: SUV max

Liver activity: SUV max NA

NECK: No hypermetabolic lymph nodes in the neck.

Incidental CT findings: none

CHEST: Spiculated pulmonary mass in the RIGHT lower lobe is not
significantly changed accounting for the respiratory motion on the
current study. (Image 48, series [DATE] x 2.3 (SUVmax = 5.0)

No additional suspicious abnormality aside from a 4 mm nodule in the
anterior RIGHT upper lobe (image 89 of series 4) this does not show
increased FDG uptake. No enlarged or FDG avid lymph nodes.

Incidental CT findings: Calcified atheromatous plaque of the
thoracic aorta. No sign of aneurysm. Three-vessel coronary artery
disease. No pericardial effusion.

ABDOMEN/PELVIS: No abnormal hypermetabolic activity within the
liver, pancreas, adrenal glands, or spleen. No hypermetabolic lymph
nodes in the abdomen or pelvis.

Incidental CT findings: Cholelithiasis. No focal, suspicious hepatic
lesion. No nephrolithiasis. RIGHT renal cysts. No hydronephrosis.
Pancreas, spleen and adrenal glands are normal.

Small hiatal hernia.

No acute gastrointestinal process. Appendix is normal. Stool filling
much of the colon.

SKELETON: Signs of bony fusion in the lumbar spine. This is
associated with mildly increased FDG uptake posteriorly. Fusion of
L3 through L5 associated with laminectomy. Spinal degenerative
changes elsewhere. Uptake slightly more pronounced, associated with
facet arthropathy on the LEFT at L3-4.

Incidental CT findings: RIGHT shoulder arthroplasty as well and
probable tendinopathy and or bursitis about the LEFT hip.
IMPRESSION: 1. Findings concerning for pulmonary neoplasm with mass in the RIGHT
lower lobe. No signs of mediastinal adenopathy or metastatic
disease. Assessment in a thoracic oncology multi disciplinary
setting may be helpful.
2. Tiny nodule in the RIGHT middle lobe is nonspecific, attention on
follow-up.
3. Atherosclerosis and 3 vessel coronary artery disease.

Aortic Atherosclerosis (M30HR-6KG.G).

## 2020-03-10 MED ORDER — FLUDEOXYGLUCOSE F - 18 (FDG) INJECTION: 11.1000 | Freq: Once | INTRAVENOUS | Status: DC | PRN

## 2020-03-16 ENCOUNTER — Institutional Professional Consult (permissible substitution): Payer: Medicare Other | Admitting: Pulmonary Disease

## 2020-03-24 ENCOUNTER — Other Ambulatory Visit: Payer: Self-pay

## 2020-03-24 ENCOUNTER — Ambulatory Visit (INDEPENDENT_AMBULATORY_CARE_PROVIDER_SITE_OTHER): Payer: Medicare Other | Admitting: Emergency Medicine

## 2020-03-24 ENCOUNTER — Encounter: Payer: Self-pay | Admitting: Emergency Medicine

## 2020-03-24 VITALS — BP 112/58 | HR 84 | Temp 98.1°F | Ht 73.0 in | Wt 216.0 lb

## 2020-03-24 DIAGNOSIS — I2510 Atherosclerotic heart disease of native coronary artery without angina pectoris: Secondary | ICD-10-CM

## 2020-03-24 DIAGNOSIS — R918 Other nonspecific abnormal finding of lung field: Secondary | ICD-10-CM | POA: Diagnosis not present

## 2020-03-24 DIAGNOSIS — I2584 Coronary atherosclerosis due to calcified coronary lesion: Secondary | ICD-10-CM

## 2020-03-24 MED ORDER — PRALUENT 150 MG/ML ~~LOC~~ SOAJ: 1.0000 | SUBCUTANEOUS | 11 refills | Status: DC

## 2020-03-24 NOTE — Progress Notes (Signed)
Subjective:    Patient ID: [name] [name], male    DOB: [birth year], 69 y.o.   MRN: [id number]  HPI 69 year old former smoker (30 pack years) with a history of osteoarthritis, migraine headaches, TIA, hyperlipidemia.  Has been seen in [facility] cardiology office for his lipid control.  He underwent a cardiac CT for calcium scoring and restratification on 02/06/2020.  This CT showed spurious observation of a macrolobulated spiculated 2.3 x 2 x 5.7 cm right lower lobe mass with a 3 mm subpleural right middle lobe nodule.  This prompted a dedicated CT chest done on 02/13/2020 which I have reviewed.  This confirmed the presence of a bilobed spiculated 4.7 x 3.5 x 2.7 cm right lower lobe mass, also confirmed the 6 mm right middle lobe nodule.  He is here to discuss the CT and for evaluation before we proceed with bronchoscopy and tissue diagnosis.  ROV 03/24/20 --follow-up visit for former smoker with a macrolobulated spiculated right lower lobe mass and a small 3 mm right middle lobe nodule.  He underwent navigational bronchoscopy on 6/15.  The pathology and cytology was all negative for malignancy.  Given index of suspicion I performed a PET scan on 6/30.  This shows significant hypermetabolism in the right lower lobe mass, no signs of distant disease or mediastinal lymphadenopathy. He is scheduled for PFT this month.  No new symptoms to report.   Review of Systems As per HPI  Past Medical History:  Diagnosis Date  . Arthritis   . Carpal tunnel syndrome    Right  . Cataracts, bilateral    very mild  . Early cataracts, bilateral   . Fatty liver   . Gallstones   . Headache 2011   HX OF VISUAL MIGRAINES - NO PAIN --MIGRAINES ATTRIBUTED TO PT CUTTING NICOTINE PATCH IN HALF BEFORE APPLYING TO HIS SKIN.    Marland Kitchen Hepatitis 2001   C -treated and no problems since  2001 'cured'  . HOH (hard of hearing)   . Muscle cramps    hands and legs  . Osteopenia   . Pain    KNEE OA  . Right lower lobe lung mass   .  Shortness of breath dyspnea    "SOMETIMES FEELS LIKE I CAN'T CATCH A DEEP BREATH "- ON GOING FOR YEARS - [place] WORK UP 2011 - [place]  . Stroke ([place])    HX POSSIBLE TIA - RT LEG NUMBNESS AND TINGLING FINGERS AND NUMBNESS RT FACE - ALL RESOLVED ; STATES NEUROLOGIST COULD NOT FIND ANY EVIDENCE OF STROKE - THOUGHT PT'S EPISODE MIGHT BE FROM CUTTING NICOTINE PATCH IN HALF AND APPLYING TO SKIN  . Trigger finger    LEFT MIDDLE FINGER  . Wears glasses      Family History  Problem Relation Age of Onset  . Alcohol abuse Father   . Hyperlipidemia Father   . Heart attack Brother 87  . Hyperlipidemia Brother   . Hyperlipidemia Sister        3 sisters  . Heart attack Brother   . Hyperlipidemia Brother      Social History   Socioeconomic History  . Marital status: Married    Spouse name: Not on file  . Number of children: Not on file  . Years of education: Not on file  . Highest education level: Not on file  Occupational History  . Not on file  Tobacco Use  . Smoking status: Former Smoker  Quit date: 04/30/2012    Years since quitting: 7.9  . Smokeless tobacco: Never Used  Vaping Use  . Vaping Use: Former  Substance and Sexual Activity  . Alcohol use: Not Currently    Alcohol/week: 7.0 standard drinks    Types: 7 Shots of liquor per week  . Drug use: Not Currently    Types: Marijuana    Comment: ocassionally, last time smoked 07/2017  . Sexual activity: Not on file  Other Topics Concern  . Not on file  Social History Narrative  . Not on file   Social Determinants of Health   Financial Resource Strain:   . Difficulty of Paying Living Expenses:   Food Insecurity:   . Worried About Charity fundraiser in the Last Year:   . Arboriculturist in the Last Year:   Transportation Needs:   . Film/video editor (Medical):   Marland Kitchen Lack of Transportation (Non-Medical):   Physical Activity:   . Days of Exercise per Week:   . Minutes  of Exercise per Session:   Stress:   . Feeling of Stress :   Social Connections:   . Frequency of Communication with Friends and Family:   . Frequency of Social Gatherings with Friends and Family:   . Attends Religious Services:   . Active Member of Clubs or Organizations:   . Attends Archivist Meetings:   Marland Kitchen Marital Status:   Intimate Partner Violence:   . Fear of Current or Ex-Partner:   . Emotionally Abused:   Marland Kitchen Physically Abused:   . 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  Very remote huffing exposure.    Allergies  Allergen Reactions  . Statins Other (See Comments)    MYALGIAS     Outpatient Medications Prior to Visit  Medication Sig Dispense Refill  . Alirocumab (PRALUENT) 150 MG/ML SOAJ Inject 1 Dose into the skin every 14 (fourteen) days. 2 pen 11  . clindamycin (CLEOCIN) 150 MG capsule Take 150-300 mg by mouth See admin instructions. 300 mg 1 hour before Dental appt, 150 mg 6 hours after appt    . cyclobenzaprine (FLEXERIL) 10 MG tablet Take 10 mg by mouth 3 (three) times daily as needed for muscle spasms.    Marland Kitchen gentamicin cream (GARAMYCIN) 0.1 % Apply 1 application topically 2 (two) times daily as needed (irritation).    Marland Kitchen ibuprofen (ADVIL,MOTRIN) 200 MG tablet Take 400-600 mg by mouth every 4 (four) hours as needed for headache or mild pain (for pain.).     . Magnesium 500 MG TABS Take 500 mg by mouth in the morning, at noon, and at bedtime.      No facility-administered medications prior to visit.        Objective:   Physical Exam Vitals:   03/24/20 1038  BP: (!) 112/58  Pulse: 84  Temp: 98.1 F (36.7 C)  TempSrc: Oral  SpO2: 98%  Weight: 216 lb (98 kg)  Height: 6\' 1"  (1.854 m)   Gen: Pleasant, well-nourished, in no distress,  normal affect  ENT: No lesions,  mouth clear,  oropharynx clear, no postnasal drip  Neck: No JVD, no stridor  Lungs: No use of accessory muscles, no crackles or  wheezing on normal respiration, no wheeze on forced expiration  Cardiovascular: RRR, heart sounds normal, no murmur or gallops, no peripheral edema  Musculoskeletal: No deformities, no cyanosis or clubbing  Neuro: alert, awake, non focal  Skin: Warm, no lesions or rash      Assessment & Plan:  Right lower lobe lung mass We discussed the the PET findings, the degree of suspicion regarding his right lower lobe mass in detail.  I explained that the lesion is hypermetabolic and despite his negative bronchoscopy him still quite suspicious that this is a slow-growing malignancy.  We talked about the options for going forward including repeat bronchoscopy versus TTNA.  I actually believe that if his PFT are acceptable that suspicion is high enough that he should be referred for primary resection.  He understands the options.  He need to consider these in light of some other demands on his time including illness of his wife and a procedure that she needs, a procedure that his daughter needs.  That being said I have asked him to at least meet thoracic surgery, discuss possible resection.  We can work on the timing to fit his needs if his PFT are acceptable.  If thoracic surgery does not work out then I can arrange for repeat bronchoscopy or TTNA and then subsequent oncology or radiation oncology follow-up.  Baltazar Apo, MD, PhD 03/24/2020, 1:45 PM [facility] [phone] or if no answer [phone]

## 2020-03-24 NOTE — Assessment & Plan Note (Signed)
We discussed the the PET findings, the degree of suspicion regarding his right lower lobe mass in detail.  I explained that the lesion is hypermetabolic and despite his negative bronchoscopy him still quite suspicious that this is a slow-growing malignancy.  We talked about the options for going forward including repeat bronchoscopy versus TTNA.  I actually believe that if his PFT are acceptable that suspicion is high enough that he should be referred for primary resection.  He understands the options.  He need to consider these in light of some other demands on his time including illness of his wife and a procedure that she needs, a procedure that his daughter needs.  That being said I have asked him to at least meet thoracic surgery, discuss possible resection.  We can work on the timing to fit his needs if his PFT are acceptable.  If thoracic surgery does not work out then I can arrange for repeat bronchoscopy or TTNA and then subsequent oncology or radiation oncology follow-up.

## 2020-03-24 NOTE — Patient Instructions (Signed)
We will refer you to see Thoracic Surgery to discuss possible surgical resection of your pulmonary nodule.  Get your pulmonary function testing as planned.  Follow with Dr Lamonte Sakai in 1 month

## 2020-04-02 ENCOUNTER — Other Ambulatory Visit: Payer: Self-pay

## 2020-04-02 ENCOUNTER — Ambulatory Visit (INDEPENDENT_AMBULATORY_CARE_PROVIDER_SITE_OTHER): Payer: Medicare Other | Admitting: Emergency Medicine

## 2020-04-02 DIAGNOSIS — R918 Other nonspecific abnormal finding of lung field: Secondary | ICD-10-CM

## 2020-04-02 LAB — PULMONARY FUNCTION TEST
DL/VA % pred: 77 %
DL/VA: 3.14 ml/min/mmHg/L
DLCO cor % pred: 82 %
DLCO cor: 24.26 ml/min/mmHg
DLCO unc % pred: 83 %
DLCO unc: 24.6 ml/min/mmHg
FEF 25-75 Post: 4.75 L/sec
FEF 25-75 Pre: 3.55 L/sec
FEF2575-%Change-Post: 33 %
FEF2575-%Pred-Post: 157 %
FEF2575-%Pred-Pre: 118 %
FEV1-%Change-Post: 5 %
FEV1-%Pred-Post: 110 %
FEV1-%Pred-Pre: 104 %
FEV1-Post: 4.25 L
FEV1-Pre: 4.03 L
FEV1FVC-%Change-Post: 3 %
FEV1FVC-%Pred-Pre: 104 %
FEV6-%Change-Post: 2 %
FEV6-%Pred-Post: 104 %
FEV6-%Pred-Pre: 102 %
FEV6-Post: 5.16 L
FEV6-Pre: 5.05 L
FEV6FVC-%Change-Post: 1 %
FEV6FVC-%Pred-Post: 104 %
FEV6FVC-%Pred-Pre: 102 %
FVC-%Change-Post: 1 %
FVC-%Pred-Post: 101 %
FVC-%Pred-Pre: 99 %
FVC-Post: 5.26 L
FVC-Pre: 5.17 L
Post FEV1/FVC ratio: 81 %
Post FEV6/FVC ratio: 99 %
Pre FEV1/FVC ratio: 78 %
Pre FEV6/FVC Ratio: 98 %
RV % pred: 31 %
RV: 0.8 L
TLC % pred: 79 %
TLC: 6.15 L

## 2020-04-02 NOTE — Progress Notes (Signed)
PFT done today. 

## 2020-04-09 ENCOUNTER — Other Ambulatory Visit: Payer: Self-pay | Admitting: *Deleted

## 2020-04-09 ENCOUNTER — Other Ambulatory Visit: Payer: Self-pay

## 2020-04-09 ENCOUNTER — Encounter: Payer: Self-pay | Admitting: *Deleted

## 2020-04-09 ENCOUNTER — Encounter: Payer: Self-pay | Admitting: Thoracic Surgery (Cardiothoracic Vascular Surgery)

## 2020-04-09 ENCOUNTER — Institutional Professional Consult (permissible substitution) (INDEPENDENT_AMBULATORY_CARE_PROVIDER_SITE_OTHER): Payer: Medicare Other | Admitting: Thoracic Surgery (Cardiothoracic Vascular Surgery)

## 2020-04-09 VITALS — BP 145/76 | HR 90 | Temp 97.7°F | Resp 20 | Ht 73.0 in | Wt 215.0 lb

## 2020-04-09 DIAGNOSIS — R911 Solitary pulmonary nodule: Secondary | ICD-10-CM

## 2020-04-09 NOTE — Progress Notes (Signed)
[address]       [place] [address]             [phone]                    Nazire Schremp [place] Medical Record #[id number] Date of Birth: 1950-11-19  Referring: Collene Gobble, MD Primary Care: Gaynelle Arabian, MD Primary Cardiologist: No primary care provider on file.  Chief Complaint:    Chief Complaint  Patient presents with  . Lung Lesion    Surgical eval, PFT's 04/02/20, PET Scan 03/10/20, Chest CT 02/13/20    History of Present Illness:    Solomon Skowronek 69 y.o. male referred by Dr. Lamonte Sakai for surgical evaluation of a right lower lobe pulmonary mass.  This was found incidentally when he was undergoing evaluation for coronary disease.  He denies any weight changes, or neurologic symptoms.  He occasionally has some shortness of breath when going up 2 flights of stairs.  On his most recent stress test, the results were intermediate and risk and that he had a small area of distal LAD ischemia.  Medical management was recommended.  In regards to the pulmonary nodule he is undergone a PET/CT which showed increased avidity as well as a navigational bronchoscopy which was inconclusive.  He has been referred to CTS to discuss surgical options for diagnosis and potential treatment.    Smoking Hx: Quit tobacco use in 2013.   Zubrod Score: At the time of surgery this patient's most appropriate activity status/level should be described as: [x]     0    Normal activity, no symptoms []     1    Restricted in physical strenuous activity but ambulatory, able to do out light work []     2    Ambulatory and capable of self care, unable to do work activities, up and about               >50 % of waking hours                              []     3    Only limited self care, in bed greater than 50% of waking hours []     4    Completely disabled, no self care, confined to bed or chair []     5    Moribund   Past Medical History:  Diagnosis Date  . Arthritis   .  Carpal tunnel syndrome    Right  . Cataracts, bilateral    very mild  . Early cataracts, bilateral   . Fatty liver   . Gallstones   . Headache 2011   HX OF VISUAL MIGRAINES - NO PAIN --MIGRAINES ATTRIBUTED TO PT CUTTING NICOTINE PATCH IN HALF BEFORE APPLYING TO HIS SKIN.    Marland Kitchen Hepatitis 2001   C -treated and no problems since  2001 'cured'  . HOH (hard of hearing)   . Muscle cramps    hands and legs  . Osteopenia   . Pain    KNEE OA  . Right lower lobe lung mass   . Shortness of breath dyspnea    "SOMETIMES FEELS LIKE I CAN'T CATCH A DEEP BREATH "- ON GOING FOR YEARS - [place] WORK UP 2011 - [place]  . Stroke ([place])    HX POSSIBLE TIA -  RT LEG NUMBNESS AND TINGLING FINGERS AND NUMBNESS RT FACE - ALL RESOLVED ; STATES NEUROLOGIST COULD NOT FIND ANY EVIDENCE OF STROKE - THOUGHT PT'S EPISODE MIGHT BE FROM CUTTING NICOTINE PATCH IN HALF AND APPLYING TO SKIN  . Trigger finger    LEFT MIDDLE FINGER  . Wears glasses     Past Surgical History:  Procedure Laterality Date  . BRONCHIAL BIOPSY  02/24/2020   Procedure: BRONCHIAL BIOPSIES;  Surgeon: Collene Gobble, MD;  Location: [facility] ENDOSCOPY;  Service: Pulmonary;;  . BRONCHIAL BRUSHINGS  02/24/2020   Procedure: BRONCHIAL BRUSHINGS;  Surgeon: Collene Gobble, MD;  Location: [facility] ENDOSCOPY;  Service: Pulmonary;;  . BRONCHIAL NEEDLE ASPIRATION BIOPSY  02/24/2020   Procedure: BRONCHIAL NEEDLE ASPIRATION BIOPSIES;  Surgeon: Collene Gobble, MD;  Location: [facility] ENDOSCOPY;  Service: Pulmonary;;  . BRONCHIAL WASHINGS  02/24/2020   Procedure: BRONCHIAL WASHINGS;  Surgeon: Collene Gobble, MD;  Location: [facility] ENDOSCOPY;  Service: Pulmonary;;  . COLONOSCOPY    . left ankle surgery     fractured  . left knee surgery     MENISCAL REMOVED  . left shoulder     torn ligaments left shoulder  . LUMBAR FUSION     L3 L4 L5  . RIGHT CARPAL TUNNEL RELEASE  2011  . RIGHT KNEE ARTHROSCOPY   AUG OR SEPT 2015   AT  [facility]  . ROOT CANAL    . TONSILLECTOMY     as child  . 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 TOTAL KNEE ARTHROPLASTY Right 07/21/2014   Procedure: RIGHT TOTAL KNEE ARTHROPLASTY;  Surgeon: Mauri Pole, MD;  Location: [facility] ORS;  Service: Orthopedics;  Laterality: Right;  . TOTAL SHOULDER ARTHROPLASTY Right 10/17/2019   Procedure: RIGHT REVERSE TOTAL SHOULDER REPLACEMENT;  Surgeon: Netta Cedars, MD;  Location: [facility] ORS;  Service: Orthopedics;  Laterality: Right;  interscalene block  . TRIGGER FINGER RELEASE Left   . VIDEO BRONCHOSCOPY WITH ENDOBRONCHIAL NAVIGATION Right 02/24/2020   Procedure: VIDEO BRONCHOSCOPY WITH ENDOBRONCHIAL NAVIGATION;  Surgeon: Collene Gobble, MD;  Location: [facility] ENDOSCOPY;  Service: Pulmonary;  Laterality: Right;    Family History  Problem Relation Age of Onset  . Alcohol abuse Father   . Hyperlipidemia Father   . Heart attack Brother 58  . Hyperlipidemia Brother   . Hyperlipidemia Sister        3 sisters  . Heart attack Brother   . Hyperlipidemia Brother      Social History   Tobacco Use  Smoking Status Former Smoker  . Quit date: 04/30/2012  . Years since quitting: 7.9  Smokeless Tobacco Never Used    Social History   Substance and Sexual Activity  Alcohol Use Not Currently  . Alcohol/week: 7.0 standard drinks  . Types: 7 Shots of liquor per week     Allergies  Allergen Reactions  . Statins Other (See Comments)    MYALGIAS    Current Outpatient Medications  Medication Sig Dispense Refill  . Alirocumab (PRALUENT) 150 MG/ML SOAJ Inject 1 Dose into the skin every 14 (fourteen) days. 2 pen 11  . clindamycin (CLEOCIN) 150 MG capsule Take 150-300 mg by mouth See admin instructions. 300 mg 1 hour before Dental appt, 150 mg 6 hours after appt    . cyclobenzaprine (FLEXERIL) 10 MG tablet Take 10  mg by mouth 3 (three)  times daily as needed for muscle spasms.    Marland Kitchen gentamicin cream (GARAMYCIN) 0.1 % Apply 1 application topically 2 (two) times daily as needed (irritation).    Marland Kitchen ibuprofen (ADVIL,MOTRIN) 200 MG tablet Take 400-600 mg by mouth every 4 (four) hours as needed for headache or mild pain (for pain.).     . Magnesium 500 MG TABS Take 500 mg by mouth in the morning, at noon, and at bedtime.      No current facility-administered medications for this visit.    Review of Systems  Constitutional: Negative.  Negative for malaise/fatigue and weight loss.  Respiratory: Positive for shortness of breath.   Cardiovascular: Positive for chest pain.  Musculoskeletal: Positive for joint pain.  Neurological: Negative.      PHYSICAL EXAMINATION: BP (!) 145/76   Pulse 90   Temp 97.7 F (36.5 C) (Skin)   Resp 20   Ht 6\' 1"  (1.854 m)   Wt (!) 215 lb (97.5 kg)   SpO2 96% Comment: RA  BMI 28.37 kg/m  Physical Exam Constitutional:      Appearance: Normal appearance.  HENT:     Head: Normocephalic and atraumatic.  Eyes:     Extraocular Movements: Extraocular movements intact.     Conjunctiva/sclera: Conjunctivae normal.  Cardiovascular:     Rate and Rhythm: Normal rate.  Pulmonary:     Effort: Pulmonary effort is normal. No respiratory distress.  Abdominal:     General: There is no distension.  Musculoskeletal:        General: Normal range of motion.     Cervical back: Normal range of motion.  Skin:    General: Skin is warm and dry.  Neurological:     General: No focal deficit present.     Mental Status: He is alert and oriented to person, place, and time.     Diagnostic Studies & Laboratory data:     Recent Radiology Findings:   No results found.     I have independently reviewed the above radiology studies  and reviewed the findings with the patient.   Recent Lab Findings: Lab Results  Component Value Date   WBC 6.3 02/17/2020   HGB 15.1 02/17/2020   HCT  43.9 02/17/2020   PLT 193.0 02/17/2020   GLUCOSE 213 (H) 02/17/2020   CHOL  10/31/2007    173        ATP III CLASSIFICATION:  <200     mg/dL   Desirable  200-239  mg/dL   Borderline High  >=240    mg/dL   High   TRIG 129 10/31/2007   HDL 31 (L) 10/31/2007   LDLCALC (H) 10/31/2007    116        Total Cholesterol/HDL:CHD Risk Coronary Heart Disease Risk Table                     Men   Women  1/2 Average Risk   3.4   3.3   ALT 27 02/17/2020   AST 21 02/17/2020   NA 133 (L) 02/17/2020   K 4.1 02/17/2020   CL 101 02/17/2020   CREATININE 1.01 02/17/2020   BUN 25 (H) 02/17/2020   CO2 27 02/17/2020   INR 1.1 (H) 02/17/2020     PFTs: - FVC: 99% - FEV1: 104% -DLCO: 82%  Problem List: 3.1 cm right lower lobe pulmonary mass with SUV of 5. Intermediate risk stress test with concern for LAD disease distally.   Assessment / Plan:  69 year old male with a 3.1 cm right lower lobe pulmonary mass very concerning for primary lung cancer.  Although he is undergone nondiagnostic navigational bronchoscopy, the PET/CT does show increased uptake.  We discussed the options for a right robotic assisted wedge resection and possible completion lobectomy and he is agreeable to proceed on August 18.  I will touch base with his cardiologist to ensure that no further work-up is required given his intermediate risk stress test.  I will be imperative to perform the Exparel at the beginning of the procedure just in case.  Also he states that tramadol is not a good medication for him, and will require something a little bit stronger.     I  spent 55 minutes with  the patient face to face and greater then 50% of the time was spent in counseling and coordination of care.    Lajuana Matte 04/09/2020 5:56 PM

## 2020-04-23 ENCOUNTER — Encounter (HOSPITAL_COMMUNITY): Payer: Self-pay

## 2020-04-23 NOTE — Pre-Procedure Instructions (Signed)
Eldridge Marcott  [birth year]    Your procedure is scheduled on Wednesday, April 28, 2020 at 8:30 AM.   Report to [facility] Entrance "A" Admitting Office at 6:30 AM.   Call this number if you have problems the morning of surgery: [phone]   Questions prior to day of surgery, please call [phone] between 8 & 4 PM.   Remember:  Do not eat or drink after midnight Tuesday, 04/27/20.  Take these medicines the morning of surgery with A SIP OF WATER: Loratadine (Claritin)  Stop NSAIDS (Ibuprofen, Aleve, etc) as of today prior to surgery. Do not use Aspirin containing products, Vitamins, Herbal medications or Fish Oil prior to surgery.    Do not wear jewelry.  Do not wear lotions, powders, cologne or deodorant.  Men may shave face and neck.  Do not bring valuables to the hospital.  [facility] is not responsible for any belongings or valuables.  Contacts, dentures or bridgework may not be worn into surgery.  Leave your suitcase in the car.  After surgery it may be brought to your room.  For patients admitted to the hospital, discharge time will be determined by your treatment team.  [facility] - Preparing for Surgery  Before surgery, you can play an important role.  Because skin is not sterile, your skin needs to be as free of germs as possible.  You can reduce the number of germs on you skin by washing with CHG (chlorahexidine gluconate) soap before surgery.  CHG is an antiseptic cleaner which kills germs and bonds with the skin to continue killing germs even after washing.  Oral Hygiene is also important in reducing the risk of infection.  Remember to brush your teeth with your regular toothpaste the morning of surgery.  Please DO NOT use if you have an allergy to CHG or antibacterial soaps.  If your skin becomes reddened/irritated stop using the CHG and inform your nurse when you arrive at Short Stay.  Do not shave (including legs and underarms) for at least 48 hours  prior to the first CHG shower.  You may shave your face.  Please follow these instructions carefully:   1.  Shower with CHG Soap the night before surgery and the morning of Surgery.  2.  If you choose to wash your hair, wash your hair first as usual with your normal shampoo.  3.  After you shampoo, rinse your hair and body thoroughly to remove the shampoo. 4.  Use CHG as you would any other liquid soap.  You can apply chg directly to the skin and wash gently with a      scrungie or washcloth.           5.  Apply the CHG Soap to your body ONLY FROM THE NECK DOWN.   Do not use on open wounds or open sores. Avoid contact with your eyes, ears, mouth and genitals (private parts).  Wash genitals (private parts) with your normal soap - do this prior to using CHG soap.  6.  Wash thoroughly, paying special attention to the area where your surgery will be performed.  7.  Thoroughly rinse your body with warm water from the neck down.  8.  DO NOT shower/wash with your normal soap after using and rinsing off the CHG Soap.  9.  Pat yourself dry with a clean towel.            10.  Wear clean pajamas.  11.  Place clean sheets on your bed the night of your first shower and do not sleep with pets.  Day of Surgery  Shower as above. Do not apply any lotions/deodorants the morning of surgery.   Please wear clean clothes to the hospital. Remember to brush your teeth with toothpaste.  Please read over the fact sheets that you were given.

## 2020-04-26 ENCOUNTER — Encounter (HOSPITAL_COMMUNITY)
Admission: RE | Admit: 2020-04-26 | Discharge: 2020-04-26 | Disposition: A | Discharge status: Home / Self Care | Payer: Medicare Other | Source: Ambulatory Visit | Attending: Thoracic Surgery (Cardiothoracic Vascular Surgery) | Admitting: Thoracic Surgery (Cardiothoracic Vascular Surgery)

## 2020-04-26 ENCOUNTER — Other Ambulatory Visit (HOSPITAL_COMMUNITY)
Admission: RE | Admit: 2020-04-26 | Discharge: 2020-04-26 | Disposition: A | Discharge status: Home / Self Care | Payer: Medicare Other | Source: Ambulatory Visit | Attending: Thoracic Surgery (Cardiothoracic Vascular Surgery) | Admitting: Thoracic Surgery (Cardiothoracic Vascular Surgery)

## 2020-04-26 ENCOUNTER — Ambulatory Visit (HOSPITAL_COMMUNITY)
Admission: RE | Admit: 2020-04-26 | Discharge: 2020-04-26 | Disposition: A | Discharge status: Home / Self Care | Payer: Medicare Other | Source: Ambulatory Visit | Attending: Thoracic Surgery (Cardiothoracic Vascular Surgery) | Admitting: Thoracic Surgery (Cardiothoracic Vascular Surgery)

## 2020-04-26 ENCOUNTER — Encounter (HOSPITAL_COMMUNITY): Payer: Self-pay

## 2020-04-26 ENCOUNTER — Other Ambulatory Visit: Payer: Self-pay

## 2020-04-26 DIAGNOSIS — R911 Solitary pulmonary nodule: Secondary | ICD-10-CM | POA: Insufficient documentation

## 2020-04-26 DIAGNOSIS — Z20822 Contact with and (suspected) exposure to covid-19: Secondary | ICD-10-CM | POA: Insufficient documentation

## 2020-04-26 DIAGNOSIS — Z01818 Encounter for other preprocedural examination: Secondary | ICD-10-CM | POA: Insufficient documentation

## 2020-04-26 HISTORY — DX: Personal history of urinary calculi: Z87.442

## 2020-04-26 HISTORY — DX: Personal history of other diseases of the digestive system: Z87.19

## 2020-04-26 HISTORY — DX: Gastro-esophageal reflux disease without esophagitis: K21.9

## 2020-04-26 LAB — CBC
HCT: 46 % (ref 39.0–52.0)
Hemoglobin: 14.8 g/dL (ref 13.0–17.0)
MCH: 30.1 pg (ref 26.0–34.0)
MCHC: 32.2 g/dL (ref 30.0–36.0)
MCV: 93.7 fL (ref 80.0–100.0)
Platelets: 191 10*3/uL (ref 150–400)
RBC: 4.91 MIL/uL (ref 4.22–5.81)
RDW: 11.8 % (ref 11.5–15.5)
WBC: 7.4 10*3/uL (ref 4.0–10.5)
nRBC: 0 % (ref 0.0–0.2)

## 2020-04-26 LAB — COMPREHENSIVE METABOLIC PANEL
ALT: 17 U/L (ref 0–44)
AST: 19 U/L (ref 15–41)
Albumin: 3.9 g/dL (ref 3.5–5.0)
Alkaline Phosphatase: 75 U/L (ref 38–126)
Anion gap: 9 (ref 5–15)
BUN: 18 mg/dL (ref 8–23)
CO2: 20 mmol/L — ABNORMAL LOW (ref 22–32)
Calcium: 9.1 mg/dL (ref 8.9–10.3)
Chloride: 109 mmol/L (ref 98–111)
Creatinine, Ser: 0.9 mg/dL (ref 0.61–1.24)
GFR calc Af Amer: >60 mL/min (ref >60)
GFR calc non Af Amer: >60 mL/min (ref >60)
Glucose, Bld: 120 mg/dL — ABNORMAL HIGH (ref 70–99)
Potassium: 4.1 mmol/L (ref 3.5–5.1)
Sodium: 138 mmol/L (ref 135–145)
Total Bilirubin: 0.7 mg/dL (ref 0.3–1.2)
Total Protein: 6.6 g/dL (ref 6.5–8.1)

## 2020-04-26 LAB — URINALYSIS, ROUTINE W REFLEX MICROSCOPIC
Bilirubin Urine: NEGATIVE
Glucose, UA: NEGATIVE mg/dL
Hgb urine dipstick: NEGATIVE
Ketones, ur: NEGATIVE mg/dL
Leukocytes,Ua: NEGATIVE
Nitrite: NEGATIVE
Protein, ur: NEGATIVE mg/dL
Specific Gravity, Urine: 1.015 (ref 1.005–1.030)
pH: 5 (ref 5.0–8.0)

## 2020-04-26 LAB — BLOOD GAS, ARTERIAL
Acid-base deficit: 1.6 mmol/L (ref 0.0–2.0)
Bicarbonate: 22.4 mmol/L (ref 20.0–28.0)
Drawn by: 58793
FIO2: 21
O2 Saturation: 98.4 %
Patient temperature: 37
pCO2 arterial: 36.3 mmHg (ref 32.0–48.0)
pH, Arterial: 7.407 (ref 7.350–7.450)
pO2, Arterial: 112 mmHg — ABNORMAL HIGH (ref 83.0–108.0)

## 2020-04-26 LAB — PROTIME-INR
INR: 1.1 (ref 0.8–1.2)
Prothrombin Time: 13.7 seconds (ref 11.4–15.2)

## 2020-04-26 LAB — SURGICAL PCR SCREEN
MRSA, PCR: NEGATIVE
Staphylococcus aureus: NEGATIVE

## 2020-04-26 LAB — TYPE AND SCREEN
ABO/RH(D): O NEG
Antibody Screen: NEGATIVE

## 2020-04-26 LAB — APTT: aPTT: 30 seconds (ref 24–36)

## 2020-04-26 LAB — SARS CORONAVIRUS 2 (TAT 6-24 HRS): SARS Coronavirus 2: NEGATIVE

## 2020-04-26 NOTE — Progress Notes (Addendum)
PCP - Dr. Marisue Humble Cardiologist - Dr. Debara Pickett  Chest x-ray - today EKG - today Stress Test - 02/20/20 ECHO - 2012 Cardiac Cath - no  COVID TEST- scheduled for today   Anesthesia review: no, see Karoline Caldwell' note from 02/23/20  Patient denies shortness of breath, fever, cough and chest pain at PAT appointment   All instructions explained to the patient, with a verbal understanding of the material. Patient agrees to go over the instructions while at home for a better understanding. Patient also instructed to self quarantine after being tested for COVID-19. The opportunity to ask questions was provided.

## 2020-04-28 ENCOUNTER — Other Ambulatory Visit: Payer: Self-pay

## 2020-04-28 ENCOUNTER — Encounter (HOSPITAL_COMMUNITY)
Admission: RE | Disposition: A | Discharge status: Home Health | Payer: Self-pay | Source: Home / Self Care | Attending: Thoracic Surgery (Cardiothoracic Vascular Surgery)

## 2020-04-28 ENCOUNTER — Encounter (HOSPITAL_COMMUNITY): Payer: Self-pay | Admitting: Thoracic Surgery (Cardiothoracic Vascular Surgery)

## 2020-04-28 ENCOUNTER — Inpatient Hospital Stay (HOSPITAL_COMMUNITY)
Admission: RE | Admit: 2020-04-28 | Discharge: 2020-05-03 | DRG: 164 | Disposition: A | Discharge status: Home Health | Payer: Medicare Other | Attending: Thoracic Surgery (Cardiothoracic Vascular Surgery) | Admitting: Thoracic Surgery (Cardiothoracic Vascular Surgery)

## 2020-04-28 ENCOUNTER — Inpatient Hospital Stay (HOSPITAL_COMMUNITY): Payer: Medicare Other | Admitting: Anesthesiology

## 2020-04-28 ENCOUNTER — Inpatient Hospital Stay (HOSPITAL_COMMUNITY): Payer: Medicare Other

## 2020-04-28 DIAGNOSIS — K219 Gastro-esophageal reflux disease without esophagitis: Secondary | ICD-10-CM | POA: Diagnosis present

## 2020-04-28 DIAGNOSIS — R000 Tachycardia, unspecified: Secondary | ICD-10-CM | POA: Diagnosis not present

## 2020-04-28 DIAGNOSIS — Z87891 Personal history of nicotine dependence: Secondary | ICD-10-CM | POA: Diagnosis not present

## 2020-04-28 DIAGNOSIS — Z8249 Family history of ischemic heart disease and other diseases of the circulatory system: Secondary | ICD-10-CM | POA: Diagnosis not present

## 2020-04-28 DIAGNOSIS — Z9689 Presence of other specified functional implants: Secondary | ICD-10-CM

## 2020-04-28 DIAGNOSIS — I10 Essential (primary) hypertension: Secondary | ICD-10-CM | POA: Diagnosis not present

## 2020-04-28 DIAGNOSIS — Z981 Arthrodesis status: Secondary | ICD-10-CM | POA: Diagnosis not present

## 2020-04-28 DIAGNOSIS — Z8673 Personal history of transient ischemic attack (TIA), and cerebral infarction without residual deficits: Secondary | ICD-10-CM

## 2020-04-28 DIAGNOSIS — M8580 Other specified disorders of bone density and structure, unspecified site: Secondary | ICD-10-CM | POA: Diagnosis present

## 2020-04-28 DIAGNOSIS — Z811 Family history of alcohol abuse and dependence: Secondary | ICD-10-CM

## 2020-04-28 DIAGNOSIS — Z888 Allergy status to other drugs, medicaments and biological substances status: Secondary | ICD-10-CM | POA: Diagnosis not present

## 2020-04-28 DIAGNOSIS — J95811 Postprocedural pneumothorax: Secondary | ICD-10-CM | POA: Diagnosis not present

## 2020-04-28 DIAGNOSIS — K5900 Constipation, unspecified: Secondary | ICD-10-CM | POA: Diagnosis not present

## 2020-04-28 DIAGNOSIS — D72829 Elevated white blood cell count, unspecified: Secondary | ICD-10-CM | POA: Diagnosis not present

## 2020-04-28 DIAGNOSIS — J9811 Atelectasis: Secondary | ICD-10-CM | POA: Diagnosis not present

## 2020-04-28 DIAGNOSIS — Z9889 Other specified postprocedural states: Secondary | ICD-10-CM

## 2020-04-28 DIAGNOSIS — Z20822 Contact with and (suspected) exposure to covid-19: Secondary | ICD-10-CM | POA: Diagnosis present

## 2020-04-28 DIAGNOSIS — Z96653 Presence of artificial knee joint, bilateral: Secondary | ICD-10-CM | POA: Diagnosis present

## 2020-04-28 DIAGNOSIS — J939 Pneumothorax, unspecified: Secondary | ICD-10-CM

## 2020-04-28 DIAGNOSIS — Z96611 Presence of right artificial shoulder joint: Secondary | ICD-10-CM | POA: Diagnosis present

## 2020-04-28 DIAGNOSIS — Z79899 Other long term (current) drug therapy: Secondary | ICD-10-CM

## 2020-04-28 DIAGNOSIS — R911 Solitary pulmonary nodule: Principal | ICD-10-CM

## 2020-04-28 DIAGNOSIS — Z83438 Family history of other disorder of lipoprotein metabolism and other lipidemia: Secondary | ICD-10-CM | POA: Diagnosis not present

## 2020-04-28 DIAGNOSIS — C3431 Malignant neoplasm of lower lobe, right bronchus or lung: Principal | ICD-10-CM | POA: Diagnosis present

## 2020-04-28 DIAGNOSIS — Z09 Encounter for follow-up examination after completed treatment for conditions other than malignant neoplasm: Secondary | ICD-10-CM

## 2020-04-28 HISTORY — PX: INTERCOSTAL NERVE BLOCK: SHX5021

## 2020-04-28 HISTORY — DX: Malignant (primary) neoplasm, unspecified: C80.1

## 2020-04-28 HISTORY — PX: THORACOSCOPY: SUR1347

## 2020-04-28 HISTORY — PX: WEDGE RESECTION: SHX5070

## 2020-04-28 HISTORY — PX: NODE DISSECTION: SHX5269

## 2020-04-28 LAB — GLUCOSE, CAPILLARY
Glucose-Capillary: 158 mg/dL — ABNORMAL HIGH (ref 70–99)
Glucose-Capillary: 160 mg/dL — ABNORMAL HIGH (ref 70–99)

## 2020-04-28 SURGERY — WEDGE RESECTION, LUNG, ROBOT-ASSISTED, THORACOSCOPIC
Anesthesia: General | Site: Chest | Laterality: Right

## 2020-04-28 MED ORDER — INSULIN ASPART 100 UNIT/ML ~~LOC~~ SOLN
0.0000 [IU] | Freq: Three times a day (TID) | SUBCUTANEOUS | Status: DC
  Administered 2020-04-28 (×2): 2 [IU] via SUBCUTANEOUS
  Administered 2020-04-29: 4 [IU] via SUBCUTANEOUS
  Administered 2020-04-29: 3 [IU] via SUBCUTANEOUS
  Administered 2020-04-29 – 2020-04-30 (×2): 2 [IU] via SUBCUTANEOUS

## 2020-04-28 MED ORDER — ACETAMINOPHEN 160 MG/5ML PO SOLN: 1000.0000 mg | Freq: Four times a day (QID) | ORAL | Status: AC

## 2020-04-28 MED ORDER — KETAMINE HCL 50 MG/5ML IJ SOSY
PREFILLED_SYRINGE | INTRAMUSCULAR | Status: AC
  Filled 2020-04-28: qty 5

## 2020-04-28 MED ORDER — MORPHINE SULFATE (PF) 2 MG/ML IV SOLN
2.0000 mg | INTRAVENOUS | Status: DC | PRN
  Administered 2020-04-28 – 2020-05-01 (×15): 2 mg via INTRAVENOUS
  Filled 2020-04-28 (×16): qty 1

## 2020-04-28 MED ORDER — ACETAMINOPHEN 10 MG/ML IV SOLN
INTRAVENOUS | Status: AC
  Filled 2020-04-28: qty 100

## 2020-04-28 MED ORDER — SENNOSIDES-DOCUSATE SODIUM 8.6-50 MG PO TABS
1.0000 | ORAL_TABLET | Freq: Every day | ORAL | Status: DC
  Administered 2020-04-28 – 2020-05-02 (×5): 1 via ORAL
  Filled 2020-04-28 (×5): qty 1

## 2020-04-28 MED ORDER — CEFAZOLIN SODIUM-DEXTROSE 2-4 GM/100ML-% IV SOLN
2.0000 g | Freq: Three times a day (TID) | INTRAVENOUS | Status: AC
  Administered 2020-04-28 (×2): 2 g via INTRAVENOUS
  Filled 2020-04-28 (×2): qty 100

## 2020-04-28 MED ORDER — OXYCODONE HCL 5 MG PO TABS: 5.0000 mg | ORAL_TABLET | Freq: Once | ORAL | Status: DC | PRN

## 2020-04-28 MED ORDER — ROCURONIUM BROMIDE 10 MG/ML (PF) SYRINGE
PREFILLED_SYRINGE | INTRAVENOUS | Status: DC | PRN
  Administered 2020-04-28: 70 mg via INTRAVENOUS
  Administered 2020-04-28: 30 mg via INTRAVENOUS
  Administered 2020-04-28 (×2): 10 mg via INTRAVENOUS

## 2020-04-28 MED ORDER — LACTATED RINGERS IV SOLN: INTRAVENOUS | Status: DC | PRN

## 2020-04-28 MED ORDER — ONDANSETRON HCL 4 MG/2ML IJ SOLN: 4.0000 mg | Freq: Four times a day (QID) | INTRAMUSCULAR | Status: DC | PRN

## 2020-04-28 MED ORDER — DEXAMETHASONE SODIUM PHOSPHATE 10 MG/ML IJ SOLN
INTRAMUSCULAR | Status: DC | PRN
  Administered 2020-04-28: 10 mg via INTRAVENOUS

## 2020-04-28 MED ORDER — DEXMEDETOMIDINE (PRECEDEX) IN NS 20 MCG/5ML (4 MCG/ML) IV SYRINGE
PREFILLED_SYRINGE | INTRAVENOUS | Status: DC | PRN
  Administered 2020-04-28 (×2): 4 ug via INTRAVENOUS

## 2020-04-28 MED ORDER — DEXAMETHASONE SODIUM PHOSPHATE 10 MG/ML IJ SOLN
INTRAMUSCULAR | Status: AC
  Filled 2020-04-28: qty 1

## 2020-04-28 MED ORDER — ROCURONIUM BROMIDE 10 MG/ML (PF) SYRINGE
PREFILLED_SYRINGE | INTRAVENOUS | Status: AC
  Filled 2020-04-28: qty 10

## 2020-04-28 MED ORDER — HEMOSTATIC AGENTS (NO CHARGE) OPTIME
TOPICAL | Status: DC | PRN
  Administered 2020-04-28: 2 via TOPICAL

## 2020-04-28 MED ORDER — PHENYLEPHRINE 40 MCG/ML (10ML) SYRINGE FOR IV PUSH (FOR BLOOD PRESSURE SUPPORT)
PREFILLED_SYRINGE | INTRAVENOUS | Status: AC
  Filled 2020-04-28: qty 10

## 2020-04-28 MED ORDER — FENTANYL CITRATE (PF) 100 MCG/2ML IJ SOLN: 25.0000 ug | INTRAMUSCULAR | Status: DC | PRN

## 2020-04-28 MED ORDER — CEFAZOLIN SODIUM-DEXTROSE 2-4 GM/100ML-% IV SOLN
2.0000 g | INTRAVENOUS | Status: AC
  Administered 2020-04-28: 2 g via INTRAVENOUS
  Filled 2020-04-28: qty 100

## 2020-04-28 MED ORDER — PROPOFOL 10 MG/ML IV BOLUS
INTRAVENOUS | Status: DC | PRN
  Administered 2020-04-28: 200 mg via INTRAVENOUS

## 2020-04-28 MED ORDER — BISACODYL 5 MG PO TBEC
10.0000 mg | DELAYED_RELEASE_TABLET | Freq: Every day | ORAL | Status: DC
  Administered 2020-04-29 – 2020-05-02 (×4): 10 mg via ORAL
  Filled 2020-04-28 (×5): qty 2

## 2020-04-28 MED ORDER — CHLORHEXIDINE GLUCONATE 0.12 % MT SOLN
15.0000 mL | Freq: Once | OROMUCOSAL | Status: AC
  Administered 2020-04-28: 15 mL via OROMUCOSAL
  Filled 2020-04-28: qty 15

## 2020-04-28 MED ORDER — POLYVINYL ALCOHOL 1.4 % OP SOLN
1.0000 [drp] | OPHTHALMIC | Status: DC | PRN
  Administered 2020-04-28: 1 [drp] via OPHTHALMIC
  Filled 2020-04-28: qty 15

## 2020-04-28 MED ORDER — KETOROLAC TROMETHAMINE 15 MG/ML IJ SOLN
15.0000 mg | Freq: Four times a day (QID) | INTRAMUSCULAR | Status: AC
  Administered 2020-04-28 – 2020-05-03 (×19): 15 mg via INTRAVENOUS
  Filled 2020-04-28 (×19): qty 1

## 2020-04-28 MED ORDER — FENTANYL CITRATE (PF) 250 MCG/5ML IJ SOLN
INTRAMUSCULAR | Status: DC | PRN
  Administered 2020-04-28 (×5): 50 ug via INTRAVENOUS

## 2020-04-28 MED ORDER — BUPIVACAINE HCL (PF) 0.5 % IJ SOLN
INTRAMUSCULAR | Status: AC
  Filled 2020-04-28: qty 30

## 2020-04-28 MED ORDER — PROPOFOL 10 MG/ML IV BOLUS
INTRAVENOUS | Status: AC
  Filled 2020-04-28: qty 20

## 2020-04-28 MED ORDER — ACETAMINOPHEN 10 MG/ML IV SOLN
INTRAVENOUS | Status: DC | PRN
  Administered 2020-04-28: 1000 mg via INTRAVENOUS

## 2020-04-28 MED ORDER — LIDOCAINE 2% (20 MG/ML) 5 ML SYRINGE
INTRAMUSCULAR | Status: DC | PRN
  Administered 2020-04-28: 100 mg via INTRAVENOUS

## 2020-04-28 MED ORDER — SODIUM CHLORIDE FLUSH 0.9 % IV SOLN
INTRAVENOUS | Status: DC | PRN
  Administered 2020-04-28: 100 mL

## 2020-04-28 MED ORDER — OXYCODONE HCL 5 MG/5ML PO SOLN: 5.0000 mg | Freq: Once | ORAL | Status: DC | PRN

## 2020-04-28 MED ORDER — MIDAZOLAM HCL 5 MG/5ML IJ SOLN
INTRAMUSCULAR | Status: DC | PRN
  Administered 2020-04-28 (×2): 1 mg via INTRAVENOUS

## 2020-04-28 MED ORDER — ONDANSETRON HCL 4 MG/2ML IJ SOLN
INTRAMUSCULAR | Status: DC | PRN
  Administered 2020-04-28: 4 mg via INTRAVENOUS

## 2020-04-28 MED ORDER — EPHEDRINE 5 MG/ML INJ
INTRAVENOUS | Status: AC
  Filled 2020-04-28: qty 10

## 2020-04-28 MED ORDER — PHENYLEPHRINE HCL-NACL 10-0.9 MG/250ML-% IV SOLN
INTRAVENOUS | Status: AC
  Filled 2020-04-28: qty 250

## 2020-04-28 MED ORDER — KETAMINE HCL 10 MG/ML IJ SOLN
INTRAMUSCULAR | Status: DC | PRN
  Administered 2020-04-28: 30 mg via INTRAVENOUS
  Administered 2020-04-28 (×2): 10 mg via INTRAVENOUS

## 2020-04-28 MED ORDER — HYDROMORPHONE HCL 1 MG/ML IJ SOLN: 0.2500 mg | INTRAMUSCULAR | Status: DC | PRN

## 2020-04-28 MED ORDER — ONDANSETRON HCL 4 MG/2ML IJ SOLN
INTRAMUSCULAR | Status: AC
  Filled 2020-04-28: qty 2

## 2020-04-28 MED ORDER — ONDANSETRON HCL 4 MG/2ML IJ SOLN: 4.0000 mg | Freq: Once | INTRAMUSCULAR | Status: DC | PRN

## 2020-04-28 MED ORDER — ORAL CARE MOUTH RINSE: 15.0000 mL | Freq: Once | OROMUCOSAL | Status: AC

## 2020-04-28 MED ORDER — BUPIVACAINE LIPOSOME 1.3 % IJ SUSP
20.0000 mL | Freq: Once | INTRAMUSCULAR | Status: DC
  Filled 2020-04-28: qty 20

## 2020-04-28 MED ORDER — LIDOCAINE 2% (20 MG/ML) 5 ML SYRINGE
INTRAMUSCULAR | Status: AC
  Filled 2020-04-28: qty 5

## 2020-04-28 MED ORDER — LACTATED RINGERS IV SOLN: INTRAVENOUS | Status: DC

## 2020-04-28 MED ORDER — ENOXAPARIN SODIUM 40 MG/0.4ML ~~LOC~~ SOLN
40.0000 mg | Freq: Every day | SUBCUTANEOUS | Status: DC
  Administered 2020-04-29 – 2020-05-03 (×5): 40 mg via SUBCUTANEOUS
  Filled 2020-04-28 (×5): qty 0.4

## 2020-04-28 MED ORDER — MIDAZOLAM HCL 2 MG/2ML IJ SOLN
INTRAMUSCULAR | Status: AC
  Filled 2020-04-28: qty 2

## 2020-04-28 MED ORDER — ACETAMINOPHEN 500 MG PO TABS
1000.0000 mg | ORAL_TABLET | Freq: Four times a day (QID) | ORAL | Status: AC
  Administered 2020-04-28 – 2020-05-03 (×19): 1000 mg via ORAL
  Filled 2020-04-28 (×19): qty 2

## 2020-04-28 MED ORDER — SODIUM CHLORIDE 0.9 % IR SOLN
Status: DC | PRN
  Administered 2020-04-28: 1000 mL

## 2020-04-28 MED ORDER — 0.9 % SODIUM CHLORIDE (POUR BTL) OPTIME
TOPICAL | Status: DC | PRN
  Administered 2020-04-28: 2000 mL

## 2020-04-28 MED ORDER — PROMETHAZINE HCL 25 MG/ML IJ SOLN: 6.2500 mg | INTRAMUSCULAR | Status: DC | PRN

## 2020-04-28 MED ORDER — TRAMADOL HCL 50 MG PO TABS
50.0000 mg | ORAL_TABLET | Freq: Four times a day (QID) | ORAL | Status: DC | PRN
  Administered 2020-04-28 – 2020-04-29 (×2): 100 mg via ORAL
  Filled 2020-04-28 (×2): qty 2

## 2020-04-28 MED ORDER — SUGAMMADEX SODIUM 200 MG/2ML IV SOLN
INTRAVENOUS | Status: DC | PRN
  Administered 2020-04-28: 200 mg via INTRAVENOUS

## 2020-04-28 MED ORDER — FENTANYL CITRATE (PF) 250 MCG/5ML IJ SOLN
INTRAMUSCULAR | Status: AC
  Filled 2020-04-28: qty 5

## 2020-04-28 SURGICAL SUPPLY — 148 items
ADH SKN CLS APL DERMABOND .7 (GAUZE/BANDAGES/DRESSINGS) ×1
APL PRP STRL LF DISP 70% ISPRP (MISCELLANEOUS) ×1
BAG SPEC RTRVL C125 8X14 (MISCELLANEOUS) ×1
BAG SPEC RTRVL C1550 15 (MISCELLANEOUS) ×1
BAG SPEC RTRVL LRG 6X4 10 (ENDOMECHANICALS)
BLADE CLIPPER SURG (BLADE) ×3 IMPLANT
BLADE SURG 11 STRL SS (BLADE) ×3 IMPLANT
BNDG COHESIVE 6X5 TAN STRL LF (GAUZE/BANDAGES/DRESSINGS) ×1 IMPLANT
CANISTER SUCT 3000ML PPV (MISCELLANEOUS) ×6 IMPLANT
CANNULA REDUC XI 12-8 STAPL (CANNULA) ×4
CANNULA REDUC XI 12-8MM STAPL (CANNULA) ×2
CANNULA REDUCER 12-8 DVNC XI (CANNULA) ×2 IMPLANT
CATH THORACIC 28FR (CATHETERS) ×2 IMPLANT
CATH THORACIC 28FR RT ANG (CATHETERS) IMPLANT
CATH THORACIC 36FR (CATHETERS) IMPLANT
CATH THORACIC 36FR RT ANG (CATHETERS) IMPLANT
CATH TROCAR 20FR (CATHETERS) IMPLANT
CHLORAPREP W/TINT 26 (MISCELLANEOUS) ×3 IMPLANT
CLIP VESOCCLUDE MED 6/CT (CLIP) IMPLANT
CNTNR URN SCR LID CUP LEK RST (MISCELLANEOUS) ×5 IMPLANT
CONN ST 1/4X3/8  BEN (MISCELLANEOUS)
CONN ST 1/4X3/8 BEN (MISCELLANEOUS) IMPLANT
CONN Y 3/8X3/8X3/8  BEN (MISCELLANEOUS)
CONN Y 3/8X3/8X3/8 BEN (MISCELLANEOUS) IMPLANT
CONT SPEC 4OZ STRL OR WHT (MISCELLANEOUS) ×54
COVER SURGICAL LIGHT HANDLE (MISCELLANEOUS) IMPLANT
DEFOGGER SCOPE WARMER CLEARIFY (MISCELLANEOUS) ×3 IMPLANT
DERMABOND ADVANCED (GAUZE/BANDAGES/DRESSINGS) ×2
DERMABOND ADVANCED .7 DNX12 (GAUZE/BANDAGES/DRESSINGS) ×1 IMPLANT
DISSECTOR BLUNT TIP ENDO 5MM (MISCELLANEOUS) IMPLANT
DRAIN CHANNEL 28F RND 3/8 FF (WOUND CARE) IMPLANT
DRAIN CHANNEL 32F RND 10.7 FF (WOUND CARE) IMPLANT
DRAPE ARM DVNC X/XI (DISPOSABLE) ×4 IMPLANT
DRAPE COLUMN DVNC XI (DISPOSABLE) ×1 IMPLANT
DRAPE CV SPLIT W-CLR ANES SCRN (DRAPES) ×3 IMPLANT
DRAPE DA VINCI XI ARM (DISPOSABLE) ×12
DRAPE DA VINCI XI COLUMN (DISPOSABLE) ×3
DRAPE ORTHO SPLIT 77X108 STRL (DRAPES) ×3
DRAPE SURG ORHT 6 SPLT 77X108 (DRAPES) ×1 IMPLANT
ELECT BLADE 6.5 EXT (BLADE) IMPLANT
ELECT REM PT RETURN 9FT ADLT (ELECTROSURGICAL) ×3
ELECTRODE REM PT RTRN 9FT ADLT (ELECTROSURGICAL) ×1 IMPLANT
GAUZE KITTNER 4X10 (MISCELLANEOUS) ×1 IMPLANT
GAUZE KITTNER 4X8 (MISCELLANEOUS) IMPLANT
GAUZE SPONGE 4X4 12PLY STRL (GAUZE/BANDAGES/DRESSINGS) ×3 IMPLANT
GLOVE BIO SURGEON STRL SZ 6.5 (GLOVE) ×3 IMPLANT
GLOVE BIO SURGEON STRL SZ7.5 (GLOVE) ×16 IMPLANT
GLOVE BIO SURGEONS STRL SZ 6.5 (GLOVE) ×3
GLOVE BIOGEL PI IND STRL 6 (GLOVE) IMPLANT
GLOVE BIOGEL PI IND STRL 6.5 (GLOVE) IMPLANT
GLOVE BIOGEL PI INDICATOR 6 (GLOVE) ×2
GLOVE BIOGEL PI INDICATOR 6.5 (GLOVE) ×8
GLOVE SURG SS PI 6.0 STRL IVOR (GLOVE) ×4 IMPLANT
GLOVE SURG SS PI 8.0 STRL IVOR (GLOVE) ×2 IMPLANT
GOWN STRL REUS W/ TWL LRG LVL3 (GOWN DISPOSABLE) ×2 IMPLANT
GOWN STRL REUS W/ TWL XL LVL3 (GOWN DISPOSABLE) ×3 IMPLANT
GOWN STRL REUS W/TWL 2XL LVL3 (GOWN DISPOSABLE) ×3 IMPLANT
GOWN STRL REUS W/TWL LRG LVL3 (GOWN DISPOSABLE) ×15
GOWN STRL REUS W/TWL XL LVL3 (GOWN DISPOSABLE) ×6
HEMOSTAT SURGICEL 2X14 (HEMOSTASIS) ×7 IMPLANT
IRRIGATION STRYKERFLOW (MISCELLANEOUS) ×1 IMPLANT
IRRIGATOR STRYKERFLOW (MISCELLANEOUS) ×3
KIT BASIN OR (CUSTOM PROCEDURE TRAY) ×3 IMPLANT
KIT SUCTION CATH 14FR (SUCTIONS) IMPLANT
KIT TURNOVER KIT B (KITS) ×3 IMPLANT
LOOP VESSEL SUPERMAXI WHITE (MISCELLANEOUS) IMPLANT
NDL HYPO 25GX1X1/2 BEV (NEEDLE) ×1 IMPLANT
NEEDLE 22X1 1/2 (OR ONLY) (NEEDLE) ×3 IMPLANT
NEEDLE HYPO 25GX1X1/2 BEV (NEEDLE) IMPLANT
NS IRRIG 1000ML POUR BTL (IV SOLUTION) ×9 IMPLANT
OBTURATOR OPTICAL STANDARD 8MM (TROCAR)
OBTURATOR OPTICAL STND 8 DVNC (TROCAR)
OBTURATOR OPTICALSTD 8 DVNC (TROCAR) IMPLANT
PACK CHEST (CUSTOM PROCEDURE TRAY) ×3 IMPLANT
PAD ARMBOARD 7.5X6 YLW CONV (MISCELLANEOUS) ×15 IMPLANT
PATTIES SURGICAL 3 X3 (GAUZE/BANDAGES/DRESSINGS) ×2
PATTIES SURGICAL 3X3 (GAUZE/BANDAGES/DRESSINGS) IMPLANT
POUCH ENDO CATCH II 15MM (MISCELLANEOUS) IMPLANT
POUCH SPECIMEN RETRIEVAL 10MM (ENDOMECHANICALS) IMPLANT
RELOAD STAPLE 45 2.5 WHT DVNC (STAPLE) IMPLANT
RELOAD STAPLE 45 3.5 BLU DVNC (STAPLE) IMPLANT
RELOAD STAPLE 45 4.3 GRN DVNC (STAPLE) IMPLANT
RELOAD STAPLE 45 4.6 BLK DVNC (STAPLE) IMPLANT
RELOAD STAPLER 2.5X45 WHT DVNC (STAPLE) ×3 IMPLANT
RELOAD STAPLER 3.5X45 BLU DVNC (STAPLE) ×8 IMPLANT
RELOAD STAPLER 4.3X45 GRN DVNC (STAPLE) ×4 IMPLANT
RELOAD STAPLER 45 4.6 BLK DVNC (STAPLE) ×4 IMPLANT
RETRACTOR WOUND ALXS 19CM XSML (INSTRUMENTS) IMPLANT
RTRCTR WOUND ALEXIS 19CM XSML (INSTRUMENTS)
SCISSORS LAP 5X35 DISP (ENDOMECHANICALS) IMPLANT
SEAL CANN UNIV 5-8 DVNC XI (MISCELLANEOUS) ×2 IMPLANT
SEAL XI 5MM-8MM UNIVERSAL (MISCELLANEOUS) ×6
SEALANT PROGEL (MISCELLANEOUS) IMPLANT
SEALANT SURG COSEAL 4ML (VASCULAR PRODUCTS) IMPLANT
SEALANT SURG COSEAL 8ML (VASCULAR PRODUCTS) IMPLANT
SEALER LIGASURE MARYLAND 30 (ELECTROSURGICAL) IMPLANT
SET TUBE SMOKE EVAC HIGH FLOW (TUBING) ×3 IMPLANT
SOLUTION ELECTROLUBE (MISCELLANEOUS) ×2 IMPLANT
SPONGE INTESTINAL PEANUT (DISPOSABLE) IMPLANT
SPONGE TONSIL TAPE 1 RFD (DISPOSABLE) IMPLANT
STAPLER 45 SUREFORM CVD (STAPLE) ×6
STAPLER 45 SUREFORM CVD DVNC (STAPLE) IMPLANT
STAPLER CANNULA SEAL DVNC XI (STAPLE) ×2 IMPLANT
STAPLER CANNULA SEAL XI (STAPLE) ×6
STAPLER RELOAD 2.5X45 WHITE (STAPLE) ×9
STAPLER RELOAD 2.5X45 WHT DVNC (STAPLE) ×3
STAPLER RELOAD 3.5X45 BLU DVNC (STAPLE) ×8
STAPLER RELOAD 3.5X45 BLUE (STAPLE) ×24
STAPLER RELOAD 4.3X45 GREEN (STAPLE) ×12
STAPLER RELOAD 4.3X45 GRN DVNC (STAPLE) ×4
STAPLER RELOAD 45 4.6 BLK (STAPLE) ×12
STAPLER RELOAD 45 4.6 BLK DVNC (STAPLE) ×4
STOPCOCK 4 WAY LG BORE MALE ST (IV SETS) ×3 IMPLANT
SUT MNCRL AB 3-0 PS2 18 (SUTURE) IMPLANT
SUT MON AB 2-0 CT1 36 (SUTURE) IMPLANT
SUT PDS AB 1 CTX 36 (SUTURE) IMPLANT
SUT PROLENE 4 0 RB 1 (SUTURE)
SUT PROLENE 4-0 RB1 .5 CRCL 36 (SUTURE) IMPLANT
SUT SILK  1 MH (SUTURE) ×6
SUT SILK 1 MH (SUTURE) ×1 IMPLANT
SUT SILK 1 TIES 10X30 (SUTURE) IMPLANT
SUT SILK 2 0 SH (SUTURE) IMPLANT
SUT SILK 2 0SH CR/8 30 (SUTURE) ×2 IMPLANT
SUT VIC AB 1 CTX 36 (SUTURE)
SUT VIC AB 1 CTX36XBRD ANBCTR (SUTURE) IMPLANT
SUT VIC AB 2-0 CT1 27 (SUTURE) ×6
SUT VIC AB 2-0 CT1 36 (SUTURE) ×2 IMPLANT
SUT VIC AB 2-0 CT1 TAPERPNT 27 (SUTURE) ×1 IMPLANT
SUT VIC AB 3-0 SH 27 (SUTURE) ×6
SUT VIC AB 3-0 SH 27X BRD (SUTURE) ×3 IMPLANT
SUT VICRYL 0 TIES 12 18 (SUTURE) ×3 IMPLANT
SUT VICRYL 0 UR6 27IN ABS (SUTURE) ×6 IMPLANT
SUT VICRYL 2 TP 1 (SUTURE) IMPLANT
SYR 10ML LL (SYRINGE) ×3 IMPLANT
SYR 20ML LL LF (SYRINGE) ×3 IMPLANT
SYR 50ML LL SCALE MARK (SYRINGE) ×3 IMPLANT
SYSTEM RETRIEVAL ANCHOR 15 (MISCELLANEOUS) ×2 IMPLANT
SYSTEM RETRIEVAL ANCHOR 8 (MISCELLANEOUS) ×2 IMPLANT
SYSTEM SAHARA CHEST DRAIN ATS (WOUND CARE) ×3 IMPLANT
TAPE CLOTH 4X10 WHT NS (GAUZE/BANDAGES/DRESSINGS) ×3 IMPLANT
TAPE CLOTH SURG 4X10 WHT LF (GAUZE/BANDAGES/DRESSINGS) ×2 IMPLANT
TIP APPLICATOR SPRAY EXTEND 16 (VASCULAR PRODUCTS) IMPLANT
TOWEL GREEN STERILE (TOWEL DISPOSABLE) ×3 IMPLANT
TRAY FOLEY MTR SLVR 16FR STAT (SET/KITS/TRAYS/PACK) ×3 IMPLANT
TROCAR BLADELESS 15MM (ENDOMECHANICALS) IMPLANT
TROCAR XCEL 12X100 BLDLESS (ENDOMECHANICALS) ×3 IMPLANT
TUBING EXTENTION W/L.L. (IV SETS) ×3 IMPLANT
WATER STERILE IRR 1000ML POUR (IV SOLUTION) ×3 IMPLANT

## 2020-04-28 NOTE — Anesthesia Procedure Notes (Signed)
Procedure Name: Intubation Date/Time: 04/28/2020 8:45 AM Performed by: Milford Cage, CRNA Pre-anesthesia Checklist: Patient identified, Emergency Drugs available, Suction available and Patient being monitored Patient Re-evaluated:Patient Re-evaluated prior to induction Oxygen Delivery Method: Circle system utilized Preoxygenation: Pre-oxygenation with 100% oxygen Induction Type: IV induction Ventilation: Mask ventilation without difficulty and Oral airway inserted - appropriate to patient size Laryngoscope Size: Miller and 3 Grade View: Grade I Tube type: Oral Endobronchial tube: Double lumen EBT, EBT position confirmed by auscultation and EBT position confirmed by fiberoptic bronchoscope and 39 Fr Number of attempts: 1 Airway Equipment and Method: Stylet and Oral airway Placement Confirmation: ETT inserted through vocal cords under direct vision,  positive ETCO2 and breath sounds checked- equal and bilateral Secured at: 31 cm Tube secured with: Tape Dental Injury: Teeth and Oropharynx as per pre-operative assessment  Comments: Inserted by Edmund Hilda, SRNA under direct supervision.

## 2020-04-28 NOTE — Anesthesia Postprocedure Evaluation (Signed)
Anesthesia Post Note  Patient: [name] [name]  Procedure(s) Performed: XI ROBOTIC ASSISTED THORASCOPY-RIGHT LOWER LOBE WEDGE RESECTION (Right Chest) XI ROBOTIC ASSISTED THORASCOPY-LOBECTOMY (Right Chest) INTERCOSTAL NERVE BLOCK (Right Chest) NODE DISSECTION (Right Chest)     Patient location during evaluation: PACU Anesthesia Type: General Level of consciousness: awake and alert Pain management: pain level controlled Vital Signs Assessment: post-procedure vital signs reviewed and stable Respiratory status: spontaneous breathing, nonlabored ventilation, respiratory function stable and patient connected to nasal cannula oxygen Cardiovascular status: blood pressure returned to baseline and stable Postop Assessment: no apparent nausea or vomiting Anesthetic complications: no   No complications documented.  Last Vitals:  Vitals:   04/28/20 1345 04/28/20 1405  BP: 138/78   Pulse: 96   Resp: (!) 22   Temp:  36.6 C  SpO2: 92%     Last Pain:  Vitals:   04/28/20 1451  TempSrc:   PainSc: 2                  Lilyannah Zuelke S

## 2020-04-28 NOTE — Progress Notes (Signed)
Still complaining of eye irritation bilaterally, eyes a little bit reddened. PA made aware.Eye drops given. Continue to monitor.

## 2020-04-28 NOTE — Transfer of Care (Signed)
Immediate Anesthesia Transfer of Care Note  Patient: [name] [name]  Procedure(s) Performed: XI ROBOTIC ASSISTED THORASCOPY-RIGHT LOWER LOBE WEDGE RESECTION (Right Chest) XI ROBOTIC ASSISTED THORASCOPY-LOBECTOMY (Right Chest) INTERCOSTAL NERVE BLOCK (Right Chest) NODE DISSECTION (Right Chest)  Patient Location: PACU  Anesthesia Type:General  Level of Consciousness: drowsy, patient cooperative and responds to stimulation  Airway & Oxygen Therapy: Patient Spontanous Breathing and Patient connected to face mask oxygen  Post-op Assessment: Report given to RN and Post -op Vital signs reviewed and stable  Post vital signs: Reviewed and stable  Last Vitals:  Vitals Value Taken Time  BP 136/78 04/28/20 1246  Temp    Pulse 94 04/28/20 1249  Resp 22 04/28/20 1249  SpO2 97 % 04/28/20 1249  Vitals shown include unvalidated device data.  Last Pain:  Vitals:   04/28/20 0721  TempSrc: Oral  PainSc: 2          Complications: No complications documented.

## 2020-04-28 NOTE — Op Note (Signed)
      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 Procedure: - Robotic assisted right video thoracoscopy -Right lower lobe wedge resection -Right lower completion lobectomy - Mediastinal lymph node sampling - Intercostal nerve block  Surgeon and Role:      * Elisavet Buehrer, Lucile Crater, MD - Primary    *T. Harriet Pho, PA-C- assisting  Anesthesia  general EBL: 150 ml Blood Administration: None Specimen: Right lower lobe wedge resection, right lower lobe, hilar lymph nodes, level 7, level 9, and level 4 lymph nodes  Drains: 21 F argyle chest tube in right chest Counts: correct   Indications:  69 year old male with a 3.1 cm right lower lobe pulmonary mass very concerning for primary lung cancer. Although he is undergone nondiagnostic navigational bronchoscopy, the PET/CT does show increased uptake. We discussed the options for a right robotic assisted wedge resection and possible completion lobectomy and he is agreeable to proceed on August 18.  Findings: Right lower lobe mass was evident.  The pathology revealed a mucinous adenocarcinoma.  Normal anatomy.  Operative Technique: After the risks, benefits and alternatives were thoroughly discussed, the patient was brought to the operative theatre.  Anesthesia was induced, and the patient was then placed in a right lateral decubitus position and was prepped and draped in normal sterile fashion.  An appropriate surgical pause was performed, and pre-operative antibiotics were dosed accordingly.  We began by placing our 4 robotic ports in the the 7th intercostal space targeting the hilum of the lung.  A 67mm assistant port was placed in the 9th intercostal space in the anterior axillary line.  The robot was then docked and all instruments were passed under direct  visualization.    The lung was then retracted superiorly, and the inferior pulmonary ligament was divided.  The hilum was mobilized anteriorly and posteriorly.  We identified the lower lobe vein, and after careful isolation, it was divided with a vascular stapler.  We next moved to the fissure.  The artery was then divided with a vascular load stapler.  The bronchus to the lower lobe was then isolated.  After a test clamp, with good ventilation of the upper and middle lobes, the bronchus was then divided.  The fissure was completed, and the specimen was passed into an endocatch bag.  It was removed from the superior access site.    Lymph nodes were then sampled at levels 4, 7, and 9.  The chest was irrigated, and an air leak test was performed.  An intercostal nerve block was performed under direct visualization.  A 28 F chest with then placed, and we watch the remaining lobes re-expand.  The skin and soft tissue were closed with absorbable suture    The patient tolerated the procedure without any immediate complications, and was transferred to the PACU in stable condition.  [name]

## 2020-04-28 NOTE — Progress Notes (Signed)
PA made aware about pain  And slight puffiness on both eyes. Cold compress applied. Continue to monitor.

## 2020-04-28 NOTE — Progress Notes (Signed)
Placed  Chest tube on -20cm suction as ordered.

## 2020-04-28 NOTE — Anesthesia Procedure Notes (Addendum)
Arterial Line Insertion Start/End8/18/2021 8:00 AM Performed by: Milford Cage, CRNA, CRNA  Patient location: Pre-op. Preanesthetic checklist: patient identified, IV checked, site marked, risks and benefits discussed, surgical consent, monitors and equipment checked, pre-op evaluation, timeout performed and anesthesia consent Lidocaine 1% used for infiltration and patient sedated Left, radial was placed Catheter size: 20 G Hand hygiene performed , maximum sterile barriers used  and Seldinger technique used  Attempts: 1 Procedure performed without using ultrasound guided technique. Following insertion, dressing applied and Biopatch. Post procedure assessment: normal  Patient tolerated the procedure well with no immediate complications. Additional procedure comments: Inserted by Edmund Hilda, SRNA under direct supervision. Marland Kitchen

## 2020-04-28 NOTE — Brief Op Note (Signed)
      [address]       [place] [address]             [phone]        04/28/2020  12:37 PM  PATIENT:  [name]  69 y.o. male  PRE-OPERATIVE DIAGNOSIS:  RIGHT PULMONARY MASS  POST-OPERATIVE DIAGNOSIS:  RIGHT PULMONARY MASS  PROCEDURE:  Procedure(s): XI ROBOTIC ASSISTED THORASCOPY-RIGHT LOWER LOBE WEDGE RESECTION (Right) XI ROBOTIC ASSISTED THORASCOPY-LOBECTOMY (Right) INTERCOSTAL NERVE BLOCK (Right) NODE DISSECTION (Right)  SURGEON:  Surgeon(s) and Role:    * Lightfoot, Lucile Crater, MD - Primary  PHYSICIAN ASSISTANT:   Nicholes Rough, PA-C   ANESTHESIA:   general  EBL:  150 mL   BLOOD ADMINISTERED:none  DRAINS: routine   LOCAL MEDICATIONS USED:  BUPIVICAINE   SPECIMEN:  Source of Specimen:  right lower lobe wedge, right lower lobe  DISPOSITION OF SPECIMEN:  PATHOLOGY  COUNTS:  YES  DICTATION: .Dragon Dictation  PLAN OF CARE: Admit to inpatient   PATIENT DISPOSITION:  PACU - hemodynamically stable.   Delay start of Pharmacological VTE agent (>24hrs) due to surgical blood loss or risk of bleeding: yes

## 2020-04-28 NOTE — Anesthesia Preprocedure Evaluation (Signed)
Anesthesia Evaluation  Patient identified by MRN, date of birth, ID band Patient awake    Reviewed: Allergy & Precautions, NPO status , Patient's Chart, lab work & pertinent test results  Airway Mallampati: II  TM Distance: <3 FB Neck ROM: Full    Dental no notable dental hx.    Pulmonary shortness of breath, former smoker,    Pulmonary exam normal breath sounds clear to auscultation       Cardiovascular negative cardio ROS Normal cardiovascular exam Rhythm:Regular Rate:Normal     Neuro/Psych negative psych ROS   GI/Hepatic Neg liver ROS, GERD  ,  Endo/Other  negative endocrine ROS  Renal/GU negative Renal ROS  negative genitourinary   Musculoskeletal negative musculoskeletal ROS (+)   Abdominal   Peds negative pediatric ROS (+)  Hematology negative hematology ROS (+)   Anesthesia Other Findings   Reproductive/Obstetrics negative OB ROS                             Anesthesia Physical Anesthesia Plan  ASA: III  Anesthesia Plan: General   Post-op Pain Management:    Induction: Intravenous  PONV Risk Score and Plan: 2 and Ondansetron and Dexamethasone  Airway Management Planned: Double Lumen EBT  Additional Equipment: Arterial line  Intra-op Plan:   Post-operative Plan: Extubation in OR  Informed Consent: I have reviewed the patients History and Physical, chart, labs and discussed the procedure including the risks, benefits and alternatives for the proposed anesthesia with the patient or authorized representative who has indicated his/her understanding and acceptance.     Dental advisory given  Plan Discussed with: CRNA and Surgeon  Anesthesia Plan Comments:         Anesthesia Quick Evaluation

## 2020-04-28 NOTE — Plan of Care (Signed)
  Problem: Education: Goal: Knowledge of disease or condition will improve Outcome: Progressing Goal: Knowledge of the prescribed therapeutic regimen will improve Outcome: Progressing   Problem: Activity: Goal: Risk for activity intolerance will decrease Outcome: Progressing   Problem: Cardiac: Goal: Will achieve and/or maintain hemodynamic stability Outcome: Progressing   Problem: Respiratory: Goal: Respiratory status will improve Outcome: Progressing   Problem: Clinical Measurements: Goal: Ability to maintain clinical measurements within normal limits will improve Outcome: Progressing

## 2020-04-28 NOTE — Progress Notes (Signed)
DR. Owens Shark called in the result of portable chest x-ray -right large pneumothorax. Right chest tube in right position. Relayed to PA with no order.

## 2020-04-28 NOTE — Progress Notes (Signed)
Transferred-in from PACU moaning for pain on the surgical site. MOrphine given. Went to sleep after 30 min.

## 2020-04-28 NOTE — H&P (Signed)
[address]       [place] [address]             [phone]       No events since his last clinic appointment  OR today for Right RATS, wedge resection of the RLL, possible lobectomy.  Per my last clinic note  [place] Record #[id number] Date of Birth: 01-Jan-1951  Referring: Collene Gobble, MD Primary Care: Gaynelle Arabian, MD Primary Cardiologist: No primary care provider on file.  Chief Complaint:        Chief Complaint  Patient presents with  . Lung Lesion    Surgical eval, PFT's 04/02/20, PET Scan 03/10/20, Chest CT 02/13/20    History of Present Illness:    [name] [name] 69 y.o. male referred by Dr. Lamonte Sakai for surgical evaluation of a right lower lobe pulmonary mass.  This was found incidentally when he was undergoing evaluation for coronary disease.  He denies any weight changes, or neurologic symptoms.  He occasionally has some shortness of breath when going up 2 flights of stairs.  On his most recent stress test, the results were intermediate and risk and that he had a small area of distal LAD ischemia.  Medical management was recommended.  In regards to the pulmonary nodule he is undergone a PET/CT which showed increased avidity as well as a navigational bronchoscopy which was inconclusive.  He has been referred to CTS to discuss surgical options for diagnosis and potential treatment.    Smoking Hx: Quit tobacco use in 2013.   Zubrod Score: At the time of surgery this patient's most appropriate activity status/level should be described as: [x] ?    0    Normal activity, no symptoms [] ?    1    Restricted in physical strenuous activity but ambulatory, able to do out light work [] ?    2    Ambulatory and capable of self care, unable to do work activities, up and about               >50 % of waking hours                              [] ?    3    Only limited self care, in bed greater than 50% of waking  hours [] ?    4    Completely disabled, no self care, confined to bed or chair [] ?    5    Moribund       Past Medical History:  Diagnosis Date  . Arthritis   . Carpal tunnel syndrome    Right  . Cataracts, bilateral    very mild  . Early cataracts, bilateral   . Fatty liver   . Gallstones   . Headache 2011   HX OF VISUAL MIGRAINES - NO PAIN --MIGRAINES ATTRIBUTED TO PT CUTTING NICOTINE PATCH IN HALF BEFORE APPLYING TO HIS SKIN.    Marland Kitchen Hepatitis 2001   C -treated and no problems since  2001 'cured'  . HOH (hard of hearing)   . Muscle cramps    hands and legs  . Osteopenia   . Pain    KNEE OA  . Right lower lobe lung mass   . Shortness of breath dyspnea    "SOMETIMES FEELS LIKE I CAN'T CATCH A DEEP BREATH "- ON GOING FOR YEARS - [place]  HIM FOR CARDIAC WORK UP 2011 - [place]  . Stroke ([place])    HX POSSIBLE TIA - RT LEG NUMBNESS AND TINGLING FINGERS AND NUMBNESS RT FACE - ALL RESOLVED ; STATES NEUROLOGIST COULD NOT FIND ANY EVIDENCE OF STROKE - THOUGHT PT'S EPISODE MIGHT BE FROM CUTTING NICOTINE PATCH IN HALF AND APPLYING TO SKIN  . Trigger finger    LEFT MIDDLE FINGER  . Wears glasses          Past Surgical History:  Procedure Laterality Date  . BRONCHIAL BIOPSY  02/24/2020   Procedure: BRONCHIAL BIOPSIES;  Surgeon: Collene Gobble, MD;  Location: [facility] ENDOSCOPY;  Service: Pulmonary;;  . BRONCHIAL BRUSHINGS  02/24/2020   Procedure: BRONCHIAL BRUSHINGS;  Surgeon: Collene Gobble, MD;  Location: [facility] ENDOSCOPY;  Service: Pulmonary;;  . BRONCHIAL NEEDLE ASPIRATION BIOPSY  02/24/2020   Procedure: BRONCHIAL NEEDLE ASPIRATION BIOPSIES;  Surgeon: Collene Gobble, MD;  Location: [facility] ENDOSCOPY;  Service: Pulmonary;;  . BRONCHIAL WASHINGS  02/24/2020   Procedure: BRONCHIAL WASHINGS;  Surgeon: Collene Gobble, MD;  Location: [facility] ENDOSCOPY;  Service: Pulmonary;;  . COLONOSCOPY    . left ankle surgery     fractured  . left  knee surgery     MENISCAL REMOVED  . left shoulder     torn ligaments left shoulder  . LUMBAR FUSION     L3 L4 L5  . RIGHT CARPAL TUNNEL RELEASE  2011  . RIGHT KNEE ARTHROSCOPY   AUG OR SEPT 2015   AT [facility]  . ROOT CANAL    . TONSILLECTOMY     as child  . 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 TOTAL KNEE ARTHROPLASTY Right 07/21/2014   Procedure: RIGHT TOTAL KNEE ARTHROPLASTY;  Surgeon: Mauri Pole, MD;  Location: [facility] ORS;  Service: Orthopedics;  Laterality: Right;  . TOTAL SHOULDER ARTHROPLASTY Right 10/17/2019   Procedure: RIGHT REVERSE TOTAL SHOULDER REPLACEMENT;  Surgeon: Netta Cedars, MD;  Location: [facility] ORS;  Service: Orthopedics;  Laterality: Right;  interscalene block  . TRIGGER FINGER RELEASE Left   . VIDEO BRONCHOSCOPY WITH ENDOBRONCHIAL NAVIGATION Right 02/24/2020   Procedure: VIDEO BRONCHOSCOPY WITH ENDOBRONCHIAL NAVIGATION;  Surgeon: Collene Gobble, MD;  Location: [facility] ENDOSCOPY;  Service: Pulmonary;  Laterality: Right;         Family History  Problem Relation Age of Onset  . Alcohol abuse Father   . Hyperlipidemia Father   . Heart attack Brother 54  . Hyperlipidemia Brother   . Hyperlipidemia Sister        3 sisters  . Heart attack Brother   . Hyperlipidemia Brother      Social History       Tobacco Use  Smoking Status Former Smoker  . Quit date: 04/30/2012  . Years since quitting: 7.9  Smokeless Tobacco Never Used    Social History       Substance and Sexual Activity  Alcohol Use Not Currently  . Alcohol/week: 7.0 standard drinks  . Types: 7 Shots of liquor per week          Allergies  Allergen Reactions  . Statins Other (See Comments)    MYALGIAS          Current Outpatient Medications  Medication Sig Dispense Refill  . Alirocumab (PRALUENT)  150 MG/ML SOAJ Inject 1 Dose into the skin  every 14 (fourteen) days. 2 pen 11  . clindamycin (CLEOCIN) 150 MG capsule Take 150-300 mg by mouth See admin instructions. 300 mg 1 hour before Dental appt, 150 mg 6 hours after appt    . cyclobenzaprine (FLEXERIL) 10 MG tablet Take 10 mg by mouth 3 (three) times daily as needed for muscle spasms.    Marland Kitchen gentamicin cream (GARAMYCIN) 0.1 % Apply 1 application topically 2 (two) times daily as needed (irritation).    Marland Kitchen ibuprofen (ADVIL,MOTRIN) 200 MG tablet Take 400-600 mg by mouth every 4 (four) hours as needed for headache or mild pain (for pain.).     . Magnesium 500 MG TABS Take 500 mg by mouth in the morning, at noon, and at bedtime.      No current facility-administered medications for this visit.    Review of Systems  Constitutional: Negative.  Negative for malaise/fatigue and weight loss.  Respiratory: Positive for shortness of breath.   Cardiovascular: Positive for chest pain.  Musculoskeletal: Positive for joint pain.  Neurological: Negative.      PHYSICAL EXAMINATION: BP (!) 145/76   Pulse 90   Temp 97.7 F (36.5 C) (Skin)   Resp 20   Ht 6\' 1"  (1.854 m)   Wt (!) 215 lb (97.5 kg)   SpO2 96% Comment: RA  BMI 28.37 kg/m  Physical Exam Constitutional:      Appearance: Normal appearance.  HENT:     Head: Normocephalic and atraumatic.  Eyes:     Extraocular Movements: Extraocular movements intact.     Conjunctiva/sclera: Conjunctivae normal.  Cardiovascular:     Rate and Rhythm: Normal rate.  Pulmonary:     Effort: Pulmonary effort is normal. No respiratory distress.  Abdominal:     General: There is no distension.  Musculoskeletal:        General: Normal range of motion.     Cervical back: Normal range of motion.  Skin:    General: Skin is warm and dry.  Neurological:     General: No focal deficit present.     Mental Status: He is alert and oriented to person, place, and time.     Diagnostic Studies &  Laboratory data:     Recent Radiology Findings:    Imaging Results  No results found.       I have independently reviewed the above radiology studies  and reviewed the findings with the patient.   Recent Lab Findings: Recent Labs        Lab Results  Component Value Date   WBC 6.3 02/17/2020   HGB 15.1 02/17/2020   HCT 43.9 02/17/2020   PLT 193.0 02/17/2020   GLUCOSE 213 (H) 02/17/2020   CHOL  10/31/2007    173        ATP III CLASSIFICATION:  <200     mg/dL   Desirable  200-239  mg/dL   Borderline High  >=240    mg/dL   High   TRIG 129 10/31/2007   HDL 31 (L) 10/31/2007   LDLCALC (H) 10/31/2007    116        Total Cholesterol/HDL:CHD Risk Coronary Heart Disease Risk Table                     Men   Women  1/2 Average Risk   3.4   3.3   ALT 27 02/17/2020   AST 21 02/17/2020   NA 133 (L) 02/17/2020   K 4.1 02/17/2020   CL  101 02/17/2020   CREATININE 1.01 02/17/2020   BUN 25 (H) 02/17/2020   CO2 27 02/17/2020   INR 1.1 (H) 02/17/2020       PFTs: - FVC: 99% - FEV1: 104% -DLCO: 82%  Problem List: 3.1 cm right lower lobe pulmonary mass with SUV of 5. Intermediate risk stress test with concern for LAD disease distally.   Assessment / Plan:   69 year old male with a 3.1 cm right lower lobe pulmonary mass very concerning for primary lung cancer.  Although he is undergone nondiagnostic navigational bronchoscopy, the PET/CT does show increased uptake.  We discussed the options for a right robotic assisted wedge resection and possible completion lobectomy and he is agreeable to proceed on August 18.  I will touch base with his cardiologist to ensure that no further work-up is required given his intermediate risk stress test.  I will be imperative to perform the Exparel at the beginning of the procedure just in case.  Also he states that tramadol is not a good medication for him, and will require something a little bit stronger.

## 2020-04-29 ENCOUNTER — Inpatient Hospital Stay (HOSPITAL_COMMUNITY): Payer: Medicare Other

## 2020-04-29 ENCOUNTER — Encounter (HOSPITAL_COMMUNITY): Payer: Self-pay | Admitting: Thoracic Surgery (Cardiothoracic Vascular Surgery)

## 2020-04-29 LAB — CBC
HCT: 42 % (ref 39.0–52.0)
Hemoglobin: 14 g/dL (ref 13.0–17.0)
MCH: 30.8 pg (ref 26.0–34.0)
MCHC: 33.3 g/dL (ref 30.0–36.0)
MCV: 92.5 fL (ref 80.0–100.0)
Platelets: 200 10*3/uL (ref 150–400)
RBC: 4.54 MIL/uL (ref 4.22–5.81)
RDW: 11.9 % (ref 11.5–15.5)
WBC: 15 10*3/uL — ABNORMAL HIGH (ref 4.0–10.5)
nRBC: 0 % (ref 0.0–0.2)

## 2020-04-29 LAB — BLOOD GAS, ARTERIAL
Acid-Base Excess: 0.7 mmol/L (ref 0.0–2.0)
Bicarbonate: 25.2 mmol/L (ref 20.0–28.0)
FIO2: 28
O2 Saturation: 98.2 %
Patient temperature: 37
pCO2 arterial: 43.2 mmHg (ref 32.0–48.0)
pH, Arterial: 7.384 (ref 7.350–7.450)
pO2, Arterial: 105 mmHg (ref 83.0–108.0)

## 2020-04-29 LAB — BASIC METABOLIC PANEL
Anion gap: 8 (ref 5–15)
BUN: 17 mg/dL (ref 8–23)
CO2: 24 mmol/L (ref 22–32)
Calcium: 8.7 mg/dL — ABNORMAL LOW (ref 8.9–10.3)
Chloride: 104 mmol/L (ref 98–111)
Creatinine, Ser: 0.94 mg/dL (ref 0.61–1.24)
GFR calc Af Amer: >60 mL/min (ref >60)
GFR calc non Af Amer: >60 mL/min (ref >60)
Glucose, Bld: 131 mg/dL — ABNORMAL HIGH (ref 70–99)
Potassium: 3.9 mmol/L (ref 3.5–5.1)
Sodium: 136 mmol/L (ref 135–145)

## 2020-04-29 LAB — GLUCOSE, CAPILLARY
Glucose-Capillary: 130 mg/dL — ABNORMAL HIGH (ref 70–99)
Glucose-Capillary: 180 mg/dL — ABNORMAL HIGH (ref 70–99)
Glucose-Capillary: 81 mg/dL (ref 70–99)
Glucose-Capillary: 131 mg/dL — ABNORMAL HIGH (ref 70–99)

## 2020-04-29 MED ORDER — OXYCODONE-ACETAMINOPHEN 5-325 MG PO TABS
1.0000 | ORAL_TABLET | ORAL | Status: DC | PRN
  Administered 2020-04-29 – 2020-05-02 (×10): 1 via ORAL
  Filled 2020-04-29 (×12): qty 1

## 2020-04-29 NOTE — Progress Notes (Addendum)
      [address]       [place] [address]             [phone]      1 Day Post-Op Procedure(s) (LRB): XI ROBOTIC ASSISTED THORASCOPY-RIGHT LOWER LOBE WEDGE RESECTION (Right) XI ROBOTIC ASSISTED THORASCOPY-LOBECTOMY (Right) INTERCOSTAL NERVE BLOCK (Right) NODE DISSECTION (Right) Subjective: Up in the bedside chair, ate a full breakfast tray. Pain control reasonable with morphine and Toradol but says tramadol has no effect for him.   Objective: Vital signs in last 24 hours: Temp:  [97.4 F (36.3 C)-98.8 F (37.1 C)] 98.4 F (36.9 C) (08/19 0739) Pulse Rate:  [86-98] 92 (08/19 0739) Cardiac Rhythm: Normal sinus rhythm (08/19 0354) Resp:  [12-22] 20 (08/19 0739) BP: (123-146)/(73-89) 124/76 (08/19 0739) SpO2:  [90 %-99 %] 96 % (08/19 0739) Arterial Line BP: (131-193)/(66-117) 193/117 (08/18 2135)     Intake/Output from previous day: 08/18 0701 - 08/19 0700 In: 1620 [P.O.:120; I.V.:1300; IV Piggyback:200] Out: 1630 [Urine:1080; Blood:150; Chest Tube:400] Intake/Output this shift: No intake/output data recorded.  General appearance: alert, cooperative and moderate distress Neurologic: intact Heart: regular rate and rhythm Lungs: Breath sounds are clear but diminished on the right. CXR shows incomplete expansion of the remaining right lung. There is an active air leak. Abdomen: firm, non-tender. Wound: Right chest dressing dry.   Lab Results: Recent Labs    04/26/20 0935 04/29/20 0354  WBC 7.4 15.0*  HGB 14.8 14.0  HCT 46.0 42.0  PLT 191 200   BMET:  Recent Labs    04/26/20 0935 04/29/20 0354  NA 138 136  K 4.1 3.9  CL 109 104  CO2 20* 24  GLUCOSE 120* 131*  BUN 18 17  CREATININE 0.90 0.94  CALCIUM 9.1 8.7*    PT/INR:  Recent Labs    04/26/20 0935  LABPROT 13.7  INR 1.1   ABG    Component Value Date/Time   PHART 7.384 04/29/2020 0349   HCO3 25.2 04/29/2020 0349   ACIDBASEDEF 1.6 04/26/2020 1000   O2SAT 98.2 04/29/2020  0349   CBG (last 3)  Recent Labs    04/28/20 1534 04/28/20 2013 04/29/20 0640  GLUCAP 158* 160* 130*    Assessment/Plan: S/P Procedure(s) (LRB): XI ROBOTIC ASSISTED THORASCOPY-RIGHT LOWER LOBE WEDGE RESECTION (Right) XI ROBOTIC ASSISTED THORASCOPY-LOBECTOMY (Right) INTERCOSTAL NERVE BLOCK (Right) NODE DISSECTION (Right)  -POD-1 right robotic assisted right lower lobectomy for adenocarcinoma. Has right PTX and active air leak with right chest tube on water seal. Will place to suction. CXR in AM.  -DVT PPX- on enoxaparin   LOS: 1 day    [name] Odea, PA-C [phone] 04/29/2020  Agree with above. Chest x-ray shows that the lung is not fully expanded. Additionally he has an air leak on exam. We will place chest tube to suction Continue pulmonary toilet  Wallace Cogliano O Azaiah Licciardi

## 2020-04-29 NOTE — Hospital Course (Addendum)
On postoperative day #1 patient was noted to be afebrile and hemodynamically stable.  He remained in normal sinus rhythm.  Oxygen saturations were acceptable on 3 L nasal cannula and we are weaning per usual protocols using standard incentive spirometry and pulmonary toilet measures.  He is noted to have a small pneumothorax with a air leak in chest tube was kept to suction.  His chest tube continued to exhibit an air leak.  Despite this his chest xray remained stable with trace apical pneumothorax.  He was placed to water seal on 07/01/2020.  Follow up CXR showed a slight increase in his apical pneumothorax.  His chest tube was left to water seal for additional 24 hours.  His CXR remained stable.  It was felt this chest tube could be transitioned to a Mini Express.  This was done on 05/03/2020.  He is ambulating independently.  His surgical incisions are healing without difficulty.  He is medically stable for discharge home today.

## 2020-04-29 NOTE — Discharge Summary (Signed)
Physician Discharge Summary  Patient ID: [name] [name] MRN: [id number] DOB/AGE: 1951/04/01 69 y.o.  Admit date: 04/28/2020 Discharge date: 05/03/2020  Admission Diagnoses: Right lower lobe lung mass  Discharge Diagnoses:  Active Problems:   S/P robot-assisted surgical procedure   Patient Active Problem List   Diagnosis Date Noted  . S/P robot-assisted surgical procedure 04/28/2020  . Right lower lobe lung mass 02/18/2020  . S/P shoulder replacement, right 10/17/2019  . Pseudoarthrosis of lumbar spine 09/07/2017  . Spondylolisthesis of lumbar region 03/19/2017  . Contusion of right hand 01/31/2016  . S/P right TKA 07/21/2014  . Overweight (BMI 25.0-29.9) 07/15/2013     History of Present Illness:at time of surgical consult [name] [name] y.o.malereferred by Dr. Lamonte Sakai for surgical evaluation of a right lower lobe pulmonary mass. This was found incidentally when he was undergoing evaluation for coronary disease. He denies any weight changes, or neurologic symptoms. He occasionally has some shortness of breath when going up 2 flights of stairs. On his most recent stress test, the results were intermediate and risk and that he had a small area of distal LAD ischemia. Medical management was recommended.  In regards to the pulmonary nodule he is undergone a PET/CT which showed increased avidity as well as a navigational bronchoscopy which was inconclusive. He has been referred to CTS to discuss surgical options for diagnosis and potential treatment.   Smoking Hx: Quit tobacco use in 2013.          The patient and his studies were evaluated by Dr. Kipp Brood who recommended proceeding with robotic assisted thoracoscopic surgical resection and he was admitted this hospitalization electively for the procedure.   Discharged Condition: good  Hospital Course: Patient was admitted electively and on 04/28/2020 taken to the operating room where he underwent the below  described procedure.  He tolerated it well and was taken to the postanesthesia care unit in stable condition.  Postoperative hospital course:  On postoperative day #1 patient was noted to be afebrile and hemodynamically stable.  He remained in normal sinus rhythm.  Oxygen saturations were acceptable on 3 L nasal cannula and we are weaning per usual protocols using standard incentive spirometry and pulmonary toilet measures.  He is noted to have a small pneumothorax with a air leak in chest tube was kept to suction.    Update: His chest tube continued to exhibit an air leak.  Despite this his chest xray remained stable with trace apical pneumothorax.  He was placed to water seal on 07/01/2020.  Follow up CXR showed a slight increase in his apical pneumothorax.  His chest tube was left to water seal for additional 24 hours.  His CXR remained stable.  It was felt this chest tube could be transitioned to a Mini Express.  This was done on 05/03/2020.  He is ambulating independently.  His surgical incisions are healing without difficulty.  He is medically stable for discharge home today.  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 Procedure: - Robotic assisted right video thoracoscopy -Right lower lobe wedge resection -Right lower completion lobectomy - Mediastinal lymph node sampling - Intercostal nerve block  Surgeon and Role:      * Lightfoot, Lucile Crater, MD - Primary    *T. Harriet Pho, PA-C- assisting  Anesthesia  general EBL: 150 ml Blood Administration: None Specimen: Right  lower lobe wedge resection, right lower lobe, hilar lymph nodes, level 7, level 9, and level 4 lymph nodes  Drains: 54 F argyle chest tube in right chest Counts: correct  Discharge Exam: Blood pressure 113/81, pulse 99,  temperature 97.7 F (36.5 C), temperature source Oral, resp. rate 18, height 6\' 1"  (1.854 m), weight 96.2 kg, SpO2 94 %.  General appearance: alert, cooperative and no distress Heart: regular rate and rhythm, S1, S2 normal, no murmur, click, rub or gallop Lungs: clear to auscultation bilaterally Abdomen: soft, non-tender; bowel sounds normal; no masses,  no organomegaly Extremities: extremities normal, atraumatic, no cyanosis or edema Wound: clean and dry  PATHOLOGY :   A. LUNG, RIGHT LOWER LOBE, WEDGE RESECTION:  - Mucinous adenocarcinoma of lung, 8 cm  - Carcinoma invades into the visceral pleural surface  - Resection margins negative for carcinoma  - Negative for lymphovascular invasion  - See oncology table   B. LUNG, RIGHT LOWER LOBE, LOBECTOMY:  - Lung lobe and bronchovascular margin, negative for carcinoma  - Two benign lymph nodes (0/2)   C. LYMPH NODE, LEVEL 7 #1, EXCISION:  - Fibroadipose tissue, negative for carcinoma  - Lymphoid tissue is not identified   D. LYMPH NODE, LEVEL 7 #2, EXCISION:  - Lymph node, negative for carcinoma (0/1)   E. LYMPH NODE, LEVEL 7 #3, EXCISION:  - Lymph node, negative for carcinoma (0/1)   F. LYMPH NODE, LEVEL 9, EXCISION:  - Fibroadipose tissue, negative for carcinoma  - Lymphoid tissue is not identified   G. LYMPH NODE,HILAR #1, EXCISION:  - Fibroadipose tissue, negative for carcinoma  - Lymphoid tissue is not identified   H. LYMPH NODE, HILAR #2, EXCISION:  - Fibroadipose tissue, negative for carcinoma  - Lymphoid tissue is not identified   I. LYMPH NODE, HILAR #3, EXCISION:  - Lymph node, negative for carcinoma (0/1)   J. LYMPH NODE, HILAR #4, EXCISION:  - Lymph node, negative for carcinoma (0/1)   K. LYMPH NODE, HILAR #5, EXCISION:  - Lymph node, negative for carcinoma (0/1)   L. LYMPH NODE, HILAR #6, EXCISION:  - Lymph node, negative for carcinoma (0/1)   M. LYMPH NODE, HILAR #7, EXCISION:  - Lymph node,  negative for carcinoma (0/1)   N. LYMPH NODE, HILAR #8, EXCISION:  - Lymph node, negative for carcinoma (0/1)   O. LYMPH NODE, HILAR #9, EXCISION:  - Lymph node, negative for carcinoma (0/1)   P. LYMPH NODE, LEVEL 4, EXCISION:  - Lymph node, negative for carcinoma (0/1)   Disposition: Discharge disposition: 01-Home or Self Care        Allergies as of 05/03/2020      Reactions   Statins Other (See Comments)   MYALGIAS      Medication List    STOP taking these medications   gentamicin cream 0.1 % Commonly known as: GARAMYCIN     TAKE these medications   clindamycin 150 MG capsule Commonly known as: CLEOCIN Take 150-300 mg by mouth See admin instructions. 300 mg 1 hour before Dental appt, 150 mg 6 hours after appt   cyclobenzaprine 10 MG tablet Commonly known as: FLEXERIL Take 10 mg by mouth at bedtime.   ibuprofen 200 MG tablet Commonly known as: ADVIL Take 3 tablets (600 mg total) by mouth every 6 (six) hours as needed for headache or mild pain (for pain.). What changed: when to take this   loratadine 10 MG tablet Commonly known as: CLARITIN Take 10 mg by  mouth daily. AllerClear   Magnesium 500 MG Tabs Take 500 mg by mouth in the morning, at noon, and at bedtime.   oxyCODONE-acetaminophen 5-325 MG tablet Commonly known as: PERCOCET/ROXICET Take 1 tablet by mouth every 4 (four) hours as needed for severe pain.   Praluent 150 MG/ML Soaj Generic drug: Alirocumab Inject 1 Dose into the skin every 14 (fourteen) days.       Follow-up Information    Lightfoot, Lucile Crater, MD Follow up.   Specialty: Cardiothoracic Surgery Why: Please see discharge paperwork for follow-up appointment to see surgeon.  Obtain a chest x-ray at [facility] 1/2-hour prior to appointment.  It is located in the same office complex. Contact information: [address]  [place] [address] [phone]        [facility] [place] Follow up.   Why: HHRN  for chest tube dressing check please call  [phone] for [facility] Contact information: [address] [place] [address] [phone]              Discharge Summary documented by Ellwood Handler, PA-C  Signed:  Elgie Collard 05/03/2020, 1:00 PM

## 2020-04-29 NOTE — Plan of Care (Signed)
  Problem: Education: Goal: Knowledge of disease or condition will improve Outcome: Progressing   Problem: Cardiac: Goal: Will achieve and/or maintain hemodynamic stability Outcome: Progressing   Problem: Clinical Measurements: Goal: Postoperative complications will be avoided or minimized Outcome: Progressing   Problem: Respiratory: Goal: Respiratory status will improve Outcome: Progressing   Problem: Pain Management: Goal: Pain level will decrease Outcome: Progressing   Problem: Skin Integrity: Goal: Wound healing without signs and symptoms infection will improve Outcome: Progressing

## 2020-04-30 ENCOUNTER — Inpatient Hospital Stay (HOSPITAL_COMMUNITY): Payer: Medicare Other

## 2020-04-30 LAB — CBC
HCT: 44.2 % (ref 39.0–52.0)
Hemoglobin: 14.4 g/dL (ref 13.0–17.0)
MCH: 30.1 pg (ref 26.0–34.0)
MCHC: 32.6 g/dL (ref 30.0–36.0)
MCV: 92.3 fL (ref 80.0–100.0)
Platelets: 215 10*3/uL (ref 150–400)
RBC: 4.79 MIL/uL (ref 4.22–5.81)
RDW: 12 % (ref 11.5–15.5)
WBC: 13.2 10*3/uL — ABNORMAL HIGH (ref 4.0–10.5)
nRBC: 0 % (ref 0.0–0.2)

## 2020-04-30 LAB — GLUCOSE, CAPILLARY
Glucose-Capillary: 117 mg/dL — ABNORMAL HIGH (ref 70–99)
Glucose-Capillary: 140 mg/dL — ABNORMAL HIGH (ref 70–99)
Glucose-Capillary: 106 mg/dL — ABNORMAL HIGH (ref 70–99)
Glucose-Capillary: 110 mg/dL — ABNORMAL HIGH (ref 70–99)

## 2020-04-30 LAB — COMPREHENSIVE METABOLIC PANEL
ALT: 30 U/L (ref 0–44)
AST: 30 U/L (ref 15–41)
Albumin: 3.5 g/dL (ref 3.5–5.0)
Alkaline Phosphatase: 69 U/L (ref 38–126)
Anion gap: 9 (ref 5–15)
BUN: 20 mg/dL (ref 8–23)
CO2: 24 mmol/L (ref 22–32)
Calcium: 9.1 mg/dL (ref 8.9–10.3)
Chloride: 104 mmol/L (ref 98–111)
Creatinine, Ser: 0.95 mg/dL (ref 0.61–1.24)
GFR calc Af Amer: >60 mL/min (ref >60)
GFR calc non Af Amer: >60 mL/min (ref >60)
Glucose, Bld: 125 mg/dL — ABNORMAL HIGH (ref 70–99)
Potassium: 3.9 mmol/L (ref 3.5–5.1)
Sodium: 137 mmol/L (ref 135–145)
Total Bilirubin: 1.1 mg/dL (ref 0.3–1.2)
Total Protein: 6.6 g/dL (ref 6.5–8.1)

## 2020-04-30 LAB — SURGICAL PATHOLOGY

## 2020-04-30 MED ORDER — GERHARDT'S BUTT CREAM: TOPICAL_CREAM | CUTANEOUS | Status: DC | PRN

## 2020-04-30 NOTE — Plan of Care (Signed)
°  Problem: Activity: Goal: Risk for activity intolerance will decrease Outcome: Progressing   Problem: Clinical Measurements: Goal: Postoperative complications will be avoided or minimized Outcome: Progressing   Problem: Respiratory: Goal: Respiratory status will improve Outcome: Progressing   Problem: Skin Integrity: Goal: Wound healing without signs and symptoms infection will improve Outcome: Progressing   Problem: Education: Goal: Knowledge of General Education information will improve Description: Including pain rating scale, medication(s)/side effects and non-pharmacologic comfort measures Outcome: Progressing   Problem: Clinical Measurements: Goal: Ability to maintain clinical measurements within normal limits will improve Outcome: Progressing Goal: Will remain free from infection Outcome: Progressing   Problem: Clinical Measurements: Goal: Will remain free from infection Outcome: Progressing

## 2020-04-30 NOTE — Progress Notes (Addendum)
[address]       [place] [address]             [phone]      2 Days Post-Op Procedure(s) (LRB): XI ROBOTIC ASSISTED THORASCOPY-RIGHT LOWER LOBE WEDGE RESECTION (Right) XI ROBOTIC ASSISTED THORASCOPY-LOBECTOMY (Right) INTERCOSTAL NERVE BLOCK (Right) NODE DISSECTION (Right) Subjective: Feels pretty well, denies SOB  Objective: Vital signs in last 24 hours: Temp:  [97.7 F (36.5 C)-98.8 F (37.1 C)] 98.6 F (37 C) (08/20 0721) Pulse Rate:  [82-106] 106 (08/19 2253) Cardiac Rhythm: Normal sinus rhythm (08/19 2000) Resp:  [11-24] 20 (08/20 0721) BP: (115-137)/(74-90) 115/74 (08/20 0721) SpO2:  [92 %-98 %] 98 % (08/20 0400) Weight:  [96.2 kg] 96.2 kg (08/20 0400)  Hemodynamic parameters for last 24 hours:    Intake/Output from previous day: 08/19 0701 - 08/20 0700 In: 150 [P.O.:150] Out: 1150 [Urine:950; Chest Tube:200] Intake/Output this shift: No intake/output data recorded.  General appearance: alert, cooperative and no distress Heart: regular rate and rhythm Lungs: somewhat coarse throughput Abdomen: mod distension, + BS, minor tenderness Extremities: no edema or calf pain Wound: incis healing well  Lab Results: Recent Labs    04/29/20 0354 04/30/20 0021  WBC 15.0* 13.2*  HGB 14.0 14.4  HCT 42.0 44.2  PLT 200 215   BMET:  Recent Labs    04/29/20 0354 04/30/20 0021  NA 136 137  K 3.9 3.9  CL 104 104  CO2 24 24  GLUCOSE 131* 125*  BUN 17 20  CREATININE 0.94 0.95  CALCIUM 8.7* 9.1    PT/INR: No results for input(s): LABPROT, INR in the last 72 hours. ABG    Component Value Date/Time   PHART 7.384 04/29/2020 0349   HCO3 25.2 04/29/2020 0349   ACIDBASEDEF 1.6 04/26/2020 1000   O2SAT 98.2 04/29/2020 0349   CBG (last 3)  Recent Labs    04/29/20 1611 04/29/20 2005 04/30/20 0525  GLUCAP 81 131* 117*    Meds Scheduled Meds: . acetaminophen  1,000 mg Oral Q6H   Or  . acetaminophen (TYLENOL) oral liquid 160  mg/5 mL  1,000 mg Oral Q6H  . bisacodyl  10 mg Oral Daily  . bupivacaine liposome  20 mL Infiltration Once  . enoxaparin (LOVENOX) injection  40 mg Subcutaneous Daily  . insulin aspart  0-24 Units Subcutaneous TID AC & HS  . ketorolac  15 mg Intravenous Q6H  . senna-docusate  1 tablet Oral QHS   Continuous Infusions: PRN Meds:.morphine injection, ondansetron (ZOFRAN) IV, oxyCODONE-acetaminophen, polyvinyl alcohol  Xrays DG Chest 1 View  Result Date: 04/29/2020 CLINICAL DATA:  Pneumothorax surveillance EXAM: CHEST  1 VIEW COMPARISON:  04/28/2020 FINDINGS: Persistent large right-sided pneumothorax, similar in size compared to the previous study. Large-bore right-sided chest tube remains in place with distal tip oriented towards the right lung apex. Heart and mediastinal structures remain normally position without evidence of a tension component. Left basilar atelectasis with probable small left pleural effusion. IMPRESSION: 1. Persistent large right-sided pneumothorax. No evidence of tension component. 2. Left basilar atelectasis with probable small left pleural effusion. Electronically Signed   By: Davina Poke D.O.   On: 04/29/2020 08:03   DG Chest Port 1 View  Result Date: 04/28/2020 CLINICAL DATA:  Status post robot assisted thoracoscopy, right-sided chest tube insertion EXAM: PORTABLE CHEST 1 VIEW COMPARISON:  04/26/2020 FINDINGS: Single frontal view of the chest demonstrates postsurgical changes from right thoracoscopy with subcutaneous gas in the right lateral  chest wall. There is a large right pneumothorax volume estimated in excess of 50%, with no mediastinal shift or tension effect. Right-sided chest tube overlies the right hemithorax, tip at the right apex. There is mild vascular redistribution within the left lung. Patchy consolidation at the left lung base likely reflects atelectasis. No acute bony abnormalities. IMPRESSION: 1. Large right-sided pneumothorax consistent with recent  thoracoscopy. Indwelling chest tube as above. No tension effect or midline shift. 2. Left basilar consolidation likely atelectasis. Increased pulmonary vasculature within the left lung consistent with vascular redistribution. These results were called by telephone at the time of interpretation on 04/28/2020 at 3:16pm to patient's nurse, Chat, who verbally acknowledged these results. Electronically Signed   By: Randa Ngo M.D.   On: 04/28/2020 15:21    Assessment/Plan:   1 doing well overall POD #2 2 afeb, VSS 3 sats good on RA 4 large air leak, cont CT to suction, no pntx on CXR- push routine pulm toilet, 200 cc drainage yesterday 5 leukocytosis trend improving 6 no anemic 7 adeq UOP, renal fxn is normal 8 routine advancement in activities as tol 9 may have early ileus, limit pain meds as able  LOS: 2 days    John Giovanni PA-C  pager [phone] 04/30/2020   Agree with above. We will keep chest tube to suction for now. Will transition to [place] pulmonary toilet Continue ambulation  Dasja Brase O Sudeep Scheibel

## 2020-05-01 ENCOUNTER — Inpatient Hospital Stay (HOSPITAL_COMMUNITY): Payer: Medicare Other

## 2020-05-01 LAB — GLUCOSE, CAPILLARY: Glucose-Capillary: 97 mg/dL (ref 70–99)

## 2020-05-01 NOTE — Progress Notes (Addendum)
      [address]       [place] [address]             [phone]      3 Days Post-Op Procedure(s) (LRB): XI ROBOTIC ASSISTED THORASCOPY-RIGHT LOWER LOBE WEDGE RESECTION (Right) XI ROBOTIC ASSISTED THORASCOPY-LOBECTOMY (Right) INTERCOSTAL NERVE BLOCK (Right) NODE DISSECTION (Right)   Subjective:  No specific complaints.  He is frustrated about persistent air leak.    Objective: Vital signs in last 24 hours: Temp:  [97.5 F (36.4 C)-98.6 F (37 C)] 97.7 F (36.5 C) (08/21 0738) Pulse Rate:  [96-100] 100 (08/21 0738) Cardiac Rhythm: Sinus tachycardia (08/21 0722) Resp:  [17-25] 25 (08/21 0738) BP: (125-140)/(80-89) 140/88 (08/21 0738) SpO2:  [93 %-99 %] 96 % (08/21 0738)  Intake/Output from previous day: 08/20 0701 - 08/21 0700 In: 240 [P.O.:240] Out: 910 [Urine:600; Chest Tube:310]  General appearance: alert, cooperative and no distress Heart: regular rate and rhythm Lungs: clear to auscultation bilaterally  Abdomen: soft, non-tender; bowel sounds normal; no masses,  no organomegaly Wound: clean and dry  Lab Results: Recent Labs    04/29/20 0354 04/30/20 0021  WBC 15.0* 13.2*  HGB 14.0 14.4  HCT 42.0 44.2  PLT 200 215   BMET:  Recent Labs    04/29/20 0354 04/30/20 0021  NA 136 137  K 3.9 3.9  CL 104 104  CO2 24 24  GLUCOSE 131* 125*  BUN 17 20  CREATININE 0.94 0.95  CALCIUM 8.7* 9.1    PT/INR: No results for input(s): LABPROT, INR in the last 72 hours. ABG    Component Value Date/Time   PHART 7.384 04/29/2020 0349   HCO3 25.2 04/29/2020 0349   ACIDBASEDEF 1.6 04/26/2020 1000   O2SAT 98.2 04/29/2020 0349   CBG (last 3)  Recent Labs    04/30/20 1615 04/30/20 2116 05/01/20 0628  GLUCAP 106* 110* 97    Assessment/Plan: S/P Procedure(s) (LRB): XI ROBOTIC ASSISTED THORASCOPY-RIGHT LOWER LOBE WEDGE RESECTION (Right) XI ROBOTIC ASSISTED THORASCOPY-LOBECTOMY (Right) INTERCOSTAL NERVE BLOCK (Right) NODE DISSECTION  (Right)  1. CV- hemodynamically stable 2. Pulm- CT with large air leak, will place chest tube to waterseal per Dr. Abran Duke note 3. GI- passing lots of gas, denies N/V 4. Dispo- patient, persistent air leak, place chest tube water seal per Dr. Kipp Brood, repeat CXR in AM   LOS: 3 days    Ellwood Handler, PA-C'  05/01/2020   Chart reviewed, patient examined, agree with above. CXR looks ok with possibly tiny right apical ptx.  He was put on water seal this am. He still has a large 5-6 chamber air leak with deep breathing and coughing. Will repeat CXR in am.  He feels well otherwise.

## 2020-05-02 ENCOUNTER — Inpatient Hospital Stay (HOSPITAL_COMMUNITY): Payer: Medicare Other

## 2020-05-02 MED ORDER — OXYCODONE-ACETAMINOPHEN 5-325 MG PO TABS
1.0000 | ORAL_TABLET | ORAL | Status: DC | PRN
  Administered 2020-05-02 – 2020-05-03 (×5): 1 via ORAL
  Filled 2020-05-02 (×4): qty 1

## 2020-05-02 MED ORDER — OXYCODONE HCL 5 MG PO TABS
2.5000 mg | ORAL_TABLET | ORAL | Status: DC | PRN
  Administered 2020-05-02 – 2020-05-03 (×3): 2.5 mg via ORAL
  Filled 2020-05-02 (×3): qty 1

## 2020-05-02 MED ORDER — OXYCODONE-ACETAMINOPHEN 7.5-325 MG PO TABS: 1.0000 | ORAL_TABLET | ORAL | Status: DC | PRN

## 2020-05-02 MED ORDER — ZOLPIDEM TARTRATE 5 MG PO TABS
5.0000 mg | ORAL_TABLET | Freq: Every evening | ORAL | Status: DC | PRN
  Administered 2020-05-02: 5 mg via ORAL
  Filled 2020-05-02: qty 1

## 2020-05-02 NOTE — Progress Notes (Addendum)
      [address]       [place] [address]             [phone]      4 Days Post-Op Procedure(s) (LRB): XI ROBOTIC ASSISTED THORASCOPY-RIGHT LOWER LOBE WEDGE RESECTION (Right) XI ROBOTIC ASSISTED THORASCOPY-LOBECTOMY (Right) INTERCOSTAL NERVE BLOCK (Right) NODE DISSECTION (Right)   Subjective:  No new complaints.  Remains frustrated about air leak.  He also states he didn't sleep very well last night since his Morphine was discontinued.  He really wants to go home.    Objective: Vital signs in last 24 hours: Temp:  [97.4 F (36.3 C)-98.4 F (36.9 C)] 97.4 F (36.3 C) (08/22 0711) Pulse Rate:  [88-100] 88 (08/22 0711) Cardiac Rhythm: Normal sinus rhythm (08/22 0716) Resp:  [12-20] 19 (08/22 0711) BP: (122-141)/(76-94) 141/94 (08/22 0711) SpO2:  [96 %-98 %] 98 % (08/22 0711)   Intake/Output from previous day: 08/21 0701 - 08/22 0700 In: 64 [P.O.:690] Out: 978 [Urine:900; Chest Tube:78] Intake/Output this shift: Total I/O In: 200 [P.O.:200] Out: 300 [Urine:300]  General appearance: alert, cooperative and no distress Heart: regular rate and rhythm Lungs: clear to auscultation bilaterally Abdomen: soft, non-tender; bowel sounds normal; no masses,  no organomegaly Extremities: extremities normal, atraumatic, no cyanosis or edema Wound: clean and dry  Lab Results: Recent Labs    04/30/20 0021  WBC 13.2*  HGB 14.4  HCT 44.2  PLT 215   BMET:  Recent Labs    04/30/20 0021  NA 137  K 3.9  CL 104  CO2 24  GLUCOSE 125*  BUN 20  CREATININE 0.95  CALCIUM 9.1    PT/INR: No results for input(s): LABPROT, INR in the last 72 hours. ABG    Component Value Date/Time   PHART 7.384 04/29/2020 0349   HCO3 25.2 04/29/2020 0349   ACIDBASEDEF 1.6 04/26/2020 1000   O2SAT 98.2 04/29/2020 0349   CBG (last 3)  Recent Labs    04/30/20 1615 04/30/20 2116 05/01/20 0628  GLUCAP 106* 110* 97    Assessment/Plan: S/P Procedure(s) (LRB): XI  ROBOTIC ASSISTED THORASCOPY-RIGHT LOWER LOBE WEDGE RESECTION (Right) XI ROBOTIC ASSISTED THORASCOPY-LOBECTOMY (Right) INTERCOSTAL NERVE BLOCK (Right) NODE DISSECTION (Right)  1. CV- NSR, +HTN- monitor if remains elevated may need to start antihypertensive vs. Outpatient follow up 2. Pulm- large air leak persists on water seal, CXR with small apical pneumothorax.. will leave chest to water seal if okay with Dr. Cyndia Bent 3. GI- bowels have moved 4. Dispo- patient stable, air leak persists, small apical pneumothorax, leave chest tube water seal, repeat CXR in AM   LOS: 4 days    Ellwood Handler, PA-C 05/02/2020    Chart reviewed, patient examined, agree with above. CXR shows small right apical ptx on water seal. He has large persistent air leak with deep breathing, cough, or tensing abdominal muscles.  Will keep to water seal since lung staying fairly well expanded. Repeat CXR in am.

## 2020-05-03 ENCOUNTER — Inpatient Hospital Stay (HOSPITAL_COMMUNITY): Payer: Medicare Other

## 2020-05-03 ENCOUNTER — Telehealth: Payer: Self-pay | Admitting: Emergency Medicine

## 2020-05-03 MED ORDER — OXYCODONE-ACETAMINOPHEN 5-325 MG PO TABS
1.0000 | ORAL_TABLET | ORAL | 0 refills | Status: DC | PRN
Start: 2020-05-03 — End: 2020-05-07

## 2020-05-03 MED ORDER — IBUPROFEN 200 MG PO TABS
600.0000 mg | ORAL_TABLET | Freq: Four times a day (QID) | ORAL | 0 refills | Status: DC | PRN
Start: 2020-05-03 — End: 2020-06-17

## 2020-05-03 NOTE — Discharge Instructions (Signed)
1. Please have patient watch video on Youtube: Atrium mini express. [facility] patient education  2. General Patient Instructions:  If the tube becomes disconnected, reconnect it immediately and tape it securely. To reconnect the tube: Pinch the chest tube with your fingers to close the chest tube until you can re attach it. If you are able, clean the ends of the chest tube and drain with an alcohol swab before reattaching.   If the tube becomes disconnected AND you cannot put it back together, go to closest Emergency Department  Please do NOT allow the mini express chamber to become full. To empty the fluid from the chamber: First, wash your hands with soap and water. Use a Luer lock syringe (provided by hospital or home health, if arranged) to attach to the front port (twist Luer lock syringe clock wise to to tighten) at the bottom of the mini express. Pull the syringe plunger back, unscrew the syringe and put fluid into the toilet. You may repeat as necessary. If it becomes difficult to empty the fluid in the mini express, squirt water through the port (where syringe attaches to) to flush out the blockage. If this does not work, call the office .  Try to keep mini express upright and below the level of your heart. Also, check periodically that there are no kinks in the tubing.   Changing the chest tube dressing: Please change the dressing at least every other day or if it becomes wet. You will be taught how to change the dressing before you are discharged from the hospital or home health will be arranged to do it for you.   If the chest falls out or gets pulled out: Place a piece of gauze over the site and cover the gauze with tape. Contact our office ASAP [phone]) If you have chest pain or sudden onset of shortness of breath, go to the nearest emergency room   [facility] office phone number [phone]   Robot-Assisted Thoracic Surgery, Care After This sheet gives you information  about how to care for yourself after your procedure. Your health care provider may also give you more specific instructions. If you have problems or questions, contact your health care provider. What can I expect after the procedure? After the procedure, it is common to have:  Some pain and aches in the area of your surgical cuts (incisions).  Pain when breathing in (inhaling) and coughing.  Tiredness (fatigue).  Trouble sleeping.  Constipation. Follow these instructions at home: Medicines  Take over-the-counter and prescription medicines only as told by your health care provider.  If you were prescribed an antibiotic medicine, take it as told by your health care provider. Do not stop taking the antibiotic even if you start to feel better.  Talk with your health care provider about safe and effective ways to manage pain after your procedure. Pain management should fit your specific health needs.  Take prescription pain medicine before pain becomes severe. Relieving and controlling your pain will make breathing easier for you. Activity  Return to your normal activities as told by your health care provider. Ask your health care provider what activities are safe for you.  Do not lift anything that is heavier than 10 lb (4.5 kg), or the limit that you are told, until your health care provider says that it is safe.  Avoid sitting for a long time without moving. Get up and move around one or more times every few hours. Bathing  Do not take baths, swim, or use a hot tub until your health care provider approves. You may take showers. Incision care  Follow instructions from your health care provider about how to take care of your incision(s). Make sure you: ? Wash your hands with soap and water before you change your bandage (dressing). If soap and water are not available, use hand sanitizer. ? Change your dressing as told by your health care provider. ? Leave stitches (sutures), skin  glue, or adhesive strips in place. These skin closures may need to stay in place for 2 weeks or longer. If adhesive strip edges start to loosen and curl up, you may trim the loose edges. Do not remove adhesive strips completely unless your health care provider tells you to do that.  Check your incision area every day for signs of infection. Check for: ? Redness, swelling, or pain. ? Fluid or blood. ? Warmth. ? Pus or a bad smell. Driving  Ask your health care provider when it is safe for you to drive.  Do not drive or use heavy machinery while taking prescription pain medicine. Eating and drinking  Follow instructions from your health care provider about eating or drinking restrictions. These will vary depending on what procedure you had. Your health care provider may recommend: ? A liquid diet or soft diet for the first few days. ? Meals that are smaller and more frequent. ? A diet of fruits, vegetables, whole grains, and low-fat proteins. ? Limiting foods that are high in processed sugar and fat, including fried and sweet foods. Pneumonia prevention   Do not use any products that contain nicotine or tobacco, such as cigarettes and e-cigarettes. If you need help quitting, ask your health care provider.  Avoid secondhand smoke.  Do deep breathing exercises and cough regularly as directed. This helps to clear mucus and prevent pneumonia. If it hurts to cough, try one of these methods to ease your pain when you cough: ? Hold a pillow against your chest. ? Place the palms of both hands over your incisions (use splinting).  Use an incentive spirometer as directed. This device measures how much air your lungs are getting with each breath. Using this will improve your breathing.  Do pulmonary rehabilitation as directed. This is a program that includes exercise, education, and support. General instructions  Wear compression stockings as told by your health care provider. These stockings  help to prevent blood clots and reduce swelling in your legs.  If you have a drainage tube: ? Follow instructions from your health care provider about how to take care of it. ? Do not travel by airplane after your tube is removed until your health care provider tells you it is safe.  To prevent or treat constipation while you are taking prescription pain medicine, your health care provider may recommend that you: ? Drink enough fluid to keep your urine pale yellow. ? Take over-the-counter or prescription medicines. ? Eat foods that are high in fiber, such as fresh fruits and vegetables, whole grains, and beans. ? Limit foods that are high in fat and processed sugars, such as fried and sweet foods.  Keep all follow-up visits as told by your health care provider. This is important. Contact a health care provider if:  You have redness, swelling, or pain around an incision.  You have fluid or blood coming from an incision.  An incision feels warm to the touch.  You have pus or a bad smell coming from  an incision.  You have a fever.  You cannot eat or drink without vomiting.  Your prescription pain medicine is not controlling your pain. Get help right away if:  You have chest pain.  Your heart is beating quickly.  You have trouble breathing.  You have trouble speaking.  You are confused.  You feel weak or dizzy, or you faint. These symptoms may represent a serious problem that is an emergency. Do not wait to see if the symptoms will go away. Get medical help right away. Call your local emergency services (911 in the [place] Do not drive yourself to the hospital. Summary  Talk with your health care provider about safe and effective ways to manage pain after your procedure. Pain management should fit your specific health needs.  Return to your normal activities as told by your health care provider. Ask your health care provider what activities are safe for you.  Do deep  breathing exercises and cough regularly as directed. This helps to clear mucus and prevent pneumonia. If it hurts to cough, ease pain by holding a pillow against your chest or by placing the palms of both hands over your incisions (splinting). This information is not intended to replace advice given to you by your health care provider. Make sure you discuss any questions you have with your health care provider. Document Revised: 06/27/2019 Document Reviewed: 01/01/2017 Elsevier Patient Education  [employer].

## 2020-05-03 NOTE — Progress Notes (Signed)
      [address]       [place] [address]             [phone]     Chest xray reviewed and below:    CLINICAL DATA:  Right chest tube  EXAM: PORTABLE CHEST 1 VIEW  COMPARISON:  Earlier same day  FINDINGS: Persistent but decreased right pneumothorax. Apically directed chest tube remains present. Mild atelectasis at the left lung base. The left costophrenic angle is excluded. Normal heart size. Residual air within the right chest wall.  IMPRESSION: Persistent but decreased right pneumothorax.   Electronically Signed   By: Macy Mis M.D.   On: 05/03/2020 12:13   Plan: Patient will be discharged with a mini express today. Appointment with Dr. Kipp Brood scheduled for 8/27.    Nicholes Rough, PA-C

## 2020-05-03 NOTE — Telephone Encounter (Signed)
ATC, left VM for patient to return call to schedule a post hospital f/u visit with Dr. Lamonte Sakai.

## 2020-05-03 NOTE — TOC Transition Note (Signed)
Transition of Care [facility]) - CM/SW Discharge Note   Patient Details  Name: [name] [name] MRN: [id number] Date of Birth: 25-Feb-1951  Transition of Care [facility]) CM/SW Contact:  Zenon Mayo, RN Phone Number: 05/03/2020, 11:08 AM   Clinical Narrative:    NCM spoke with patient and wife at bedside offered choice, they have no prerference.  NCM made referral to [facility] with [facility] for [facility], she is able to take referral.  Soc will begin tomorrow.  Patient requested that [facility] be vaccinated and NCM informed Malachy Mood with [facility] of this information.    Final next level of care: [place] Barriers to Discharge: No Barriers Identified   Patient Goals and CMS Choice Patient states their goals for this hospitalization and ongoing recovery are:: get better CMS Medicare.gov Compare Post Acute Care list provided to:: Patient Represenative (must comment) Choice offered to / list presented to : Adult Children, Patient  Discharge Placement                       Discharge Plan and Services                  DME Agency: NA       HH Arranged: RN [place] Agency: [place] Date HH Agency Contacted: 05/03/20 Time [place]: 1108 Representative spoke with at [place]: [place]) Interventions     Readmission Risk Interventions No flowsheet data found.

## 2020-05-03 NOTE — Plan of Care (Signed)
  Problem: Education: Goal: Knowledge of disease or condition will improve Outcome: Progressing Goal: Knowledge of the prescribed therapeutic regimen will improve Outcome: Progressing   Problem: Activity: Goal: Risk for activity intolerance will decrease Outcome: Progressing   Problem: Cardiac: Goal: Will achieve and/or maintain hemodynamic stability Outcome: Progressing   Problem: Clinical Measurements: Goal: Postoperative complications will be avoided or minimized Outcome: Progressing   Problem: Respiratory: Goal: Respiratory status will improve Outcome: Progressing   Problem: Pain Management: Goal: Pain level will decrease Outcome: Progressing   Problem: Skin Integrity: Goal: Wound healing without signs and symptoms infection will improve Outcome: Progressing   Problem: Education: Goal: Knowledge of General Education information will improve Description: Including pain rating scale, medication(s)/side effects and non-pharmacologic comfort measures Outcome: Progressing   Problem: Health Behavior/Discharge Planning: Goal: Ability to manage health-related needs will improve Outcome: Progressing   Problem: Clinical Measurements: Goal: Ability to maintain clinical measurements within normal limits will improve Outcome: Progressing Goal: Will remain free from infection Outcome: Progressing Goal: Diagnostic test results will improve Outcome: Progressing Goal: Respiratory complications will improve Outcome: Progressing Goal: Cardiovascular complication will be avoided Outcome: Progressing   Problem: Activity: Goal: Risk for activity intolerance will decrease Outcome: Progressing   Problem: Nutrition: Goal: Adequate nutrition will be maintained Outcome: Progressing   Problem: Coping: Goal: Level of anxiety will decrease Outcome: Progressing   Problem: Pain Managment: Goal: General experience of comfort will improve Outcome: Progressing   Problem:  Elimination: Goal: Will not experience complications related to bowel motility Outcome: Progressing Goal: Will not experience complications related to urinary retention Outcome: Progressing   Problem: Safety: Goal: Ability to remain free from injury will improve Outcome: Progressing   Problem: Skin Integrity: Goal: Risk for impaired skin integrity will decrease Outcome: Progressing

## 2020-05-03 NOTE — Progress Notes (Signed)
R lateral chest tube dressing changed, gauze saturated with serosanguinous fliud. Exchanged sahara to MiniExpress, NO complications noted. Continues to have tidaling and leak noted.  Pt and spouse educated on how to empty mini express with lurelock 29ml syringe, demonstrated dressing change to spouse. Educated to watch for s/s infection, continue bed baths. Planned CXR at noon.

## 2020-05-03 NOTE — Progress Notes (Signed)
Pt discharged home with wife, taken to front of hospital via w/c

## 2020-05-03 NOTE — Progress Notes (Signed)
      [address]       [place] [address]             [phone]      5 Days Post-Op Procedure(s) (LRB): XI ROBOTIC ASSISTED THORASCOPY-RIGHT LOWER LOBE WEDGE RESECTION (Right) XI ROBOTIC ASSISTED THORASCOPY-LOBECTOMY (Right) INTERCOSTAL NERVE BLOCK (Right) NODE DISSECTION (Right) Subjective: Feels okay this morning. Constipation improved  Objective: Vital signs in last 24 hours: Temp:  [97.8 F (36.6 C)-98.7 F (37.1 C)] 98.3 F (36.8 C) (08/23 0415) Pulse Rate:  [80-89] 80 (08/22 1541) Cardiac Rhythm: Normal sinus rhythm (08/22 2018) Resp:  [13-20] 20 (08/23 0415) BP: (124-138)/(73-89) 138/73 (08/23 0415) SpO2:  [95 %-96 %] 96 % (08/23 0415)     Intake/Output from previous day: 08/22 0701 - 08/23 0700 In: 640 [P.O.:640] Out: 980 [Urine:900; Chest Tube:80] Intake/Output this shift: No intake/output data recorded.  General appearance: alert, cooperative and no distress Heart: regular rate and rhythm, S1, S2 normal, no murmur, click, rub or gallop Lungs: clear to auscultation bilaterally Abdomen: soft, non-tender; bowel sounds normal; no masses,  no organomegaly Extremities: extremities normal, atraumatic, no cyanosis or edema Wound: clean and dry  Lab Results: No results for input(s): WBC, HGB, HCT, PLT in the last 72 hours. BMET: No results for input(s): NA, K, CL, CO2, GLUCOSE, BUN, CREATININE, CALCIUM in the last 72 hours.  PT/INR: No results for input(s): LABPROT, INR in the last 72 hours. ABG    Component Value Date/Time   PHART 7.384 04/29/2020 0349   HCO3 25.2 04/29/2020 0349   ACIDBASEDEF 1.6 04/26/2020 1000   O2SAT 98.2 04/29/2020 0349   CBG (last 3)  Recent Labs    04/30/20 1615 04/30/20 2116 05/01/20 0628  GLUCAP 106* 110* 97    Assessment/Plan: S/P Procedure(s) (LRB): XI ROBOTIC ASSISTED THORASCOPY-RIGHT LOWER LOBE WEDGE RESECTION (Right) XI ROBOTIC ASSISTED THORASCOPY-LOBECTOMY (Right) INTERCOSTAL NERVE BLOCK  (Right) NODE DISSECTION (Right)   1. Pulm- CXR: right sided pneumo remains stable compared to yesterdays study. Large air leak, leave to WS 2. CV- NSR in the 80s-90s, BP well controlled.  3. GI-constipation resolved 4. Renal- creatinine 0.95, electrolytes okay  Plan: continue medical care. Continue ambulation. Persistent air leak-will discuss next steps of care with Dr. Kipp Brood. He remains on water seal this morning and his pain is well controlled.   LOS: 5 days    Elgie Collard 05/03/2020

## 2020-05-06 ENCOUNTER — Other Ambulatory Visit: Payer: Self-pay | Admitting: *Deleted

## 2020-05-06 ENCOUNTER — Telehealth: Payer: Self-pay

## 2020-05-06 ENCOUNTER — Ambulatory Visit: Payer: Medicare Other | Admitting: Emergency Medicine

## 2020-05-06 ENCOUNTER — Encounter: Payer: Self-pay | Admitting: *Deleted

## 2020-05-06 ENCOUNTER — Other Ambulatory Visit: Payer: Self-pay | Admitting: Thoracic Surgery (Cardiothoracic Vascular Surgery)

## 2020-05-06 DIAGNOSIS — Z9889 Other specified postprocedural states: Secondary | ICD-10-CM

## 2020-05-06 DIAGNOSIS — R918 Other nonspecific abnormal finding of lung field: Secondary | ICD-10-CM

## 2020-05-06 NOTE — Progress Notes (Signed)
I received referral on [name] [name] today.  I called and scheduled him to be seen next week. He verbalized understanding of appt time and place.

## 2020-05-06 NOTE — Telephone Encounter (Signed)
Called and spoke to pt. Pt states he was informed by Dr. Kipp Brood that the pt didn't need to keep his appt with Dr. Lamonte Sakai, appt for 8/26 has already been cancelled. The pt has a follow up appt with Dr. Kipp Brood on 8/27 and pt states he may have the chest tube removed. Pt states he will call back to schedule another visit. Nothing further needed at this time.

## 2020-05-06 NOTE — Telephone Encounter (Signed)
[name] [name] contacted the office concerned about his sharp pain in between scapula and a feeling of unable to catch his breath when taking a deep breath.  He is s/p Right RATS LL wedge recestion with Dr. Kipp Brood 04/28/20.  He stated that he spoke with Dr. Kipp Brood this morning (about another matter) and had not had that much pain before.  He stated that he was taking his percocet and Ibuprofen close together and he was unsure if he was going to get sleep tonight.  Spoke with Dr. Kipp Brood over the phone and advised that the pain is from the chest tube.  Advised patient that he could take medications separately to help with pain management and also advised to take a Benadryl to help with sleep.  He does have an appointment with Dr. Kipp Brood tomorrow, 05/07/20 and suggested that he could come in earlier if his pain did not improve overnight.  Patient acknowledged receipt.

## 2020-05-06 NOTE — Progress Notes (Signed)
The proposed treatment discussed in cancer conference is for discussion purpose only and is not a binding recommendation. The patient was not physically examined nor present for their treatment options. Therefore, final treatment plans cannot be decided.  ?

## 2020-05-07 ENCOUNTER — Other Ambulatory Visit: Payer: Self-pay

## 2020-05-07 ENCOUNTER — Encounter: Payer: Self-pay | Admitting: Thoracic Surgery (Cardiothoracic Vascular Surgery)

## 2020-05-07 ENCOUNTER — Ambulatory Visit (INDEPENDENT_AMBULATORY_CARE_PROVIDER_SITE_OTHER): Payer: Self-pay | Admitting: Thoracic Surgery (Cardiothoracic Vascular Surgery)

## 2020-05-07 ENCOUNTER — Ambulatory Visit
Admission: RE | Admit: 2020-05-07 | Discharge: 2020-05-07 | Disposition: A | Discharge status: Home / Self Care | Payer: Medicare Other | Source: Ambulatory Visit | Attending: Thoracic Surgery (Cardiothoracic Vascular Surgery) | Admitting: Thoracic Surgery (Cardiothoracic Vascular Surgery)

## 2020-05-07 VITALS — BP 117/77 | HR 96 | Temp 97.8°F | Resp 20 | Ht 73.0 in | Wt 207.0 lb

## 2020-05-07 DIAGNOSIS — C801 Malignant (primary) neoplasm, unspecified: Secondary | ICD-10-CM

## 2020-05-07 DIAGNOSIS — Z09 Encounter for follow-up examination after completed treatment for conditions other than malignant neoplasm: Secondary | ICD-10-CM

## 2020-05-07 DIAGNOSIS — R918 Other nonspecific abnormal finding of lung field: Secondary | ICD-10-CM

## 2020-05-07 DIAGNOSIS — J9382 Other air leak: Secondary | ICD-10-CM

## 2020-05-07 MED ORDER — OXYCODONE-ACETAMINOPHEN 5-325 MG PO TABS
1.0000 | ORAL_TABLET | ORAL | 0 refills | Status: DC | PRN
Start: 2020-05-07 — End: 2020-05-21

## 2020-05-07 NOTE — Progress Notes (Signed)
[address]       [place] [address]             [phone]        Walid Polcyn Havelock Medical Record #[id number] Date of Birth: 01-Oct-1950  Referring: Collene Gobble, MD Primary Care: Gaynelle Arabian, MD Primary Cardiologist:No primary care provider on file.  Reason for visit:   follow-up  History of Present Illness:     [name] comes in for his first follow-up appointment. The chest tube remains in place and he occasionally has some mid back chest pain which is likely related to the chest tube. He occasionally has some gurgling but has not had any in the past 2 days.  Physical Exam: BP 117/77   Pulse 96   Temp 97.8 F (36.6 C) (Skin)   Resp 20   Ht 6\' 1"  (1.854 m)   Wt 207 lb (93.9 kg)   SpO2 97% Comment: RA  BMI 27.31 kg/m   Alert NAD Incision clean.  Small leak noted with cough. Abdomen soft, ND No peripheral edema   Diagnostic Studies & Laboratory data: CXR: Clear without pneumothorax Path:  A. LUNG, RIGHT LOWER LOBE, WEDGE RESECTION:  - Mucinous adenocarcinoma of lung, 8 cm  - Carcinoma invades into the visceral pleural surface  - Resection margins negative for carcinoma  - Negative for lymphovascular invasion  - See oncology table   B. LUNG, RIGHT LOWER LOBE, LOBECTOMY:  - Lung lobe and bronchovascular margin, negative for carcinoma  - Two benign lymph nodes (0/2)   C. LYMPH NODE, LEVEL 7 #1, EXCISION:  - Fibroadipose tissue, negative for carcinoma  - Lymphoid tissue is not identified   D. LYMPH NODE, LEVEL 7 #2, EXCISION:  - Lymph node, negative for carcinoma (0/1)   E. LYMPH NODE, LEVEL 7 #3, EXCISION:  - Lymph node, negative for carcinoma (0/1)   F. LYMPH NODE, LEVEL 9, EXCISION:  - Fibroadipose tissue, negative for carcinoma  - Lymphoid tissue is not identified   G. LYMPH NODE,HILAR #1, EXCISION:  - Fibroadipose tissue, negative for carcinoma  - Lymphoid tissue is not identified   H. LYMPH NODE,  HILAR #2, EXCISION:  - Fibroadipose tissue, negative for carcinoma  - Lymphoid tissue is not identified   I. LYMPH NODE, HILAR #3, EXCISION:  - Lymph node, negative for carcinoma (0/1)   J. LYMPH NODE, HILAR #4, EXCISION:  - Lymph node, negative for carcinoma (0/1)   K. LYMPH NODE, HILAR #5, EXCISION:  - Lymph node, negative for carcinoma (0/1)   L. LYMPH NODE, HILAR #6, EXCISION:  - Lymph node, negative for carcinoma (0/1)   M. LYMPH NODE, HILAR #7, EXCISION:  - Lymph node, negative for carcinoma (0/1)   N. LYMPH NODE, HILAR #8, EXCISION:  - Lymph node, negative for carcinoma (0/1)   O. LYMPH NODE, HILAR #9, EXCISION:  - Lymph node, negative for carcinoma (0/1)   P. LYMPH NODE, LEVEL 4, EXCISION:  - Lymph node, negative for carcinoma (0/1)      ONCOLOGY TABLE:   LUNG: Resection   Procedure: Lung lobectomy  Specimen Laterality: Right  Tumor Site: Lower lobe  Tumor Size: 8 cm  Tumor Focality: Unifocal  Histologic Type: Mucinous adenocarcinoma  Visceral Pleura Invasion: Present  Lymphovascular Invasion: Not identified  Direct Invasion of Adjacent Structures: No adjacent structures present  Margins: Uninvolved by tumor  Treatment Effect: No know presurgical therapy  Regional Lymph Nodes:  Number of Lymph Nodes Involved: 0    Number of Lymph Nodes Examined: 12  Pathologic Stage Classification (pTNM, AJCC 8th Edition): pT4, pN0     Assessment / Plan:   69 year old male status post robotic assisted right lower lobectomy for a 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 He has had a prolonged air leak following surgery but this is being managed conservatively with tube thoracostomy. He still had a small air leak today. I will see him back in clinic in 1week with a chest x-ray to evaluate for chest tube removal.  His case was discussed in tumor board and he will meet with Dr. Julien Nordmann to discuss options for adjuvant therapy.   Lajuana Matte 05/07/2020 3:44 PM

## 2020-05-13 ENCOUNTER — Encounter: Payer: Self-pay | Admitting: Internal Medicine

## 2020-05-13 ENCOUNTER — Encounter: Payer: Self-pay | Admitting: Radiology

## 2020-05-13 ENCOUNTER — Inpatient Hospital Stay: Payer: Medicare Other | Attending: Internal Medicine | Admitting: Internal Medicine

## 2020-05-13 ENCOUNTER — Other Ambulatory Visit: Payer: Self-pay | Admitting: *Deleted

## 2020-05-13 ENCOUNTER — Inpatient Hospital Stay: Payer: Medicare Other

## 2020-05-13 ENCOUNTER — Other Ambulatory Visit: Payer: Self-pay | Admitting: Thoracic Surgery (Cardiothoracic Vascular Surgery)

## 2020-05-13 ENCOUNTER — Other Ambulatory Visit: Payer: Self-pay

## 2020-05-13 DIAGNOSIS — C3490 Malignant neoplasm of unspecified part of unspecified bronchus or lung: Secondary | ICD-10-CM

## 2020-05-13 DIAGNOSIS — C3491 Malignant neoplasm of unspecified part of right bronchus or lung: Secondary | ICD-10-CM | POA: Insufficient documentation

## 2020-05-13 DIAGNOSIS — Z87891 Personal history of nicotine dependence: Secondary | ICD-10-CM | POA: Insufficient documentation

## 2020-05-13 DIAGNOSIS — Z9889 Other specified postprocedural states: Secondary | ICD-10-CM

## 2020-05-13 DIAGNOSIS — Z800 Family history of malignant neoplasm of digestive organs: Secondary | ICD-10-CM | POA: Insufficient documentation

## 2020-05-13 DIAGNOSIS — Z7189 Other specified counseling: Secondary | ICD-10-CM

## 2020-05-13 DIAGNOSIS — Z801 Family history of malignant neoplasm of trachea, bronchus and lung: Secondary | ICD-10-CM | POA: Insufficient documentation

## 2020-05-13 DIAGNOSIS — Z902 Acquired absence of lung [part of]: Secondary | ICD-10-CM | POA: Insufficient documentation

## 2020-05-13 DIAGNOSIS — Z96653 Presence of artificial knee joint, bilateral: Secondary | ICD-10-CM

## 2020-05-13 DIAGNOSIS — C3411 Malignant neoplasm of upper lobe, right bronchus or lung: Secondary | ICD-10-CM | POA: Diagnosis not present

## 2020-05-13 DIAGNOSIS — C3431 Malignant neoplasm of lower lobe, right bronchus or lung: Secondary | ICD-10-CM | POA: Insufficient documentation

## 2020-05-13 DIAGNOSIS — Z5111 Encounter for antineoplastic chemotherapy: Secondary | ICD-10-CM | POA: Insufficient documentation

## 2020-05-13 DIAGNOSIS — Z96611 Presence of right artificial shoulder joint: Secondary | ICD-10-CM

## 2020-05-13 DIAGNOSIS — R918 Other nonspecific abnormal finding of lung field: Secondary | ICD-10-CM

## 2020-05-13 LAB — CBC WITH DIFFERENTIAL (CANCER CENTER ONLY)
Abs Immature Granulocytes: 0.03 10*3/uL (ref 0.00–0.07)
Basophils Absolute: 0.1 10*3/uL (ref 0.0–0.1)
Basophils Relative: 1 %
Eosinophils Absolute: 0.9 10*3/uL — ABNORMAL HIGH (ref 0.0–0.5)
Eosinophils Relative: 8 %
HCT: 44.4 % (ref 39.0–52.0)
Hemoglobin: 14.9 g/dL (ref 13.0–17.0)
Immature Granulocytes: 0 %
Lymphocytes Relative: 15 %
Lymphs Abs: 1.8 10*3/uL (ref 0.7–4.0)
MCH: 31 pg (ref 26.0–34.0)
MCHC: 33.6 g/dL (ref 30.0–36.0)
MCV: 92.5 fL (ref 80.0–100.0)
Monocytes Absolute: 0.9 10*3/uL (ref 0.1–1.0)
Monocytes Relative: 7 %
Neutro Abs: 7.8 10*3/uL — ABNORMAL HIGH (ref 1.7–7.7)
Neutrophils Relative %: 69 %
Platelet Count: 286 10*3/uL (ref 150–400)
RBC: 4.8 MIL/uL (ref 4.22–5.81)
RDW: 12 % (ref 11.5–15.5)
WBC Count: 11.4 10*3/uL — ABNORMAL HIGH (ref 4.0–10.5)
nRBC: 0 % (ref 0.0–0.2)

## 2020-05-13 NOTE — Progress Notes (Signed)
The proposed treatment discussed in cancer conference is for discussion purpose only and is not a binding recommendation. The patient was not physically examined nor present for their treatment options. Therefore, final treatment plans cannot be decided.  ?

## 2020-05-13 NOTE — Progress Notes (Signed)
[place] [phone]   [phone]  CONSULT NOTE  REFERRING PHYSICIAN: Dr. Melodie Bouillon  REASON FOR CONSULTATION:  69 years old white male recently diagnosed with lung cancer.  HPI [name] [name] is a 69 y.o. male with past medical history significant for multiple medical problems including GERD, kidney stone, hearing deficit, osteopenia, gallstones, fatty liver, bilateral cataract, carpal tunnel syndrome as well as osteoarthritis and history of smoking but quit in 2013.  The patient had CT cardiac scoring on 02/06/2020 and incidentally it showed a right lower lobe macrolobulated mass with spiculated margins measuring 2.3 x 2.0 x 5.7 cm making contact with the overlying pleura highly concerning for primary bronchogenic carcinoma.  There was also 0.3 cm subpleural nodule in the right middle lobe.  The patient had CT super D of the chest on 02/12/2020 and it showed 4.7 x 3.5 x 2.7 cm spiculated microlobulated somewhat bilobed mass lesion in the posterior right lower lobe with CT imaging features highly suspicious for primary bronchogenic neoplasm.  There was also 0.6 cm subpleural right middle lobe pulmonary nodule likely benign.  The patient was referred to Dr. Lamonte Sakai and on 02/24/2020 he underwent video bronchoscopy with electromagnetic navigation procedure.  The final cytology was not conclusive for malignancy.  He had a PET scan on 03/10/2020 and it showed the spiculated pulmonary mass in the right lower lobe is not significantly changed accounting for respiratory motion on the current study and it measure 3.1 x 2.3 cm with SUV max of 5.0.  No additional suspicious abnormality aside from a 0.4 cm nodule in the anterior right upper lobe with no FDG activity.  There was no enlarged or FDG avid lymph nodes. The patient was referred to Dr. Kipp Brood and on 04/28/2020 he underwent robotic assisted right video thoracoscopy with right lower lobectomy and mediastinal  lymph node dissection.  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  The resection margin was negative for carcinoma and there was no evidence for lymphovascular invasion.  The dissected lymph nodes were negative for malignancy and the final pathologic stage was pT4, pN0. The patient was referred to me today for evaluation and recommendation regarding his adjuvant therapy. When seen today the patient continues to complain of mild pain from the right-sided chest tube and he still have some drainage from the tube.  He denied having any shortness of breath but has occasional cough with no hemoptysis.  He has no nausea, vomiting, diarrhea but has constipation.  The patient lost around 15 pounds in the last few months.  He has no headache or visual changes. Family history significant for father with congestive heart failure.  Mother had pancreatic cancer.  2 paternal uncle had lung cancer and brother with heart disease. The patient is married and has 1 daughter.  He used to work for [employer].  He was accompanied today by his wife [name] [name].  He has a history of smoking 1 pack/day for around 35 years and quit in 2013.  He also used to drink alcohol regularly but not in the last 2 months.  He has no history of drug abuse HPI  Past Medical History:  Diagnosis Date   Arthritis    Cancer ([place])    RIGHT LUNG   Carpal tunnel syndrome    Right   Cataracts, bilateral    very mild   Early cataracts, bilateral  Fatty liver    Gallstones    GERD (gastroesophageal reflux disease)    Headache 2011   HX OF VISUAL MIGRAINES - NO PAIN --MIGRAINES ATTRIBUTED TO PT CUTTING NICOTINE PATCH IN [name] BEFORE APPLYING TO HIS SKIN.     Hepatitis 2001   C -treated and no problems since  2001 'cured'   History of hiatal hernia    History of kidney stones    found on CT Scan    HOH (hard of hearing)    Muscle cramps    hands and legs   Osteopenia    Pain    KNEE OA   Right lower lobe lung mass    Shortness of breath dyspnea    "SOMETIMES FEELS LIKE I CAN'T CATCH A DEEP BREATH "- ON GOING FOR YEARS - STATES MEDICAL DOCTOR SENT HIM FOR CARDIAC WORK UP 2011 - [place] OK   Stroke ([place])    HX POSSIBLE TIA - RT LEG NUMBNESS AND TINGLING FINGERS AND NUMBNESS RT FACE - ALL RESOLVED ; STATES NEUROLOGIST COULD NOT FIND ANY EVIDENCE OF STROKE - THOUGHT PT'S EPISODE MIGHT BE FROM CUTTING NICOTINE PATCH IN [name] AND APPLYING TO SKIN   Trigger finger    LEFT MIDDLE FINGER   Wears glasses     Past Surgical History:  Procedure Laterality Date   BRONCHIAL BIOPSY  02/24/2020   Procedure: BRONCHIAL BIOPSIES;  Surgeon: Collene Gobble, MD;  Location: [facility];  Service: Pulmonary;;   BRONCHIAL BRUSHINGS  02/24/2020   Procedure: BRONCHIAL BRUSHINGS;  Surgeon: Collene Gobble, MD;  Location: [facility] END[facility]COPY;  Service: Pulmonary;;   BRONCHIAL NEEDLE ASPIRATION BIOPSY  02/24/2020   Procedure: BRONCHIAL NEEDLE ASPIRATION BIOPSIES;  Surgeon: Collene Gobble, MD;  Location: [facility]OSCOPY;  Service: Pulmonary;;   BRONCHIAL WASHINGS  02/24/2020   Procedure: BRONCHIAL WASHINGS;  Surgeon: Collene Gobble, MD;  Location: [facility] ENDOSCOPY;  Service: Pulmonary;;   COLONOSCOPY     INTERCOSTAL NERVE BLOCK Right 04/28/2020   Procedure: INTERCOSTAL NERVE BLOCK;  Surgeon: Lajuana Matte, MD;  Location: [facility];  Service: Thoracic;  Laterality: Right;   left ankle surgery     fractured   left knee surgery     MENISCAL REMOVED   left shoulder     torn ligaments left shoulder   LUMBAR FUSION     L3 L4 L5   NODE DISSECTION Right 04/28/2020   Procedure: NODE DISSECTION;  Surgeon: Lajuana Matte, MD;  Location: [facility];  Service: Thoracic;  Laterality: Right;   RIGHT CARPAL TUNNEL RELEASE  2011   RIGHT KNEE ARTHROSCOPY   AUG OR SEPT 2015   AT [place] Right 04/28/2020   XI ROBOTIC ASSISTED THORASCOPY-LOBECTOMY (Right)   TONSILLECTOMY     as child   TOTAL KNEE ARTHROPLASTY Left 07/14/2013   Procedure: LEFT TOTAL KNEE ARTHROPLASTY;  Surgeon: Mauri Pole, MD;  Location: [facility];  Service: Orthopedics;  Laterality: Left;   TOTAL KNEE ARTHROPLASTY Right 07/21/2014   Procedure: RIGHT TOTAL KNEE ARTHROPLASTY;  Surgeon: Mauri Pole, MD;  Location: [facility];  Service: Orthopedics;  Laterality: Right;   TOTAL SHOULDER ARTHROPLASTY Right 10/17/2019   Procedure: RIGHT REVERSE TOTAL SHOULDER REPLACEMENT;  Surgeon: Netta Cedars, MD;  Location: [facility];  Service: Orthopedics;  Laterality: Right;  interscalene block   TRIGGER FINGER RELEASE Left    VIDEO BRONCHOSCOPY WITH ENDOBRONCHIAL NAVIGATION Right 02/24/2020   Procedure:  VIDEO BRONCHOSCOPY WITH ENDOBRONCHIAL NAVIGATION;  Surgeon: Collene Gobble, MD;  Location[facility];  Service: Pulmonary;  Laterality: Right;   WEDGE RESECTION Right 04/28/2020   XI ROBOTIC ASSISTED THORASCOPY-RIGHT LOWER LOBE WEDGE RESECTION (Right Chest)     Family History  Problem Relation Age of Onset   Alcohol abuse Father    Hyperlipidemia Father    Heart attack Brother 44   Hyperlipidemia Brother    Hyperlipidemia Sister        3 sisters   Heart attack Brother    Hyperlipidemia Brother     Social History Social History   Tobacco Use   Smoking status: Former Smoker    Quit date: 04/30/2012    Years since quitting: 8.0   Smokeless tobacco: Never Used  Vaping Use   Vaping Use: Former  Substance Use Topics   Alcohol use: Not Currently    Alcohol/week: 7.0 standard drinks    Types: 7 Shots of liquor per week   Drug use: Not Currently    Types: Marijuana    Comment: ocassionally, last time smoked 07/2017    Allergies  Allergen Reactions   Statins Other (See Comments)    MYALGIAS    Current Outpatient Medications  Medication Sig Dispense  Refill   Alirocumab (PRALUENT) 150 MG/ML SOAJ Inject 1 Dose into the skin every 14 (fourteen) days. 2 pen 11   clindamycin (CLEOCIN) 150 MG capsule Take 150-300 mg by mouth See admin instructions. 300 mg 1 hour before Dental appt, 150 mg 6 hours after appt     cyclobenzaprine (FLEXERIL) 10 MG tablet Take 10 mg by mouth at bedtime.      ibuprofen (ADVIL) 200 MG tablet Take 3 tablets (600 mg total) by mouth every 6 (six) hours as needed for headache or mild pain (for pain.). 30 tablet 0   loratadine (CLARITIN) 10 MG tablet Take 10 mg by mouth daily. AllerClear     Magnesium 500 MG TABS Take 500 mg by mouth in the morning, at noon, and at bedtime.      oxyCODONE-acetaminophen (PERCOCET/ROXICET) 5-325 MG tablet Take 1 tablet by mouth every 4 (four) hours as needed for severe pain. 50 tablet 0   No current facility-administered medications for this visit.    Review of Systems  Constitutional: positive for fatigue Eyes: negative Ears, nose, mouth, throat, and face: negative Respiratory: positive for cough and pleurisy/chest pain Cardiovascular: negative Gastrointestinal: negative Genitourinary:negative Integument/breast: negative Hematologic/lymphatic: negative Musculoskeletal:negative Neurological: negative Behavioral/Psych: negative Endocrine: negative Allergic/Immunologic: negative  Physical Exam  BMW:UXLKG, healthy, no distress, well nourished and well developed SKIN: skin color, texture, turgor are normal, no rashes or significant lesions HEAD: Normocephalic, No masses, lesions, tenderness or abnormalities EYES: normal, PERRLA, Conjunctiva are pink and non-injected EARS: External ears normal, Canals clear OROPHARYNX:no exudate, no erythema and lips, buccal mucosa, and tongue normal  NECK: supple, no adenopathy, no JVD LYMPH:  no palpable lymphadenopathy, no hepatosplenomegaly LUNGS: clear to auscultation , and palpation HEART: regular rate & rhythm, no murmurs and no  gallops ABDOMEN:abdomen soft, non-tender, normal bowel sounds and no masses or organomegaly BACK: No CVA tenderness, Range of motion is normal EXTREMITIES:no joint deformities, effusion, or inflammation, no edema  NEURO: alert & oriented x 3 with fluent speech, no focal motor/sensory deficits  PERFORMANCE STATUS: ECOG 1  LABORATORY DATA: Lab Results  Component Value Date   WBC 11.4 (H) 05/13/2020   HGB 14.9 05/13/2020   HCT 44.4 05/13/2020   MCV 92.5 05/13/2020  PLT 286 05/13/2020      Chemistry      Component Value Date/Time   NA 137 04/30/2020 0021   K 3.9 04/30/2020 0021   CL 104 04/30/2020 0021   CO2 24 04/30/2020 0021   BUN 20 04/30/2020 0021   CREATININE 0.95 04/30/2020 0021      Component Value Date/Time   CALCIUM 9.1 04/30/2020 0021   ALKPHOS 69 04/30/2020 0021   AST 30 04/30/2020 0021   ALT 30 04/30/2020 0021   BILITOT 1.1 04/30/2020 0021       RADIOGRAPHIC STUDIES: DG Chest 1 View  Result Date: 04/29/2020 CLINICAL DATA:  Pneumothorax surveillance EXAM: CHEST  1 VIEW COMPARISON:  04/28/2020 FINDINGS: Persistent large right-sided pneumothorax, similar in size compared to the previous study. Large-bore right-sided chest tube remains in place with distal tip oriented towards the right lung apex. Heart and mediastinal structures remain normally position without evidence of a tension component. Left basilar atelectasis with probable small left pleural effusion. IMPRESSION: 1. Persistent large right-sided pneumothorax. No evidence of tension component. 2. Left basilar atelectasis with probable small left pleural effusion. Electronically Signed   By: Davina Poke D.O.   On: 04/29/2020 08:03   DG Chest 2 View  Result Date: 05/07/2020 CLINICAL DATA:  Postoperative right lung wedge resection with chest tube in place EXAM: CHEST - 2 VIEW COMPARISON:  May 03, 2020 FINDINGS: Chest tube present on the right without pneumothorax. There is volume loss on the right with  mild right base atelectasis. There is no edema or airspace opacity. Heart size and pulmonary vascularity are normal. No adenopathy. There is a total shoulder replacement on the right. IMPRESSION: Chest tube on the right with postoperative change in volume loss on the right. No edema or airspace opacity. Left lung clear. Cardiac silhouette normal. Status post total shoulder replacement on the right. Electronically Signed   By: Lowella Grip III M.D.   On: 05/07/2020 14:15   DG Chest 2 View  Result Date: 04/26/2020 CLINICAL DATA:  Preoperative study for right lower lobe wedge resection. EXAM: CHEST - 2 VIEW COMPARISON:  PET-CT dated March 10, 2020. Chest x-ray dated February 24, 2020. FINDINGS: The heart size and mediastinal contours are within normal limits. Normal pulmonary vascularity. Medial right lower lobe mass, better evaluated on cross-sectional imaging. No focal consolidation, pleural effusion, or pneumothorax. No acute osseous abnormality. IMPRESSION: 1. No acute cardiopulmonary disease. Electronically Signed   By: Titus Dubin M.D.   On: 04/26/2020 15:40   DG Chest Port 1 View  Result Date: 05/03/2020 CLINICAL DATA:  Right chest tube EXAM: PORTABLE CHEST 1 VIEW COMPARISON:  Earlier same day FINDINGS: Persistent but decreased right pneumothorax. Apically directed chest tube remains present. Mild atelectasis at the left lung base. The left costophrenic angle is excluded. Normal heart size. Residual air within the right chest wall. IMPRESSION: Persistent but decreased right pneumothorax. Electronically Signed   By: Macy Mis M.D.   On: 05/03/2020 12:13   DG CHEST PORT 1 VIEW  Result Date: 05/03/2020 CLINICAL DATA:  Right-sided pneumothorax EXAM: PORTABLE CHEST 1 VIEW COMPARISON:  May 02, 2020 FINDINGS: Chest tubes on the right are unchanged in position. The previously noted pneumothorax on the right is similar in size and contour to previous study without tension component. There is  subcutaneous air on the right inferolaterally. There is atelectatic change in the left base with small left pleural effusion. Lungs elsewhere clear. Heart is mildly enlarged with pulmonary vascularity normal. No  adenopathy. Patient is status post total shoulder replacement on the right. IMPRESSION: Essentially stable pneumothorax on the right with chest tubes in place. No appreciable tension component. There is subcutaneous air on the right inferolaterally. Small left pleural effusion with left base atelectasis. Lungs elsewhere clear. Stable cardiac prominence. Electronically Signed   By: Bretta Bang III M.D.   On: 05/03/2020 08:09   DG CHEST PORT 1 VIEW  Result Date: 05/02/2020 CLINICAL DATA:  Right lower lobe wedge resection.  No pneumothorax. EXAM: PORTABLE CHEST 1 VIEW COMPARISON:  May 01, 2020 FINDINGS: A right chest tube remains in place. The right-sided pneumothorax is larger in the interval measuring up to 4 cm at the apex today versus 0.6 cm previously. The pneumothorax is now seen extending inferiorly and laterally to the costophrenic angle. The cardiomediastinal silhouette is normal. The left lung is clear. No other acute abnormalities. IMPRESSION: 1. A right chest tube remains in place. The right-sided pneumothorax is significantly larger in the interval. The pneumothorax still remains relatively small while it was tiny previously. These results will be called to the ordering clinician or representative by the Radiologist Assistant, and communication documented in the PACS or [employer]. Electronically Signed   By: Gerome Sam III M.D   On: 05/02/2020 08:46   DG CHEST PORT 1 VIEW  Result Date: 05/01/2020 CLINICAL DATA:  Surgery follow-up EXAM: PORTABLE CHEST 1 VIEW COMPARISON:  April 30, 2020 FINDINGS: A tiny right apical pneumothorax is more pronounced in the interval measuring 6 mm today. A right chest tube remains in similar position. No left-sided pneumothorax identified.  Opacity in left base is stable, probably atelectasis. Cardiomediastinal silhouette is stable. IMPRESSION: 1. A right chest tube remains in place. There is a tiny right-sided pneumothorax which is more pronounced in the interval. Recommend attention on follow-up. 2. Left basilar opacity is likely atelectasis. Electronically Signed   By: Gerome Sam III M.D   On: 05/01/2020 10:26   DG CHEST PORT 1 VIEW  Result Date: 04/30/2020 CLINICAL DATA:  Post robotic assisted thoracoscopy and RIGHT lung lobectomy, chest tube EXAM: PORTABLE CHEST 1 VIEW COMPARISON:  Portable exam 0640 hours compared to 04/29/2020 FINDINGS: RIGHT thoracostomy tube unchanged. Normal heart size, mediastinal contours, and pulmonary vascularity. Mild bibasilar atelectasis greater on LEFT. Remaining lungs clear. Near complete resolution of previously identified RIGHT pneumothorax, tiny residual component laterally. No pleural effusion or acute osseous findings. IMPRESSION: Near complete resolution of previously identified RIGHT pneumothorax. Bibasilar atelectasis, greater on LEFT. Electronically Signed   By: Ulyses Southward M.D.   On: 04/30/2020 08:17   DG Chest Port 1 View  Result Date: 04/28/2020 CLINICAL DATA:  Status post robot assisted thoracoscopy, right-sided chest tube insertion EXAM: PORTABLE CHEST 1 VIEW COMPARISON:  04/26/2020 FINDINGS: Single frontal view of the chest demonstrates postsurgical changes from right thoracoscopy with subcutaneous gas in the right lateral chest wall. There is a large right pneumothorax volume estimated in excess of 50%, with no mediastinal shift or tension effect. Right-sided chest tube overlies the right hemithorax, tip at the right apex. There is mild vascular redistribution within the left lung. Patchy consolidation at the left lung base likely reflects atelectasis. No acute bony abnormalities. IMPRESSION: 1. Large right-sided pneumothorax consistent with recent thoracoscopy. Indwelling chest tube as  above. No tension effect or midline shift. 2. Left basilar consolidation likely atelectasis. Increased pulmonary vasculature within the left lung consistent with vascular redistribution. These results were called by telephone at the time of interpretation on 04/28/2020  at 3:16pm to patient's nurse, Chat, who verbally acknowledged these results. Electronically Signed   By: Randa Ngo M.D.   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 Kipp Brood on 04/28/2020.   PLAN: I had a lengthy discussion with the patient and his wife today about his current disease stage, prognosis and treatment options. I personally and independently reviewed his medical record including the imaging of the scans as well as the pathology report. I discussed with the patient the benefit of adjuvant systemic therapy and patient with a stage IIIa non-small cell lung cancer. I also discussed with the patient the option of molecular studies to identify any actionable mutation especially EGFR mutation as the patient may be candidate for adjuvant osimertinib if detected. I will complete the staging work-up by ordering MRI of the brain to rule out brain metastasis. I discussed with the patient enrollment in the alchemist trial where the tissue block will be sent for molecular studies followed by consideration of enrollment in the alliance trial with targeted therapy if he has any actionable mutations for EGFR or ALK versus a stander systemic chemotherapy with platinum and pemetrexed plus minus immunotherapy if he has no actionable mutations. The patient agreed to proceed with the alchemist trial and he will be seen by the research nurse  later today. I will arrange for him to come back for follow-up visit in around 3 weeks for evaluation and discussion of the molecular studies and further treatment options. The patient was advised to call immediately if he has any concerning symptoms in the interval. The patient voices understanding of current disease status and treatment options and is in agreement with the current care plan.  All questions were answered. The patient knows to call the clinic with any problems, questions or concerns. We can certainly see the patient much sooner if necessary.  Thank you so much for allowing me to participate in the care of [facility]. I will continue to follow up the patient with you and assist in his care.  The total time spent in the appointment was 70 minutes.  Disclaimer: This note was dictated with voice recognition software. Similar sounding words can inadvertently be transcribed and may not be corrected upon review.   Eilleen Kempf May 13, 2020, 3:12 PM

## 2020-05-13 NOTE — Research (Signed)
ALCHEMIST [id number]: ADJUVANT LUNG CANCER ENRICHMENT MARKER IDENTIFICATION AND SEQUENCING TRIAL  05/13/2020 3:50PM  CONSENT: After a referral from Dr. Julien Nordmann, I met with [name] [name] accompanied by his wife, [name] [name] in exam room 20 for 30 minutes to discuss the ALCHEMIST study.We reviewedtheALCHEMISTconsent (protocol version date9/08/2020[facility] Active Date10/09/2018)andHIPPAform(dated8/04/2013)with patient in their entirety. Explained the purpose of the study along with potential risks and benefits of participation. Reviewed the study required assessments and timeline for completing these assessments. Informed patient that participation is completely voluntary andhemay withdraw consent at any time.Upon completion ofreview, patientand his wifewereoffered to ask any questions or express any concerns.He asked if participating in a clinical trial would delay his treatment in any way. I explained that a clinical trial would not delay treatment. [name] [name] and his wife had no further questions and expressed understanding. [name] [name] signedand dated the consentand HIPPA form voluntarily. Patientagreedto the optional use of hisbloodand tissuefor future research. Copies of signed/dated consent andHIPPAformwill begiven to patient forhisrecordson his follow up appointment (to be scheduled).Eligibility double check still to be confirmed, but initial check confirmed by myself. Charon and his wife were thanked for their time and I will keep in touch about the next steps. A copy of DCP consent and HIPPA forms were given to patient and his wife for review.  TISSUE REQUEST: Tissue request was sent to [facility].  Carol Ada, RT (R)(T) Clinical Research Coordinator 05/13/20  4:34PM

## 2020-05-14 ENCOUNTER — Other Ambulatory Visit: Payer: Self-pay

## 2020-05-14 ENCOUNTER — Ambulatory Visit
Admission: RE | Admit: 2020-05-14 | Discharge: 2020-05-14 | Disposition: A | Discharge status: Home / Self Care | Payer: Medicare Other | Source: Ambulatory Visit | Attending: Thoracic Surgery (Cardiothoracic Vascular Surgery) | Admitting: Thoracic Surgery (Cardiothoracic Vascular Surgery)

## 2020-05-14 ENCOUNTER — Ambulatory Visit (INDEPENDENT_AMBULATORY_CARE_PROVIDER_SITE_OTHER): Payer: Self-pay | Admitting: Thoracic Surgery (Cardiothoracic Vascular Surgery)

## 2020-05-14 ENCOUNTER — Encounter: Payer: Self-pay | Admitting: Thoracic Surgery (Cardiothoracic Vascular Surgery)

## 2020-05-14 VITALS — BP 120/83 | HR 90 | Temp 97.8°F | Resp 20 | Ht 73.0 in | Wt 210.0 lb

## 2020-05-14 DIAGNOSIS — R918 Other nonspecific abnormal finding of lung field: Secondary | ICD-10-CM

## 2020-05-14 DIAGNOSIS — C3431 Malignant neoplasm of lower lobe, right bronchus or lung: Secondary | ICD-10-CM

## 2020-05-14 DIAGNOSIS — J9382 Other air leak: Secondary | ICD-10-CM

## 2020-05-14 DIAGNOSIS — Z9889 Other specified postprocedural states: Secondary | ICD-10-CM

## 2020-05-14 LAB — CMP (CANCER CENTER ONLY)
ALT: 21 U/L (ref 0–44)
AST: 17 U/L (ref 15–41)
Albumin: 3.7 g/dL (ref 3.5–5.0)
Alkaline Phosphatase: 100 U/L (ref 38–126)
Anion gap: 8 (ref 5–15)
BUN: 19 mg/dL (ref 8–23)
CO2: 28 mmol/L (ref 22–32)
Calcium: 9.5 mg/dL (ref 8.9–10.3)
Chloride: 103 mmol/L (ref 98–111)
Creatinine: 0.91 mg/dL (ref 0.61–1.24)
GFR, Est AFR Am: >60 mL/min (ref >60)
GFR, Estimated: >60 mL/min (ref >60)
Glucose, Bld: 105 mg/dL — ABNORMAL HIGH (ref 70–99)
Potassium: 4.4 mmol/L (ref 3.5–5.1)
Sodium: 139 mmol/L (ref 135–145)
Total Bilirubin: 0.6 mg/dL (ref 0.3–1.2)
Total Protein: 7.1 g/dL (ref 6.5–8.1)

## 2020-05-14 MED ORDER — HYDROCODONE-ACETAMINOPHEN 5-325 MG PO TABS
1.0000 | ORAL_TABLET | Freq: Four times a day (QID) | ORAL | 0 refills | Status: DC | PRN
Start: 2020-05-14 — End: 2020-05-27

## 2020-05-14 NOTE — Progress Notes (Signed)
      [address]       [place] [address]             [phone]        Pratik Peplinski [place] Medical Record #[id number] Date of Birth: 09-Dec-1950  Referring: Collene Gobble, MD Primary Care: Gaynelle Arabian, MD Primary Cardiologist:No primary care provider on file.  Reason for visit:   follow-up  History of Present Illness:     Mr. [place] presents for a 1 week follow-up appointment.  He denies any shortness of breath.  His only noted occasional gurgling.  Physical Exam: BP 120/83   Pulse 90   Temp 97.8 F (36.6 C) (Skin)   Resp 20   Ht 6\' 1"  (1.854 m)   Wt 210 lb (95.3 kg)   SpO2 96% Comment: RA  BMI 27.71 kg/m   Alert NAD Incision clean.  Chest tube was removed.  Covered with Xeroform gauze.  Abdomen soft, ND No peripheral edema   Diagnostic Studies & Laboratory data: CXR: Small right effusion.     Assessment / Plan:   69 year old male status post robotic assisted right lower lobectomy for a 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  Chest tube was removed today. I will see him back in 1 week with a repeat chest x-ray.  His pathology was reviewed again with oncology to discuss adjuvant therapy.    Lajuana Matte 05/14/2020 2:56 PM

## 2020-05-14 NOTE — Progress Notes (Signed)
Stat report from [facility] with [facility] called about patient's xray, today.  Dr. Kipp Brood to look at results.

## 2020-05-18 ENCOUNTER — Encounter: Payer: Self-pay | Admitting: *Deleted

## 2020-05-18 ENCOUNTER — Encounter: Payer: Self-pay | Admitting: Radiology

## 2020-05-18 ENCOUNTER — Other Ambulatory Visit: Payer: Self-pay | Admitting: Internal Medicine

## 2020-05-18 ENCOUNTER — Telehealth: Payer: Self-pay | Admitting: *Deleted

## 2020-05-18 DIAGNOSIS — C3490 Malignant neoplasm of unspecified part of unspecified bronchus or lung: Secondary | ICD-10-CM

## 2020-05-18 MED ORDER — LORAZEPAM 0.5 MG PO TABS: ORAL_TABLET | ORAL | 0 refills | Status: DC

## 2020-05-18 NOTE — Progress Notes (Signed)
I followed up on [name] MRI brain.  The scan is not authorized. I contacted managed care to help expedite auth.

## 2020-05-18 NOTE — Telephone Encounter (Signed)
I will send Ativan to his pharmacy.  Thank you.

## 2020-05-18 NOTE — Telephone Encounter (Signed)
Received vm call from pt asking for medication prior to MRI 05/24/20 @ 7 pm.  He reports being claustrophobic. Message routed to Dr Julien Nordmann.

## 2020-05-18 NOTE — Research (Signed)
ALCHEMIST [id number]: ADJUVANT LUNG CANCER ENRICHMENT MARKER IDENTIFICATION AND SEQUENCING TRIAL  05/18/2020 12:45PM  TISSUE CONFIRMATION AND REGISTRATION: Received tissue and registered patient into OPEN (Step 0 and Step 1). Patient given PID: [id number]. Will send patient's tissue today.   Carol Ada, RT(R)(T) Clinical Research Coordinator

## 2020-05-19 ENCOUNTER — Other Ambulatory Visit: Payer: Self-pay | Admitting: Thoracic Surgery (Cardiothoracic Vascular Surgery)

## 2020-05-19 ENCOUNTER — Encounter: Payer: Self-pay | Admitting: Radiology

## 2020-05-19 DIAGNOSIS — C3490 Malignant neoplasm of unspecified part of unspecified bronchus or lung: Secondary | ICD-10-CM

## 2020-05-19 NOTE — Research (Signed)
ALCHEMIST [id number]: ADJUVANT LUNG CANCER ENRICHMENT MARKER IDENTIFICATION AND SEQUENCING TRIAL  05/19/20  10:08AM  CONFIRMATION OF RECEIPT: Tissue received on 05/19/20 at 9:53 AM by ADAMSCORSO. [employer] Specimen ID: [id number].   Carol Ada, RT(R)(T) Clinical Research Coordinator

## 2020-05-19 NOTE — Telephone Encounter (Signed)
Pt notified to pick up rx.

## 2020-05-20 ENCOUNTER — Telehealth: Payer: Self-pay | Admitting: Internal Medicine

## 2020-05-20 NOTE — Telephone Encounter (Signed)
Scheduled per los. Called and spoke with patient. Confirmed appt 

## 2020-05-21 ENCOUNTER — Ambulatory Visit (INDEPENDENT_AMBULATORY_CARE_PROVIDER_SITE_OTHER): Payer: Self-pay | Admitting: Thoracic Surgery (Cardiothoracic Vascular Surgery)

## 2020-05-21 ENCOUNTER — Encounter: Payer: Self-pay | Admitting: Thoracic Surgery (Cardiothoracic Vascular Surgery)

## 2020-05-21 ENCOUNTER — Other Ambulatory Visit: Payer: Self-pay

## 2020-05-21 ENCOUNTER — Ambulatory Visit
Admission: RE | Admit: 2020-05-21 | Discharge: 2020-05-21 | Disposition: A | Discharge status: Home / Self Care | Payer: Medicare Other | Source: Ambulatory Visit | Attending: Thoracic Surgery (Cardiothoracic Vascular Surgery) | Admitting: Thoracic Surgery (Cardiothoracic Vascular Surgery)

## 2020-05-21 VITALS — BP 112/69 | HR 100 | Temp 97.8°F | Resp 20 | Ht 73.0 in | Wt 209.0 lb

## 2020-05-21 DIAGNOSIS — C3490 Malignant neoplasm of unspecified part of unspecified bronchus or lung: Secondary | ICD-10-CM

## 2020-05-21 DIAGNOSIS — C3431 Malignant neoplasm of lower lobe, right bronchus or lung: Secondary | ICD-10-CM

## 2020-05-21 DIAGNOSIS — Z09 Encounter for follow-up examination after completed treatment for conditions other than malignant neoplasm: Secondary | ICD-10-CM

## 2020-05-21 NOTE — Progress Notes (Signed)
[address]       [place] [address]             [phone]        Mickey Gwinner [place] Medical Record #[id number] Date of Birth: [birth year]  Referring: Collene Gobble, MD Primary Care: Gaynelle Arabian, MD Primary Cardiologist:No primary care provider on file.  Reason for visit:   follow-up  History of Present Illness:     The patient is doing well.  The chest tube stitch was removed.  Denies any shortness of breath but occasionally has some pain the right chest.  Physical Exam: BP 112/69   Pulse 100   Temp 97.8 F (36.6 C) (Skin)   Resp 20   Ht $R'6\' 1"'Ff$  (1.854 m)   Wt 209 lb (94.8 kg)   SpO2 97% Comment: RA  BMI 27.57 kg/m   Alert NAD Incision clean.   Abdomen soft, ND No peripheral edema   Diagnostic Studies & Laboratory data: CXR: Clear, no pneumothorax.     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  Has met with Dr. Julien Nordmann and will be started on adjuvant therapy.  He will call us back to let us know if he wants to follow-up with [place].   Lajuana Matte 05/21/2020 3:48 PM

## 2020-05-24 ENCOUNTER — Ambulatory Visit (HOSPITAL_COMMUNITY)
Admission: RE | Admit: 2020-05-24 | Discharge: 2020-05-24 | Disposition: A | Discharge status: Home / Self Care | Payer: Medicare Other | Source: Ambulatory Visit | Attending: Internal Medicine | Admitting: Internal Medicine

## 2020-05-24 ENCOUNTER — Other Ambulatory Visit: Payer: Self-pay

## 2020-05-24 DIAGNOSIS — C3490 Malignant neoplasm of unspecified part of unspecified bronchus or lung: Secondary | ICD-10-CM | POA: Diagnosis present

## 2020-05-24 MED ORDER — GADOBUTROL 1 MMOL/ML IV SOLN
9.0000 mL | Freq: Once | INTRAVENOUS | Status: AC | PRN
  Administered 2020-05-24: 9 mL via INTRAVENOUS

## 2020-05-25 ENCOUNTER — Telehealth: Payer: Self-pay

## 2020-05-25 NOTE — Telephone Encounter (Signed)
-----   Message from Lajuana Matte, MD sent at 05/25/2020  4:15 PM EDT ----- Regarding: RE: pain med refill Contact: [phone] That's fine. ----- Message ----- From: Marylen Ponto, LPN Sent: 1/59/4707   4:11 PM EDT To: Lajuana Matte, MD Subject: pain med refill                                [name] [name] is calling requesting a refill on the pain medication. Hydrocodone-ace 5/325 mg  His [facility] is [facility] on [address]. In epic chart. Please advise [name] [name]

## 2020-05-27 ENCOUNTER — Other Ambulatory Visit: Payer: Self-pay | Admitting: *Deleted

## 2020-05-27 ENCOUNTER — Other Ambulatory Visit: Payer: Self-pay | Admitting: Thoracic Surgery (Cardiothoracic Vascular Surgery)

## 2020-05-27 ENCOUNTER — Other Ambulatory Visit: Payer: Self-pay | Admitting: Physician Assistant

## 2020-05-27 DIAGNOSIS — Z006 Encounter for examination for normal comparison and control in clinical research program: Secondary | ICD-10-CM

## 2020-05-27 MED ORDER — HYDROCODONE-ACETAMINOPHEN 5-325 MG PO TABS
1.0000 | ORAL_TABLET | Freq: Four times a day (QID) | ORAL | 0 refills | Status: DC | PRN
Start: 2020-05-27 — End: 2020-06-17

## 2020-06-01 ENCOUNTER — Encounter: Payer: Self-pay | Admitting: Radiology

## 2020-06-01 DIAGNOSIS — C3490 Malignant neoplasm of unspecified part of unspecified bronchus or lung: Secondary | ICD-10-CM

## 2020-06-01 NOTE — Research (Signed)
ALCHEMIST [id number]: ADJUVANT LUNG CANCER ENRICHMENT MARKER IDENTIFICATION AND SEQUENCING TRIAL  06/01/20  11:00AM  **Late entry**  PHONE CALL (05/31/20): Confirmed I was speaking with [name]. Informed patient I was calling to follow-up with questions from the [facility] research study. I asked patient all questions from the packet and recorded just as patient answered. I entered this data in [place]. I thanked patient for his time and let him know we would be seeing him again soon.   PLAN: Still awaiting EGFR results. Current results are PDL-1 negative and ALK negative. Once results return, we will be able to see if he is eligible for [facility].   Carol Ada, RT(R)(T) Clinical Research Coordinator

## 2020-06-03 ENCOUNTER — Inpatient Hospital Stay: Payer: Medicare Other

## 2020-06-03 ENCOUNTER — Inpatient Hospital Stay: Payer: Medicare Other | Admitting: Physician Assistant

## 2020-06-03 ENCOUNTER — Encounter: Payer: Self-pay | Admitting: *Deleted

## 2020-06-04 LAB — LIPID PANEL
Chol/HDL Ratio: 3.9 ratio (ref 0.0–5.0)
Cholesterol, Total: 143 mg/dL (ref 100–199)
HDL: 37 mg/dL — ABNORMAL LOW (ref >39)
LDL Chol Calc (NIH): 87 mg/dL (ref 0–99)
Triglycerides: 103 mg/dL (ref 0–149)
VLDL Cholesterol Cal: 19 mg/dL (ref 5–40)

## 2020-06-04 NOTE — Progress Notes (Signed)
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  PRIOR THERAPY:  1) Status post right lower lobectomy with lymph node dissection under the care of Dr. Kipp Brood on 04/28/2020.  CURRENT THERAPY: Adjuvant systemic chemotherapy with cisplatin 80 mg per metered squared and Alimta 500 mg per metered squared IV every 3 weeks.  First dose expected on 06/16/2020. +/- Immunotherapy with Lamont Dowdy if the patient enrolls in the clinical trial   INTERVAL HISTORY: [name] [name] 69 y.o. male returns to the clinic today for a follow-up visit accompanied by his wife.  The patient was recently diagnosed with adenocarcinoma stage IIIa.  He is status post right lower lobectomy with lymph node dissection.  His tumor was 8 cm with invasion of the visceral pleura.  Therefore, Dr. Julien Nordmann recommend for the patient to have adjuvant chemotherapy.  He is eligible for the alchemist trial and had molecular testing performed.  Today, the patient is feeling well except for occasional sharp pain near the incision site of the right chest wall.  He denies any recent fever, chills, night sweats, or weight loss.  He denies any cough or hemoptysis. He reports some dyspnea on exertion. He denies any nausea, vomiting, diarrhea, or constipation.  He denies any headache or visual changes.  The patient recently had a initial staging brain MRI performed.  The patient is here today for evaluation and more detailed discussion about his current condition and treatment options.   MEDICAL HISTORY: Past Medical History:  Diagnosis Date  . Arthritis   . Cancer (HCC)    RIGHT LUNG  . Carpal tunnel syndrome    Right  . Cataracts, bilateral    very mild  . Early cataracts, bilateral   . Fatty liver   .  Gallstones   . GERD (gastroesophageal reflux disease)   . Headache 2011   HX OF VISUAL MIGRAINES - NO PAIN --MIGRAINES ATTRIBUTED TO PT CUTTING NICOTINE PATCH IN HALF BEFORE APPLYING TO HIS SKIN.    Marland Kitchen Hepatitis 2001   C -treated and no problems since  2001 'cured'  . History of hiatal hernia   . History of kidney stones    found on CT Scan  . HOH (hard of hearing)   . Muscle cramps    hands and legs  . Osteopenia   . Pain    KNEE OA  . Right lower lobe lung mass   . Shortness of breath dyspnea    "SOMETIMES FEELS LIKE I CAN'T CATCH A DEEP BREATH "- ON GOING FOR YEARS - [facility] WORK UP 2011 - [place]  . Stroke ([place])    HX POSSIBLE TIA - RT LEG NUMBNESS AND TINGLING FINGERS AND NUMBNESS RT FACE - ALL RESOLVED ; STATES NEUROLOGIST COULD NOT FIND ANY EVIDENCE OF STROKE - THOUGHT PT'S EPISODE MIGHT BE FROM CUTTING NICOTINE PATCH IN HALF AND APPLYING TO SKIN  . Trigger finger    LEFT MIDDLE FINGER  . Wears glasses     ALLERGIES:  is allergic to statins.  MEDICATIONS:  Current Outpatient Medications  Medication Sig Dispense Refill  . acetaminophen (TYLENOL) 500 MG tablet Take 500 mg by mouth every 6 (six) hours as needed.    . Alirocumab (PRALUENT) 150 MG/ML SOAJ Inject  1 Dose into the skin every 14 (fourteen) days. (Patient taking differently: Inject 1 Dose into the skin every 14 (fourteen) days. ) 2 pen 11  . cyclobenzaprine (FLEXERIL) 10 MG tablet Take 10 mg by mouth at bedtime.     . diphenhydrAMINE (BENADRYL) 50 MG tablet Take 50 mg by mouth at bedtime as needed for itching.    Marland Kitchen HYDROcodone-acetaminophen (NORCO/VICODIN) 5-325 MG tablet Take 1 tablet by mouth every 6 (six) hours as needed for moderate pain. 30 tablet 0  . ibuprofen (ADVIL) 200 MG tablet Take 3 tablets (600 mg total) by mouth every 6 (six) hours as needed for headache or mild pain (for pain.). 30 tablet 0  . loratadine (CLARITIN) 10 MG tablet Take 10 mg by mouth daily.  AllerClear    . LORazepam (ATIVAN) 0.5 MG tablet 1 tablet 30 minutes before the MRI.  Repeat once if needed. 2 tablet 0  . Magnesium 500 MG TABS Take 500 mg by mouth in the morning, at noon, and at bedtime.     . clindamycin (CLEOCIN) 150 MG capsule Take 150-300 mg by mouth See admin instructions. 300 mg 1 hour before Dental appt, 150 mg 6 hours after appt (Patient not taking: Reported on 06/08/2020)    . folic acid (FOLVITE) 1 MG tablet Take 1 tablet (1 mg total) by mouth daily. 30 tablet 2  . prochlorperazine (COMPAZINE) 10 MG tablet Take 1 tablet (10 mg total) by mouth every 6 (six) hours as needed. 30 tablet 2   No current facility-administered medications for this visit.    SURGICAL HISTORY:  Past Surgical History:  Procedure Laterality Date  . BRONCHIAL BIOPSY  02/24/2020   Procedure: BRONCHIAL BIOPSIES;  Surgeon: Collene Gobble, MD;  Location[facility] ENDOSCOPY;  Service: Pulmonary;;  . BRONCHIAL BRUSHINGS  02/24/2020   Procedure: BRONCHIAL BRUSHINGS;  Surgeon: Collene Gobble, MD;  Location: [facility] ENDOSCOPY;  Service: Pulmonary;;  . BRONCHIAL NEEDLE ASPIRATION BIOPSY  02/24/2020   Procedure: BRONCHIAL NEEDLE ASPIRATION BIOPSIES;  Surgeon: Collene Gobble, MD;  Location: [facility] ENDOSCOPY;  Service: Pulmonary;;  . BRONCHIAL WASHINGS  02/24/2020   Procedure: BRONCHIAL WASHINGS;  Surgeon: Collene Gobble, MD;  Location: [facility] ENDOSCOPY;  Service: Pulmonary;;  . COLONOSCOPY    . INTERCOSTAL NERVE BLOCK Right 04/28/2020   Procedure: INTERCOSTAL NERVE BLOCK;  Surgeon: Lajuana Matte, MD;  Location: [facility];  Service: Thoracic;  Laterality: Right;  . left ankle surgery     fractured  . left knee surgery     MENISCAL REMOVED  . left shoulder     torn ligaments left shoulder  . LUMBAR FUSION     L3 L4 L5  . NODE DISSECTION Right 04/28/2020   Procedure: NODE DISSECTION;  Surgeon: Lajuana Matte, MD;  Location: [facility];  Service: Thoracic;  Laterality: Right;  . RIGHT CARPAL TUNNEL RELEASE  2011  .  RIGHT KNEE ARTHROSCOPY   AUG OR SEPT 2015   AT [facility]  . ROOT CANAL    . THORACOSCOPY Right 04/28/2020   XI ROBOTIC ASSISTED THORASCOPY-LOBECTOMY (Right)  . TONSILLECTOMY     as child  . 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 TOTAL KNEE ARTHROPLASTY Right 07/21/2014   Procedure: RIGHT TOTAL KNEE ARTHROPLASTY;  Surgeon: Mauri Pole, MD;  Location: [facility];  Service: Orthopedics;  Laterality: Right;  .  TOTAL SHOULDER ARTHROPLASTY Right 10/17/2019   Procedure: RIGHT REVERSE TOTAL SHOULDER REPLACEMENT;  Surgeon: Netta Cedars, MD;  Location: [facility];  Service: Orthopedics;  Laterality: Right;  interscalene block  . TRIGGER FINGER RELEASE Left   . VIDEO BRONCHOSCOPY WITH ENDOBRONCHIAL NAVIGATION Right 02/24/2020   Procedure: VIDEO BRONCHOSCOPY WITH ENDOBRONCHIAL NAVIGATION;  Surgeon: Collene Gobble, MD;  Location: [facility] ENDOSCOPY;  Service: Pulmonary;  Laterality: Right;  . WEDGE RESECTION Right 04/28/2020   XI ROBOTIC ASSISTED THORASCOPY-RIGHT LOWER LOBE WEDGE RESECTION (Right Chest)     REVIEW OF SYSTEMS:   Review of Systems  Constitutional: Negative for appetite change, chills, fatigue, fever and unexpected weight change.  HENT: Negative for mouth sores, nosebleeds, sore throat and trouble swallowing.   Eyes: Negative for eye problems and icterus.  Respiratory: Positive for dyspnea on exertion. Negative for cough, hemoptysis, and wheezing.   Cardiovascular: Positive for sharp chest wall pain near incision site. Negative for leg swelling.  Gastrointestinal: Negative for abdominal pain, constipation, diarrhea, nausea and vomiting.  Genitourinary: Negative for bladder incontinence, difficulty urinating, dysuria, frequency and hematuria.   Musculoskeletal: Negative for back pain, gait problem, neck pain and neck stiffness.  Skin: Negative for  itching and rash.  Neurological: Negative for dizziness, extremity weakness, gait problem, headaches, light-headedness and seizures.  Hematological: Negative for adenopathy. Does not bruise/bleed easily.  Psychiatric/Behavioral: Negative for confusion, depression and sleep disturbance. The patient is not nervous/anxious.     PHYSICAL EXAMINATION:  Blood pressure 117/76, pulse 95, temperature 98.8 F (37.1 C), temperature source Tympanic, resp. rate 18, height 6' 1" (1.854 m), weight 210 lb 4.8 oz (95.4 kg), SpO2 97 %.  ECOG PERFORMANCE STATUS: 1 - Symptomatic but completely ambulatory  Physical Exam  Constitutional: Oriented to person, place, and time and well-developed, well-nourished, and in no distress.  HENT:  Head: Normocephalic and atraumatic.  Mouth/Throat: Oropharynx is clear and moist. No oropharyngeal exudate.  Eyes: Conjunctivae are normal. Right eye exhibits no discharge. Left eye exhibits no discharge. No scleral icterus.  Neck: Normal range of motion. Neck supple.  Cardiovascular: Normal rate, regular rhythm, normal heart sounds and intact distal pulses.   Pulmonary/Chest: Effort normal and breath sounds normal. No respiratory distress. No wheezes. No rales.  Abdominal: Soft. Bowel sounds are normal. Exhibits no distension and no mass. There is no tenderness.  Musculoskeletal: Normal range of motion. Exhibits no edema.  Lymphadenopathy:    No cervical adenopathy.  Neurological: Alert and oriented to person, place, and time. Exhibits normal muscle tone. Gait normal. Coordination normal.  Skin: Skin is warm and dry. No rash noted. Not diaphoretic. No erythema. No pallor.  Psychiatric: Mood, memory and judgment normal.  Vitals reviewed.  LABORATORY DATA: Lab Results  Component Value Date   WBC 8.1 06/08/2020   HGB 14.4 06/08/2020   HCT 43.5 06/08/2020   MCV 89.1 06/08/2020   PLT 220 06/08/2020      Chemistry      Component Value Date/Time   NA 136 06/08/2020 1403    K 4.7 06/08/2020 1403   CL 106 06/08/2020 1403   CO2 25 06/08/2020 1403   BUN 18 06/08/2020 1403   CREATININE 0.95 06/08/2020 1403      Component Value Date/Time   CALCIUM 9.3 06/08/2020 1403   ALKPHOS 87 06/08/2020 1403   AST 19 06/08/2020 1403   ALT 27 06/08/2020 1403   BILITOT 1.0 06/08/2020 1403       RADIOGRAPHIC STUDIES:  DG Chest 2 View  Result Date:  05/21/2020 CLINICAL DATA:  Recent right lower lobectomy for lung cancer. No chest complaints. EXAM: CHEST - 2 VIEW COMPARISON:  Chest x-ray dated May 14, 2020. FINDINGS: Interval removal of the right-sided chest tube. No pneumothorax. Similar appearing postsurgical changes and volume loss at the right lung base related to prior right lower lobectomy. The left lung is clear. The heart size and mediastinal contours are within normal limits. Normal pulmonary vascularity. No acute osseous abnormality. IMPRESSION: 1. Interval removal of the right-sided chest tube. No pneumothorax. Electronically Signed   By: Titus Dubin M.D.   On: 05/21/2020 13:27   DG Chest 2 View  Result Date: 05/14/2020 CLINICAL DATA:  Right lower lobe mass. EXAM: CHEST - 2 VIEW COMPARISON:  Chest x-ray 03/30/2020. FINDINGS: Chest tube in stable position. Very tiny right apical pneumothorax cannot be completely excluded. Tiny right pleural effusion. Postsurgical changes right lung. Left lung is clear. Heart size normal. Total shoulder replacement on the right. Degenerative change thoracic spine. IMPRESSION: Right chest tube in stable position. Very tiny right apical pneumothorax cannot be completely excluded. Tiny right pleural effusion. Postsurgical changes right lung. These results will be called to the ordering clinician or representative by the Radiologist Assistant, and communication documented in the PACS or [employer]. Electronically Signed   By: Marcello Moores  Register   On: 05/14/2020 11:14   MR BRAIN W WO CONTRAST  Result Date: 05/25/2020 CLINICAL  DATA:  Non-small cell lung cancer staging EXAM: MRI HEAD WITHOUT AND WITH CONTRAST TECHNIQUE: Multiplanar, multiecho pulse sequences of the brain and surrounding structures were obtained without and with intravenous contrast. CONTRAST:  78m GADAVIST GADOBUTROL 1 MMOL/ML IV SOLN COMPARISON:  10/30/2007 MRI report FINDINGS: Brain: No acute infarction, hemorrhage, hydrocephalus, extra-axial collection or mass lesion. Few remote white matter insults, nonspecific. Normal brain volume. Vascular: Normal flow voids and vascular enhancements Skull and upper cervical spine: No focal marrow lesion. C4-5 ligamentum flavum thickening impinging on the posterior cord. Sinuses/Orbits: Negative for mass IMPRESSION: Negative for metastatic disease. Electronically Signed   By: JMonte FantasiaM.D.   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 LKipp Broodon 04/28/2020.  His PD-L1 expression is negative.  He is negative for any actionable mutations; however, his EGFR mutation testing is still pending.  The patient was seen with Dr. MJulien Nordmanntoday. It is unclear if the patient will be a canidate for the alchemist trial. He is meeting with a member of the research team today to discuss the other trial arms if his molecular studies are negative. In the meantime, Dr. MJulien Nordmanndiscussed the standard of care chemotherapy with the patient with cisplatin and Alimta IV every 3 weeks.  The patient is interested in this option and he is expected to receive his first dose of treatment on 06/16/20. If he is a candidate for targeted therapy, we will call him back before he starts treatment to discuss this option. If he is negative for  EGFR, then he may be interested in the chemotherapy +/- immunotherapy arm of the trial.   The adverse side effects of treatment were discussed including but not limited to alopecia, myelosuppression, nausea, vomiting, liver, and renal dysfunction.  I will arrange for  the patient to have a chemo education class prior to starting his first cycle of treatment.  I have sent several prescriptions at the patient's pharmacy including Compazine 10 mg p.o. every 6 hours as needed for nausea.  I have also sent folic acid to the patient's pharmacy 1 mg p.o. daily.  I have not sent a prescription for Decadron 4 mg tablets to his pharmacy at this time in case he enrolls in the trial arm that includes immunotherapy. If he does not proceed with immunotherapy, we will need to prescribe decadron.   I will arrange for the patient to for vitamin B12 injection in the clinic today.  We will see the patient back for follow-up visit in 1 week for evaluation before starting his first cycle of treatment.   I will arrange for the patient to have the COVID-19 booster vaccine in 3 weeks from today.   The patient was advised to call immediately if he has any concerning symptoms in the interval. The patient voices understanding of current disease status and treatment options and is in agreement with the current care plan. All questions were answered. The patient knows to call the clinic with any problems, questions or concerns. We can certainly see the patient much sooner if necessary  Orders Placed This Encounter  Procedures  . CBC with Differential (Cancer Center Only)    Standing Status:   Standing    Number of Occurrences:   12    Standing Expiration Date:   06/08/2021  . CMP ([facility] only)    Standing Status:   Standing    Number of Occurrences:   12    Standing Expiration Date:   06/08/2021  . Magnesium    Standing Status:   Standing    Number of Occurrences:   12    Standing Expiration Date:   06/08/2021      Tobe Sos , PA-C 06/08/20  ADDENDUM: Hematology/oncology Attending: I had a face-to-face encounter with the patient today.  I recommended his care plan.  This is a very pleasant 69 years old white male recently diagnosed with a stage IIIa (T4, N0, M0) non-small cell lung cancer, mucinous adenocarcinoma diagnosed in August 2021 status post right lower lobectomy with lymph node dissection under the care of Dr. Kipp Brood on 04/28/2020. The patient was enrolled on the alchemist clinical trial for molecular studies.  His PD-L1 expression is negative and he has negative ALK gene translocation but EGFR mutation test is still pending. I had a lengthy discussion with the patient and his wife today about his current disease stage, prognosis and treatment options. I discussed with the patient proceeding with standard systemic chemotherapy with cisplatin 75 mg/M2 and pemetrexed 75 mg/M2 every 3 weeks for a total of 4 cycles unless the molecular studies showed positive EGFR mutation then we will discuss the option of targeted therapy. Once EGFR mutation results become available, we may also discussed with the patient enrollment in the alliance clinical trial were The patient will be randomized to standard systemic chemotherapy with cisplatin and pemetrexed versus combination of systemic chemotherapy with cisplatin and pemetrexed concurrent with immunotherapy followed by maintenance immunotherapy versus systemic chemotherapy with cisplatin and pemetrexed followed by maintenance immunotherapy.  The patient is also interested in this option. We will arrange for the patient to receive vitamin B12 injection today. We will send prescription for folic acid 1 mg p.o. daily in addition to Compazine for nausea to his pharmacy. We will place the care plan  for standard systemic chemotherapy for now to proceed next week unless we receive the molecular studies earlier and we change the plan according to the  patient's interest in the alliance trial. He will come back for follow-up visit next week for evaluation before making the final decision regarding his treatment. He was advised to call immediately if he has any concerning symptoms in the interval.  Disclaimer: This note was dictated with voice recognition software. Similar sounding words can inadvertently be transcribed and may be missed upon review. Eilleen Kempf, MD 06/08/20

## 2020-06-08 ENCOUNTER — Other Ambulatory Visit: Payer: Self-pay | Admitting: Internal Medicine

## 2020-06-08 ENCOUNTER — Inpatient Hospital Stay: Payer: Medicare Other

## 2020-06-08 ENCOUNTER — Encounter: Payer: Self-pay | Admitting: Physician Assistant

## 2020-06-08 ENCOUNTER — Inpatient Hospital Stay (HOSPITAL_BASED_OUTPATIENT_CLINIC_OR_DEPARTMENT_OTHER): Payer: Medicare Other | Admitting: Physician Assistant

## 2020-06-08 ENCOUNTER — Other Ambulatory Visit: Payer: Self-pay

## 2020-06-08 VITALS — BP 117/76 | HR 95 | Temp 98.8°F | Resp 18 | Ht 73.0 in | Wt 210.3 lb

## 2020-06-08 DIAGNOSIS — C3491 Malignant neoplasm of unspecified part of right bronchus or lung: Secondary | ICD-10-CM | POA: Diagnosis not present

## 2020-06-08 DIAGNOSIS — I2510 Atherosclerotic heart disease of native coronary artery without angina pectoris: Secondary | ICD-10-CM

## 2020-06-08 DIAGNOSIS — Z5111 Encounter for antineoplastic chemotherapy: Secondary | ICD-10-CM

## 2020-06-08 DIAGNOSIS — C3431 Malignant neoplasm of lower lobe, right bronchus or lung: Secondary | ICD-10-CM | POA: Diagnosis not present

## 2020-06-08 DIAGNOSIS — Z7189 Other specified counseling: Secondary | ICD-10-CM | POA: Diagnosis not present

## 2020-06-08 DIAGNOSIS — Z006 Encounter for examination for normal comparison and control in clinical research program: Secondary | ICD-10-CM

## 2020-06-08 DIAGNOSIS — C3490 Malignant neoplasm of unspecified part of unspecified bronchus or lung: Secondary | ICD-10-CM

## 2020-06-08 DIAGNOSIS — I2584 Coronary atherosclerosis due to calcified coronary lesion: Secondary | ICD-10-CM

## 2020-06-08 LAB — CBC WITH DIFFERENTIAL (CANCER CENTER ONLY)
Abs Immature Granulocytes: 0.02 10*3/uL (ref 0.00–0.07)
Basophils Absolute: 0.1 10*3/uL (ref 0.0–0.1)
Basophils Relative: 1 %
Eosinophils Absolute: 0.3 10*3/uL (ref 0.0–0.5)
Eosinophils Relative: 4 %
HCT: 43.5 % (ref 39.0–52.0)
Hemoglobin: 14.4 g/dL (ref 13.0–17.0)
Immature Granulocytes: 0 %
Lymphocytes Relative: 20 %
Lymphs Abs: 1.6 10*3/uL (ref 0.7–4.0)
MCH: 29.5 pg (ref 26.0–34.0)
MCHC: 33.1 g/dL (ref 30.0–36.0)
MCV: 89.1 fL (ref 80.0–100.0)
Monocytes Absolute: 0.6 10*3/uL (ref 0.1–1.0)
Monocytes Relative: 7 %
Neutro Abs: 5.5 10*3/uL (ref 1.7–7.7)
Neutrophils Relative %: 68 %
Platelet Count: 220 10*3/uL (ref 150–400)
RBC: 4.88 MIL/uL (ref 4.22–5.81)
RDW: 12.5 % (ref 11.5–15.5)
WBC Count: 8.1 10*3/uL (ref 4.0–10.5)
nRBC: 0 % (ref 0.0–0.2)

## 2020-06-08 LAB — CMP (CANCER CENTER ONLY)
ALT: 27 U/L (ref 0–44)
AST: 19 U/L (ref 15–41)
Albumin: 4 g/dL (ref 3.5–5.0)
Alkaline Phosphatase: 87 U/L (ref 38–126)
Anion gap: 5 (ref 5–15)
BUN: 18 mg/dL (ref 8–23)
CO2: 25 mmol/L (ref 22–32)
Calcium: 9.3 mg/dL (ref 8.9–10.3)
Chloride: 106 mmol/L (ref 98–111)
Creatinine: 0.95 mg/dL (ref 0.61–1.24)
GFR, Est AFR Am: >60 mL/min (ref >60)
GFR, Estimated: >60 mL/min (ref >60)
Glucose, Bld: 142 mg/dL — ABNORMAL HIGH (ref 70–99)
Potassium: 4.7 mmol/L (ref 3.5–5.1)
Sodium: 136 mmol/L (ref 135–145)
Total Bilirubin: 1 mg/dL (ref 0.3–1.2)
Total Protein: 7.1 g/dL (ref 6.5–8.1)

## 2020-06-08 LAB — RESEARCH LABS

## 2020-06-08 MED ORDER — CYANOCOBALAMIN 1000 MCG/ML IJ SOLN
1000.0000 ug | Freq: Once | INTRAMUSCULAR | Status: AC
  Administered 2020-06-08: 1000 ug via INTRAMUSCULAR

## 2020-06-08 MED ORDER — PROCHLORPERAZINE MALEATE 10 MG PO TABS: 10.0000 mg | ORAL_TABLET | Freq: Four times a day (QID) | ORAL | 2 refills | Status: DC | PRN

## 2020-06-08 MED ORDER — CYANOCOBALAMIN 1000 MCG/ML IJ SOLN
INTRAMUSCULAR | Status: AC
  Filled 2020-06-08: qty 1

## 2020-06-08 MED ORDER — FOLIC ACID 1 MG PO TABS: 1.0000 mg | ORAL_TABLET | Freq: Every day | ORAL | 2 refills | Status: DC

## 2020-06-08 NOTE — Progress Notes (Signed)
START ON PATHWAY REGIMEN - Non-Small Cell Lung     A cycle is every 21 days:     Pemetrexed      Cisplatin   **Always confirm dose/schedule in your pharmacy ordering system**  Patient Characteristics: Postoperative without Neoadjuvant Therapy (Pathologic Staging), Stage III, Adjuvant Chemotherapy, Nonsquamous Cell Therapeutic Status: Postoperative without Neoadjuvant Therapy (Pathologic Staging) AJCC T Category: pT4 AJCC N Category: pN0 AJCC M Category: cM0 AJCC 8 Stage Grouping: IIIA Histology: Nonsquamous Cell Intent of Therapy: Curative Intent, Discussed with Patient

## 2020-06-08 NOTE — Patient Instructions (Addendum)
Summary:  -There are two main categories of lung cancer, they are named based on the size of the cancer cell. One is called Non-Small cell lung cancer. 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 This is the most common type of lung cancer.  -We covered a lot of important information at your appointment today regarding what the treatment plan is moving forward. Here are the the main points that were discussed at your office visit with [place] today:  -The treatment that you will receive consists of two chemotherapy drugs, called Cisplatin and Alimta (also called Pemetrexed).  -We are planning on starting your treatment next week on 06/15/20 but before your start your treatment, I would like you to attend a Chemotherapy Education Class. This involves having you sit down with one of our nurse educators. She will discuss with your one-on-one more details about your treatment as well as general information about resources here at the cancer center.  -Your treatment will be given once every 3 weeks. We will check your labs once a week just to make sure that important components of your blood are in an acceptable range -The plan is to do this for 4 rounds.   Medications:  -I have sent a few important medication prescriptions to your pharmacy.  -Compazine was sent to your pharmacy. This medication is for nausea. You may take this every 6 hours as needed if you feel nauseous.  -I have also sent a prescription for 1 mg of folic acid to your pharmacy. We need you to take 1 tablet every day.  -We will administer vitamin B12 every 9 weeks while you are here in the clinic. You have received your first dose today.  -I will send a prescription for decadron to your pharmacy to take 1 tablet TWICE a day the day before, the day of, the day after chemotherapy. You will only need this if you are NOT  getting immunotherapy.    Follow up:  -We will see you back for a follow up visit 1 week after your first treatment to see how it went and help manage any side effects of treatment that you may have   -If you need to reach [place] at any time, the main office number to the cancer center is [phone], when you call, ask to speak to either Cassie's or Dr. Worthy Flank nurse.

## 2020-06-09 ENCOUNTER — Encounter: Payer: Self-pay | Admitting: Radiology

## 2020-06-09 DIAGNOSIS — C3491 Malignant neoplasm of unspecified part of right bronchus or lung: Secondary | ICD-10-CM

## 2020-06-09 NOTE — Research (Signed)
**  LATE ENTRY**  DCP-001 USE OF A CLINICAL TRIAL SCREENING TOOL TO ADDRESS CANCER HEALTH DISPARITIES IN THE [employer] COMMUNITY ONCOLOGY RESEARCH PROGRAM (NCORP)  06/08/20     2:35PM  DCP-001CONSENT:Met with [name] [name] and his wife after their visit with Dr. Julien Nordmann in a private exam room for about 15 minutes to discuss DCP. WereviewedtheDCP-001 consent form (protocol version date 03/04/19, [place] Active Date 09/16/19) in full, including the voluntary nature of participation, the project purpose, risks and benefits, and who will see the provided information. [name] [name] verbalized understanding that this study is a one-time consent for collection of demographic variables and that no patient identifiers are being reported. We also reviewed the [place] form (dated 11/02/14) including the data to be collected, why the information is being collected, who will see the information, and safety measures to keep information private.Upon completion of review,[name] [name] verbalized a desire to voluntarily participate in DCP-001. All consent and authorization forms were signed and dated by the patient andmyself as the person obtaining consent andsigned copiesof both documentswere providedtopatient.  DCP-001 WORKSHEET:After informed consent was obtained, the DCP-001 Worksheet wasgiven to patientto collect ethnicity, language, literacy, education, employment, income and smoking history not available in the electronic medical record. [name] [name] and his wife were thanked for their time.  [place] RT(R)(T) Scientist, physiological

## 2020-06-10 ENCOUNTER — Telehealth: Payer: Self-pay

## 2020-06-10 ENCOUNTER — Other Ambulatory Visit: Payer: Self-pay

## 2020-06-10 ENCOUNTER — Inpatient Hospital Stay: Payer: Medicare Other

## 2020-06-10 NOTE — Telephone Encounter (Signed)
Patient requested a note for restrictions for work and return to work note.  Note given and sent through mychart per patient's request.

## 2020-06-11 ENCOUNTER — Encounter: Payer: Self-pay | Admitting: Internal Medicine

## 2020-06-11 ENCOUNTER — Encounter: Payer: Self-pay | Admitting: Radiology

## 2020-06-11 ENCOUNTER — Ambulatory Visit (INDEPENDENT_AMBULATORY_CARE_PROVIDER_SITE_OTHER): Payer: Medicare Other | Admitting: Internal Medicine

## 2020-06-11 VITALS — BP 120/80 | HR 66 | Ht 73.0 in | Wt 213.0 lb

## 2020-06-11 DIAGNOSIS — I2584 Coronary atherosclerosis due to calcified coronary lesion: Secondary | ICD-10-CM

## 2020-06-11 DIAGNOSIS — T466X5A Adverse effect of antihyperlipidemic and antiarteriosclerotic drugs, initial encounter: Secondary | ICD-10-CM | POA: Diagnosis not present

## 2020-06-11 DIAGNOSIS — M791 Myalgia, unspecified site: Secondary | ICD-10-CM

## 2020-06-11 DIAGNOSIS — E785 Hyperlipidemia, unspecified: Secondary | ICD-10-CM | POA: Diagnosis not present

## 2020-06-11 DIAGNOSIS — I2510 Atherosclerotic heart disease of native coronary artery without angina pectoris: Secondary | ICD-10-CM

## 2020-06-11 DIAGNOSIS — C3491 Malignant neoplasm of unspecified part of right bronchus or lung: Secondary | ICD-10-CM

## 2020-06-11 NOTE — Progress Notes (Signed)
LIPID CLINIC CONSULT NOTE  Chief Complaint:  Manage dyslipidemia, statin intolerance  Primary Care Physician: Gaynelle Arabian, MD  Primary Cardiologist:  No primary care provider on file.  HPI:  [name] [name] is a 69 y.o. male who is being seen today for the evaluation of dyslipidemia, statin intolerance at the request of Gaynelle Arabian, MD.  This is a pleasant 69 year old male kindly referred for management of dyslipidemia with a history of statin intolerance (this included atorvastatin, ezetimibe, pravastatin, fenofibrate and pitavastatin).  He reports longstanding dyslipidemia and has been tried on both multiple statins, ezetimibe and fenofibrate in the past causing myalgias which were intolerable.  More recently had lipids off of medication and total cholesterol was 267, triglycerides 166, HDL 53 and LDL 183.  Non-HDL cholesterol 214.  There is a strong family history of heart disease. He related that all 5 siblings (3 girls, 2 boys) take cholesterol medicine and both brothers had heart attacks. Father's cholesterol was very high, difficult to keep below 300 (per patient report).   06/11/2020  [name] [name] returns today for follow-up. Unfortunately he is CT calcium score was abnormal showing an elevated score of 1222, 93rd percentile for age and sex matched control. He denies any cardiac symptoms however was found on extracardiac imaging to have a macrolobulated mass in the right lower lobe concerning for primary bronchogenic neoplasm. Subsequently he underwent work-up and this was found to be hypermetabolic. 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 Fortunately no evidence of metastases and he is planning to undergo neoadjuvant therapy. He was able to get through the procedure well without any issues therefore I do not think that he has any significant obstructive coronary disease. He has responded well to Praluent with a marked  reduction in cholesterol. Total is now 143, triglycerides 103, HDL 37 and LDL of 87. For the time being we will continue this. He did get a grant from [employer] and I informed him that it may be that that is no longer active as of next summer. That foundation is not currently providing grants. We will have to seek out other all options possibly next year.  PMHx:  Past Medical History:  Diagnosis Date  . Arthritis   . Cancer (HCC)    RIGHT LUNG  . Carpal tunnel syndrome    Right  . Cataracts, bilateral    very mild  . Early cataracts, bilateral   . Fatty liver   . Gallstones   . GERD (gastroesophageal reflux disease)   . Headache 2011   HX OF VISUAL MIGRAINES - NO PAIN --MIGRAINES ATTRIBUTED TO PT CUTTING NICOTINE PATCH IN HALF BEFORE APPLYING TO HIS SKIN.    Marland Kitchen Hepatitis 2001   C -treated and no problems since  2001 'cured'  . History of hiatal hernia   . History of kidney stones    found on CT Scan  . HOH (hard of hearing)   . Muscle cramps    hands and legs  . Osteopenia   . Pain    KNEE OA  . Right lower lobe lung mass   . Shortness of breath dyspnea    "SOMETIMES FEELS LIKE I CAN'T CATCH A DEEP BREATH "- ON GOING FOR YEARS - [place] WORK UP 2011 - [place]  . Stroke ([place])    HX POSSIBLE TIA - RT LEG NUMBNESS AND TINGLING FINGERS AND  NUMBNESS RT FACE - ALL RESOLVED ; STATES NEUROLOGIST COULD NOT FIND ANY EVIDENCE OF STROKE - THOUGHT PT'S EPISODE MIGHT BE FROM CUTTING NICOTINE PATCH IN HALF AND APPLYING TO SKIN  . Trigger finger    LEFT MIDDLE FINGER  . Wears glasses     Past Surgical History:  Procedure Laterality Date  . BRONCHIAL BIOPSY  02/24/2020   Procedure: BRONCHIAL BIOPSIES;  Surgeon: Collene Gobble, MD;  Location[facility] ENDOSCOPY;  Service: Pulmonary;;  . BRONCHIAL BRUSHINGS  02/24/2020   Procedure: BRONCHIAL BRUSHINGS;  Surgeon: Collene Gobble, MD;  Location: [facility] ENDOSCOPY;  Service: Pulmonary;;  . BRONCHIAL NEEDLE  ASPIRATION BIOPSY  02/24/2020   Procedure: BRONCHIAL NEEDLE ASPIRATION BIOPSIES;  Surgeon: Collene Gobble, MD;  Location: [facility] ENDOSCOPY;  Service: Pulmonary;;  . BRONCHIAL WASHINGS  02/24/2020   Procedure: BRONCHIAL WASHINGS;  Surgeon: Collene Gobble, MD;  Location: [facility] ENDOSCOPY;  Service: Pulmonary;;  . COLONOSCOPY    . INTERCOSTAL NERVE BLOCK Right 04/28/2020   Procedure: INTERCOSTAL NERVE BLOCK;  Surgeon: Lajuana Matte, MD;  Location: [place];  Service: Thoracic;  Laterality: Right;  . left ankle surgery     fractured  . left knee surgery     MENISCAL REMOVED  . left shoulder     torn ligaments left shoulder  . LUMBAR FUSION     L3 L4 L5  . NODE DISSECTION Right 04/28/2020   Procedure: NODE DISSECTION;  Surgeon: Lajuana Matte, MD;  Location: [place];  Service: Thoracic;  Laterality: Right;  . RIGHT CARPAL TUNNEL RELEASE  2011  . RIGHT KNEE ARTHROSCOPY   AUG OR SEPT 2015   AT [facility]  . ROOT CANAL    . THORACOSCOPY Right 04/28/2020   XI ROBOTIC ASSISTED THORASCOPY-LOBECTOMY (Right)  . TONSILLECTOMY     as child  . 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 TOTAL KNEE ARTHROPLASTY Right 07/21/2014   Procedure: RIGHT TOTAL KNEE ARTHROPLASTY;  Surgeon: Mauri Pole, MD;  Location: [facility] ORS;  Service: Orthopedics;  Laterality: Right;  . TOTAL SHOULDER ARTHROPLASTY Right 10/17/2019   Procedure: RIGHT REVERSE TOTAL SHOULDER REPLACEMENT;  Surgeon: Netta Cedars, MD;  Location: [facility] ORS;  Service: Orthopedics;  Laterality: Right;  interscalene block  . TRIGGER FINGER RELEASE Left   . VIDEO BRONCHOSCOPY WITH ENDOBRONCHIAL NAVIGATION Right 02/24/2020   Procedure: VIDEO BRONCHOSCOPY WITH ENDOBRONCHIAL NAVIGATION;  Surgeon: Collene Gobble, MD;  Location: [facility] ENDOSCOPY;  Service: Pulmonary;  Laterality: Right;  . WEDGE RESECTION Right  04/28/2020   XI ROBOTIC ASSISTED THORASCOPY-RIGHT LOWER LOBE WEDGE RESECTION (Right Chest)     FAMHx:  Family History  Problem Relation Age of Onset  . Alcohol abuse Father   . Hyperlipidemia Father   . Heart attack Brother 53  . Hyperlipidemia Brother   . Hyperlipidemia Sister        3 sisters  . Heart attack Brother   . Hyperlipidemia Brother     SOCHx:   reports that he quit smoking about 8 years ago. He has never used smokeless tobacco. He reports previous alcohol use of about 7.0 standard drinks of alcohol per week. He reports previous drug use. Drug: Marijuana.  ALLERGIES:  Allergies  Allergen Reactions  . Statins Other (See Comments)    MYALGIAS    ROS: Pertinent items noted in HPI and remainder of comprehensive ROS  otherwise negative.  HOME MEDS: Current Outpatient Medications on File Prior to Visit  Medication Sig Dispense Refill  . acetaminophen (TYLENOL) 500 MG tablet Take 500 mg by mouth every 6 (six) hours as needed.    . Alirocumab (PRALUENT) 150 MG/ML SOAJ Inject 1 Dose into the skin every 14 (fourteen) days. (Patient taking differently: Inject 1 Dose into the skin every 14 (fourteen) days. ) 2 pen 11  . clindamycin (CLEOCIN) 150 MG capsule Take 150-300 mg by mouth See admin instructions. 300 mg 1 hour before Dental appt, 150 mg 6 hours after appt    . cyclobenzaprine (FLEXERIL) 10 MG tablet Take 10 mg by mouth at bedtime.     . diphenhydrAMINE (BENADRYL) 50 MG tablet Take 50 mg by mouth at bedtime as needed for itching.    . folic acid (FOLVITE) 1 MG tablet Take 1 tablet (1 mg total) by mouth daily. 30 tablet 2  . HYDROcodone-acetaminophen (NORCO/VICODIN) 5-325 MG tablet Take 1 tablet by mouth every 6 (six) hours as needed for moderate pain. 30 tablet 0  . ibuprofen (ADVIL) 200 MG tablet Take 3 tablets (600 mg total) by mouth every 6 (six) hours as needed for headache or mild pain (for pain.). 30 tablet 0  . loratadine (CLARITIN) 10 MG tablet Take 10 mg by  mouth daily. AllerClear    . LORazepam (ATIVAN) 0.5 MG tablet 1 tablet 30 minutes before the MRI.  Repeat once if needed. 2 tablet 0  . Magnesium 500 MG TABS Take 500 mg by mouth in the morning, at noon, and at bedtime.     . prochlorperazine (COMPAZINE) 10 MG tablet Take 1 tablet (10 mg total) by mouth every 6 (six) hours as needed. 30 tablet 2   No current facility-administered medications on file prior to visit.    LABS/IMAGING: No results found for this or any previous visit (from the past 48 hour(s)). No results found.  LIPID PANEL:    Component Value Date/Time   CHOL 143 06/04/2020 0945   TRIG 103 06/04/2020 0945   HDL 37 (L) 06/04/2020 0945   CHOLHDL 3.9 06/04/2020 0945   CHOLHDL 5.6 10/31/2007 0830   VLDL 26 10/31/2007 0830   LDLCALC 87 06/04/2020 0945    WEIGHTS: Wt Readings from Last 3 Encounters:  06/11/20 213 lb (96.6 kg)  06/08/20 210 lb 4.8 oz (95.4 kg)  05/21/20 209 lb (94.8 kg)    VITALS: BP 120/80   Pulse 66   Ht 6\' 1"  (1.854 m)   Wt 213 lb (96.6 kg)   SpO2 97%   BMI 28.10 kg/m   EXAM: Deferred  EKG: Deferred  ASSESSMENT: 1. Mixed dyslipidemia with high LDL cholesterol 2. 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 Strong family history of high cholesterol 4. Statin intolerance, Zetia and fenofibrate intolerance-myalgias 5. Abnormal CAC score of 1222, 93rd percentile for age and sex matched control (01/2020) 6. Lung cancer status post wedge resection  PLAN: 1.   Mr. Bevill seems to be recovering well from surgery to remove a lung cancer and is planning to undergo some adjuvant chemotherapy. He did have a high calcium score but no obstructive coronary symptoms. He has had marked reduction in his lipids on a PCSK9 inhibitor I would continue that and monitor for development of any symptoms. Plan follow-up with me in 12 months or sooner as necessary.Pixie Casino, MD, [facility], [place] Director of  the Advanced Lipid Disorders &  Cardiovascular Risk Reduction Clinic Diplomate of the American Board of Clinical Lipidology Attending Cardiologist  Direct Dial: [phone]  Fax: [phone]  Website:  [email or web address] [email or web address] [email or web address] 06/11/2020, 10:47 AM

## 2020-06-11 NOTE — Patient Instructions (Signed)
Medication Instructions:  Your physician recommends that you continue on your current medications as directed. Please refer to the Current Medication list given to you today.  *If you need a refill on your cardiac medications before your next appointment, please call your pharmacy*   Lab Work: FASTING lipid panel in 1 year   If you have labs (blood work) drawn today and your tests are completely normal, you will receive your results only by: Marland Kitchen MyChart Message (if you have MyChart) OR . A paper copy in the mail If you have any lab test that is abnormal or we need to change your treatment, we will call you to review the results.   Testing/Procedures: NONE   Follow-Up: At [facility], you and your health needs are our priority.  As part of our continuing mission to provide you with exceptional heart care, we have created designated Provider Care Teams.  These Care Teams include your primary Cardiologist (physician) and Advanced Practice Providers (APPs -  Physician Assistants and Nurse Practitioners) who all work together to provide you with the care you need, when you need it.  We recommend signing up for the patient portal called "MyChart".  Sign up information is provided on this After Visit Summary.  MyChart is used to connect with patients for Virtual Visits (Telemedicine).  Patients are able to view lab/test results, encounter notes, upcoming appointments, etc.  Non-urgent messages can be sent to your provider as well.   To learn more about what you can do with MyChart, go to [email or web address].    Your next appointment:   12 month(s) - lipid clinic  The format for your next appointment:   In Person  Provider:   K. Mali Hilty, MD   Other Instructions

## 2020-06-11 NOTE — Research (Signed)
[id number]: ALCHEMIST CHEMO-IO (ACCIO)  06/11/2020     10:30AM  PHONE CALL: Confirmed I was speaking with [name]. Informed Mr. Hyslop that EGFR results have returned and he is eligible for [facility] based on all results from the ALCHEMIST study. He expressed interest in participating, so I e-mailed him the consent form and HIPPA authorization form for his review. The plan is to call him on Monday to confirm his interest, answer any questions he may have, and schedule an appointment for a consent visit as well as any labs that he may need. I spoke with Dr. Julien Nordmann concerning the results and the overall plan. Rubin Payor and myself have reviewed eligibility for the [facility] study and patient appears to be eligible. Thanked patient for his time and I look forward to speaking with him Monday.   Carol Ada, RT(R)(T) Clinical Research Coordinator

## 2020-06-12 ENCOUNTER — Encounter: Payer: Self-pay | Admitting: Internal Medicine

## 2020-06-14 ENCOUNTER — Inpatient Hospital Stay: Payer: Medicare Other | Attending: Internal Medicine | Admitting: Radiology

## 2020-06-14 ENCOUNTER — Inpatient Hospital Stay: Payer: Medicare Other

## 2020-06-14 ENCOUNTER — Other Ambulatory Visit: Payer: Self-pay | Admitting: *Deleted

## 2020-06-14 ENCOUNTER — Encounter: Payer: Self-pay | Admitting: *Deleted

## 2020-06-14 ENCOUNTER — Other Ambulatory Visit: Payer: Self-pay

## 2020-06-14 DIAGNOSIS — C3431 Malignant neoplasm of lower lobe, right bronchus or lung: Secondary | ICD-10-CM | POA: Insufficient documentation

## 2020-06-14 DIAGNOSIS — C3491 Malignant neoplasm of unspecified part of right bronchus or lung: Secondary | ICD-10-CM

## 2020-06-14 DIAGNOSIS — Z5111 Encounter for antineoplastic chemotherapy: Secondary | ICD-10-CM | POA: Insufficient documentation

## 2020-06-14 DIAGNOSIS — Z79899 Other long term (current) drug therapy: Secondary | ICD-10-CM

## 2020-06-14 DIAGNOSIS — Z23 Encounter for immunization: Secondary | ICD-10-CM

## 2020-06-14 LAB — CMP (CANCER CENTER ONLY)
ALT: 28 U/L (ref 0–44)
AST: 17 U/L (ref 15–41)
Albumin: 4.1 g/dL (ref 3.5–5.0)
Alkaline Phosphatase: 91 U/L (ref 38–126)
Anion gap: 5 (ref 5–15)
BUN: 19 mg/dL (ref 8–23)
CO2: 30 mmol/L (ref 22–32)
Calcium: 9.9 mg/dL (ref 8.9–10.3)
Chloride: 105 mmol/L (ref 98–111)
Creatinine: 0.85 mg/dL (ref 0.61–1.24)
GFR, Est AFR Am: >60 mL/min (ref >60)
GFR, Estimated: >60 mL/min (ref >60)
Glucose, Bld: 82 mg/dL (ref 70–99)
Potassium: 4.4 mmol/L (ref 3.5–5.1)
Sodium: 140 mmol/L (ref 135–145)
Total Bilirubin: 0.7 mg/dL (ref 0.3–1.2)
Total Protein: 7.4 g/dL (ref 6.5–8.1)

## 2020-06-14 LAB — CBC WITH DIFFERENTIAL (CANCER CENTER ONLY)
Abs Immature Granulocytes: 0.01 10*3/uL (ref 0.00–0.07)
Basophils Absolute: 0 10*3/uL (ref 0.0–0.1)
Basophils Relative: 1 %
Eosinophils Absolute: 0.2 10*3/uL (ref 0.0–0.5)
Eosinophils Relative: 3 %
HCT: 44.8 % (ref 39.0–52.0)
Hemoglobin: 14.7 g/dL (ref 13.0–17.0)
Immature Granulocytes: 0 %
Lymphocytes Relative: 18 %
Lymphs Abs: 1.5 10*3/uL (ref 0.7–4.0)
MCH: 30.1 pg (ref 26.0–34.0)
MCHC: 32.8 g/dL (ref 30.0–36.0)
MCV: 91.6 fL (ref 80.0–100.0)
Monocytes Absolute: 0.8 10*3/uL (ref 0.1–1.0)
Monocytes Relative: 9 %
Neutro Abs: 5.9 10*3/uL (ref 1.7–7.7)
Neutrophils Relative %: 69 %
Platelet Count: 220 10*3/uL (ref 150–400)
RBC: 4.89 MIL/uL (ref 4.22–5.81)
RDW: 12.7 % (ref 11.5–15.5)
WBC Count: 8.4 10*3/uL (ref 4.0–10.5)
nRBC: 0 % (ref 0.0–0.2)

## 2020-06-14 LAB — MAGNESIUM: Magnesium: 2.3 mg/dL (ref 1.7–2.4)

## 2020-06-14 NOTE — Research (Signed)
[id number] INTEGRATION OF IMMUOTHERAPY INTO ADJUVANT THERAPY FOR RESECTED NSCLC:ALCHEMIST CHEMO-IO (ACCIO);  Met with patient alone in private conference room for 45 minutes to review and consent for the above study.  Patient had a copy of the consent form that was sent to him previously. He says he read it at home but doesn't remember everything.  Provided patient with another copy of the consent form PVD 03/12/2020, [place] active date 03/30/2020 and reviewed page by page with patient.  Informed patient that participation is completely voluntary and he can withdraw from the study at any time. The purpose of the study along with the study design, including the randomization process and the 3 different arms of the study were explained.  Possible risks were reviewed with particular attention to the possible side effects of pembrolizumab .Informed patient he will not be paid for participating in this study and all study assessments and visits are billed to his insurance. The study will cover the cost of the pembrolizumab if given, but not any associated visits or tests. Reviewed section regarding optional QOL questionnaires and providing samples for future research.  [place] form was also reviewed in detail and explained how patient's privacy is protected. Patient denied any questions and agreed to enroll on this study.  He signed and dated the consent form and hippa forms. He agreed to the optional studies and to being contacted regarding any studies in the future.  Copies of both forms were made and given to patient for his records.  My business card was given to patient to call if any questions before his next visit. Thanked patient for his time and willingness to participate in this study.  We discussed next step for the study is to complete his screening for eligibility and this requires patient to have blood work to check his HCV RNA.  Patient reported history of Hepatitis C which was treated and "cleared."  The  study requires blood work to show the virus has cleared to be eligible.  We also need TSH done for study before treatment. Patient would like to start his treatment as soon as possible.  He agreed to having lab appointment added today to obtain blood for the HCV RNA and TSH. He was also scheduled for his Covid19 booster shot next week but we can arrange to have that done today.  Lab appointment was added along with injection appointment.  Patient escorted to lab by research nurse.  Research nurse then went to work on entering the TSH and HCV orders to be drawn. Meanwhile patient was called to lab by phlebotomist and had his blood drawn using standing orders of CBC and CMET but not TSH and HCV.  Patient had already left the clinic when research nurse was aware of this. Research manager, Doreatha Martin, will contact patient and arrange for him to return for the research required blood draw if he is willing.  Foye Spurling, BSN, RN Clinical Research Nurse 06/14/2020 4:08 PM

## 2020-06-14 NOTE — Research (Signed)
[id number] ACCIO INTEGRATION OF IMMUNOTHERAPY INTO ADJUVANT THERAPY FOR RESECTED NSCLC  06/14/2020 1:00 PM  Met with patient in waiting area and brought him down to a private room in the research department. I introduced Foye Spurling, RN as the clinical research nurse who would be consenting him for the [facility] study.   Carol Ada, RT(R)(T) Clinical Research Coordinator

## 2020-06-15 ENCOUNTER — Other Ambulatory Visit: Payer: Self-pay | Admitting: Physician Assistant

## 2020-06-15 ENCOUNTER — Other Ambulatory Visit: Payer: Medicare Other

## 2020-06-15 ENCOUNTER — Telehealth: Payer: Self-pay

## 2020-06-15 ENCOUNTER — Telehealth: Payer: Self-pay | Admitting: *Deleted

## 2020-06-15 ENCOUNTER — Telehealth: Payer: Self-pay | Admitting: Internal Medicine

## 2020-06-15 DIAGNOSIS — C3491 Malignant neoplasm of unspecified part of right bronchus or lung: Secondary | ICD-10-CM

## 2020-06-15 MED ORDER — DEXAMETHASONE 4 MG PO TABS: ORAL_TABLET | ORAL | 2 refills | Status: DC

## 2020-06-15 NOTE — Telephone Encounter (Signed)
Per 10/5 scheduling messages rescheduled labs and called and spoke to patient to advise of undated schedule.

## 2020-06-15 NOTE — Telephone Encounter (Signed)
Patient called office stating he has decided not to enroll in the research study and will need a prescription of steroids sent to his pharmacy in preparation for his first chemo on 06/17/20. Cassandra Heilingoetter, PA made aware. Prescription sent to patient's pharmacy on file. Patient aware that he needs to take first dose of decadron on 06/16/20.

## 2020-06-15 NOTE — Telephone Encounter (Signed)
[id number] Study;  Patient called to inform research nurse he has decided to not enroll on this study.  He prefers to start treatment this week as scheduled and not delay due to waiting on study assessments.  Thanked patient for his time and interest in discussing this study.  Informed patient he will continue to be followed on the main Alchemist study every 6 months and his contribution to research is appreciated. Reviewed patients schedule and he confirmed starting chemotherapy this Thursday at 730 am. Lab appt and research lab orders for today canceled.  Informed patient I will request his weekly labs on the next 2 Mondays be changed to Thursdays.  He verbalized understanding.   Dr. Julien Nordmann notified patient declined the study.  Foye Spurling, BSN, RN Clinical Research Nurse 06/15/2020 9:10 AM

## 2020-06-16 NOTE — Progress Notes (Signed)
Pharmacist Chemotherapy Monitoring - Initial Assessment    Anticipated start date: 06/17/20   Regimen:  . Are orders appropriate based on the patient's diagnosis, regimen, and cycle? Yes . Does the plan date match the patient's scheduled date? Yes . Is the sequencing of drugs appropriate? Yes . Are the premedications appropriate for the patient's regimen? Yes . Prior Authorization for treatment is: Approved o If applicable, is the correct biosimilar selected based on the patient's insurance? not applicable  Organ Function and Labs: Marland Kitchen Are dose adjustments needed based on the patient's renal function, hepatic function, or hematologic function? No . Are appropriate labs ordered prior to the start of patient's treatment? Yes . Other organ system assessment, if indicated: N/A . The following baseline labs, if indicated, have been ordered: cisplatin: K, Mg  Dose Assessment: . Are the drug doses appropriate? Yes . Are the following correct: o Drug concentrations Yes o IV fluid compatible with drug Yes o Administration routes Yes o Timing of therapy Yes . If applicable, does the patient have documented access for treatment and/or plans for port-a-cath placement? no . If applicable, have lifetime cumulative doses been properly documented and assessed? yes Lifetime Dose Tracking  No doses have been documented on this patient for the following tracked chemicals: Doxorubicin, Epirubicin, Idarubicin, Daunorubicin, Mitoxantrone, Bleomycin, Oxaliplatin, Carboplatin, Liposomal Doxorubicin  o   Toxicity Monitoring/Prevention: . The patient has the following take home antiemetics prescribed: Prochlorperazine and Dexamethasone . The patient has the following take home medications prescribed: B12 for pemetrexed . Medication allergies and previous infusion related reactions, if applicable, have been reviewed and addressed. Yes . The patient's current medication list has been assessed for drug-drug  interactions with their chemotherapy regimen. no significant drug-drug interactions were identified on review.  Order Review: . Are the treatment plan orders signed? Yes . Is the patient scheduled to see a provider prior to their treatment? No  I verify that I have reviewed each item in the above checklist and answered each question accordingly.   Kennith Center, Pharm.D., CPP 06/16/2020@1 :40 PM

## 2020-06-17 ENCOUNTER — Inpatient Hospital Stay: Payer: Medicare Other

## 2020-06-17 ENCOUNTER — Other Ambulatory Visit: Payer: Self-pay

## 2020-06-17 VITALS — BP 122/70 | HR 86 | Temp 98.4°F | Resp 18

## 2020-06-17 DIAGNOSIS — C3491 Malignant neoplasm of unspecified part of right bronchus or lung: Secondary | ICD-10-CM

## 2020-06-17 DIAGNOSIS — Z5111 Encounter for antineoplastic chemotherapy: Secondary | ICD-10-CM | POA: Diagnosis not present

## 2020-06-17 MED ORDER — SODIUM CHLORIDE 0.9 % IV SOLN
Freq: Once | INTRAVENOUS | Status: AC
  Filled 2020-06-17: qty 250

## 2020-06-17 MED ORDER — SODIUM CHLORIDE 0.9 % IV SOLN
75.0000 mg/m2 | Freq: Once | INTRAVENOUS | Status: AC
  Administered 2020-06-17: 167 mg via INTRAVENOUS
  Filled 2020-06-17: qty 167

## 2020-06-17 MED ORDER — SODIUM CHLORIDE 0.9 % IV SOLN
500.0000 mg/m2 | Freq: Once | INTRAVENOUS | Status: AC
  Administered 2020-06-17: 1100 mg via INTRAVENOUS
  Filled 2020-06-17: qty 40

## 2020-06-17 MED ORDER — PALONOSETRON HCL INJECTION 0.25 MG/5ML
INTRAVENOUS | Status: AC
  Filled 2020-06-17: qty 5

## 2020-06-17 MED ORDER — SODIUM CHLORIDE 0.9 % IV SOLN
Freq: Once | INTRAVENOUS | Status: AC
  Filled 2020-06-17: qty 10

## 2020-06-17 MED ORDER — SODIUM CHLORIDE 0.9 % IV SOLN
150.0000 mg | Freq: Once | INTRAVENOUS | Status: AC
  Administered 2020-06-17: 150 mg via INTRAVENOUS
  Filled 2020-06-17: qty 150

## 2020-06-17 MED ORDER — PALONOSETRON HCL INJECTION 0.25 MG/5ML
0.2500 mg | Freq: Once | INTRAVENOUS | Status: AC
  Administered 2020-06-17: 0.25 mg via INTRAVENOUS

## 2020-06-17 MED ORDER — SODIUM CHLORIDE 0.9 % IV SOLN
10.0000 mg | Freq: Once | INTRAVENOUS | Status: AC
  Administered 2020-06-17: 10 mg via INTRAVENOUS
  Filled 2020-06-17: qty 10

## 2020-06-17 NOTE — Patient Instructions (Signed)
[facility] Discharge Instructions for Patients Receiving Chemotherapy  Today you received the following chemotherapy agents: pemetrexed and cisplatin.  To help prevent nausea and vomiting after your treatment, we encourage you to take your nausea medication as prescribed.   If you develop nausea and vomiting that is not controlled by your nausea medication, call the clinic.   BELOW ARE SYMPTOMS THAT SHOULD BE REPORTED IMMEDIATELY:  *FEVER GREATER THAN 100.5 F  *CHILLS WITH OR WITHOUT FEVER  NAUSEA AND VOMITING THAT IS NOT CONTROLLED WITH YOUR NAUSEA MEDICATION  *UNUSUAL SHORTNESS OF BREATH  *UNUSUAL BRUISING OR BLEEDING  TENDERNESS IN MOUTH AND THROAT WITH OR WITHOUT PRESENCE OF ULCERS  *URINARY PROBLEMS  *BOWEL PROBLEMS  UNUSUAL RASH Items with * indicate a potential emergency and should be followed up as soon as possible.  Feel free to call the clinic should you have any questions or concerns. The clinic phone number is [phone].  Please show the [facility] at check-in to the Emergency Department and triage nurse.  Pemetrexed injection What is this medicine? PEMETREXED (PEM e TREX ed) is a chemotherapy drug used to treat lung cancers like non-small cell lung cancer and mesothelioma. It may also be used to treat other cancers. This medicine may be used for other purposes; ask your health care provider or pharmacist if you have questions. COMMON BRAND NAME(S): Alimta What should I tell my health care provider before I take this medicine? They need to know if you have any of these conditions:  infection (especially a virus infection such as chickenpox, cold sores, or herpes)  kidney disease  low blood counts, like low white cell, platelet, or red cell counts  lung or breathing disease, like asthma  radiation therapy  an unusual or allergic reaction to pemetrexed, other medicines, foods, dyes, or preservative  pregnant or trying to get  pregnant  breast-feeding How should I use this medicine? This drug is given as an infusion into a vein. It is administered in a hospital or clinic by a specially trained health care professional. Talk to your pediatrician regarding the use of this medicine in children. Special care may be needed. Overdosage: If you think you have taken too much of this medicine contact a poison control center or emergency room at once. NOTE: This medicine is only for you. Do not share this medicine with others. What if I miss a dose? It is important not to miss your dose. Call your doctor or health care professional if you are unable to keep an appointment. What may interact with this medicine? This medicine may interact with the following medications:  Ibuprofen This list may not describe all possible interactions. Give your health care provider a list of all the medicines, herbs, non-prescription drugs, or dietary supplements you use. Also tell them if you smoke, drink alcohol, or use illegal drugs. Some items may interact with your medicine. What should I watch for while using this medicine? Visit your doctor for checks on your progress. This drug may make you feel generally unwell. This is not uncommon, as chemotherapy can affect healthy cells as well as cancer cells. Report any side effects. Continue your course of treatment even though you feel ill unless your doctor tells you to stop. In some cases, you may be given additional medicines to help with side effects. Follow all directions for their use. Call your doctor or health care professional for advice if you get a fever, chills or sore throat, or  other symptoms of a cold or flu. Do not treat yourself. This drug decreases your body's ability to fight infections. Try to avoid being around people who are sick. This medicine may increase your risk to bruise or bleed. Call your doctor or health care professional if you notice any unusual bleeding. Be careful  brushing and flossing your teeth or using a toothpick because you may get an infection or bleed more easily. If you have any dental work done, tell your dentist you are receiving this medicine. Avoid taking products that contain aspirin, acetaminophen, ibuprofen, naproxen, or ketoprofen unless instructed by your doctor. These medicines may hide a fever. Call your doctor or health care professional if you get diarrhea or mouth sores. Do not treat yourself. To protect your kidneys, drink water or other fluids as directed while you are taking this medicine. Do not become pregnant while taking this medicine or for 6 months after stopping it. Women should inform their doctor if they wish to become pregnant or think they might be pregnant. Men should not father a child while taking this medicine and for 3 months after stopping it. This may interfere with the ability to father a child. You should talk to your doctor or health care professional if you are concerned about your fertility. There is a potential for serious side effects to an unborn child. Talk to your health care professional or pharmacist for more information. Do not breast-feed an infant while taking this medicine or for 1 week after stopping it. What side effects may I notice from receiving this medicine? Side effects that you should report to your doctor or health care professional as soon as possible:  allergic reactions like skin rash, itching or hives, swelling of the face, lips, or tongue  breathing problems  redness, blistering, peeling or loosening of the skin, including inside the mouth  signs and symptoms of bleeding such as bloody or black, tarry stools; red or dark-brown urine; spitting up blood or brown material that looks like coffee grounds; red spots on the skin; unusual bruising or bleeding from the eye, gums, or nose  signs and symptoms of infection like fever or chills; cough; sore throat; pain or trouble passing  urine  signs and symptoms of kidney injury like trouble passing urine or change in the amount of urine  signs and symptoms of liver injury like dark yellow or brown urine; general ill feeling or flu-like symptoms; light-colored stools; loss of appetite; nausea; right upper belly pain; unusually weak or tired; yellowing of the eyes or skin Side effects that usually do not require medical attention (report to your doctor or health care professional if they continue or are bothersome):  constipation  mouth sores  nausea, vomiting  unusually weak or tired This list may not describe all possible side effects. Call your doctor for medical advice about side effects. You may report side effects to FDA at 1-800-FDA-1088. Where should I keep my medicine? This drug is given in a hospital or clinic and will not be stored at home. NOTE: This sheet is a summary. It may not cover all possible information. If you have questions about this medicine, talk to your doctor, pharmacist, or health care provider.  2020 Elsevier/Gold Standard (2017-10-17 16:11:33)  Cisplatin injection What is this medicine? CISPLATIN (SIS pla tin) is a chemotherapy drug. It targets fast dividing cells, like cancer cells, and causes these cells to die. This medicine is used to treat many types of cancer like bladder,  ovarian, and testicular cancers. This medicine may be used for other purposes; ask your health care provider or pharmacist if you have questions. COMMON BRAND NAME(S): Platinol, Platinol -AQ What should I tell my health care provider before I take this medicine? They need to know if you have any of these conditions:  eye disease, vision problems  hearing problems  kidney disease  low blood counts, like white cells, platelets, or red blood cells  tingling of the fingers or toes, or other nerve disorder  an unusual or allergic reaction to cisplatin, carboplatin, oxaliplatin, other medicines, foods, dyes, or  preservatives  pregnant or trying to get pregnant  breast-feeding How should I use this medicine? This drug is given as an infusion into a vein. It is administered in a hospital or clinic by a specially trained health care professional. Talk to your pediatrician regarding the use of this medicine in children. Special care may be needed. Overdosage: If you think you have taken too much of this medicine contact a poison control center or emergency room at once. NOTE: This medicine is only for you. Do not share this medicine with others. What if I miss a dose? It is important not to miss a dose. Call your doctor or health care professional if you are unable to keep an appointment. What may interact with this medicine? This medicine may interact with the following medications:  foscarnet  certain antibiotics like amikacin, gentamicin, neomycin, polymyxin B, streptomycin, tobramycin, vancomycin This list may not describe all possible interactions. Give your health care provider a list of all the medicines, herbs, non-prescription drugs, or dietary supplements you use. Also tell them if you smoke, drink alcohol, or use illegal drugs. Some items may interact with your medicine. What should I watch for while using this medicine? Your condition will be monitored carefully while you are receiving this medicine. You will need important blood work done while you are taking this medicine. This drug may make you feel generally unwell. This is not uncommon, as chemotherapy can affect healthy cells as well as cancer cells. Report any side effects. Continue your course of treatment even though you feel ill unless your doctor tells you to stop. This medicine may increase your risk of getting an infection. Call your healthcare professional for advice if you get a fever, chills, or sore throat, or other symptoms of a cold or flu. Do not treat yourself. Try to avoid being around people who are sick. Avoid taking  medicines that contain aspirin, acetaminophen, ibuprofen, naproxen, or ketoprofen unless instructed by your healthcare professional. These medicines may hide a fever. This medicine may increase your risk to bruise or bleed. Call your doctor or health care professional if you notice any unusual bleeding. Be careful brushing and flossing your teeth or using a toothpick because you may get an infection or bleed more easily. If you have any dental work done, tell your dentist you are receiving this medicine. Do not become pregnant while taking this medicine or for 14 months after stopping it. Women should inform their healthcare professional if they wish to become pregnant or think they might be pregnant. Men should not father a child while taking this medicine and for 11 months after stopping it. There is potential for serious side effects to an unborn child. Talk to your healthcare professional for more information. Do not breast-feed an infant while taking this medicine. This medicine has caused ovarian failure in some women. This medicine may make it more  difficult to get pregnant. Talk to your healthcare professional if you are concerned about your fertility. This medicine has caused decreased sperm counts in some men. This may make it more difficult to father a child. Talk to your healthcare professional if you are concerned about your fertility. Drink fluids as directed while you are taking this medicine. This will help protect your kidneys. Call your doctor or health care professional if you get diarrhea. Do not treat yourself. What side effects may I notice from receiving this medicine? Side effects that you should report to your doctor or health care professional as soon as possible:  allergic reactions like skin rash, itching or hives, swelling of the face, lips, or tongue  blurred vision  changes in vision  decreased hearing or ringing of the ears  nausea, vomiting  pain, redness, or  irritation at site where injected  pain, tingling, numbness in the hands or feet  signs and symptoms of bleeding such as bloody or black, tarry stools; red or dark brown urine; spitting up blood or brown material that looks like coffee grounds; red spots on the skin; unusual bruising or bleeding from the eyes, gums, or nose  signs and symptoms of infection like fever; chills; cough; sore throat; pain or trouble passing urine  signs and symptoms of kidney injury like trouble passing urine or change in the amount of urine  signs and symptoms of low red blood cells or anemia such as unusually weak or tired; feeling faint or lightheaded; falls; breathing problems Side effects that usually do not require medical attention (report to your doctor or health care professional if they continue or are bothersome):  loss of appetite  mouth sores  muscle cramps This list may not describe all possible side effects. Call your doctor for medical advice about side effects. You may report side effects to FDA at 1-800-FDA-1088. Where should I keep my medicine? This drug is given in a hospital or clinic and will not be stored at home. NOTE: This sheet is a summary. It may not cover all possible information. If you have questions about this medicine, talk to your doctor, pharmacist, or health care provider.  2020 Elsevier/Gold Standard (2018-08-23 15:59:17)

## 2020-06-21 ENCOUNTER — Other Ambulatory Visit: Payer: Self-pay | Admitting: Internal Medicine

## 2020-06-21 ENCOUNTER — Ambulatory Visit: Payer: Medicare Other

## 2020-06-21 ENCOUNTER — Other Ambulatory Visit: Payer: Medicare Other

## 2020-06-21 MED ORDER — TEMAZEPAM 15 MG PO CAPS: 15.0000 mg | ORAL_CAPSULE | Freq: Every evening | ORAL | 0 refills | Status: DC | PRN

## 2020-06-24 ENCOUNTER — Other Ambulatory Visit: Payer: Self-pay

## 2020-06-24 ENCOUNTER — Inpatient Hospital Stay: Payer: Medicare Other

## 2020-06-24 DIAGNOSIS — Z5111 Encounter for antineoplastic chemotherapy: Secondary | ICD-10-CM | POA: Diagnosis not present

## 2020-06-24 DIAGNOSIS — C3491 Malignant neoplasm of unspecified part of right bronchus or lung: Secondary | ICD-10-CM

## 2020-06-24 LAB — CBC WITH DIFFERENTIAL (CANCER CENTER ONLY)
Abs Immature Granulocytes: 0.02 10*3/uL (ref 0.00–0.07)
Basophils Absolute: 0 10*3/uL (ref 0.0–0.1)
Basophils Relative: 0 %
Eosinophils Absolute: 0.2 10*3/uL (ref 0.0–0.5)
Eosinophils Relative: 3 %
HCT: 40.9 % (ref 39.0–52.0)
Hemoglobin: 13.8 g/dL (ref 13.0–17.0)
Immature Granulocytes: 0 %
Lymphocytes Relative: 16 %
Lymphs Abs: 1 10*3/uL (ref 0.7–4.0)
MCH: 30.5 pg (ref 26.0–34.0)
MCHC: 33.7 g/dL (ref 30.0–36.0)
MCV: 90.5 fL (ref 80.0–100.0)
Monocytes Absolute: 0.4 10*3/uL (ref 0.1–1.0)
Monocytes Relative: 6 %
Neutro Abs: 4.6 10*3/uL (ref 1.7–7.7)
Neutrophils Relative %: 75 %
Platelet Count: 178 10*3/uL (ref 150–400)
RBC: 4.52 MIL/uL (ref 4.22–5.81)
RDW: 12.4 % (ref 11.5–15.5)
WBC Count: 6.1 10*3/uL (ref 4.0–10.5)
nRBC: 0 % (ref 0.0–0.2)

## 2020-06-24 LAB — CMP (CANCER CENTER ONLY)
ALT: 31 U/L (ref 0–44)
AST: 68 U/L — ABNORMAL HIGH (ref 15–41)
Albumin: 3.6 g/dL (ref 3.5–5.0)
Alkaline Phosphatase: 87 U/L (ref 38–126)
Anion gap: 3 — ABNORMAL LOW (ref 5–15)
BUN: 22 mg/dL (ref 8–23)
CO2: 29 mmol/L (ref 22–32)
Calcium: 9.2 mg/dL (ref 8.9–10.3)
Chloride: 101 mmol/L (ref 98–111)
Creatinine: 1.04 mg/dL (ref 0.61–1.24)
GFR, Estimated: >60 mL/min (ref >60)
Glucose, Bld: 198 mg/dL — ABNORMAL HIGH (ref 70–99)
Potassium: 4.7 mmol/L (ref 3.5–5.1)
Sodium: 133 mmol/L — ABNORMAL LOW (ref 135–145)
Total Bilirubin: 1 mg/dL (ref 0.3–1.2)
Total Protein: 6.7 g/dL (ref 6.5–8.1)

## 2020-06-24 LAB — MAGNESIUM: Magnesium: 2.1 mg/dL (ref 1.7–2.4)

## 2020-06-28 ENCOUNTER — Other Ambulatory Visit: Payer: Medicare Other

## 2020-06-30 ENCOUNTER — Encounter: Payer: Self-pay | Admitting: *Deleted

## 2020-06-30 NOTE — Progress Notes (Signed)
Oncology Nurse Navigator Documentation  Oncology Nurse Navigator Flowsheets 06/30/2020  Abnormal Finding Date 02/06/2020  Confirmed Diagnosis Date 02/24/2020  Diagnosis Status Confirmed Diagnosis Complete  Planned Course of Treatment Chemotherapy;Surgery  Phase of Treatment Chemo  Chemotherapy Actual Start Date: 06/17/2020  Surgery Actual Start Date: 04/28/2020  Navigator Follow Up Date: 06/30/2020  Navigator Follow Up Reason: Appointment Review;Review Note  Navigation Complete Date: 06/30/2020  Post Navigation: Continue to Follow Patient? No  Reason Not Navigating Patient: Clinical Trial  Navigator Location [facility]  Navigator Encounter Type Other:  Treatment Initiated Date 04/28/2020  Treatment Phase Treatment  Barriers/Navigation Needs Coordination of Care  Interventions Coordination of Care  Acuity Level 2-Minimal Needs (1-2 Barriers Identified)  Coordination of Care Other  Time Spent with Patient 30

## 2020-07-01 ENCOUNTER — Other Ambulatory Visit: Payer: Self-pay

## 2020-07-01 ENCOUNTER — Inpatient Hospital Stay: Payer: Medicare Other

## 2020-07-01 DIAGNOSIS — C3491 Malignant neoplasm of unspecified part of right bronchus or lung: Secondary | ICD-10-CM

## 2020-07-01 DIAGNOSIS — Z5111 Encounter for antineoplastic chemotherapy: Secondary | ICD-10-CM | POA: Diagnosis not present

## 2020-07-01 LAB — CMP (CANCER CENTER ONLY)
ALT: 20 U/L (ref 0–44)
AST: 14 U/L — ABNORMAL LOW (ref 15–41)
Albumin: 3.4 g/dL — ABNORMAL LOW (ref 3.5–5.0)
Alkaline Phosphatase: 108 U/L (ref 38–126)
Anion gap: 9 (ref 5–15)
BUN: 18 mg/dL (ref 8–23)
CO2: 24 mmol/L (ref 22–32)
Calcium: 9.4 mg/dL (ref 8.9–10.3)
Chloride: 104 mmol/L (ref 98–111)
Creatinine: 0.88 mg/dL (ref 0.61–1.24)
GFR, Estimated: >60 mL/min (ref >60)
Glucose, Bld: 153 mg/dL — ABNORMAL HIGH (ref 70–99)
Potassium: 4.5 mmol/L (ref 3.5–5.1)
Sodium: 137 mmol/L (ref 135–145)
Total Bilirubin: 0.4 mg/dL (ref 0.3–1.2)
Total Protein: 6.8 g/dL (ref 6.5–8.1)

## 2020-07-01 LAB — CBC WITH DIFFERENTIAL (CANCER CENTER ONLY)
Abs Immature Granulocytes: 0.02 10*3/uL (ref 0.00–0.07)
Basophils Absolute: 0 10*3/uL (ref 0.0–0.1)
Basophils Relative: 0 %
Eosinophils Absolute: 0.1 10*3/uL (ref 0.0–0.5)
Eosinophils Relative: 2 %
HCT: 38.5 % — ABNORMAL LOW (ref 39.0–52.0)
Hemoglobin: 12.8 g/dL — ABNORMAL LOW (ref 13.0–17.0)
Immature Granulocytes: 0 %
Lymphocytes Relative: 22 %
Lymphs Abs: 1.2 10*3/uL (ref 0.7–4.0)
MCH: 30.5 pg (ref 26.0–34.0)
MCHC: 33.2 g/dL (ref 30.0–36.0)
MCV: 91.7 fL (ref 80.0–100.0)
Monocytes Absolute: 0.4 10*3/uL (ref 0.1–1.0)
Monocytes Relative: 8 %
Neutro Abs: 3.9 10*3/uL (ref 1.7–7.7)
Neutrophils Relative %: 68 %
Platelet Count: 177 10*3/uL (ref 150–400)
RBC: 4.2 MIL/uL — ABNORMAL LOW (ref 4.22–5.81)
RDW: 12.5 % (ref 11.5–15.5)
WBC Count: 5.7 10*3/uL (ref 4.0–10.5)
nRBC: 0 % (ref 0.0–0.2)

## 2020-07-01 LAB — MAGNESIUM: Magnesium: 2 mg/dL (ref 1.7–2.4)

## 2020-07-06 ENCOUNTER — Telehealth: Payer: Self-pay

## 2020-07-06 NOTE — Telephone Encounter (Signed)
Pt LM stating he has tx tomorrow and took his first dose of steroid and also received an injection of steroid in his should today. Pt wants to know if he is to still take his oral steroid.  Discussed with Cassie, PA-C who indicated pt is to still take his oral steroids.  I called pt and advised as indicated. Pt expressed understanding of this information.

## 2020-07-07 ENCOUNTER — Other Ambulatory Visit: Payer: Self-pay

## 2020-07-07 ENCOUNTER — Encounter: Payer: Self-pay | Admitting: Internal Medicine

## 2020-07-07 ENCOUNTER — Inpatient Hospital Stay: Payer: Medicare Other

## 2020-07-07 ENCOUNTER — Inpatient Hospital Stay (HOSPITAL_BASED_OUTPATIENT_CLINIC_OR_DEPARTMENT_OTHER): Payer: Medicare Other | Admitting: Internal Medicine

## 2020-07-07 VITALS — BP 114/74 | HR 89 | Temp 98.8°F | Resp 18 | Ht 73.0 in | Wt 210.1 lb

## 2020-07-07 DIAGNOSIS — C3491 Malignant neoplasm of unspecified part of right bronchus or lung: Secondary | ICD-10-CM | POA: Diagnosis not present

## 2020-07-07 DIAGNOSIS — Z5111 Encounter for antineoplastic chemotherapy: Secondary | ICD-10-CM

## 2020-07-07 DIAGNOSIS — I2584 Coronary atherosclerosis due to calcified coronary lesion: Secondary | ICD-10-CM

## 2020-07-07 DIAGNOSIS — I2510 Atherosclerotic heart disease of native coronary artery without angina pectoris: Secondary | ICD-10-CM

## 2020-07-07 LAB — CMP (CANCER CENTER ONLY)
ALT: 15 U/L (ref 0–44)
AST: 12 U/L — ABNORMAL LOW (ref 15–41)
Albumin: 3.6 g/dL (ref 3.5–5.0)
Alkaline Phosphatase: 96 U/L (ref 38–126)
Anion gap: 8 (ref 5–15)
BUN: 15 mg/dL (ref 8–23)
CO2: 25 mmol/L (ref 22–32)
Calcium: 9.5 mg/dL (ref 8.9–10.3)
Chloride: 102 mmol/L (ref 98–111)
Creatinine: 0.82 mg/dL (ref 0.61–1.24)
GFR, Estimated: >60 mL/min (ref >60)
Glucose, Bld: 176 mg/dL — ABNORMAL HIGH (ref 70–99)
Potassium: 4.1 mmol/L (ref 3.5–5.1)
Sodium: 135 mmol/L (ref 135–145)
Total Bilirubin: 0.5 mg/dL (ref 0.3–1.2)
Total Protein: 6.9 g/dL (ref 6.5–8.1)

## 2020-07-07 LAB — CBC WITH DIFFERENTIAL (CANCER CENTER ONLY)
Abs Immature Granulocytes: 0.03 10*3/uL (ref 0.00–0.07)
Basophils Absolute: 0 10*3/uL (ref 0.0–0.1)
Basophils Relative: 0 %
Eosinophils Absolute: 0 10*3/uL (ref 0.0–0.5)
Eosinophils Relative: 0 %
HCT: 38.6 % — ABNORMAL LOW (ref 39.0–52.0)
Hemoglobin: 12.8 g/dL — ABNORMAL LOW (ref 13.0–17.0)
Immature Granulocytes: 1 %
Lymphocytes Relative: 18 %
Lymphs Abs: 1 10*3/uL (ref 0.7–4.0)
MCH: 30.2 pg (ref 26.0–34.0)
MCHC: 33.2 g/dL (ref 30.0–36.0)
MCV: 91 fL (ref 80.0–100.0)
Monocytes Absolute: 0.6 10*3/uL (ref 0.1–1.0)
Monocytes Relative: 11 %
Neutro Abs: 4 10*3/uL (ref 1.7–7.7)
Neutrophils Relative %: 70 %
Platelet Count: 266 10*3/uL (ref 150–400)
RBC: 4.24 MIL/uL (ref 4.22–5.81)
RDW: 12.6 % (ref 11.5–15.5)
WBC Count: 5.7 10*3/uL (ref 4.0–10.5)
nRBC: 0 % (ref 0.0–0.2)

## 2020-07-07 LAB — MAGNESIUM: Magnesium: 2.1 mg/dL (ref 1.7–2.4)

## 2020-07-07 MED ORDER — SODIUM CHLORIDE 0.9 % IV SOLN
500.0000 mg/m2 | Freq: Once | INTRAVENOUS | Status: AC
  Administered 2020-07-07: 1100 mg via INTRAVENOUS
  Filled 2020-07-07: qty 40

## 2020-07-07 MED ORDER — PALONOSETRON HCL INJECTION 0.25 MG/5ML
INTRAVENOUS | Status: AC
  Filled 2020-07-07: qty 5

## 2020-07-07 MED ORDER — SODIUM CHLORIDE 0.9 % IV SOLN
Freq: Once | INTRAVENOUS | Status: AC
  Filled 2020-07-07: qty 250

## 2020-07-07 MED ORDER — ONDANSETRON HCL 8 MG PO TABS: 8.0000 mg | ORAL_TABLET | Freq: Three times a day (TID) | ORAL | 0 refills | Status: DC | PRN

## 2020-07-07 MED ORDER — SODIUM CHLORIDE 0.9 % IV SOLN
Freq: Once | INTRAVENOUS | Status: AC
  Filled 2020-07-07: qty 10

## 2020-07-07 MED ORDER — SODIUM CHLORIDE 0.9 % IV SOLN
75.0000 mg/m2 | Freq: Once | INTRAVENOUS | Status: AC
  Administered 2020-07-07: 167 mg via INTRAVENOUS
  Filled 2020-07-07: qty 167

## 2020-07-07 MED ORDER — SODIUM CHLORIDE 0.9 % IV SOLN
10.0000 mg | Freq: Once | INTRAVENOUS | Status: AC
  Administered 2020-07-07: 10 mg via INTRAVENOUS
  Filled 2020-07-07: qty 10

## 2020-07-07 MED ORDER — SODIUM CHLORIDE 0.9 % IV SOLN
150.0000 mg | Freq: Once | INTRAVENOUS | Status: AC
  Administered 2020-07-07: 150 mg via INTRAVENOUS
  Filled 2020-07-07: qty 150

## 2020-07-07 MED ORDER — PALONOSETRON HCL INJECTION 0.25 MG/5ML
0.2500 mg | Freq: Once | INTRAVENOUS | Status: AC
  Administered 2020-07-07: 0.25 mg via INTRAVENOUS

## 2020-07-07 NOTE — Progress Notes (Signed)
[place] [phone]   [phone]  OFFICE PROGRESS NOTE  Gaynelle Arabian, MD 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  PRIOR THERAPY:  1) Status post right lower lobectomy with lymph node dissection under the care of Dr. Kipp Brood on 04/28/2020.  CURRENT THERAPY: Adjuvant systemic chemotherapy with cisplatin 80 mg per metered squared and Alimta 500 mg per metered squared IV every 3 weeks.  First dose expected on 06/16/2020.  INTERVAL HISTORY: [name] [name] 69 y.o. male returns to the clinic today for follow-up visit.  The patient is feeling fine today with no concerning complaints.  He tolerated the first cycle of his treatment fairly well except for nausea few days after the treatment.  He took Compazine but was complicated by constipation.  He denied having any current chest pain, shortness of breath, cough or hemoptysis.  He denied having any fever or chills.  He has no nausea, vomiting, diarrhea or constipation.  He is here today for evaluation before starting cycle #2.  MEDICAL HISTORY: Past Medical History:  Diagnosis Date  . Arthritis   . Cancer (HCC)    RIGHT LUNG  . Carpal tunnel syndrome    Right  . Cataracts, bilateral    very mild  . Early cataracts, bilateral   . Fatty liver   . Gallstones   . GERD (gastroesophageal reflux disease)   . Headache 2011   HX OF VISUAL MIGRAINES - NO PAIN --MIGRAINES ATTRIBUTED TO PT CUTTING NICOTINE PATCH IN HALF BEFORE APPLYING TO HIS SKIN.    Marland Kitchen Hepatitis 2001   C -treated and no problems since  2001 'cured'  . History of hiatal hernia   . History of kidney stones    found on CT Scan  . HOH (hard of hearing)   . Muscle cramps    hands and legs  . Osteopenia   . Pain    KNEE OA  . Right lower lobe lung mass    . Shortness of breath dyspnea    "SOMETIMES FEELS LIKE I CAN'T CATCH A DEEP BREATH "- ON GOING FOR YEARS - [place] WORK UP 2011 [place]  . Stroke ([place])    HX POSSIBLE TIA - RT LEG NUMBNESS AND TINGLING FINGERS AND NUMBNESS RT FACE - ALL RESOLVED ; STATES NEUROLOGIST COULD NOT FIND ANY EVIDENCE OF STROKE - THOUGHT PT'S EPISODE MIGHT BE FROM CUTTING NICOTINE PATCH IN HALF AND APPLYING TO SKIN  . Trigger finger    LEFT MIDDLE FINGER  . Wears glasses     ALLERGIES:  is allergic to statins.  MEDICATIONS:  Current Outpatient Medications  Medication Sig Dispense Refill  . acetaminophen (TYLENOL) 500 MG tablet Take 500 mg by mouth every 6 (six) hours as needed.    . Alirocumab (PRALUENT) 150 MG/ML SOAJ Inject 1 Dose into the skin every 14 (fourteen) days. (Patient taking differently: Inject 1 Dose into the skin every 14 (fourteen) days. ) 2 pen 11  . clindamycin (CLEOCIN) 150 MG capsule Take 150-300 mg by mouth See admin instructions. 300 mg 1 hour before Dental appt, 150 mg 6 hours after appt    . cyclobenzaprine (FLEXERIL) 10 MG tablet Take 10 mg by mouth at bedtime.     Marland Kitchen dexamethasone (DECADRON) 4  MG tablet Take ONE tablet TWICE a day the day before, the day of, and the day after chemothearpy 40 tablet 2  . folic acid (FOLVITE) 1 MG tablet Take 1 tablet (1 mg total) by mouth daily. 30 tablet 2  . loratadine (CLARITIN) 10 MG tablet Take 10 mg by mouth daily. AllerClear    . Magnesium 500 MG TABS Take 500 mg by mouth in the morning, at noon, and at bedtime.     . Melatonin 10 MG TABS Take 10 mg by mouth at bedtime.    . prochlorperazine (COMPAZINE) 10 MG tablet Take 1 tablet (10 mg total) by mouth every 6 (six) hours as needed. 30 tablet 2  . temazepam (RESTORIL) 15 MG capsule Take 1 capsule (15 mg total) by mouth at bedtime as needed for sleep. 30 capsule 0   No current facility-administered medications for this visit.    SURGICAL HISTORY:   Past Surgical History:  Procedure Laterality Date  . BRONCHIAL BIOPSY  02/24/2020   Procedure: BRONCHIAL BIOPSIES;  Surgeon: Collene Gobble, MD;  Location: [facility] ENDOSCOPY;  Service: Pulmonary;;  . BRONCHIAL BRUSHINGS  02/24/2020   Procedure: BRONCHIAL BRUSHINGS;  Surgeon: Collene Gobble, MD;  Location: [facility] ENDOSCOPY;  Service: Pulmonary;;  . BRONCHIAL NEEDLE ASPIRATION BIOPSY  02/24/2020   Procedure: BRONCHIAL NEEDLE ASPIRATION BIOPSIES;  Surgeon: Collene Gobble, MD;  Location: [facility] ENDOSCOPY;  Service: Pulmonary;;  . BRONCHIAL WASHINGS  02/24/2020   Procedure: BRONCHIAL WASHINGS;  Surgeon: Collene Gobble, MD;  Location: [facility] ENDOSCOPY;  Service: Pulmonary;;  . COLONOSCOPY    . INTERCOSTAL NERVE BLOCK Right 04/28/2020   Procedure: INTERCOSTAL NERVE BLOCK;  Surgeon: Lajuana Matte, MD;  Location: [place];  Service: Thoracic;  Laterality: Right;  . left ankle surgery     fractured  . left knee surgery     MENISCAL REMOVED  . left shoulder     torn ligaments left shoulder  . LUMBAR FUSION     L3 L4 L5  . NODE DISSECTION Right 04/28/2020   Procedure: NODE DISSECTION;  Surgeon: Lajuana Matte, MD;  Location: [place];  Service: Thoracic;  Laterality: Right;  . RIGHT CARPAL TUNNEL RELEASE  2011  . RIGHT KNEE ARTHROSCOPY   AUG OR SEPT 2015   AT [facility]  . ROOT CANAL    . THORACOSCOPY Right 04/28/2020   XI ROBOTIC ASSISTED THORASCOPY-LOBECTOMY (Right)  . TONSILLECTOMY     as child  . 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 TOTAL KNEE ARTHROPLASTY Right 07/21/2014   Procedure: RIGHT TOTAL KNEE ARTHROPLASTY;  Surgeon: Mauri Pole, MD;  Location: [facility];  Service: Orthopedics;  Laterality: Right;  . TOTAL SHOULDER ARTHROPLASTY Right 10/17/2019   Procedure: RIGHT REVERSE TOTAL SHOULDER REPLACEMENT;  Surgeon: Netta Cedars, MD;  Location:  [facility];  Service: Orthopedics;  Laterality: Right;  interscalene block  . TRIGGER FINGER RELEASE Left   . VIDEO BRONCHOSCOPY WITH ENDOBRONCHIAL NAVIGATION Right 02/24/2020   Procedure: VIDEO BRONCHOSCOPY WITH ENDOBRONCHIAL NAVIGATION;  Surgeon: Collene Gobble, MD;  Location: [facility] ENDOSCOPY;  Service: Pulmonary;  Laterality: Right;  . WEDGE RESECTION Right 04/28/2020   XI ROBOTIC ASSISTED THORASCOPY-RIGHT LOWER LOBE WEDGE RESECTION (Right Chest)     REVIEW OF SYSTEMS:  A comprehensive review of systems was negative except for: Gastrointestinal: positive for nausea   PHYSICAL EXAMINATION:  General appearance: alert, cooperative and no distress Head: Normocephalic, without obvious abnormality, atraumatic Neck: no adenopathy, no JVD, supple, symmetrical, trachea midline and thyroid not enlarged, symmetric, no tenderness/mass/nodules Lymph nodes: Cervical, supraclavicular, and axillary nodes normal. Resp: clear to auscultation bilaterally Back: symmetric, no curvature. ROM normal. No CVA tenderness. Cardio: regular rate and rhythm, S1, S2 normal, no murmur, click, rub or gallop GI: soft, non-tender; bowel sounds normal; no masses,  no organomegaly Extremities: extremities normal, atraumatic, no cyanosis or edema  ECOG PERFORMANCE STATUS: 1 - Symptomatic but completely ambulatory   Blood pressure 114/74, pulse 89, temperature 98.8 F (37.1 C), temperature source Tympanic, resp. rate 18, height 6\' 1"  (1.854 m), weight 210 lb 1.6 oz (95.3 kg), SpO2 100 %.  LABORATORY DATA: Lab Results  Component Value Date   WBC 5.7 07/07/2020   HGB 12.8 (L) 07/07/2020   HCT 38.6 (L) 07/07/2020   MCV 91.0 07/07/2020   PLT 266 07/07/2020      Chemistry      Component Value Date/Time   NA 137 07/01/2020 1047   K 4.5 07/01/2020 1047   CL 104 07/01/2020 1047   CO2 24 07/01/2020 1047   BUN 18 07/01/2020 1047   CREATININE 0.88 07/01/2020 1047      Component Value Date/Time   CALCIUM 9.4 07/01/2020 1047    ALKPHOS 108 07/01/2020 1047   AST 14 (L) 07/01/2020 1047   ALT 20 07/01/2020 1047   BILITOT 0.4 07/01/2020 1047       RADIOGRAPHIC STUDIES: No results found.  ASSESSMENT AND PLAN: This is a very pleasant 69 years old white male with a stage IIIa non-small cell lung cancer, mucinous adenocarcinoma status post right lower lobectomy with lymph node dissection in August 2021. The patient is currently undergoing adjuvant systemic chemotherapy with cisplatin and Alimta status post 1 cycle. He tolerated the first cycle of his treatment well except for delayed nausea.  He has Compazine at home but this was complicated by constipation.  I will start the patient on Zofran to be used few days after his treatment.  I also asked him to extend the Decadron 4 mg p.o. twice daily for 4-5-days after his treatment. The patient will come back for follow-up visit in 3 weeks for evaluation before starting cycle #3. He was advised to call immediately if he has any concerning symptoms in the interval. The patient voices understanding of current disease status and treatment options and is in agreement with the current care plan.  All questions were answered. The patient knows to call the clinic with any problems, questions or concerns. We can certainly see the patient much sooner if necessary.  Disclaimer: This note was dictated with voice recognition software. Similar sounding words can inadvertently be transcribed and may not be corrected upon review.

## 2020-07-07 NOTE — Patient Instructions (Signed)
[place] Discharge Instructions for Patients Receiving Chemotherapy  Today you received the following chemotherapy agents: pemetrexed and cisplatin.  To help prevent nausea and vomiting after your treatment, we encourage you to take your nausea medication as prescribed.   If you develop nausea and vomiting that is not controlled by your nausea medication, call the clinic.   BELOW ARE SYMPTOMS THAT SHOULD BE REPORTED IMMEDIATELY:  *FEVER GREATER THAN 100.5 F  *CHILLS WITH OR WITHOUT FEVER  NAUSEA AND VOMITING THAT IS NOT CONTROLLED WITH YOUR NAUSEA MEDICATION  *UNUSUAL SHORTNESS OF BREATH  *UNUSUAL BRUISING OR BLEEDING  TENDERNESS IN MOUTH AND THROAT WITH OR WITHOUT PRESENCE OF ULCERS  *URINARY PROBLEMS  *BOWEL PROBLEMS  UNUSUAL RASH Items with * indicate a potential emergency and should be followed up as soon as possible.  Feel free to call the clinic should you have any questions or concerns. The clinic phone number is [phone].  Please show the [place] at check-in to the Emergency Department and triage nurse.

## 2020-07-12 ENCOUNTER — Inpatient Hospital Stay: Payer: Medicare Other | Attending: Internal Medicine

## 2020-07-12 ENCOUNTER — Other Ambulatory Visit: Payer: Self-pay

## 2020-07-12 DIAGNOSIS — Z5111 Encounter for antineoplastic chemotherapy: Secondary | ICD-10-CM | POA: Insufficient documentation

## 2020-07-12 DIAGNOSIS — C3491 Malignant neoplasm of unspecified part of right bronchus or lung: Secondary | ICD-10-CM

## 2020-07-12 DIAGNOSIS — C3431 Malignant neoplasm of lower lobe, right bronchus or lung: Secondary | ICD-10-CM | POA: Diagnosis present

## 2020-07-12 LAB — CMP (CANCER CENTER ONLY)
ALT: 24 U/L (ref 0–44)
AST: 17 U/L (ref 15–41)
Albumin: 3.6 g/dL (ref 3.5–5.0)
Alkaline Phosphatase: 82 U/L (ref 38–126)
Anion gap: 9 (ref 5–15)
BUN: 31 mg/dL — ABNORMAL HIGH (ref 8–23)
CO2: 26 mmol/L (ref 22–32)
Calcium: 9.2 mg/dL (ref 8.9–10.3)
Chloride: 99 mmol/L (ref 98–111)
Creatinine: 1.11 mg/dL (ref 0.61–1.24)
GFR, Estimated: >60 mL/min (ref >60)
Glucose, Bld: 172 mg/dL — ABNORMAL HIGH (ref 70–99)
Potassium: 4 mmol/L (ref 3.5–5.1)
Sodium: 134 mmol/L — ABNORMAL LOW (ref 135–145)
Total Bilirubin: 0.8 mg/dL (ref 0.3–1.2)
Total Protein: 6.6 g/dL (ref 6.5–8.1)

## 2020-07-12 LAB — CBC WITH DIFFERENTIAL (CANCER CENTER ONLY)
Abs Immature Granulocytes: 0.05 10*3/uL (ref 0.00–0.07)
Basophils Absolute: 0 10*3/uL (ref 0.0–0.1)
Basophils Relative: 0 %
Eosinophils Absolute: 0 10*3/uL (ref 0.0–0.5)
Eosinophils Relative: 0 %
HCT: 42.5 % (ref 39.0–52.0)
Hemoglobin: 14.1 g/dL (ref 13.0–17.0)
Immature Granulocytes: 0 %
Lymphocytes Relative: 14 %
Lymphs Abs: 1.6 10*3/uL (ref 0.7–4.0)
MCH: 30.5 pg (ref 26.0–34.0)
MCHC: 33.2 g/dL (ref 30.0–36.0)
MCV: 92 fL (ref 80.0–100.0)
Monocytes Absolute: 0.2 10*3/uL (ref 0.1–1.0)
Monocytes Relative: 2 %
Neutro Abs: 9.9 10*3/uL — ABNORMAL HIGH (ref 1.7–7.7)
Neutrophils Relative %: 84 %
Platelet Count: 233 10*3/uL (ref 150–400)
RBC: 4.62 MIL/uL (ref 4.22–5.81)
RDW: 12.8 % (ref 11.5–15.5)
WBC Count: 11.8 10*3/uL — ABNORMAL HIGH (ref 4.0–10.5)
nRBC: 0 % (ref 0.0–0.2)

## 2020-07-12 LAB — MAGNESIUM: Magnesium: 1.9 mg/dL (ref 1.7–2.4)

## 2020-07-15 ENCOUNTER — Other Ambulatory Visit: Payer: Self-pay | Admitting: Internal Medicine

## 2020-07-15 ENCOUNTER — Encounter: Payer: Self-pay | Admitting: Internal Medicine

## 2020-07-18 ENCOUNTER — Other Ambulatory Visit: Payer: Self-pay | Admitting: Internal Medicine

## 2020-07-19 ENCOUNTER — Encounter: Payer: Self-pay | Admitting: Internal Medicine

## 2020-07-19 ENCOUNTER — Other Ambulatory Visit: Payer: Self-pay

## 2020-07-19 ENCOUNTER — Inpatient Hospital Stay: Payer: Medicare Other

## 2020-07-19 DIAGNOSIS — C3491 Malignant neoplasm of unspecified part of right bronchus or lung: Secondary | ICD-10-CM

## 2020-07-19 DIAGNOSIS — Z5111 Encounter for antineoplastic chemotherapy: Secondary | ICD-10-CM | POA: Diagnosis not present

## 2020-07-19 LAB — CBC WITH DIFFERENTIAL (CANCER CENTER ONLY)
Abs Immature Granulocytes: 0.03 10*3/uL (ref 0.00–0.07)
Basophils Absolute: 0 10*3/uL (ref 0.0–0.1)
Basophils Relative: 0 %
Eosinophils Absolute: 0 10*3/uL (ref 0.0–0.5)
Eosinophils Relative: 0 %
HCT: 39.3 % (ref 39.0–52.0)
Hemoglobin: 13.2 g/dL (ref 13.0–17.0)
Immature Granulocytes: 0 %
Lymphocytes Relative: 16 %
Lymphs Abs: 1.2 10*3/uL (ref 0.7–4.0)
MCH: 30.3 pg (ref 26.0–34.0)
MCHC: 33.6 g/dL (ref 30.0–36.0)
MCV: 90.3 fL (ref 80.0–100.0)
Monocytes Absolute: 0.7 10*3/uL (ref 0.1–1.0)
Monocytes Relative: 9 %
Neutro Abs: 5.5 10*3/uL (ref 1.7–7.7)
Neutrophils Relative %: 75 %
Platelet Count: 147 10*3/uL — ABNORMAL LOW (ref 150–400)
RBC: 4.35 MIL/uL (ref 4.22–5.81)
RDW: 13 % (ref 11.5–15.5)
WBC Count: 7.5 10*3/uL (ref 4.0–10.5)
nRBC: 0 % (ref 0.0–0.2)

## 2020-07-19 LAB — CMP (CANCER CENTER ONLY)
ALT: 27 U/L (ref 0–44)
AST: 18 U/L (ref 15–41)
Albumin: 3.8 g/dL (ref 3.5–5.0)
Alkaline Phosphatase: 106 U/L (ref 38–126)
Anion gap: 9 (ref 5–15)
BUN: 22 mg/dL (ref 8–23)
CO2: 23 mmol/L (ref 22–32)
Calcium: 9.5 mg/dL (ref 8.9–10.3)
Chloride: 104 mmol/L (ref 98–111)
Creatinine: 1.15 mg/dL (ref 0.61–1.24)
GFR, Estimated: >60 mL/min (ref >60)
Glucose, Bld: 129 mg/dL — ABNORMAL HIGH (ref 70–99)
Potassium: 4.7 mmol/L (ref 3.5–5.1)
Sodium: 136 mmol/L (ref 135–145)
Total Bilirubin: 1 mg/dL (ref 0.3–1.2)
Total Protein: 7.3 g/dL (ref 6.5–8.1)

## 2020-07-19 LAB — MAGNESIUM: Magnesium: 2.1 mg/dL (ref 1.7–2.4)

## 2020-07-19 NOTE — Progress Notes (Signed)
Met with patient at registration to introduce myself as Financial Resource Specialist and to offer available resources.  Discussed one-time $1000 [employer] grant and qualifications to assist with personal expenses while going through treatment.  Gave him my card if interested in applying and for any additional financial questions or concerns.  

## 2020-07-26 NOTE — Progress Notes (Signed)
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  PRIOR THERAPY: 1) Status post right lower lobectomy with lymph node dissection under the care of Dr. Kipp [name] on 04/28/2020.  CURRENT THERAPY: Adjuvant systemic chemotherapy with cisplatin 80 mg per metered squared and Alimta 500 mg per metered squared IV every 3 weeks. First dose on 06/17/2020. Status post 2 cycles.   INTERVAL HISTORY: [name] [name] 69 y.o. male returns to the clinic today for a follow-up visit.  The patient is feeling fair today without any concerning complaints except for he noticed he was feeling fatigued yesterday, even with taking his decadron pre-medication.  The patient tolerated his last cycle of chemotherapy/delayed nausea better since extending his Decadron for a few days and being given a prescription for Zofran. He did report on the 5th day following chemotherapy that he was fatigued and slept most of the day. The plan is to complete 4 cycles of adjuvant systemic chemotherapy.  Today he denies any fever, chills, or night sweats. He has gradually been losing 2lbs or so at every appointment. He denies any chest pain, cough, or hemoptysis.  He reports feeling short of breath with yawning. He denies any diarrhea. He denies any headache or visual changes.  The patient is here today for evaluation before starting cycle #3 of adjuvant chemotherapy.  MEDICAL HISTORY: Past Medical History:  Diagnosis Date  . Arthritis   . Cancer (HCC)    RIGHT LUNG  . Carpal tunnel syndrome    Right  . Cataracts, bilateral    very mild  . Early cataracts, bilateral   . Fatty liver   . Gallstones   . GERD (gastroesophageal reflux disease)   . Headache 2011   HX OF VISUAL MIGRAINES - NO PAIN  --MIGRAINES ATTRIBUTED TO PT CUTTING NICOTINE PATCH IN HALF BEFORE APPLYING TO HIS SKIN.    Marland Kitchen Hepatitis 2001   C -treated and no problems since  2001 'cured'  . History of hiatal hernia   . History of kidney stones    found on CT Scan  . HOH (hard of hearing)   . Muscle cramps    hands and legs  . Osteopenia   . Pain    KNEE OA  . Right lower lobe lung mass   . Shortness of breath dyspnea    "SOMETIMES FEELS LIKE I CAN'T CATCH A DEEP BREATH "- ON GOING FOR YEARS - [place] WORK UP 2011 - [place]  . Stroke ([place])    HX POSSIBLE TIA - RT LEG NUMBNESS AND TINGLING FINGERS AND NUMBNESS RT FACE - ALL RESOLVED ; STATES NEUROLOGIST COULD NOT FIND ANY EVIDENCE OF STROKE - THOUGHT PT'S EPISODE MIGHT BE FROM CUTTING NICOTINE PATCH IN HALF AND APPLYING TO SKIN  . Trigger finger    LEFT MIDDLE FINGER  . Wears glasses     ALLERGIES:  is allergic to statins.  MEDICATIONS:  Current Outpatient Medications  Medication Sig Dispense Refill  . acetaminophen (TYLENOL) 500 MG tablet Take 500 mg by mouth every 6 (six) hours as needed.    . Alirocumab (PRALUENT) 150 MG/ML SOAJ Inject 1 Dose into the skin every 14 (fourteen) days. (Patient taking differently: Inject 1 Dose into the skin every 14 (fourteen)  days. ) 2 pen 11  . clindamycin (CLEOCIN) 150 MG capsule Take 150-300 mg by mouth See admin instructions. 300 mg 1 hour before Dental appt, 150 mg 6 hours after appt    . cyclobenzaprine (FLEXERIL) 10 MG tablet Take 10 mg by mouth at bedtime.     Marland Kitchen dexamethasone (DECADRON) 4 MG tablet Take ONE tablet TWICE a day the day before, the day of, and the day after chemothearpy 40 tablet 2  . folic acid (FOLVITE) 1 MG tablet Take 1 tablet (1 mg total) by mouth daily. 30 tablet 2  . loratadine (CLARITIN) 10 MG tablet Take 10 mg by mouth daily. AllerClear    . Magnesium 500 MG TABS Take 500 mg by mouth in the morning, at noon, and at bedtime.     . Melatonin 10 MG TABS  Take 10 mg by mouth at bedtime.    . ondansetron (ZOFRAN) 8 MG tablet TAKE 1 TABLET BY MOUTH EVERY 8 HOURS AS NEEDED FOR NAUSEA OR VOMITING. 20 tablet 0  . prochlorperazine (COMPAZINE) 10 MG tablet Take 1 tablet (10 mg total) by mouth every 6 (six) hours as needed. (Patient not taking: Reported on 07/07/2020) 30 tablet 2  . temazepam (RESTORIL) 15 MG capsule TAKE 1 CAPSULE BY MOUTH AT BEDTIME AS NEEDED FOR SLEEP. 30 capsule 0   No current facility-administered medications for this visit.    SURGICAL HISTORY:  Past Surgical History:  Procedure Laterality Date  . BRONCHIAL BIOPSY  02/24/2020   Procedure: BRONCHIAL BIOPSIES;  Surgeon: Collene Gobble, MD;  Location: [facility] ENDOSCOPY;  Service: Pulmonary;;  . BRONCHIAL BRUSHINGS  02/24/2020   Procedure: BRONCHIAL BRUSHINGS;  Surgeon: Collene Gobble, MD;  Location: [facility] ENDOSCOPY;  Service: Pulmonary;;  . BRONCHIAL NEEDLE ASPIRATION BIOPSY  02/24/2020   Procedure: BRONCHIAL NEEDLE ASPIRATION BIOPSIES;  Surgeon: Collene Gobble, MD;  Location: [facility] ENDOSCOPY;  Service: Pulmonary;;  . BRONCHIAL WASHINGS  02/24/2020   Procedure: BRONCHIAL WASHINGS;  Surgeon: Collene Gobble, MD;  Location: [facility] ENDOSCOPY;  Service: Pulmonary;;  . COLONOSCOPY    . INTERCOSTAL NERVE BLOCK Right 04/28/2020   Procedure: INTERCOSTAL NERVE BLOCK;  Surgeon: Lajuana Matte, MD;  Location: [place];  Service: Thoracic;  Laterality: Right;  . left ankle surgery     fractured  . left knee surgery     MENISCAL REMOVED  . left shoulder     torn ligaments left shoulder  . LUMBAR FUSION     L3 L4 L5  . NODE DISSECTION Right 04/28/2020   Procedure: NODE DISSECTION;  Surgeon: Lajuana Matte, MD;  Location: [place];  Service: Thoracic;  Laterality: Right;  . RIGHT CARPAL TUNNEL RELEASE  2011  . RIGHT KNEE ARTHROSCOPY   AUG OR SEPT 2015   AT [facility]  . ROOT CANAL    . THORACOSCOPY Right 04/28/2020   XI ROBOTIC ASSISTED THORASCOPY-LOBECTOMY (Right)  .  TONSILLECTOMY     as child  . 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 TOTAL KNEE ARTHROPLASTY Right 07/21/2014   Procedure: RIGHT TOTAL KNEE ARTHROPLASTY;  Surgeon: Mauri Pole, MD;  Location: [facility];  Service: Orthopedics;  Laterality: Right;  . TOTAL SHOULDER ARTHROPLASTY Right 10/17/2019   Procedure: RIGHT REVERSE TOTAL SHOULDER REPLACEMENT;  Surgeon: Netta Cedars, MD;  Location: [facility];  Service: Orthopedics;  Laterality: Right;  interscalene  block  . TRIGGER FINGER RELEASE Left   . VIDEO BRONCHOSCOPY WITH ENDOBRONCHIAL NAVIGATION Right 02/24/2020   Procedure: VIDEO BRONCHOSCOPY WITH ENDOBRONCHIAL NAVIGATION;  Surgeon: Collene Gobble, MD;  Location: [facility] ENDOSCOPY;  Service: Pulmonary;  Laterality: Right;  . WEDGE RESECTION Right 04/28/2020   XI ROBOTIC ASSISTED THORASCOPY-RIGHT LOWER LOBE WEDGE RESECTION (Right Chest)     REVIEW OF SYSTEMS:   Review of Systems  Constitutional: Positive for fatigue. Negative for appetite change, chills, fever and unexpected weight change.  HENT: Negative for mouth sores, nosebleeds, sore throat and trouble swallowing.   Eyes: Negative for eye problems and icterus.  Respiratory: Negative for cough, hemoptysis, shortness of breath and wheezing.   Cardiovascular: Negative for chest pain and leg swelling.  Gastrointestinal: Negative for abdominal pain, constipation, diarrhea, nausea and vomiting.  Genitourinary: Negative for bladder incontinence, difficulty urinating, dysuria, frequency and hematuria.   Musculoskeletal: Negative for back pain, gait problem, neck pain and neck stiffness.  Skin: Negative for itching and rash.  Neurological: Negative for dizziness, extremity weakness, gait problem, headaches, light-headedness and seizures.  Hematological: Negative for adenopathy. Does not bruise/bleed easily.   Psychiatric/Behavioral: Negative for confusion, depression and sleep disturbance. The patient is not nervous/anxious.     PHYSICAL EXAMINATION:  Blood pressure 99/68, pulse 93, temperature 99 F (37.2 C), temperature source Tympanic, resp. rate 18, height 6\' 1"  (1.854 m), weight 208 lb 3.2 oz (94.4 kg), SpO2 99 %.  ECOG PERFORMANCE STATUS: 1 - Symptomatic but completely ambulatory  Physical Exam  Constitutional: Oriented to person, place, and time and well-developed, well-nourished, and in no distress.  HENT:  Head: Normocephalic and atraumatic.  Mouth/Throat: Oropharynx is clear and moist. No oropharyngeal exudate.  Eyes: Conjunctivae are normal. Right eye exhibits no discharge. Left eye exhibits no discharge. No scleral icterus.  Neck: Normal range of motion. Neck supple.  Cardiovascular: Normal rate, regular rhythm, normal heart sounds and intact distal pulses.   Pulmonary/Chest: Effort normal and breath sounds normal. No respiratory distress. No wheezes. No rales.  Abdominal: Soft. Bowel sounds are normal. Exhibits no distension and no mass. There is no tenderness.  Musculoskeletal: Normal range of motion. Exhibits no edema.  Lymphadenopathy:    No cervical adenopathy.  Neurological: Alert and oriented to person, place, and time. Exhibits normal muscle tone. Gait normal. Coordination normal.  Skin: Skin is warm and dry. No rash noted. Not diaphoretic. No erythema. No pallor.  Psychiatric: Mood, memory and judgment normal.  Vitals reviewed.  LABORATORY DATA: Lab Results  Component Value Date   WBC 5.3 07/28/2020   HGB 12.6 (L) 07/28/2020   HCT 37.7 (L) 07/28/2020   MCV 91.3 07/28/2020   PLT 222 07/28/2020      Chemistry      Component Value Date/Time   NA 135 07/28/2020 0839   K 4.2 07/28/2020 0839   CL 104 07/28/2020 0839   CO2 21 (L) 07/28/2020 0839   BUN 17 07/28/2020 0839   CREATININE 0.91 07/28/2020 0839      Component Value Date/Time   CALCIUM 9.0 07/28/2020  0839   ALKPHOS 92 07/28/2020 0839   AST 17 07/28/2020 0839   ALT 18 07/28/2020 0839   BILITOT 0.4 07/28/2020 0839       RADIOGRAPHIC STUDIES:  No results found.   ASSESSMENT/PLAN:  This is a very pleasant 69 year old Caucasian male with stage IIIa non-small cell lung cancer, mucinous adenocarcinoma.  He is status post a right lower lobectomy with lymph node dissection performed by Dr.  Lightfoot in August 2021.  The patient is currently undergoing adjuvant systemic chemotherapy with cisplatin and Alimta.  The plan is to complete 4 cycles.  The patient is currently status post 2 cycles.   Labs were reviewed.  Recommend that he proceed with cycle #3 today scheduled.  We will see him back for follow-up visit in 3 weeks for evaluation before starting his last cycle of treatment with cycle #4.  He will continue to extend his decadron use our a few days following his chemotherapy for his delayed nausea.  The patient was advised to call immediately if he has any concerning symptoms in the interval. The patient voices understanding of current disease status and treatment options and is in agreement with the current care plan. All questions were answered. The patient knows to call the clinic with any problems, questions or concerns. We can certainly see the patient much sooner if necessary     No orders of the defined types were placed in this encounter.    Franko Hilliker L Tine Mabee, PA-C 07/28/20

## 2020-07-28 ENCOUNTER — Other Ambulatory Visit: Payer: Self-pay

## 2020-07-28 ENCOUNTER — Inpatient Hospital Stay: Payer: Medicare Other

## 2020-07-28 ENCOUNTER — Inpatient Hospital Stay (HOSPITAL_BASED_OUTPATIENT_CLINIC_OR_DEPARTMENT_OTHER): Payer: Medicare Other | Admitting: Physician Assistant

## 2020-07-28 VITALS — BP 99/68 | HR 93 | Temp 99.0°F | Resp 18 | Ht 73.0 in | Wt 208.2 lb

## 2020-07-28 DIAGNOSIS — Z5111 Encounter for antineoplastic chemotherapy: Secondary | ICD-10-CM | POA: Diagnosis not present

## 2020-07-28 DIAGNOSIS — C3491 Malignant neoplasm of unspecified part of right bronchus or lung: Secondary | ICD-10-CM

## 2020-07-28 LAB — CMP (CANCER CENTER ONLY)
ALT: 18 U/L (ref 0–44)
AST: 17 U/L (ref 15–41)
Albumin: 3.6 g/dL (ref 3.5–5.0)
Alkaline Phosphatase: 92 U/L (ref 38–126)
Anion gap: 10 (ref 5–15)
BUN: 17 mg/dL (ref 8–23)
CO2: 21 mmol/L — ABNORMAL LOW (ref 22–32)
Calcium: 9 mg/dL (ref 8.9–10.3)
Chloride: 104 mmol/L (ref 98–111)
Creatinine: 0.91 mg/dL (ref 0.61–1.24)
GFR, Estimated: >60 mL/min (ref >60)
Glucose, Bld: 243 mg/dL — ABNORMAL HIGH (ref 70–99)
Potassium: 4.2 mmol/L (ref 3.5–5.1)
Sodium: 135 mmol/L (ref 135–145)
Total Bilirubin: 0.4 mg/dL (ref 0.3–1.2)
Total Protein: 7.1 g/dL (ref 6.5–8.1)

## 2020-07-28 LAB — CBC WITH DIFFERENTIAL (CANCER CENTER ONLY)
Abs Immature Granulocytes: 0.02 10*3/uL (ref 0.00–0.07)
Basophils Absolute: 0 10*3/uL (ref 0.0–0.1)
Basophils Relative: 0 %
Eosinophils Absolute: 0 10*3/uL (ref 0.0–0.5)
Eosinophils Relative: 0 %
HCT: 37.7 % — ABNORMAL LOW (ref 39.0–52.0)
Hemoglobin: 12.6 g/dL — ABNORMAL LOW (ref 13.0–17.0)
Immature Granulocytes: 0 %
Lymphocytes Relative: 14 %
Lymphs Abs: 0.7 10*3/uL (ref 0.7–4.0)
MCH: 30.5 pg (ref 26.0–34.0)
MCHC: 33.4 g/dL (ref 30.0–36.0)
MCV: 91.3 fL (ref 80.0–100.0)
Monocytes Absolute: 0.3 10*3/uL (ref 0.1–1.0)
Monocytes Relative: 5 %
Neutro Abs: 4.3 10*3/uL (ref 1.7–7.7)
Neutrophils Relative %: 81 %
Platelet Count: 222 10*3/uL (ref 150–400)
RBC: 4.13 MIL/uL — ABNORMAL LOW (ref 4.22–5.81)
RDW: 13.8 % (ref 11.5–15.5)
WBC Count: 5.3 10*3/uL (ref 4.0–10.5)
nRBC: 0 % (ref 0.0–0.2)

## 2020-07-28 LAB — MAGNESIUM: Magnesium: 2 mg/dL (ref 1.7–2.4)

## 2020-07-28 MED ORDER — CYANOCOBALAMIN 1000 MCG/ML IJ SOLN
INTRAMUSCULAR | Status: AC
  Filled 2020-07-28: qty 1

## 2020-07-28 MED ORDER — SODIUM CHLORIDE 0.9 % IV SOLN
Freq: Once | INTRAVENOUS | Status: AC
  Filled 2020-07-28: qty 10

## 2020-07-28 MED ORDER — PALONOSETRON HCL INJECTION 0.25 MG/5ML
0.2500 mg | Freq: Once | INTRAVENOUS | Status: AC
  Administered 2020-07-28: 0.25 mg via INTRAVENOUS

## 2020-07-28 MED ORDER — SODIUM CHLORIDE 0.9 % IV SOLN
500.0000 mg/m2 | Freq: Once | INTRAVENOUS | Status: AC
  Administered 2020-07-28: 1100 mg via INTRAVENOUS
  Filled 2020-07-28: qty 40

## 2020-07-28 MED ORDER — SODIUM CHLORIDE 0.9 % IV SOLN
150.0000 mg | Freq: Once | INTRAVENOUS | Status: AC
  Administered 2020-07-28: 150 mg via INTRAVENOUS
  Filled 2020-07-28: qty 150

## 2020-07-28 MED ORDER — SODIUM CHLORIDE 0.9 % IV SOLN
75.0000 mg/m2 | Freq: Once | INTRAVENOUS | Status: AC
  Administered 2020-07-28: 167 mg via INTRAVENOUS
  Filled 2020-07-28: qty 167

## 2020-07-28 MED ORDER — PALONOSETRON HCL INJECTION 0.25 MG/5ML
INTRAVENOUS | Status: AC
  Filled 2020-07-28: qty 5

## 2020-07-28 MED ORDER — CYANOCOBALAMIN 1000 MCG/ML IJ SOLN
1000.0000 ug | Freq: Once | INTRAMUSCULAR | Status: AC
  Administered 2020-07-28: 1000 ug via INTRAMUSCULAR

## 2020-07-28 MED ORDER — SODIUM CHLORIDE 0.9 % IV SOLN
10.0000 mg | Freq: Once | INTRAVENOUS | Status: AC
  Administered 2020-07-28: 10 mg via INTRAVENOUS
  Filled 2020-07-28: qty 10

## 2020-07-28 MED ORDER — SODIUM CHLORIDE 0.9 % IV SOLN
Freq: Once | INTRAVENOUS | Status: AC
  Filled 2020-07-28: qty 250

## 2020-07-28 NOTE — Patient Instructions (Signed)
[facility] Discharge Instructions for Patients Receiving Chemotherapy  Today you received the following chemotherapy agents: Cisplatin and Alimta  To help prevent nausea and vomiting after your treatment, we encourage you to take your nausea medication  as prescribed.    If you develop nausea and vomiting that is not controlled by your nausea medication, call the clinic.   BELOW ARE SYMPTOMS THAT SHOULD BE REPORTED IMMEDIATELY:  *FEVER GREATER THAN 100.5 F  *CHILLS WITH OR WITHOUT FEVER  NAUSEA AND VOMITING THAT IS NOT CONTROLLED WITH YOUR NAUSEA MEDICATION  *UNUSUAL SHORTNESS OF BREATH  *UNUSUAL BRUISING OR BLEEDING  TENDERNESS IN MOUTH AND THROAT WITH OR WITHOUT PRESENCE OF ULCERS  *URINARY PROBLEMS  *BOWEL PROBLEMS  UNUSUAL RASH Items with * indicate a potential emergency and should be followed up as soon as possible.  Feel free to call the clinic should you have any questions or concerns. The clinic phone number is [phone].  Please show the [place] at check-in to the Emergency Department and triage nurse.

## 2020-08-02 ENCOUNTER — Inpatient Hospital Stay: Payer: Medicare Other

## 2020-08-06 ENCOUNTER — Encounter: Payer: Self-pay | Admitting: Internal Medicine

## 2020-08-06 ENCOUNTER — Other Ambulatory Visit: Payer: Self-pay | Admitting: Internal Medicine

## 2020-08-07 ENCOUNTER — Encounter: Payer: Self-pay | Admitting: Internal Medicine

## 2020-08-09 ENCOUNTER — Inpatient Hospital Stay: Payer: Medicare Other

## 2020-08-09 ENCOUNTER — Other Ambulatory Visit: Payer: Self-pay

## 2020-08-09 ENCOUNTER — Other Ambulatory Visit: Payer: Self-pay | Admitting: Internal Medicine

## 2020-08-09 DIAGNOSIS — C3491 Malignant neoplasm of unspecified part of right bronchus or lung: Secondary | ICD-10-CM

## 2020-08-09 DIAGNOSIS — Z5111 Encounter for antineoplastic chemotherapy: Secondary | ICD-10-CM | POA: Diagnosis not present

## 2020-08-09 LAB — CBC WITH DIFFERENTIAL (CANCER CENTER ONLY)
Abs Immature Granulocytes: 0.04 10*3/uL (ref 0.00–0.07)
Basophils Absolute: 0 10*3/uL (ref 0.0–0.1)
Basophils Relative: 0 %
Eosinophils Absolute: 0 10*3/uL (ref 0.0–0.5)
Eosinophils Relative: 0 %
HCT: 38.2 % — ABNORMAL LOW (ref 39.0–52.0)
Hemoglobin: 13 g/dL (ref 13.0–17.0)
Immature Granulocytes: 0 %
Lymphocytes Relative: 9 %
Lymphs Abs: 1.1 10*3/uL (ref 0.7–4.0)
MCH: 31 pg (ref 26.0–34.0)
MCHC: 34 g/dL (ref 30.0–36.0)
MCV: 91.2 fL (ref 80.0–100.0)
Monocytes Absolute: 1.4 10*3/uL — ABNORMAL HIGH (ref 0.1–1.0)
Monocytes Relative: 10 %
Neutro Abs: 10.9 10*3/uL — ABNORMAL HIGH (ref 1.7–7.7)
Neutrophils Relative %: 81 %
Platelet Count: 151 10*3/uL (ref 150–400)
RBC: 4.19 MIL/uL — ABNORMAL LOW (ref 4.22–5.81)
RDW: 14.8 % (ref 11.5–15.5)
WBC Count: 13.5 10*3/uL — ABNORMAL HIGH (ref 4.0–10.5)
nRBC: 0 % (ref 0.0–0.2)

## 2020-08-09 LAB — CMP (CANCER CENTER ONLY)
ALT: 25 U/L (ref 0–44)
AST: 16 U/L (ref 15–41)
Albumin: 3.6 g/dL (ref 3.5–5.0)
Alkaline Phosphatase: 88 U/L (ref 38–126)
Anion gap: 11 (ref 5–15)
BUN: 21 mg/dL (ref 8–23)
CO2: 23 mmol/L (ref 22–32)
Calcium: 10.1 mg/dL (ref 8.9–10.3)
Chloride: 102 mmol/L (ref 98–111)
Creatinine: 1.14 mg/dL (ref 0.61–1.24)
GFR, Estimated: >60 mL/min (ref >60)
Glucose, Bld: 140 mg/dL — ABNORMAL HIGH (ref 70–99)
Potassium: 4.4 mmol/L (ref 3.5–5.1)
Sodium: 136 mmol/L (ref 135–145)
Total Bilirubin: 1.2 mg/dL (ref 0.3–1.2)
Total Protein: 7.4 g/dL (ref 6.5–8.1)

## 2020-08-09 LAB — MAGNESIUM: Magnesium: 1.8 mg/dL (ref 1.7–2.4)

## 2020-08-09 MED ORDER — TEMAZEPAM 15 MG PO CAPS
ORAL_CAPSULE | ORAL | 0 refills | Status: DC
Start: 2020-08-09 — End: 2020-09-13

## 2020-08-18 ENCOUNTER — Inpatient Hospital Stay: Payer: Medicare Other

## 2020-08-18 ENCOUNTER — Encounter: Payer: Self-pay | Admitting: Internal Medicine

## 2020-08-18 ENCOUNTER — Inpatient Hospital Stay (HOSPITAL_BASED_OUTPATIENT_CLINIC_OR_DEPARTMENT_OTHER): Payer: Medicare Other | Admitting: Internal Medicine

## 2020-08-18 ENCOUNTER — Inpatient Hospital Stay: Payer: Medicare Other | Attending: Genetic Counselor

## 2020-08-18 ENCOUNTER — Other Ambulatory Visit: Payer: Self-pay

## 2020-08-18 VITALS — BP 117/78 | HR 93 | Temp 98.2°F | Resp 18 | Ht 73.0 in | Wt 206.6 lb

## 2020-08-18 DIAGNOSIS — Z79899 Other long term (current) drug therapy: Secondary | ICD-10-CM | POA: Insufficient documentation

## 2020-08-18 DIAGNOSIS — C3491 Malignant neoplasm of unspecified part of right bronchus or lung: Secondary | ICD-10-CM

## 2020-08-18 DIAGNOSIS — I2510 Atherosclerotic heart disease of native coronary artery without angina pectoris: Secondary | ICD-10-CM | POA: Diagnosis not present

## 2020-08-18 DIAGNOSIS — C3490 Malignant neoplasm of unspecified part of unspecified bronchus or lung: Secondary | ICD-10-CM | POA: Diagnosis not present

## 2020-08-18 DIAGNOSIS — C3431 Malignant neoplasm of lower lobe, right bronchus or lung: Secondary | ICD-10-CM | POA: Diagnosis present

## 2020-08-18 DIAGNOSIS — I2584 Coronary atherosclerosis due to calcified coronary lesion: Secondary | ICD-10-CM

## 2020-08-18 DIAGNOSIS — Z5111 Encounter for antineoplastic chemotherapy: Secondary | ICD-10-CM | POA: Insufficient documentation

## 2020-08-18 LAB — CBC WITH DIFFERENTIAL (CANCER CENTER ONLY)
Abs Immature Granulocytes: 0.03 10*3/uL (ref 0.00–0.07)
Basophils Absolute: 0 10*3/uL (ref 0.0–0.1)
Basophils Relative: 0 %
Eosinophils Absolute: 0 10*3/uL (ref 0.0–0.5)
Eosinophils Relative: 0 %
HCT: 34.3 % — ABNORMAL LOW (ref 39.0–52.0)
Hemoglobin: 11.6 g/dL — ABNORMAL LOW (ref 13.0–17.0)
Immature Granulocytes: 0 %
Lymphocytes Relative: 9 %
Lymphs Abs: 0.8 10*3/uL (ref 0.7–4.0)
MCH: 31.3 pg (ref 26.0–34.0)
MCHC: 33.8 g/dL (ref 30.0–36.0)
MCV: 92.5 fL (ref 80.0–100.0)
Monocytes Absolute: 0.6 10*3/uL (ref 0.1–1.0)
Monocytes Relative: 7 %
Neutro Abs: 7.1 10*3/uL (ref 1.7–7.7)
Neutrophils Relative %: 84 %
Platelet Count: 229 10*3/uL (ref 150–400)
RBC: 3.71 MIL/uL — ABNORMAL LOW (ref 4.22–5.81)
RDW: 14.7 % (ref 11.5–15.5)
WBC Count: 8.5 10*3/uL (ref 4.0–10.5)
nRBC: 0 % (ref 0.0–0.2)

## 2020-08-18 LAB — CMP (CANCER CENTER ONLY)
ALT: 23 U/L (ref 0–44)
AST: 16 U/L (ref 15–41)
Albumin: 3.4 g/dL — ABNORMAL LOW (ref 3.5–5.0)
Alkaline Phosphatase: 78 U/L (ref 38–126)
Anion gap: 7 (ref 5–15)
BUN: 25 mg/dL — ABNORMAL HIGH (ref 8–23)
CO2: 23 mmol/L (ref 22–32)
Calcium: 9.6 mg/dL (ref 8.9–10.3)
Chloride: 104 mmol/L (ref 98–111)
Creatinine: 0.85 mg/dL (ref 0.61–1.24)
GFR, Estimated: >60 mL/min (ref >60)
Glucose, Bld: 177 mg/dL — ABNORMAL HIGH (ref 70–99)
Potassium: 4.2 mmol/L (ref 3.5–5.1)
Sodium: 134 mmol/L — ABNORMAL LOW (ref 135–145)
Total Bilirubin: 0.4 mg/dL (ref 0.3–1.2)
Total Protein: 7.1 g/dL (ref 6.5–8.1)

## 2020-08-18 LAB — MAGNESIUM: Magnesium: 2.2 mg/dL (ref 1.7–2.4)

## 2020-08-18 MED ORDER — SODIUM CHLORIDE 0.9 % IV SOLN
500.0000 mg/m2 | Freq: Once | INTRAVENOUS | Status: AC
  Administered 2020-08-18: 1100 mg via INTRAVENOUS
  Filled 2020-08-18: qty 40

## 2020-08-18 MED ORDER — SODIUM CHLORIDE 0.9 % IV SOLN
75.0000 mg/m2 | Freq: Once | INTRAVENOUS | Status: AC
  Administered 2020-08-18: 167 mg via INTRAVENOUS
  Filled 2020-08-18: qty 167

## 2020-08-18 MED ORDER — SODIUM CHLORIDE 0.9 % IV SOLN
10.0000 mg | Freq: Once | INTRAVENOUS | Status: AC
  Administered 2020-08-18: 10 mg via INTRAVENOUS
  Filled 2020-08-18: qty 10

## 2020-08-18 MED ORDER — SODIUM CHLORIDE 0.9 % IV SOLN
Freq: Once | INTRAVENOUS | Status: AC
  Filled 2020-08-18: qty 250

## 2020-08-18 MED ORDER — PALONOSETRON HCL INJECTION 0.25 MG/5ML
INTRAVENOUS | Status: AC
  Filled 2020-08-18: qty 5

## 2020-08-18 MED ORDER — SODIUM CHLORIDE 0.9 % IV SOLN
Freq: Once | INTRAVENOUS | Status: AC
  Filled 2020-08-18: qty 10

## 2020-08-18 MED ORDER — SODIUM CHLORIDE 0.9 % IV SOLN
150.0000 mg | Freq: Once | INTRAVENOUS | Status: AC
  Administered 2020-08-18: 150 mg via INTRAVENOUS
  Filled 2020-08-18: qty 150

## 2020-08-18 MED ORDER — PALONOSETRON HCL INJECTION 0.25 MG/5ML
0.2500 mg | Freq: Once | INTRAVENOUS | Status: AC
  Administered 2020-08-18: 0.25 mg via INTRAVENOUS

## 2020-08-18 NOTE — Patient Instructions (Signed)
[facility] Discharge Instructions for Patients Receiving Chemotherapy  Today you received the following chemotherapy agents: Cisplatin and Alimta  To help prevent nausea and vomiting after your treatment, we encourage you to take your nausea medication  as prescribed.    If you develop nausea and vomiting that is not controlled by your nausea medication, call the clinic.   BELOW ARE SYMPTOMS THAT SHOULD BE REPORTED IMMEDIATELY:  *FEVER GREATER THAN 100.5 F  *CHILLS WITH OR WITHOUT FEVER  NAUSEA AND VOMITING THAT IS NOT CONTROLLED WITH YOUR NAUSEA MEDICATION  *UNUSUAL SHORTNESS OF BREATH  *UNUSUAL BRUISING OR BLEEDING  TENDERNESS IN MOUTH AND THROAT WITH OR WITHOUT PRESENCE OF ULCERS  *URINARY PROBLEMS  *BOWEL PROBLEMS  UNUSUAL RASH Items with * indicate a potential emergency and should be followed up as soon as possible.  Feel free to call the clinic should you have any questions or concerns. The clinic phone number is [phone].  Please show the [facility] at check-in to the Emergency Department and triage nurse.

## 2020-08-18 NOTE — Progress Notes (Signed)
[facility] Telephone:([phone]   Fax:([phone]) [phone]  OFFICE PROGRESS NOTE  Gaynelle Arabian, MD 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  PRIOR THERAPY:  1) Status post right lower lobectomy with lymph node dissection under the care of Dr. Kipp Brood on 04/28/2020.  CURRENT THERAPY: Adjuvant systemic chemotherapy with cisplatin 75 mg/m2 and Alimta 500 mg/mm IV every 3 weeks.  First dose expected on 06/16/2020.  Status post 3 cycles.  INTERVAL HISTORY: [name] [name] 69 y.o. male returns to the clinic today for follow-up visit.  The patient is feeling fine today with no concerning complaints except for insomnia and visual migraine on the right side.  He denied having any current chest pain, shortness of breath, cough or hemoptysis.  He denied having any fever or chills.  He has no nausea, vomiting, diarrhea or constipation.  He he lost around 20 pounds in the last few months.  The patient is here today for evaluation before starting cycle #4 of his treatment.   MEDICAL HISTORY: Past Medical History:  Diagnosis Date  . Arthritis   . Cancer (HCC)    RIGHT LUNG  . Carpal tunnel syndrome    Right  . Cataracts, bilateral    very mild  . Early cataracts, bilateral   . Fatty liver   . Gallstones   . GERD (gastroesophageal reflux disease)   . Headache 2011   HX OF VISUAL MIGRAINES - NO PAIN --MIGRAINES ATTRIBUTED TO PT CUTTING NICOTINE PATCH IN HALF BEFORE APPLYING TO HIS SKIN.    Marland Kitchen Hepatitis 2001   C -treated and no problems since  2001 'cured'  . History of hiatal hernia   . History of kidney stones    found on CT Scan  . HOH (hard of hearing)   . Muscle cramps    hands and legs  . Osteopenia   . Pain    KNEE OA  . Right lower lobe lung mass   . Shortness of breath dyspnea     "SOMETIMES FEELS LIKE I CAN'T CATCH A DEEP BREATH "- ON GOING FOR YEARS - [employer] WORK UP 2011 - [place]  . Stroke ([place])    HX POSSIBLE TIA - RT LEG NUMBNESS AND TINGLING FINGERS AND NUMBNESS RT FACE - ALL RESOLVED ; STATES NEUROLOGIST COULD NOT FIND ANY EVIDENCE OF STROKE - THOUGHT PT'S EPISODE MIGHT BE FROM CUTTING NICOTINE PATCH IN HALF AND APPLYING TO SKIN  . Trigger finger    LEFT MIDDLE FINGER  . Wears glasses     ALLERGIES:  is allergic to statins.  MEDICATIONS:  Current Outpatient Medications  Medication Sig Dispense Refill  . acetaminophen (TYLENOL) 500 MG tablet Take 500 mg by mouth every 6 (six) hours as needed.    . Alirocumab (PRALUENT) 150 MG/ML SOAJ Inject 1 Dose into the skin every 14 (fourteen) days. (Patient taking differently: Inject 1 Dose into the skin every 14 (fourteen) days. ) 2 pen 11  . clindamycin (CLEOCIN) 150 MG capsule Take 150-300 mg by mouth See admin instructions. 300 mg 1 hour before Dental appt, 150 mg 6 hours after appt    . cyclobenzaprine (FLEXERIL) 10 MG tablet Take 10 mg by mouth at bedtime.     Marland Kitchen dexamethasone (DECADRON) 4 MG tablet  Take ONE tablet TWICE a day the day before, the day of, and the day after chemothearpy 40 tablet 2  . folic acid (FOLVITE) 1 MG tablet Take 1 tablet (1 mg total) by mouth daily. 30 tablet 2  . loratadine (CLARITIN) 10 MG tablet Take 10 mg by mouth daily. AllerClear    . Magnesium 500 MG TABS Take 500 mg by mouth in the morning, at noon, and at bedtime.     . Melatonin 10 MG TABS Take 10 mg by mouth at bedtime.    . ondansetron (ZOFRAN) 8 MG tablet TAKE 1 TABLET BY MOUTH EVERY 8 HOURS AS NEEDED FOR NAUSEA OR VOMITING. 20 tablet 0  . prochlorperazine (COMPAZINE) 10 MG tablet Take 1 tablet (10 mg total) by mouth every 6 (six) hours as needed. (Patient not taking: Reported on 07/07/2020) 30 tablet 2  . temazepam (RESTORIL) 15 MG capsule TAKE 1 CAPSULE BY MOUTH AT BEDTIME AS NEEDED  FOR SLEEP. 30 capsule 0   No current facility-administered medications for this visit.    SURGICAL HISTORY:  Past Surgical History:  Procedure Laterality Date  . BRONCHIAL BIOPSY  02/24/2020   Procedure: BRONCHIAL BIOPSIES;  Surgeon: Collene Gobble, MD;  Location: [facility] ENDOSCOPY;  Service: Pulmonary;;  . BRONCHIAL BRUSHINGS  02/24/2020   Procedure: BRONCHIAL BRUSHINGS;  Surgeon: Collene Gobble, MD;  Location: [facility] ENDOSCOPY;  Service: Pulmonary;;  . BRONCHIAL NEEDLE ASPIRATION BIOPSY  02/24/2020   Procedure: BRONCHIAL NEEDLE ASPIRATION BIOPSIES;  Surgeon: Collene Gobble, MD;  Location: [facility] ENDOSCOPY;  Service: Pulmonary;;  . BRONCHIAL WASHINGS  02/24/2020   Procedure: BRONCHIAL WASHINGS;  Surgeon: Collene Gobble, MD;  Location: [facility] ENDOSCOPY;  Service: Pulmonary;;  . COLONOSCOPY    . INTERCOSTAL NERVE BLOCK Right 04/28/2020   Procedure: INTERCOSTAL NERVE BLOCK;  Surgeon: Lajuana Matte, MD;  Location: [facility];  Service: Thoracic;  Laterality: Right;  . left ankle surgery     fractured  . left knee surgery     MENISCAL REMOVED  . left shoulder     torn ligaments left shoulder  . LUMBAR FUSION     L3 L4 L5  . NODE DISSECTION Right 04/28/2020   Procedure: NODE DISSECTION;  Surgeon: Lajuana Matte, MD;  Location: [facility];  Service: Thoracic;  Laterality: Right;  . RIGHT CARPAL TUNNEL RELEASE  2011  . RIGHT KNEE ARTHROSCOPY   AUG OR SEPT 2015   AT [facility]  . ROOT CANAL    . THORACOSCOPY Right 04/28/2020   XI ROBOTIC ASSISTED THORASCOPY-LOBECTOMY (Right)  . TONSILLECTOMY     as child  . 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 TOTAL KNEE ARTHROPLASTY Right 07/21/2014   Procedure: RIGHT TOTAL KNEE ARTHROPLASTY;  Surgeon: Mauri Pole, MD;  Location: [facility];  Service: Orthopedics;  Laterality: Right;  . TOTAL SHOULDER ARTHROPLASTY  Right 10/17/2019   Procedure: RIGHT REVERSE TOTAL SHOULDER REPLACEMENT;  Surgeon: Netta Cedars, MD;  Location: [facility];  Service: Orthopedics;  Laterality: Right;  interscalene block  . TRIGGER FINGER RELEASE Left   . VIDEO BRONCHOSCOPY WITH ENDOBRONCHIAL NAVIGATION Right 02/24/2020   Procedure: VIDEO BRONCHOSCOPY WITH ENDOBRONCHIAL NAVIGATION;  Surgeon: Collene Gobble, MD;  Location: [facility] ENDOSCOPY;  Service: Pulmonary;  Laterality: Right;  . WEDGE RESECTION Right 04/28/2020   XI ROBOTIC ASSISTED THORASCOPY-RIGHT LOWER LOBE WEDGE RESECTION (Right Chest)  REVIEW OF SYSTEMS:  A comprehensive review of systems was negative except for: Constitutional: positive for fatigue and weight loss Neurological: positive for headaches Behavioral/Psych: positive for sleep disturbance   PHYSICAL EXAMINATION: General appearance: alert, cooperative and no distress Head: Normocephalic, without obvious abnormality, atraumatic Neck: no adenopathy, no JVD, supple, symmetrical, trachea midline and thyroid not enlarged, symmetric, no tenderness/mass/nodules Lymph nodes: Cervical, supraclavicular, and axillary nodes normal. Resp: clear to auscultation bilaterally Back: symmetric, no curvature. ROM normal. No CVA tenderness. Cardio: regular rate and rhythm, S1, S2 normal, no murmur, click, rub or gallop GI: soft, non-tender; bowel sounds normal; no masses,  no organomegaly Extremities: extremities normal, atraumatic, no cyanosis or edema  ECOG PERFORMANCE STATUS: 1 - Symptomatic but completely ambulatory   Blood pressure 117/78, pulse 93, temperature 98.2 F (36.8 C), temperature source Tympanic, resp. rate 18, height 6\' 1"  (1.854 m), weight 206 lb 9.6 oz (93.7 kg), SpO2 99 %.  LABORATORY DATA: Lab Results  Component Value Date   WBC 8.5 08/18/2020   HGB 11.6 (L) 08/18/2020   HCT 34.3 (L) 08/18/2020   MCV 92.5 08/18/2020   PLT 229 08/18/2020      Chemistry      Component Value Date/Time   NA 136  08/09/2020 1008   K 4.4 08/09/2020 1008   CL 102 08/09/2020 1008   CO2 23 08/09/2020 1008   BUN 21 08/09/2020 1008   CREATININE 1.14 08/09/2020 1008      Component Value Date/Time   CALCIUM 10.1 08/09/2020 1008   ALKPHOS 88 08/09/2020 1008   AST 16 08/09/2020 1008   ALT 25 08/09/2020 1008   BILITOT 1.2 08/09/2020 1008       RADIOGRAPHIC STUDIES: No results found.  ASSESSMENT AND PLAN: This is a very pleasant 69 years old white male with a stage IIIa non-small cell lung cancer, mucinous adenocarcinoma status post right lower lobectomy with lymph node dissection in August 2021. The patient is currently undergoing adjuvant systemic chemotherapy with cisplatin and Alimta status post 3 cycles. The patient continues to tolerate this treatment well except for fatigue and occasional nausea. I recommended for him to proceed with cycle #4 today as planned. He will come back for follow-up visit in 1 months for evaluation with repeat CT scan of the chest for restaging of his disease. He was advised to call immediately if he has any other concerning symptoms in the interval. The patient voices understanding of current disease status and treatment options and is in agreement with the current care plan.  All questions were answered. The patient knows to call the clinic with any problems, questions or concerns. We can certainly see the patient much sooner if necessary.  Disclaimer: This note was dictated with voice recognition software. Similar sounding words can inadvertently be transcribed and may not be corrected upon review.

## 2020-08-18 NOTE — Progress Notes (Signed)
Patient discharged in stable condition, ambulatory to lobby.

## 2020-08-23 ENCOUNTER — Telehealth: Payer: Self-pay | Admitting: Internal Medicine

## 2020-08-23 NOTE — Telephone Encounter (Signed)
Attempted PA for praluent via CMM "The patient currently has access to the requested medication and a Prior Authorization is not needed for the patient/medication"

## 2020-08-25 ENCOUNTER — Other Ambulatory Visit: Payer: Self-pay

## 2020-08-25 ENCOUNTER — Inpatient Hospital Stay: Payer: Medicare Other

## 2020-08-25 DIAGNOSIS — Z5111 Encounter for antineoplastic chemotherapy: Secondary | ICD-10-CM | POA: Diagnosis not present

## 2020-08-25 DIAGNOSIS — C3491 Malignant neoplasm of unspecified part of right bronchus or lung: Secondary | ICD-10-CM

## 2020-08-25 LAB — CMP (CANCER CENTER ONLY)
ALT: 26 U/L (ref 0–44)
AST: 17 U/L (ref 15–41)
Albumin: 3.3 g/dL — ABNORMAL LOW (ref 3.5–5.0)
Alkaline Phosphatase: 76 U/L (ref 38–126)
Anion gap: 7 (ref 5–15)
BUN: 26 mg/dL — ABNORMAL HIGH (ref 8–23)
CO2: 27 mmol/L (ref 22–32)
Calcium: 9.5 mg/dL (ref 8.9–10.3)
Chloride: 102 mmol/L (ref 98–111)
Creatinine: 1.07 mg/dL (ref 0.61–1.24)
GFR, Estimated: >60 mL/min (ref >60)
Glucose, Bld: 160 mg/dL — ABNORMAL HIGH (ref 70–99)
Potassium: 4.3 mmol/L (ref 3.5–5.1)
Sodium: 136 mmol/L (ref 135–145)
Total Bilirubin: 0.7 mg/dL (ref 0.3–1.2)
Total Protein: 6.7 g/dL (ref 6.5–8.1)

## 2020-08-25 LAB — CBC WITH DIFFERENTIAL (CANCER CENTER ONLY)
Abs Immature Granulocytes: 0.05 10*3/uL (ref 0.00–0.07)
Basophils Absolute: 0 10*3/uL (ref 0.0–0.1)
Basophils Relative: 0 %
Eosinophils Absolute: 0 10*3/uL (ref 0.0–0.5)
Eosinophils Relative: 0 %
HCT: 34.4 % — ABNORMAL LOW (ref 39.0–52.0)
Hemoglobin: 11.4 g/dL — ABNORMAL LOW (ref 13.0–17.0)
Immature Granulocytes: 1 %
Lymphocytes Relative: 22 %
Lymphs Abs: 1.2 10*3/uL (ref 0.7–4.0)
MCH: 31.2 pg (ref 26.0–34.0)
MCHC: 33.1 g/dL (ref 30.0–36.0)
MCV: 94.2 fL (ref 80.0–100.0)
Monocytes Absolute: 0.7 10*3/uL (ref 0.1–1.0)
Monocytes Relative: 12 %
Neutro Abs: 3.4 10*3/uL (ref 1.7–7.7)
Neutrophils Relative %: 65 %
Platelet Count: 147 10*3/uL — ABNORMAL LOW (ref 150–400)
RBC: 3.65 MIL/uL — ABNORMAL LOW (ref 4.22–5.81)
RDW: 15 % (ref 11.5–15.5)
WBC Count: 5.2 10*3/uL (ref 4.0–10.5)
nRBC: 0 % (ref 0.0–0.2)

## 2020-08-25 LAB — MAGNESIUM: Magnesium: 1.7 mg/dL (ref 1.7–2.4)

## 2020-08-26 ENCOUNTER — Encounter: Payer: Self-pay | Admitting: Internal Medicine

## 2020-08-26 ENCOUNTER — Telehealth: Payer: Self-pay

## 2020-08-26 NOTE — Telephone Encounter (Signed)
Pts original email "Is there any way I could get a blood transfusion before Christmas? The least effort knocks me out. I am dizzy and am afraid of falling. If there is any way a transfusion could happen it might make the next few days bearable. The cat scratched me yesterday and the blood that came out was startlingly black. Is that normal? I don't ask this lightly."   Per Dr. Julien Nordmann, pts Hgb = 11.4 and does not require transfusion. With regard to the color of his blood, it isn't abnormal as it can vary with where, how deeply he was scratched and oxygen content.  I have spoken with the pt and advised as indicated. Pt expressed understanding of this information.

## 2020-08-28 ENCOUNTER — Encounter: Payer: Self-pay | Admitting: Internal Medicine

## 2020-09-01 ENCOUNTER — Other Ambulatory Visit: Payer: Self-pay

## 2020-09-01 ENCOUNTER — Inpatient Hospital Stay: Payer: Medicare Other

## 2020-09-01 DIAGNOSIS — C3491 Malignant neoplasm of unspecified part of right bronchus or lung: Secondary | ICD-10-CM

## 2020-09-01 DIAGNOSIS — Z5111 Encounter for antineoplastic chemotherapy: Secondary | ICD-10-CM | POA: Diagnosis not present

## 2020-09-01 LAB — CBC WITH DIFFERENTIAL (CANCER CENTER ONLY)
Abs Immature Granulocytes: 0.03 10*3/uL (ref 0.00–0.07)
Basophils Absolute: 0 10*3/uL (ref 0.0–0.1)
Basophils Relative: 0 %
Eosinophils Absolute: 0 10*3/uL (ref 0.0–0.5)
Eosinophils Relative: 0 %
HCT: 32.9 % — ABNORMAL LOW (ref 39.0–52.0)
Hemoglobin: 10.7 g/dL — ABNORMAL LOW (ref 13.0–17.0)
Immature Granulocytes: 0 %
Lymphocytes Relative: 16 %
Lymphs Abs: 1.1 10*3/uL (ref 0.7–4.0)
MCH: 31 pg (ref 26.0–34.0)
MCHC: 32.5 g/dL (ref 30.0–36.0)
MCV: 95.4 fL (ref 80.0–100.0)
Monocytes Absolute: 0.7 10*3/uL (ref 0.1–1.0)
Monocytes Relative: 10 %
Neutro Abs: 5.2 10*3/uL (ref 1.7–7.7)
Neutrophils Relative %: 74 %
Platelet Count: 126 10*3/uL — ABNORMAL LOW (ref 150–400)
RBC: 3.45 MIL/uL — ABNORMAL LOW (ref 4.22–5.81)
RDW: 15.4 % (ref 11.5–15.5)
WBC Count: 7.1 10*3/uL (ref 4.0–10.5)
nRBC: 0 % (ref 0.0–0.2)

## 2020-09-01 LAB — CMP (CANCER CENTER ONLY)
ALT: 20 U/L (ref 0–44)
AST: 18 U/L (ref 15–41)
Albumin: 3.5 g/dL (ref 3.5–5.0)
Alkaline Phosphatase: 84 U/L (ref 38–126)
Anion gap: 10 (ref 5–15)
BUN: 23 mg/dL (ref 8–23)
CO2: 21 mmol/L — ABNORMAL LOW (ref 22–32)
Calcium: 9.4 mg/dL (ref 8.9–10.3)
Chloride: 106 mmol/L (ref 98–111)
Creatinine: 1.02 mg/dL (ref 0.61–1.24)
GFR, Estimated: >60 mL/min (ref >60)
Glucose, Bld: 123 mg/dL — ABNORMAL HIGH (ref 70–99)
Potassium: 4.6 mmol/L (ref 3.5–5.1)
Sodium: 137 mmol/L (ref 135–145)
Total Bilirubin: 0.5 mg/dL (ref 0.3–1.2)
Total Protein: 7.1 g/dL (ref 6.5–8.1)

## 2020-09-01 LAB — MAGNESIUM: Magnesium: 2.1 mg/dL (ref 1.7–2.4)

## 2020-09-02 ENCOUNTER — Telehealth: Payer: Self-pay

## 2020-09-02 NOTE — Telephone Encounter (Signed)
He completed the last dose of his chemotherapy on August 18, 2020.  He can have any dental procedure in 1-2 weeks with no concerning issues.  His CBC yesterday is not concerning for any risk of infection or bleeding issues.  Thank you.

## 2020-09-02 NOTE — Telephone Encounter (Signed)
Please advise regarding the following:   Dr. Julien Nordmann,  [name], [name] [name], DOB: [id number], will likely require extraction of 2 mandibular teeth. This will create 2 dime size wounds.  1. How will the chemo effect his healing? 2. What risks should we be aware of? 3. How elevated is his risk of infection form having an open sore?  Thank you, Greggory Keen, DDS  [facility] [place], [place] [address] [email or web address] [phone] [email or web address]

## 2020-09-05 ENCOUNTER — Other Ambulatory Visit: Payer: Self-pay | Admitting: Physician Assistant

## 2020-09-05 DIAGNOSIS — C3491 Malignant neoplasm of unspecified part of right bronchus or lung: Secondary | ICD-10-CM

## 2020-09-08 ENCOUNTER — Inpatient Hospital Stay: Payer: Medicare Other

## 2020-09-08 ENCOUNTER — Other Ambulatory Visit: Payer: Self-pay

## 2020-09-08 DIAGNOSIS — C3491 Malignant neoplasm of unspecified part of right bronchus or lung: Secondary | ICD-10-CM

## 2020-09-08 DIAGNOSIS — Z5111 Encounter for antineoplastic chemotherapy: Secondary | ICD-10-CM | POA: Diagnosis not present

## 2020-09-08 LAB — CMP (CANCER CENTER ONLY)
ALT: 17 U/L (ref 0–44)
AST: 20 U/L (ref 15–41)
Albumin: 3.6 g/dL (ref 3.5–5.0)
Alkaline Phosphatase: 78 U/L (ref 38–126)
Anion gap: 6 (ref 5–15)
BUN: 22 mg/dL (ref 8–23)
CO2: 26 mmol/L (ref 22–32)
Calcium: 9.2 mg/dL (ref 8.9–10.3)
Chloride: 106 mmol/L (ref 98–111)
Creatinine: 1 mg/dL (ref 0.61–1.24)
GFR, Estimated: >60 mL/min (ref >60)
Glucose, Bld: 141 mg/dL — ABNORMAL HIGH (ref 70–99)
Potassium: 4.4 mmol/L (ref 3.5–5.1)
Sodium: 138 mmol/L (ref 135–145)
Total Bilirubin: 0.3 mg/dL (ref 0.3–1.2)
Total Protein: 7.1 g/dL (ref 6.5–8.1)

## 2020-09-08 LAB — CBC WITH DIFFERENTIAL (CANCER CENTER ONLY)
Abs Immature Granulocytes: 0.01 10*3/uL (ref 0.00–0.07)
Basophils Absolute: 0 10*3/uL (ref 0.0–0.1)
Basophils Relative: 1 %
Eosinophils Absolute: 0.1 10*3/uL (ref 0.0–0.5)
Eosinophils Relative: 2 %
HCT: 33.9 % — ABNORMAL LOW (ref 39.0–52.0)
Hemoglobin: 11.3 g/dL — ABNORMAL LOW (ref 13.0–17.0)
Immature Granulocytes: 0 %
Lymphocytes Relative: 29 %
Lymphs Abs: 1.2 10*3/uL (ref 0.7–4.0)
MCH: 31.8 pg (ref 26.0–34.0)
MCHC: 33.3 g/dL (ref 30.0–36.0)
MCV: 95.5 fL (ref 80.0–100.0)
Monocytes Absolute: 0.5 10*3/uL (ref 0.1–1.0)
Monocytes Relative: 12 %
Neutro Abs: 2.3 10*3/uL (ref 1.7–7.7)
Neutrophils Relative %: 56 %
Platelet Count: 198 10*3/uL (ref 150–400)
RBC: 3.55 MIL/uL — ABNORMAL LOW (ref 4.22–5.81)
RDW: 16 % — ABNORMAL HIGH (ref 11.5–15.5)
WBC Count: 4 10*3/uL (ref 4.0–10.5)
nRBC: 0 % (ref 0.0–0.2)

## 2020-09-08 LAB — MAGNESIUM: Magnesium: 2 mg/dL (ref 1.7–2.4)

## 2020-09-13 ENCOUNTER — Other Ambulatory Visit: Payer: Self-pay | Admitting: Internal Medicine

## 2020-09-17 ENCOUNTER — Other Ambulatory Visit: Payer: Self-pay

## 2020-09-17 ENCOUNTER — Encounter (HOSPITAL_COMMUNITY): Payer: Self-pay

## 2020-09-17 ENCOUNTER — Inpatient Hospital Stay: Payer: Medicare Other | Attending: Family Medicine

## 2020-09-17 ENCOUNTER — Ambulatory Visit (HOSPITAL_COMMUNITY)
Admission: RE | Admit: 2020-09-17 | Discharge: 2020-09-17 | Disposition: A | Discharge status: Home / Self Care | Payer: Medicare Other | Source: Ambulatory Visit | Attending: Internal Medicine | Admitting: Internal Medicine

## 2020-09-17 DIAGNOSIS — Z902 Acquired absence of lung [part of]: Secondary | ICD-10-CM | POA: Insufficient documentation

## 2020-09-17 DIAGNOSIS — C3490 Malignant neoplasm of unspecified part of unspecified bronchus or lung: Secondary | ICD-10-CM | POA: Diagnosis not present

## 2020-09-17 DIAGNOSIS — R5383 Other fatigue: Secondary | ICD-10-CM | POA: Insufficient documentation

## 2020-09-17 DIAGNOSIS — Z96653 Presence of artificial knee joint, bilateral: Secondary | ICD-10-CM | POA: Insufficient documentation

## 2020-09-17 DIAGNOSIS — Z85118 Personal history of other malignant neoplasm of bronchus and lung: Secondary | ICD-10-CM | POA: Insufficient documentation

## 2020-09-17 DIAGNOSIS — Z9221 Personal history of antineoplastic chemotherapy: Secondary | ICD-10-CM | POA: Insufficient documentation

## 2020-09-17 LAB — CBC WITH DIFFERENTIAL (CANCER CENTER ONLY)
Abs Immature Granulocytes: 0.04 10*3/uL (ref 0.00–0.07)
Basophils Absolute: 0 10*3/uL (ref 0.0–0.1)
Basophils Relative: 1 %
Eosinophils Absolute: 0.1 10*3/uL (ref 0.0–0.5)
Eosinophils Relative: 1 %
HCT: 33.9 % — ABNORMAL LOW (ref 39.0–52.0)
Hemoglobin: 11 g/dL — ABNORMAL LOW (ref 13.0–17.0)
Immature Granulocytes: 1 %
Lymphocytes Relative: 19 %
Lymphs Abs: 1.4 10*3/uL (ref 0.7–4.0)
MCH: 31.7 pg (ref 26.0–34.0)
MCHC: 32.4 g/dL (ref 30.0–36.0)
MCV: 97.7 fL (ref 80.0–100.0)
Monocytes Absolute: 1 10*3/uL (ref 0.1–1.0)
Monocytes Relative: 14 %
Neutro Abs: 4.6 10*3/uL (ref 1.7–7.7)
Neutrophils Relative %: 64 %
Platelet Count: 199 10*3/uL (ref 150–400)
RBC: 3.47 MIL/uL — ABNORMAL LOW (ref 4.22–5.81)
RDW: 16.1 % — ABNORMAL HIGH (ref 11.5–15.5)
WBC Count: 7.1 10*3/uL (ref 4.0–10.5)
nRBC: 0 % (ref 0.0–0.2)

## 2020-09-17 LAB — CMP (CANCER CENTER ONLY)
ALT: 15 U/L (ref 0–44)
AST: 22 U/L (ref 15–41)
Albumin: 3.8 g/dL (ref 3.5–5.0)
Alkaline Phosphatase: 72 U/L (ref 38–126)
Anion gap: 8 (ref 5–15)
BUN: 25 mg/dL — ABNORMAL HIGH (ref 8–23)
CO2: 23 mmol/L (ref 22–32)
Calcium: 9.3 mg/dL (ref 8.9–10.3)
Chloride: 105 mmol/L (ref 98–111)
Creatinine: 1.26 mg/dL — ABNORMAL HIGH (ref 0.61–1.24)
GFR, Estimated: >60 mL/min (ref >60)
Glucose, Bld: 94 mg/dL (ref 70–99)
Potassium: 4.9 mmol/L (ref 3.5–5.1)
Sodium: 136 mmol/L (ref 135–145)
Total Bilirubin: 0.4 mg/dL (ref 0.3–1.2)
Total Protein: 7 g/dL (ref 6.5–8.1)

## 2020-09-17 MED ORDER — IOHEXOL 300 MG/ML  SOLN
75.0000 mL | Freq: Once | INTRAMUSCULAR | Status: AC | PRN
  Administered 2020-09-17: 75 mL via INTRAVENOUS

## 2020-09-20 ENCOUNTER — Other Ambulatory Visit: Payer: Self-pay

## 2020-09-20 ENCOUNTER — Encounter: Payer: Self-pay | Admitting: Internal Medicine

## 2020-09-20 ENCOUNTER — Inpatient Hospital Stay (HOSPITAL_BASED_OUTPATIENT_CLINIC_OR_DEPARTMENT_OTHER): Payer: Medicare Other | Admitting: Internal Medicine

## 2020-09-20 VITALS — BP 121/75 | HR 101 | Temp 98.8°F | Resp 15 | Ht 73.0 in | Wt 212.5 lb

## 2020-09-20 DIAGNOSIS — C3491 Malignant neoplasm of unspecified part of right bronchus or lung: Secondary | ICD-10-CM

## 2020-09-20 DIAGNOSIS — C3490 Malignant neoplasm of unspecified part of unspecified bronchus or lung: Secondary | ICD-10-CM

## 2020-09-20 DIAGNOSIS — Z5111 Encounter for antineoplastic chemotherapy: Secondary | ICD-10-CM

## 2020-09-20 DIAGNOSIS — Z96653 Presence of artificial knee joint, bilateral: Secondary | ICD-10-CM | POA: Diagnosis not present

## 2020-09-20 DIAGNOSIS — R5383 Other fatigue: Secondary | ICD-10-CM | POA: Diagnosis not present

## 2020-09-20 DIAGNOSIS — Z902 Acquired absence of lung [part of]: Secondary | ICD-10-CM | POA: Diagnosis not present

## 2020-09-20 DIAGNOSIS — Z85118 Personal history of other malignant neoplasm of bronchus and lung: Secondary | ICD-10-CM | POA: Diagnosis present

## 2020-09-20 DIAGNOSIS — Z9221 Personal history of antineoplastic chemotherapy: Secondary | ICD-10-CM | POA: Diagnosis not present

## 2020-09-20 NOTE — Progress Notes (Signed)
[place] [phone]   [phone]  OFFICE PROGRESS NOTE  Gaynelle Arabian, MD 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  PRIOR THERAPY:   1) Status post right lower lobectomy with lymph node dissection under the care of Dr. Kipp Brood on 04/28/2020. 2)  Adjuvant systemic chemotherapy with cisplatin 75 mg/m2 and Alimta 500 mg/mm IV every 3 weeks.  First dose expected on 06/16/2020.  Status post 4 cycles.  CURRENT THERAPY:  Observation.   INTERVAL HISTORY: [name] [name] 70 y.o. male returns to the clinic today for follow-up visit.  The patient is feeling fine today with no concerning complaints except for fatigue.  He tolerated his course of adjuvant systemic chemotherapy with cisplatin and Alimta fairly well except for the fatigue.  He denied having any current chest pain, shortness of breath, cough or hemoptysis.  He denied having any fever or chills.  He has no nausea, vomiting, diarrhea or constipation.  He denied having any headache or visual changes.  He had repeat CT scan of the chest performed recently and he is here for evaluation and discussion of his discuss results.  MEDICAL HISTORY: Past Medical History:  Diagnosis Date  . Arthritis   . Carpal tunnel syndrome    Right  . Cataracts, bilateral    very mild  . Early cataracts, bilateral   . Fatty liver   . Gallstones   . GERD (gastroesophageal reflux disease)   . Headache 2011   HX OF VISUAL MIGRAINES - NO PAIN --MIGRAINES ATTRIBUTED TO PT CUTTING NICOTINE PATCH IN HALF BEFORE APPLYING TO HIS SKIN.    Marland Kitchen Hepatitis 2001   C -treated and no problems since  2001 'cured'  . History of hiatal hernia   . History of kidney stones    found on CT Scan  . HOH (hard of hearing)   . lung ca dx'd 12/06/19   RIGHT  LUNG  . Muscle cramps    hands and legs  . Osteopenia   . Pain    KNEE OA  . Right lower lobe lung mass   . Shortness of breath dyspnea    "SOMETIMES FEELS LIKE I CAN'T CATCH A DEEP BREATH "- ON GOING FOR YEARS - [place] WORK UP 2011 - [place]  . Stroke ([place])    HX POSSIBLE TIA - RT LEG NUMBNESS AND TINGLING FINGERS AND NUMBNESS RT FACE - ALL RESOLVED ; STATES NEUROLOGIST COULD NOT FIND ANY EVIDENCE OF STROKE - THOUGHT PT'S EPISODE MIGHT BE FROM CUTTING NICOTINE PATCH IN HALF AND APPLYING TO SKIN  . Trigger finger    LEFT MIDDLE FINGER  . Wears glasses     ALLERGIES:  is allergic to statins.  MEDICATIONS:  Current Outpatient Medications  Medication Sig Dispense Refill  . acetaminophen (TYLENOL) 500 MG tablet Take 1,000 mg by mouth every 6 (six) hours as needed.    . Alirocumab (PRALUENT) 150 MG/ML SOAJ Inject 1 Dose into the skin every 14 (fourteen) days. (Patient taking differently: Inject 1 Dose into the skin every 14 (fourteen) days. ) 2 pen 11  . clindamycin (CLEOCIN) 150 MG capsule Take 150-300 mg by mouth See admin instructions. 300 mg 1 hour before Dental appt, 150 mg 6 hours after appt    .  cyclobenzaprine (FLEXERIL) 10 MG tablet Take 10 mg by mouth at bedtime.     Marland Kitchen dexamethasone (DECADRON) 4 MG tablet Take ONE tablet TWICE a day the day before, the day of, and the day after chemothearpy 40 tablet 2  . folic acid (FOLVITE) 1 MG tablet TAKE 1 TABLET BY MOUTH EVERY DAY 30 tablet 0  . HYDROcodone-acetaminophen (NORCO/VICODIN) 5-325 MG tablet hydrocodone 5 mg-acetaminophen 325 mg tablet  1-2 tablets, every 4-6 hours, by mouth,  as needed for pain, for 5 days. NOT TO EXCEED 10 TABLETS A DAY    . loratadine (CLARITIN) 10 MG tablet Take 10 mg by mouth daily. AllerClear    . Magnesium 500 MG TABS Take 500 mg by mouth in the morning, at noon, and at bedtime.     . Melatonin 10 MG TABS Take 10 mg by mouth at bedtime.    . ondansetron  (ZOFRAN) 8 MG tablet TAKE 1 TABLET BY MOUTH EVERY 8 HOURS AS NEEDED FOR NAUSEA OR VOMITING. 20 tablet 0  . prochlorperazine (COMPAZINE) 10 MG tablet Take 1 tablet (10 mg total) by mouth every 6 (six) hours as needed. 30 tablet 2  . temazepam (RESTORIL) 15 MG capsule TAKE 1 CAPSULE BY MOUTH AT BEDTIME AS NEEDED FOR SLEEP 30 capsule 0   No current facility-administered medications for this visit.    SURGICAL HISTORY:  Past Surgical History:  Procedure Laterality Date  . BRONCHIAL BIOPSY  02/24/2020   Procedure: BRONCHIAL BIOPSIES;  Surgeon: Collene Gobble, MD;  Location: [facility] ENDOSCOPY;  Service: Pulmonary;;  . BRONCHIAL BRUSHINGS  02/24/2020   Procedure: BRONCHIAL BRUSHINGS;  Surgeon: Collene Gobble, MD;  Location: [facility] ENDOSCOPY;  Service: Pulmonary;;  . BRONCHIAL NEEDLE ASPIRATION BIOPSY  02/24/2020   Procedure: BRONCHIAL NEEDLE ASPIRATION BIOPSIES;  Surgeon: Collene Gobble, MD;  Location: [facility] ENDOSCOPY;  Service: Pulmonary;;  . BRONCHIAL WASHINGS  02/24/2020   Procedure: BRONCHIAL WASHINGS;  Surgeon: Collene Gobble, MD;  Location: [facility] ENDOSCOPY;  Service: Pulmonary;;  . COLONOSCOPY    . INTERCOSTAL NERVE BLOCK Right 04/28/2020   Procedure: INTERCOSTAL NERVE BLOCK;  Surgeon: Lajuana Matte, MD;  Location: [place];  Service: Thoracic;  Laterality: Right;  . left ankle surgery     fractured  . left knee surgery     MENISCAL REMOVED  . left shoulder     torn ligaments left shoulder  . LUMBAR FUSION     L3 L4 L5  . NODE DISSECTION Right 04/28/2020   Procedure: NODE DISSECTION;  Surgeon: Lajuana Matte, MD;  Location: [place];  Service: Thoracic;  Laterality: Right;  . RIGHT CARPAL TUNNEL RELEASE  2011  . RIGHT KNEE ARTHROSCOPY   AUG OR SEPT 2015   AT [facility]  . ROOT CANAL    . THORACOSCOPY Right 04/28/2020   XI ROBOTIC ASSISTED THORASCOPY-LOBECTOMY (Right)  . TONSILLECTOMY     as child  . TOTAL KNEE ARTHROPLASTY Left 07/14/2013   Procedure: LEFT TOTAL KNEE  ARTHROPLASTY;  Surgeon: Mauri Pole, MD;  Location: [facility];  Service: Orthopedics;  Laterality: Left;  . TOTAL KNEE ARTHROPLASTY Right 07/21/2014   Procedure: RIGHT TOTAL KNEE ARTHROPLASTY;  Surgeon: Mauri Pole, MD;  Location: [facility];  Service: Orthopedics;  Laterality: Right;  . TOTAL SHOULDER ARTHROPLASTY Right 10/17/2019   Procedure: RIGHT REVERSE TOTAL SHOULDER REPLACEMENT;  Surgeon: Netta Cedars, MD;  Location: [facility];  Service: Orthopedics;  Laterality: Right;  interscalene block  . TRIGGER FINGER RELEASE Left   .  VIDEO BRONCHOSCOPY WITH ENDOBRONCHIAL NAVIGATION Right 02/24/2020   Procedure: VIDEO BRONCHOSCOPY WITH ENDOBRONCHIAL NAVIGATION;  Surgeon: Collene Gobble, MD;  Location[facility] ENDOSCOPY;  Service: Pulmonary;  Laterality: Right;  . WEDGE RESECTION Right 04/28/2020   XI ROBOTIC ASSISTED THORASCOPY-RIGHT LOWER LOBE WEDGE RESECTION (Right Chest)     REVIEW OF SYSTEMS:  Constitutional: positive for fatigue Eyes: negative Ears, nose, mouth, throat, and face: negative Respiratory: negative Cardiovascular: negative Gastrointestinal: negative Genitourinary:negative Integument/breast: negative Hematologic/lymphatic: negative Musculoskeletal:negative Neurological: negative Behavioral/Psych: negative Endocrine: negative Allergic/Immunologic: negative   PHYSICAL EXAMINATION: General appearance: alert, cooperative and no distress Head: Normocephalic, without obvious abnormality, atraumatic Neck: no adenopathy, no JVD, supple, symmetrical, trachea midline and thyroid not enlarged, symmetric, no tenderness/mass/nodules Lymph nodes: Cervical, supraclavicular, and axillary nodes normal. Resp: clear to auscultation bilaterally Back: symmetric, no curvature. ROM normal. No CVA tenderness. Cardio: regular rate and rhythm, S1, S2 normal, no murmur, click, rub or gallop GI: soft, non-tender; bowel sounds normal; no masses,  no organomegaly Extremities: extremities normal, atraumatic,  no cyanosis or edema Neurologic: Alert and oriented X 3, normal strength and tone. Normal symmetric reflexes. Normal coordination and gait  ECOG PERFORMANCE STATUS: 1 - Symptomatic but completely ambulatory   Blood pressure 121/75, pulse (!) 101, temperature 98.8 F (37.1 C), temperature source Tympanic, resp. rate 15, height 6\' 1"  (1.854 m), weight 212 lb 8 oz (96.4 kg), SpO2 99 %.  LABORATORY DATA: Lab Results  Component Value Date   WBC 7.1 09/17/2020   HGB 11.0 (L) 09/17/2020   HCT 33.9 (L) 09/17/2020   MCV 97.7 09/17/2020   PLT 199 09/17/2020      Chemistry      Component Value Date/Time   NA 136 09/17/2020 1320   K 4.9 09/17/2020 1320   CL 105 09/17/2020 1320   CO2 23 09/17/2020 1320   BUN 25 (H) 09/17/2020 1320   CREATININE 1.26 (H) 09/17/2020 1320      Component Value Date/Time   CALCIUM 9.3 09/17/2020 1320   ALKPHOS 72 09/17/2020 1320   AST 22 09/17/2020 1320   ALT 15 09/17/2020 1320   BILITOT 0.4 09/17/2020 1320       RADIOGRAPHIC STUDIES: CT Chest W Contrast  Result Date: 09/17/2020 CLINICAL DATA:  Non-small cell lung cancer, for staging EXAM: CT CHEST WITH CONTRAST TECHNIQUE: Multidetector CT imaging of the chest was performed during intravenous contrast administration. CONTRAST:  24mL OMNIPAQUE IOHEXOL 300 MG/ML  SOLN COMPARISON:  PET-CT dated 03/10/2020 FINDINGS: Cardiovascular: The heart is normal in size. No pericardial effusion. No evidence of thoracic aortic aneurysm. Atherosclerotic calcifications of the aortic arch. Three vessel coronary atherosclerosis. Mediastinum/Nodes: No suspicious mediastinal lymphadenopathy. Visualized thyroid is unremarkable. Lungs/Pleura: Status post right lower lobectomy. 6 mm right middle lobe nodule (series 7/image 122), likely corresponding to the prior nodule in the anterior right middle lobe, unchanged. No new/suspicious pulmonary nodules. Mild biapical pleural-parenchymal scarring. Mild subpleural reticulation/fibrosis in  the anterior upper lobes bilaterally. No focal consolidation. No pleural effusion or pneumothorax. Upper Abdomen: Visualized upper abdomen is notable for numerous enhancing splenic lesions during the early arterial phase, measuring up to 13 mm (series 2/image 194). This appearance is nonspecific and considered unlikely to reflect metastatic disease. Layering small gallstones (series 2/image 168), 4.8 cm right upper pole renal sinus cyst (series 2/image 203), and vascular calcifications. Musculoskeletal: Degenerative changes of the visualized thoracolumbar spine. Lumbar spine fixation hardware, incompletely visualized. Right shoulder arthroplasty, incompletely visualized. IMPRESSION: Status post right lower lobectomy. No findings specific for recurrent or  metastatic disease. 6 mm right middle lobe nodule, unchanged. Continued attention on follow-up is suggested. Numerous enhancing splenic lesions on this early arterial study, nonspecific/indeterminate. Differential considerations include heterogeneous enhancement related to early arterial phase (most likely), infiltrative splenic disease including granulomatous etiologies, or (least likely) metastatic disease. Consider MRI abdomen with/without contrast for further evaluation if clinically warranted. Aortic Atherosclerosis (ICD10-I70.0). Electronically Signed   By: Julian Hy M.D.   On: 09/17/2020 16:41    ASSESSMENT AND PLAN: This is a very pleasant 70 years old white male with a stage IIIa non-small cell lung cancer, mucinous adenocarcinoma status post right lower lobectomy with lymph node dissection in August 2021. The patient underwent adjuvant systemic chemotherapy with cisplatin and Alimta status post 4 cycles. The patient tolerated the previous 4 cycles of his treatment well with no concerning adverse effect except for fatigue. He had repeat CT scan of the chest performed recently.  I personally and independently reviewed the scan images and  discussed the results with the patient today. His scan showed no concerning findings for disease recurrence or metastasis but he has numerous enhancing splenic lesions that are nonspecific and need close monitoring. I recommended for the patient to continue on observation with repeat CT scan of the chest in 3 months. The patient was advised to increase his exercise gradually to regain his stamina. He was also advised to call immediately if he has any concerning symptoms in the interval. The patient voices understanding of current disease status and treatment options and is in agreement with the current care plan.  All questions were answered. The patient knows to call the clinic with any problems, questions or concerns. We can certainly see the patient much sooner if necessary.  Disclaimer: This note was dictated with voice recognition software. Similar sounding words can inadvertently be transcribed and may not be corrected upon review.

## 2020-09-23 ENCOUNTER — Telehealth: Payer: Self-pay | Admitting: Internal Medicine

## 2020-09-23 NOTE — Telephone Encounter (Signed)
Scheduled per 1/10 los. Called and spoke with pt, confirmed added appts

## 2020-09-28 ENCOUNTER — Other Ambulatory Visit: Payer: Self-pay | Admitting: Physician Assistant

## 2020-09-28 DIAGNOSIS — C3491 Malignant neoplasm of unspecified part of right bronchus or lung: Secondary | ICD-10-CM

## 2020-10-16 ENCOUNTER — Encounter: Payer: Self-pay | Admitting: Internal Medicine

## 2020-10-20 ENCOUNTER — Other Ambulatory Visit: Payer: Self-pay | Admitting: Physician Assistant

## 2020-10-20 ENCOUNTER — Other Ambulatory Visit (HOSPITAL_COMMUNITY): Payer: Self-pay | Admitting: Family Medicine

## 2020-10-20 DIAGNOSIS — R600 Localized edema: Secondary | ICD-10-CM

## 2020-10-20 DIAGNOSIS — C3491 Malignant neoplasm of unspecified part of right bronchus or lung: Secondary | ICD-10-CM

## 2020-10-21 ENCOUNTER — Ambulatory Visit (HOSPITAL_COMMUNITY)
Admission: RE | Admit: 2020-10-21 | Discharge: 2020-10-21 | Disposition: A | Discharge status: Home / Self Care | Payer: Medicare Other | Source: Ambulatory Visit | Attending: Family Medicine | Admitting: Family Medicine

## 2020-10-21 ENCOUNTER — Other Ambulatory Visit (HOSPITAL_COMMUNITY): Payer: Self-pay | Admitting: Family Medicine

## 2020-10-21 ENCOUNTER — Other Ambulatory Visit: Payer: Self-pay

## 2020-10-21 ENCOUNTER — Telehealth: Payer: Self-pay | Admitting: Medical Oncology

## 2020-10-21 DIAGNOSIS — R600 Localized edema: Secondary | ICD-10-CM

## 2020-10-21 NOTE — Telephone Encounter (Addendum)
Dr Tamala Julian wants pt to f/u with [facility] regarding his ultrasound of  Upper extremity  done today .  Results upper extremity US "Right:  No evidence of deep vein thrombosis in the upper extremity. Findings  consistent  with acute superficial vein thrombosis involving the right cephalic vein.    Left:  No evidence of thrombosis in the subclavian.     *See table(s) above for measurements and observations.    Diagnosing physician: Ruta Hinds MD "

## 2020-10-25 NOTE — Telephone Encounter (Signed)
I will be happy to see him with me next week or Cassie this week. Thank you

## 2020-10-26 ENCOUNTER — Telehealth: Payer: Self-pay | Admitting: Internal Medicine

## 2020-10-26 NOTE — Telephone Encounter (Signed)
Scheduled appt per 2/14 sch msg- pt is aware of appt date and time   

## 2020-10-27 ENCOUNTER — Other Ambulatory Visit: Payer: Self-pay

## 2020-10-27 ENCOUNTER — Inpatient Hospital Stay: Payer: Medicare Other | Attending: Internal Medicine | Admitting: Internal Medicine

## 2020-10-27 VITALS — BP 112/73 | HR 110 | Temp 97.8°F | Resp 14 | Ht 73.0 in | Wt 214.4 lb

## 2020-10-27 DIAGNOSIS — Z9221 Personal history of antineoplastic chemotherapy: Secondary | ICD-10-CM | POA: Diagnosis not present

## 2020-10-27 DIAGNOSIS — Z85118 Personal history of other malignant neoplasm of bronchus and lung: Secondary | ICD-10-CM | POA: Insufficient documentation

## 2020-10-27 DIAGNOSIS — C3491 Malignant neoplasm of unspecified part of right bronchus or lung: Secondary | ICD-10-CM

## 2020-10-27 DIAGNOSIS — I808 Phlebitis and thrombophlebitis of other sites: Secondary | ICD-10-CM | POA: Insufficient documentation

## 2020-10-27 DIAGNOSIS — Z5111 Encounter for antineoplastic chemotherapy: Secondary | ICD-10-CM

## 2020-10-27 NOTE — Progress Notes (Signed)
[place] [phone]   [phone]) [phone]  OFFICE PROGRESS NOTE  Gaynelle Arabian, MD 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  PRIOR THERAPY:   1) Status post right lower lobectomy with lymph node dissection under the care of Dr. Kipp Brood on 04/28/2020. 2)  Adjuvant systemic chemotherapy with cisplatin 75 mg/m2 and Alimta 500 mg/mm IV every 3 weeks.  First dose expected on 06/16/2020.  Status post 4 cycles.  CURRENT THERAPY:  Observation.  INTERVAL HISTORY: [name] [name] 70 y.o. male returns to the clinic today for follow-up visit after he was found on a recent Doppler of the upper extremity to have superficial vein thrombosis involving the right cephalic vein.  The patient mentioned that he had swelling of the right upper extremity that have been after his systemic chemotherapy.  He was seen by his primary care physician and Doppler of the upper extremity showed the above findings.  His primary care physician asked him to follow-up with me for further evaluation.  The patient is currently on aspirin 325 mg p.o. 3 times daily and he also take ibuprofen on as-needed basis.  He denied having any chest pain, shortness of breath, cough or hemoptysis.  He denied having any fever or chills.  He has no nausea, vomiting, diarrhea or constipation.  He has no bleeding issues.  MEDICAL HISTORY: Past Medical History:  Diagnosis Date  . Arthritis   . Carpal tunnel syndrome    Right  . Cataracts, bilateral    very mild  . Early cataracts, bilateral   . Fatty liver   . Gallstones   . GERD (gastroesophageal reflux disease)   . Headache 2011   HX OF VISUAL MIGRAINES - NO PAIN --MIGRAINES ATTRIBUTED TO PT CUTTING NICOTINE PATCH IN HALF BEFORE APPLYING TO HIS SKIN.    Marland Kitchen Hepatitis 2001   C  -treated and no problems since  2001 'cured'  . History of hiatal hernia   . History of kidney stones    found on CT Scan  . HOH (hard of hearing)   . lung ca dx'd 12/06/19   RIGHT LUNG  . Muscle cramps    hands and legs  . Osteopenia   . Pain    KNEE OA  . Right lower lobe lung mass   . Shortness of breath dyspnea    "SOMETIMES FEELS LIKE I CAN'T CATCH A DEEP BREATH "- ON GOING FOR YEARS - [place] WORK UP 2011 - [place]  . Stroke ([place])    HX POSSIBLE TIA - RT LEG NUMBNESS AND TINGLING FINGERS AND NUMBNESS RT FACE - ALL RESOLVED ; STATES NEUROLOGIST COULD NOT FIND ANY EVIDENCE OF STROKE - THOUGHT PT'S EPISODE MIGHT BE FROM CUTTING NICOTINE PATCH IN HALF AND APPLYING TO SKIN  . Trigger finger    LEFT MIDDLE FINGER  . Wears glasses     ALLERGIES:  is allergic to statins.  MEDICATIONS:  Current Outpatient Medications  Medication Sig Dispense Refill  . acetaminophen (TYLENOL) 500 MG tablet Take 1,000 mg by mouth every 6 (six) hours as needed.    . Alirocumab (PRALUENT) 150 MG/ML SOAJ Inject 1 Dose into the skin every 14 (fourteen) days. (Patient taking differently: Inject 1 Dose into the skin every 14 (fourteen) days. )  2 pen 11  . clindamycin (CLEOCIN) 150 MG capsule Take 150-300 mg by mouth See admin instructions. 300 mg 1 hour before Dental appt, 150 mg 6 hours after appt    . cyclobenzaprine (FLEXERIL) 10 MG tablet Take 10 mg by mouth at bedtime.     Marland Kitchen dexamethasone (DECADRON) 4 MG tablet Take ONE tablet TWICE a day the day before, the day of, and the day after chemothearpy 40 tablet 2  . folic acid (FOLVITE) 1 MG tablet TAKE 1 TABLET BY MOUTH EVERY DAY 30 tablet 0  . HYDROcodone-acetaminophen (NORCO/VICODIN) 5-325 MG tablet hydrocodone 5 mg-acetaminophen 325 mg tablet  1-2 tablets, every 4-6 hours, by mouth,  as needed for pain, for 5 days. NOT TO EXCEED 10 TABLETS A DAY    . loratadine (CLARITIN) 10 MG tablet Take 10 mg by mouth daily.  AllerClear    . Magnesium 500 MG TABS Take 500 mg by mouth in the morning, at noon, and at bedtime.     . Melatonin 10 MG TABS Take 10 mg by mouth at bedtime.    . ondansetron (ZOFRAN) 8 MG tablet TAKE 1 TABLET BY MOUTH EVERY 8 HOURS AS NEEDED FOR NAUSEA OR VOMITING. 20 tablet 0  . prochlorperazine (COMPAZINE) 10 MG tablet Take 1 tablet (10 mg total) by mouth every 6 (six) hours as needed. 30 tablet 2  . temazepam (RESTORIL) 15 MG capsule TAKE 1 CAPSULE BY MOUTH AT BEDTIME AS NEEDED FOR SLEEP 30 capsule 0   No current facility-administered medications for this visit.    SURGICAL HISTORY:  Past Surgical History:  Procedure Laterality Date  . BRONCHIAL BIOPSY  02/24/2020   Procedure: BRONCHIAL BIOPSIES;  Surgeon: Collene Gobble, MD;  Location: [facility] ENDOSCOPY;  Service: Pulmonary;;  . BRONCHIAL BRUSHINGS  02/24/2020   Procedure: BRONCHIAL BRUSHINGS;  Surgeon: Collene Gobble, MD;  Location: [facility] ENDOSCOPY;  Service: Pulmonary;;  . BRONCHIAL NEEDLE ASPIRATION BIOPSY  02/24/2020   Procedure: BRONCHIAL NEEDLE ASPIRATION BIOPSIES;  Surgeon: Collene Gobble, MD;  Location: [facility] ENDOSCOPY;  Service: Pulmonary;;  . BRONCHIAL WASHINGS  02/24/2020   Procedure: BRONCHIAL WASHINGS;  Surgeon: Collene Gobble, MD;  Location: [facility] ENDOSCOPY;  Service: Pulmonary;;  . COLONOSCOPY    . INTERCOSTAL NERVE BLOCK Right 04/28/2020   Procedure: INTERCOSTAL NERVE BLOCK;  Surgeon: Lajuana Matte, MD;  Location: [facility];  Service: Thoracic;  Laterality: Right;  . left ankle surgery     fractured  . left knee surgery     MENISCAL REMOVED  . left shoulder     torn ligaments left shoulder  . LUMBAR FUSION     L3 L4 L5  . NODE DISSECTION Right 04/28/2020   Procedure: NODE DISSECTION;  Surgeon: Lajuana Matte, MD;  Location: [place];  Service: Thoracic;  Laterality: Right;  . RIGHT CARPAL TUNNEL RELEASE  2011  . RIGHT KNEE ARTHROSCOPY   AUG OR SEPT 2015   AT [facility]  . ROOT CANAL    . THORACOSCOPY  Right 04/28/2020   XI ROBOTIC ASSISTED THORASCOPY-LOBECTOMY (Right)  . TONSILLECTOMY     as child  . 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 TOTAL KNEE ARTHROPLASTY Right 07/21/2014   Procedure: RIGHT TOTAL KNEE ARTHROPLASTY;  Surgeon: Mauri Pole, MD;  Location: [facility];  Service: Orthopedics;  Laterality: Right;  . TOTAL SHOULDER  ARTHROPLASTY Right 10/17/2019   Procedure: RIGHT REVERSE TOTAL SHOULDER REPLACEMENT;  Surgeon: Netta Cedars, MD;  Location: [facility];  Service: Orthopedics;  Laterality: Right;  interscalene block  . TRIGGER FINGER RELEASE Left   . VIDEO BRONCHOSCOPY WITH ENDOBRONCHIAL NAVIGATION Right 02/24/2020   Procedure: VIDEO BRONCHOSCOPY WITH ENDOBRONCHIAL NAVIGATION;  Surgeon: Collene Gobble, MD;  [facility] END[facility];  Service: Pulmonary;  Laterality: Right;  . WEDGE RESECTION Right 04/28/2020   XI ROBOTIC ASSISTED THORASCOPY-RIGHT LOWER LOBE WEDGE RESECTION (Right Chest)     REVIEW OF SYSTEMS:  A comprehensive review of systems was negative except for: Mild swelling of the right upper extremity   PHYSICAL EXAMINATION: General appearance: alert, cooperative and no distress Head: Normocephalic, without obvious abnormality, atraumatic Neck: no adenopathy, no JVD, supple, symmetrical, trachea midline and thyroid not enlarged, symmetric, no tenderness/mass/nodules Lymph nodes: Cervical, supraclavicular, and axillary nodes normal. Resp: clear to auscultation bilaterally Back: symmetric, no curvature. ROM normal. No CVA tenderness. Cardio: regular rate and rhythm, S1, S2 normal, no murmur, click, rub or gallop GI: soft, non-tender; bowel sounds normal; no masses,  no organomegaly Extremities: edema Mild edema of the right upper extremity.  ECOG PERFORMANCE STATUS: 1 - Symptomatic but completely ambulatory   Blood pressure 112/73, pulse  (!) 110, temperature 97.8 F (36.6 C), temperature source Tympanic, resp. rate 14, height 6\' 1"  (1.854 m), weight 214 lb 6.4 oz (97.3 kg), SpO2 100 %.  LABORATORY DATA: Lab Results  Component Value Date   WBC 7.1 09/17/2020   HGB 11.0 (L) 09/17/2020   HCT 33.9 (L) 09/17/2020   MCV 97.7 09/17/2020   PLT 199 09/17/2020      Chemistry      Component Value Date/Time   NA 136 09/17/2020 1320   K 4.9 09/17/2020 1320   CL 105 09/17/2020 1320   CO2 23 09/17/2020 1320   BUN 25 (H) 09/17/2020 1320   CREATININE 1.26 (H) 09/17/2020 1320      Component Value Date/Time   CALCIUM 9.3 09/17/2020 1320   ALKPHOS 72 09/17/2020 1320   AST 22 09/17/2020 1320   ALT 15 09/17/2020 1320   BILITOT 0.4 09/17/2020 1320       RADIOGRAPHIC STUDIES: VAS [place] UPPER EXTREMITY VENOUS DUPLEX  Result Date: 10/21/2020 UPPER VENOUS STUDY  Indications: Edema Risk Factors: Chemotherapy. Comparison Study: none Performing Technologist: June Leap RDMS, RVT  Examination Guidelines: A complete evaluation includes B-mode imaging, spectral Doppler, color Doppler, and power Doppler as needed of all accessible portions of each vessel. Bilateral testing is considered an integral part of a complete examination. Limited examinations for reoccurring indications may be performed as noted.  Right Findings: +----------+------------+---------+-----------+----------+-----------------+ RIGHT     CompressiblePhasicitySpontaneousProperties     Summary      +----------+------------+---------+-----------+----------+-----------------+ IJV           Full       Yes       Yes                                +----------+------------+---------+-----------+----------+-----------------+ Subclavian    Full       Yes       Yes                                +----------+------------+---------+-----------+----------+-----------------+ Axillary      Full       Yes  Yes                                 +----------+------------+---------+-----------+----------+-----------------+ Brachial      Full       Yes       Yes                                +----------+------------+---------+-----------+----------+-----------------+ Radial        Full                                                    +----------+------------+---------+-----------+----------+-----------------+ Ulnar         Full                                                    +----------+------------+---------+-----------+----------+-----------------+ Cephalic      None                                  isolated at wrist +----------+------------+---------+-----------+----------+-----------------+ Basilic       Full                                                    +----------+------------+---------+-----------+----------+-----------------+  Left Findings: +----------+------------+---------+-----------+----------+-------+ LEFT      CompressiblePhasicitySpontaneousPropertiesSummary +----------+------------+---------+-----------+----------+-------+ Subclavian               Yes       Yes                      +----------+------------+---------+-----------+----------+-------+  Summary:  Right: No evidence of deep vein thrombosis in the upper extremity. Findings consistent with acute superficial vein thrombosis involving the right cephalic vein.  Left: No evidence of thrombosis in the subclavian.  *See table(s) above for measurements and observations.  Diagnosing physician: Ruta Hinds MD Electronically signed by Ruta Hinds MD on 10/21/2020 at 1:30:39 PM.    Final     ASSESSMENT AND PLAN: This is a very pleasant 70 years old white male with a stage IIIa non-small cell lung cancer, mucinous adenocarcinoma status post right lower lobectomy with lymph node dissection in August 2021. The patient underwent adjuvant systemic chemotherapy with cisplatin and Alimta status post 4 cycles. The patient tolerated  the previous 4 cycles of his treatment well with no concerning adverse effect except for fatigue. He is doing fine today but with the swelling of the right upper extremity he was found to have superficial phlebitis of the right cephalic vein which is likely secondary to his previous systemic chemotherapy. I discussed with the patient several options for management of his condition including continuation of his current treatment with aspirin but only once or twice daily in addition to ibuprofen as needed basis.  I also discussed with him short course of treatment with anticoagulation but the patient would like to hold on this for now. I  will see him back as previously scheduled in early April 2022 with repeat CT scan of the chest. The patient was advised if he has any other concerning symptoms or worsening of the swelling of the right upper extremity. The patient voices understanding of current disease status and treatment options and is in agreement with the current care plan.  All questions were answered. The patient knows to call the clinic with any problems, questions or concerns. We can certainly see the patient much sooner if necessary.  Disclaimer: This note was dictated with voice recognition software. Similar sounding words can inadvertently be transcribed and may not be corrected upon review.

## 2020-10-27 NOTE — Addendum Note (Signed)
Addended by: Ardeen Garland on: 10/27/2020 04:30 PM   Modules accepted: Orders

## 2020-10-28 ENCOUNTER — Telehealth: Payer: Self-pay | Admitting: Internal Medicine

## 2020-10-28 NOTE — Telephone Encounter (Signed)
Made not change to pt's schedule based off of 2/16 los.

## 2020-11-09 ENCOUNTER — Encounter: Payer: Self-pay | Admitting: Internal Medicine

## 2020-11-10 ENCOUNTER — Telehealth: Payer: Self-pay

## 2020-11-10 ENCOUNTER — Other Ambulatory Visit: Payer: Self-pay | Admitting: Internal Medicine

## 2020-11-10 MED ORDER — TEMAZEPAM 15 MG PO CAPS: 15.0000 mg | ORAL_CAPSULE | Freq: Every evening | ORAL | 0 refills | Status: DC | PRN

## 2020-11-10 NOTE — Telephone Encounter (Signed)
Pt is requesting a refill of Temazepam.

## 2020-12-06 ENCOUNTER — Encounter: Payer: Self-pay | Admitting: Internal Medicine

## 2020-12-10 ENCOUNTER — Other Ambulatory Visit: Payer: Self-pay | Admitting: Internal Medicine

## 2020-12-17 ENCOUNTER — Ambulatory Visit (HOSPITAL_COMMUNITY)
Admission: RE | Admit: 2020-12-17 | Discharge: 2020-12-17 | Disposition: A | Discharge status: Home / Self Care | Payer: Medicare Other | Source: Ambulatory Visit | Attending: Internal Medicine | Admitting: Internal Medicine

## 2020-12-17 ENCOUNTER — Other Ambulatory Visit: Payer: Self-pay

## 2020-12-17 ENCOUNTER — Inpatient Hospital Stay: Payer: Medicare Other | Attending: Internal Medicine

## 2020-12-17 DIAGNOSIS — C3490 Malignant neoplasm of unspecified part of unspecified bronchus or lung: Secondary | ICD-10-CM | POA: Insufficient documentation

## 2020-12-17 DIAGNOSIS — Z9221 Personal history of antineoplastic chemotherapy: Secondary | ICD-10-CM | POA: Insufficient documentation

## 2020-12-17 DIAGNOSIS — M8580 Other specified disorders of bone density and structure, unspecified site: Secondary | ICD-10-CM | POA: Insufficient documentation

## 2020-12-17 DIAGNOSIS — Z85118 Personal history of other malignant neoplasm of bronchus and lung: Secondary | ICD-10-CM | POA: Insufficient documentation

## 2020-12-17 LAB — CBC WITH DIFFERENTIAL (CANCER CENTER ONLY)
Abs Immature Granulocytes: 0.03 10*3/uL (ref 0.00–0.07)
Basophils Absolute: 0.1 10*3/uL (ref 0.0–0.1)
Basophils Relative: 1 %
Eosinophils Absolute: 0.2 10*3/uL (ref 0.0–0.5)
Eosinophils Relative: 2 %
HCT: 43.4 % (ref 39.0–52.0)
Hemoglobin: 14.6 g/dL (ref 13.0–17.0)
Immature Granulocytes: 0 %
Lymphocytes Relative: 20 %
Lymphs Abs: 1.8 10*3/uL (ref 0.7–4.0)
MCH: 30.8 pg (ref 26.0–34.0)
MCHC: 33.6 g/dL (ref 30.0–36.0)
MCV: 91.6 fL (ref 80.0–100.0)
Monocytes Absolute: 0.6 10*3/uL (ref 0.1–1.0)
Monocytes Relative: 7 %
Neutro Abs: 6 10*3/uL (ref 1.7–7.7)
Neutrophils Relative %: 70 %
Platelet Count: 209 10*3/uL (ref 150–400)
RBC: 4.74 MIL/uL (ref 4.22–5.81)
RDW: 11.8 % (ref 11.5–15.5)
WBC Count: 8.7 10*3/uL (ref 4.0–10.5)
nRBC: 0 % (ref 0.0–0.2)

## 2020-12-17 LAB — CMP (CANCER CENTER ONLY)
ALT: 24 U/L (ref 0–44)
AST: 22 U/L (ref 15–41)
Albumin: 4.2 g/dL (ref 3.5–5.0)
Alkaline Phosphatase: 102 U/L (ref 38–126)
Anion gap: 16 — ABNORMAL HIGH (ref 5–15)
BUN: 21 mg/dL (ref 8–23)
CO2: 19 mmol/L — ABNORMAL LOW (ref 22–32)
Calcium: 8.9 mg/dL (ref 8.9–10.3)
Chloride: 106 mmol/L (ref 98–111)
Creatinine: 1.08 mg/dL (ref 0.61–1.24)
GFR, Estimated: >60 mL/min (ref >60)
Glucose, Bld: 141 mg/dL — ABNORMAL HIGH (ref 70–99)
Potassium: 4.2 mmol/L (ref 3.5–5.1)
Sodium: 141 mmol/L (ref 135–145)
Total Bilirubin: 0.6 mg/dL (ref 0.3–1.2)
Total Protein: 7.2 g/dL (ref 6.5–8.1)

## 2020-12-17 MED ORDER — IOHEXOL 300 MG/ML  SOLN
75.0000 mL | Freq: Once | INTRAMUSCULAR | Status: AC | PRN
  Administered 2020-12-17: 75 mL via INTRAVENOUS

## 2020-12-20 ENCOUNTER — Encounter: Payer: Self-pay | Admitting: Internal Medicine

## 2020-12-20 ENCOUNTER — Telehealth: Payer: Self-pay | Admitting: Internal Medicine

## 2020-12-20 ENCOUNTER — Inpatient Hospital Stay: Payer: Medicare Other | Admitting: Internal Medicine

## 2020-12-20 ENCOUNTER — Inpatient Hospital Stay (HOSPITAL_BASED_OUTPATIENT_CLINIC_OR_DEPARTMENT_OTHER): Payer: Medicare Other | Admitting: Internal Medicine

## 2020-12-20 ENCOUNTER — Other Ambulatory Visit: Payer: Self-pay

## 2020-12-20 DIAGNOSIS — Z9221 Personal history of antineoplastic chemotherapy: Secondary | ICD-10-CM | POA: Diagnosis not present

## 2020-12-20 DIAGNOSIS — M8580 Other specified disorders of bone density and structure, unspecified site: Secondary | ICD-10-CM | POA: Diagnosis not present

## 2020-12-20 DIAGNOSIS — C3490 Malignant neoplasm of unspecified part of unspecified bronchus or lung: Secondary | ICD-10-CM

## 2020-12-20 DIAGNOSIS — Z85118 Personal history of other malignant neoplasm of bronchus and lung: Secondary | ICD-10-CM | POA: Diagnosis present

## 2020-12-20 NOTE — Progress Notes (Signed)
[place] [phone]   Fax:([phone]  OFFICE PROGRESS NOTE  Gaynelle Arabian, MD 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  PRIOR THERAPY:   1) Status post right lower lobectomy with lymph node dissection under the care of Dr. Kipp Brood on 04/28/2020. 2)  Adjuvant systemic chemotherapy with cisplatin 75 mg/m2 and Alimta 500 mg/mm IV every 3 weeks.  First dose expected on 06/16/2020.  Status post 4 cycles.  CURRENT THERAPY:  Observation.  INTERVAL HISTORY: [name] [name] 70 y.o. male returns to the clinic today for follow-up visit.  The patient is feeling fine today with no concerning complaints.  He was doing ice skating earlier today.  He denied having any current chest pain, shortness of breath except with exertion with no cough or hemoptysis.  He denied having any fever or chills.  He has no nausea, vomiting, diarrhea or constipation.  He has no headache or visual changes.  He has lack of sleep because of pain from the left hand carpal tunnel but he is scheduled to see hand surgeon soon.  The patient had repeat CT scan of the chest performed recently and he is here for evaluation and discussion of his scan results.   MEDICAL HISTORY: Past Medical History:  Diagnosis Date  . Arthritis   . Carpal tunnel syndrome    Right  . Cataracts, bilateral    very mild  . Early cataracts, bilateral   . Fatty liver   . Gallstones   . GERD (gastroesophageal reflux disease)   . Headache 2011   HX OF VISUAL MIGRAINES - NO PAIN --MIGRAINES ATTRIBUTED TO PT CUTTING NICOTINE PATCH IN HALF BEFORE APPLYING TO HIS SKIN.    Marland Kitchen Hepatitis 2001   C -treated and no problems since  2001 'cured'  . History of hiatal hernia   . History of kidney stones    found on CT Scan  . HOH (hard of  hearing)   . lung ca dx'd 12/06/19   RIGHT LUNG  . Muscle cramps    hands and legs  . Osteopenia   . Pain    KNEE OA  . Right lower lobe lung mass   . Shortness of breath dyspnea    "SOMETIMES FEELS LIKE I CAN'T CATCH A DEEP BREATH "- ON GOING FOR YEARS - [place] WORK UP 2011 - [place]  . Stroke ([place])    HX POSSIBLE TIA - RT LEG NUMBNESS AND TINGLING FINGERS AND NUMBNESS RT FACE - ALL RESOLVED ; STATES NEUROLOGIST COULD NOT FIND ANY EVIDENCE OF STROKE - THOUGHT PT'S EPISODE MIGHT BE FROM CUTTING NICOTINE PATCH IN HALF AND APPLYING TO SKIN  . Trigger finger    LEFT MIDDLE FINGER  . Wears glasses     ALLERGIES:  is allergic to statins.  MEDICATIONS:  Current Outpatient Medications  Medication Sig Dispense Refill  . acetaminophen (TYLENOL) 500 MG tablet Take 1,000 mg by mouth every 6 (six) hours as needed.    . Alirocumab (PRALUENT) 150 MG/ML SOAJ Inject 1 Dose into the skin every 14 (fourteen) days. (Patient taking differently: Inject 1 Dose into the skin every 14 (fourteen) days. ) 2 pen 11  . clindamycin (CLEOCIN) 150 MG capsule Take 150-300 mg by mouth See admin instructions. [place]  mg 1 hour before Dental appt, 150 mg 6 hours after appt    . cyclobenzaprine (FLEXERIL) 10 MG tablet Take 10 mg by mouth at bedtime.     . folic acid (FOLVITE) 1 MG tablet TAKE 1 TABLET BY MOUTH EVERY DAY 30 tablet 0  . HYDROcodone-acetaminophen (NORCO/VICODIN) 5-325 MG tablet hydrocodone 5 mg-acetaminophen 325 mg tablet  1-2 tablets, every 4-6 hours, by mouth,  as needed for pain, for 5 days. NOT TO EXCEED 10 TABLETS A DAY    . loratadine (CLARITIN) 10 MG tablet Take 10 mg by mouth daily. AllerClear    . Magnesium 500 MG TABS Take 500 mg by mouth in the morning, at noon, and at bedtime.     . ondansetron (ZOFRAN) 8 MG tablet TAKE 1 TABLET BY MOUTH EVERY 8 HOURS AS NEEDED FOR NAUSEA OR VOMITING. 20 tablet 0  . temazepam (RESTORIL) 15 MG capsule Take 1 capsule (15  mg total) by mouth at bedtime as needed for sleep. 30 capsule 0   No current facility-administered medications for this visit.    SURGICAL HISTORY:  Past Surgical History:  Procedure Laterality Date  . BRONCHIAL BIOPSY  02/24/2020   Procedure: BRONCHIAL BIOPSIES;  Surgeon: Collene Gobble, MD;  Location: [facility] ENDOSCOPY;  Service: Pulmonary;;  . BRONCHIAL BRUSHINGS  02/24/2020   Procedure: BRONCHIAL BRUSHINGS;  Surgeon: Collene Gobble, MD;  Location[facility] ENDOSCOPY;  Service: Pulmonary;;  . BRONCHIAL NEEDLE ASPIRATION BIOPSY  02/24/2020   Procedure: BRONCHIAL NEEDLE ASPIRATION BIOPSIES;  Surgeon: Collene Gobble, MD;  Location: [facility] ENDOSCOPY;  Service: Pulmonary;;  . BRONCHIAL WASHINGS  02/24/2020   Procedure: BRONCHIAL WASHINGS;  Surgeon: Collene Gobble, MD;  Location: [facility] ENDOSCOPY;  Service: Pulmonary;;  . COLONOSCOPY    . INTERCOSTAL NERVE BLOCK Right 04/28/2020   Procedure: INTERCOSTAL NERVE BLOCK;  Surgeon: Lajuana Matte, MD;  Location: [place];  Service: Thoracic;  Laterality: Right;  . left ankle surgery     fractured  . left knee surgery     MENISCAL REMOVED  . left shoulder     torn ligaments left shoulder  . LUMBAR FUSION     L3 L4 L5  . NODE DISSECTION Right 04/28/2020   Procedure: NODE DISSECTION;  Surgeon: Lajuana Matte, MD;  Location: [place];  Service: Thoracic;  Laterality: Right;  . RIGHT CARPAL TUNNEL RELEASE  2011  . RIGHT KNEE ARTHROSCOPY   AUG OR SEPT 2015   AT [facility]  . ROOT CANAL    . THORACOSCOPY Right 04/28/2020   XI ROBOTIC ASSISTED THORASCOPY-LOBECTOMY (Right)  . TONSILLECTOMY     as child  . 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 TOTAL KNEE ARTHROPLASTY Right 07/21/2014   Procedure: RIGHT TOTAL KNEE ARTHROPLASTY;  Surgeon: Mauri Pole, MD;  Location: [facility];  Service: Orthopedics;   Laterality: Right;  . TOTAL SHOULDER ARTHROPLASTY Right 10/17/2019   Procedure: RIGHT REVERSE TOTAL SHOULDER REPLACEMENT;  Surgeon: Netta Cedars, MD;  Location: [facility];  Service: Orthopedics;  Laterality: Right;  interscalene block  . TRIGGER FINGER RELEASE Left   . VIDEO BRONCHOSCOPY WITH ENDOBRONCHIAL NAVIGATION Right 02/24/2020   Procedure: VIDEO BRONCHOSCOPY WITH ENDOBRONCHIAL NAVIGATION;  Surgeon: Collene Gobble, MD;  Location: [facility] ENDOSCOPY;  Service: Pulmonary;  Laterality: Right;  . WEDGE RESECTION Right 04/28/2020   XI ROBOTIC ASSISTED THORASCOPY-RIGHT LOWER  LOBE WEDGE RESECTION (Right Chest)     REVIEW OF SYSTEMS:  A comprehensive review of systems was negative except for: Respiratory: positive for dyspnea on exertion   PHYSICAL EXAMINATION: General appearance: alert, cooperative and no distress Head: Normocephalic, without obvious abnormality, atraumatic Neck: no adenopathy, no JVD, supple, symmetrical, trachea midline and thyroid not enlarged, symmetric, no tenderness/mass/nodules Lymph nodes: Cervical, supraclavicular, and axillary nodes normal. Resp: clear to auscultation bilaterally Back: symmetric, no curvature. ROM normal. No CVA tenderness. Cardio: regular rate and rhythm, S1, S2 normal, no murmur, click, rub or gallop GI: soft, non-tender; bowel sounds normal; no masses,  no organomegaly Extremities: extremities normal, atraumatic, no cyanosis or edema  ECOG PERFORMANCE STATUS: 1 - Symptomatic but completely ambulatory   Blood pressure 109/69, pulse 95, temperature 99.4 F (37.4 C), temperature source Tympanic, resp. rate 15, height 6\' 1"  (1.854 m), weight 220 lb 4.8 oz (99.9 kg), SpO2 96 %.  LABORATORY DATA: Lab Results  Component Value Date   WBC 8.7 12/17/2020   HGB 14.6 12/17/2020   HCT 43.4 12/17/2020   MCV 91.6 12/17/2020   PLT 209 12/17/2020      Chemistry      Component Value Date/Time   NA 141 12/17/2020 1009   K 4.2 12/17/2020 1009   CL 106  12/17/2020 1009   CO2 19 (L) 12/17/2020 1009   BUN 21 12/17/2020 1009   CREATININE 1.08 12/17/2020 1009      Component Value Date/Time   CALCIUM 8.9 12/17/2020 1009   ALKPHOS 102 12/17/2020 1009   AST 22 12/17/2020 1009   ALT 24 12/17/2020 1009   BILITOT 0.6 12/17/2020 1009       RADIOGRAPHIC STUDIES: CT Chest W Contrast  Result Date: 12/20/2020 CLINICAL DATA:  Staging non-small cell lung cancer diagnosed 1 year ago. Chemotherapy completed. EXAM: CT CHEST WITH CONTRAST TECHNIQUE: Multidetector CT imaging of the chest was performed during intravenous contrast administration. CONTRAST:  74mL OMNIPAQUE IOHEXOL 300 MG/ML  SOLN COMPARISON:  CT chest 09/17/2020 and PET-CT 03/10/2020. FINDINGS: Cardiovascular: No acute vascular findings. At least moderate atherosclerosis of the aorta, great vessels and coronary arteries. There are faint calcifications the aortic valve. The heart size is normal. There is no pericardial effusion. Mediastinum/Nodes: There are no enlarged mediastinal, hilar or axillary lymph nodes.Small mediastinal lymph nodes are stable. The thyroid gland, trachea and esophagus demonstrate no significant findings. Lungs/Pleura: There is no pleural effusion. There are stable postsurgical changes from right lower lobectomy. A 6 mm right middle lobe nodule on image 130/7 is stable. No new or enlarging nodules. Stable mild emphysema and subpleural reticulation. Upper abdomen: The visualized upper abdomen appears stable without acute findings. There are layering gallstones numerous small enhancing splenic lesions. No adrenal mass. Musculoskeletal/Chest wall: There is no chest wall mass or suspicious osseous finding. Previous right shoulder reverse arthroplasty and lumbar fusion noted. IMPRESSION: 1. Stable chest CT post right lower lobe resection. No evidence of local recurrence or metastatic disease. 2. Stable 3 mm right middle lobe nodule from baseline study of 02/12/2020, likely benign. 3.  Multiple small enhancing splenic lesions are grossly stable from the previous enhanced study of 3 months ago. These lesions are inapparent on the older studies performed without contrast, but there was no abnormal metabolic activity in the spleen on prior PET-CT. Continued attention on follow-up recommended. 4. Cholelithiasis. 5. Coronary artery atherosclerosis. Aortic Atherosclerosis (ICD10-I70.0) and Emphysema (ICD10-J43.9). Electronically Signed   By: Richardean Sale M.D.   On: 12/20/2020 08:58  ASSESSMENT AND PLAN: This is a very pleasant 70 years old white male with a stage IIIa non-small cell lung cancer, mucinous adenocarcinoma status post right lower lobectomy with lymph node dissection in August 2021. The patient underwent adjuvant systemic chemotherapy with cisplatin and Alimta status post 4 cycles. The patient tolerated the previous 4 cycles of his treatment well with no concerning adverse effect except for fatigue. The patient is currently on observation and he is feeling fine today with no concerning complaints. He had repeat CT scan of the chest performed recently.  I personally and independently reviewed the scan and discussed the results with the patient today. His scan showed no concerning findings for disease recurrence or metastasis. I recommended for the patient to continue on observation with repeat CT scan of the chest in 6 months. The patient was advised to call immediately if he has any concerning symptoms in the interval. The patient voices understanding of current disease status and treatment options and is in agreement with the current care plan.  All questions were answered. The patient knows to call the clinic with any problems, questions or concerns. We can certainly see the patient much sooner if necessary.  Disclaimer: This note was dictated with voice recognition software. Similar sounding words can inadvertently be transcribed and may not be corrected upon  review.

## 2020-12-20 NOTE — Telephone Encounter (Signed)
Moved today's appointment per 4/11 schedule message. Patient is aware of changes.

## 2020-12-21 ENCOUNTER — Telehealth: Payer: Self-pay | Admitting: Internal Medicine

## 2020-12-21 NOTE — Telephone Encounter (Signed)
Scheduled per los. Called and left msg. Mailed printout  °

## 2021-04-07 ENCOUNTER — Other Ambulatory Visit: Payer: Self-pay | Admitting: Internal Medicine

## 2021-04-14 ENCOUNTER — Encounter: Payer: Self-pay | Admitting: Internal Medicine

## 2021-04-18 ENCOUNTER — Other Ambulatory Visit: Payer: Self-pay | Admitting: *Deleted

## 2021-04-18 DIAGNOSIS — E785 Hyperlipidemia, unspecified: Secondary | ICD-10-CM

## 2021-04-19 ENCOUNTER — Inpatient Hospital Stay: Payer: Medicare Other

## 2021-04-20 ENCOUNTER — Ambulatory Visit (HOSPITAL_COMMUNITY): Payer: Medicare Other

## 2021-04-21 ENCOUNTER — Ambulatory Visit: Payer: Medicare Other | Admitting: Internal Medicine

## 2021-04-22 ENCOUNTER — Encounter (HOSPITAL_COMMUNITY): Payer: Self-pay

## 2021-04-22 ENCOUNTER — Ambulatory Visit (HOSPITAL_COMMUNITY)
Admission: RE | Admit: 2021-04-22 | Discharge: 2021-04-22 | Disposition: A | Discharge status: Home / Self Care | Payer: Medicare Other | Source: Ambulatory Visit | Attending: Internal Medicine | Admitting: Internal Medicine

## 2021-04-22 ENCOUNTER — Other Ambulatory Visit: Payer: Self-pay

## 2021-04-22 ENCOUNTER — Inpatient Hospital Stay: Payer: Medicare Other | Attending: Internal Medicine

## 2021-04-22 DIAGNOSIS — Z9221 Personal history of antineoplastic chemotherapy: Secondary | ICD-10-CM | POA: Insufficient documentation

## 2021-04-22 DIAGNOSIS — C3490 Malignant neoplasm of unspecified part of unspecified bronchus or lung: Secondary | ICD-10-CM

## 2021-04-22 DIAGNOSIS — I7090 Unspecified atherosclerosis: Secondary | ICD-10-CM | POA: Insufficient documentation

## 2021-04-22 DIAGNOSIS — Z85118 Personal history of other malignant neoplasm of bronchus and lung: Secondary | ICD-10-CM | POA: Insufficient documentation

## 2021-04-22 DIAGNOSIS — Z902 Acquired absence of lung [part of]: Secondary | ICD-10-CM | POA: Diagnosis not present

## 2021-04-22 LAB — CBC WITH DIFFERENTIAL (CANCER CENTER ONLY)
Abs Immature Granulocytes: 0.02 10*3/uL (ref 0.00–0.07)
Basophils Absolute: 0 10*3/uL (ref 0.0–0.1)
Basophils Relative: 1 %
Eosinophils Absolute: 0.1 10*3/uL (ref 0.0–0.5)
Eosinophils Relative: 1 %
HCT: 44.2 % (ref 39.0–52.0)
Hemoglobin: 15.2 g/dL (ref 13.0–17.0)
Immature Granulocytes: 0 %
Lymphocytes Relative: 15 %
Lymphs Abs: 1.2 10*3/uL (ref 0.7–4.0)
MCH: 32 pg (ref 26.0–34.0)
MCHC: 34.4 g/dL (ref 30.0–36.0)
MCV: 93.1 fL (ref 80.0–100.0)
Monocytes Absolute: 0.6 10*3/uL (ref 0.1–1.0)
Monocytes Relative: 8 %
Neutro Abs: 6 10*3/uL (ref 1.7–7.7)
Neutrophils Relative %: 75 %
Platelet Count: 175 10*3/uL (ref 150–400)
RBC: 4.75 MIL/uL (ref 4.22–5.81)
RDW: 12 % (ref 11.5–15.5)
WBC Count: 7.9 10*3/uL (ref 4.0–10.5)
nRBC: 0 % (ref 0.0–0.2)

## 2021-04-22 LAB — CMP (CANCER CENTER ONLY)
ALT: 24 U/L (ref 0–44)
AST: 20 U/L (ref 15–41)
Albumin: 4.3 g/dL (ref 3.5–5.0)
Alkaline Phosphatase: 92 U/L (ref 38–126)
Anion gap: 9 (ref 5–15)
BUN: 22 mg/dL (ref 8–23)
CO2: 24 mmol/L (ref 22–32)
Calcium: 9.3 mg/dL (ref 8.9–10.3)
Chloride: 105 mmol/L (ref 98–111)
Creatinine: 1.29 mg/dL — ABNORMAL HIGH (ref 0.61–1.24)
GFR, Estimated: >60 mL/min (ref >60)
Glucose, Bld: 91 mg/dL (ref 70–99)
Potassium: 4.6 mmol/L (ref 3.5–5.1)
Sodium: 138 mmol/L (ref 135–145)
Total Bilirubin: 0.7 mg/dL (ref 0.3–1.2)
Total Protein: 7.1 g/dL (ref 6.5–8.1)

## 2021-04-22 MED ORDER — IOHEXOL 350 MG/ML SOLN
60.0000 mL | Freq: Once | INTRAVENOUS | Status: AC | PRN
  Administered 2021-04-22: 60 mL via INTRAVENOUS

## 2021-04-25 ENCOUNTER — Inpatient Hospital Stay (HOSPITAL_BASED_OUTPATIENT_CLINIC_OR_DEPARTMENT_OTHER): Payer: Medicare Other | Admitting: Internal Medicine

## 2021-04-25 ENCOUNTER — Other Ambulatory Visit: Payer: Self-pay

## 2021-04-25 VITALS — BP 114/77 | HR 68 | Temp 99.2°F | Resp 19 | Ht 73.0 in | Wt 220.6 lb

## 2021-04-25 DIAGNOSIS — C3491 Malignant neoplasm of unspecified part of right bronchus or lung: Secondary | ICD-10-CM | POA: Diagnosis not present

## 2021-04-25 DIAGNOSIS — C3490 Malignant neoplasm of unspecified part of unspecified bronchus or lung: Secondary | ICD-10-CM

## 2021-04-25 DIAGNOSIS — Z85118 Personal history of other malignant neoplasm of bronchus and lung: Secondary | ICD-10-CM | POA: Diagnosis not present

## 2021-04-25 NOTE — Addendum Note (Signed)
Addended by: Ardeen Garland on: 04/25/2021 02:57 PM   Modules accepted: Orders

## 2021-04-25 NOTE — Progress Notes (Signed)
[place] Telephone:(336) [phone]   Fax:(336) [phone]  OFFICE PROGRESS NOTE  Gaynelle Arabian, MD 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   PRIOR THERAPY:   1) Status post right lower lobectomy with lymph node dissection under the care of Dr. Kipp Brood on 04/28/2020. 2)  Adjuvant systemic chemotherapy with cisplatin 75 mg/m2 and Alimta 500 mg/mm IV every 3 weeks.  First dose expected on 06/16/2020.  Status post 4 cycles.   CURRENT THERAPY:  Observation.  INTERVAL HISTORY: [name] [name] 70 y.o. male returns to the clinic today for 72-month follow-up visit.  The patient is feeling fine today with no concerning complaints except for occasional shortness of breath with exertion.  He denied having any current chest pain, cough or hemoptysis.  He denied having any fever or chills.  He has no nausea, vomiting, diarrhea or constipation.  He has no headache or visual changes.  The patient has no significant weight loss or night sweats.  He had repeat CT scan of the chest performed recently and he is here for evaluation and discussion of his discuss results.   MEDICAL HISTORY: Past Medical History:  Diagnosis Date   Arthritis    Carpal tunnel syndrome    Right   Cataracts, bilateral    very mild   Early cataracts, bilateral    Fatty liver    Gallstones    GERD (gastroesophageal reflux disease)    Headache 2011   HX OF VISUAL MIGRAINES - NO PAIN --MIGRAINES ATTRIBUTED TO PT CUTTING NICOTINE PATCH IN HALF BEFORE APPLYING TO HIS SKIN.     Hepatitis 2001   C -treated and no problems since  2001 'cured'   History of hiatal hernia    History of kidney stones    found on CT Scan   HOH (hard of hearing)    lung ca dx'd 12/06/19   RIGHT LUNG   Muscle cramps    hands and legs   Osteopenia    Pain     KNEE OA   Right lower lobe lung mass    Shortness of breath dyspnea    "SOMETIMES FEELS LIKE I CAN'T CATCH A DEEP BREATH "- ON GOING FOR YEARS - STATES MEDICAL DOCTOR SENT HIM FOR CARDIAC WORK UP 2011 - [place]   Stroke ([place])    HX POSSIBLE TIA - RT LEG NUMBNESS AND TINGLING FINGERS AND NUMBNESS RT FACE - ALL RESOLVED ; STATES NEUROLOGIST COULD NOT FIND ANY EVIDENCE OF STROKE - THOUGHT PT'S EPISODE MIGHT BE FROM CUTTING NICOTINE PATCH IN HALF AND APPLYING TO SKIN   Trigger finger    LEFT MIDDLE FINGER   Wears glasses     ALLERGIES:  is allergic to statins.  MEDICATIONS:  Current Outpatient Medications  Medication Sig Dispense Refill   Alirocumab (PRALUENT) 150 MG/ML SOAJ INJECT 1 DOSE INTO THE SKIN EVERY 14 (FOURTEEN) DAYS. 2 mL 11   clindamycin (CLEOCIN) 150 MG capsule Take 150-300 mg by mouth See admin instructions. 300 mg 1 hour before Dental appt, 150 mg 6 hours after appt     cyclobenzaprine (FLEXERIL) 10 MG tablet Take 10 mg by mouth at bedtime.      HYDROcodone-acetaminophen (NORCO/VICODIN) 5-325 MG tablet hydrocodone 5 mg-acetaminophen 325 mg tablet  1-2 tablets, every 4-6 hours, by mouth,  as needed for pain, for 5 days. NOT TO EXCEED 10 TABLETS A DAY     loratadine (CLARITIN) 10 MG tablet Take 10 mg by mouth daily. AllerClear     Magnesium 500 MG TABS Take 500 mg by mouth in the morning, at noon, and at bedtime.      No current facility-administered medications for this visit.    SURGICAL HISTORY:  Past Surgical History:  Procedure Laterality Date   BRONCHIAL BIOPSY  02/24/2020   Procedure: BRONCHIAL BIOPSIES;  Surgeon: Collene Gobble, MD;  Location: [facility] ENDOSCOPY;  Service: Pulmonary;;   BRONCHIAL BRUSHINGS  02/24/2020   Procedure: BRONCHIAL BRUSHINGS;  Surgeon: Collene Gobble, MD;  Location: [facility] ENDOSCOPY;  Service: Pulmonary;;   BRONCHIAL NEEDLE ASPIRATION BIOPSY  02/24/2020   Procedure: BRONCHIAL NEEDLE ASPIRATION BIOPSIES;  Surgeon: Collene Gobble, MD;   Location: [facility] ENDOSCOPY;  Service: Pulmonary;;   BRONCHIAL WASHINGS  02/24/2020   Procedure: BRONCHIAL WASHINGS;  Surgeon: Collene Gobble, MD;  Location: [place] ENDOSCOPY;  Service: Pulmonary;;   COLONOSCOPY     INTERCOSTAL NERVE BLOCK Right 04/28/2020   Procedure: INTERCOSTAL NERVE BLOCK;  Surgeon: Lajuana Matte, MD;  Location: [place];  Service: Thoracic;  Laterality: Right;   left ankle surgery     fractured   left knee surgery     MENISCAL REMOVED   left shoulder     torn ligaments left shoulder   LUMBAR FUSION     L3 L4 L5   NODE DISSECTION Right 04/28/2020   Procedure: NODE DISSECTION;  Surgeon: Lajuana Matte, MD;  Location: [place];  Service: Thoracic;  Laterality: Right;   RIGHT CARPAL TUNNEL RELEASE  2011   RIGHT KNEE ARTHROSCOPY   AUG OR SEPT 2015   AT [place] Right 04/28/2020   XI ROBOTIC ASSISTED THORASCOPY-LOBECTOMY (Right)   TONSILLECTOMY     as child   TOTAL KNEE ARTHROPLASTY Left 07/14/2013   Procedure: LEFT TOTAL KNEE ARTHROPLASTY;  Surgeon: Mauri Pole, MD;  Location: [facility];  Service: Orthopedics;  Laterality: Left;   TOTAL KNEE ARTHROPLASTY Right 07/21/2014   Procedure: RIGHT TOTAL KNEE ARTHROPLASTY;  Surgeon: Mauri Pole, MD;  Location: [facility];  Service: Orthopedics;  Laterality: Right;   TOTAL SHOULDER ARTHROPLASTY Right 10/17/2019   Procedure: RIGHT REVERSE TOTAL SHOULDER REPLACEMENT;  Surgeon: Netta Cedars, MD;  Location: [facility];  Service: Orthopedics;  Laterality: Right;  interscalene block   TRIGGER FINGER RELEASE Left    VIDEO BRONCHOSCOPY WITH ENDOBRONCHIAL NAVIGATION Right 02/24/2020   Procedure: VIDEO BRONCHOSCOPY WITH ENDOBRONCHIAL NAVIGATION;  Surgeon: Collene Gobble, MD;  Location: [facility] ENDOSCOPY;  Service: Pulmonary;  Laterality: Right;   WEDGE RESECTION Right 04/28/2020   XI ROBOTIC ASSISTED THORASCOPY-RIGHT LOWER LOBE WEDGE RESECTION (Right Chest)     REVIEW OF SYSTEMS:  A comprehensive  review of systems was negative except for: Respiratory: positive for dyspnea on exertion   PHYSICAL EXAMINATION: General appearance: alert, cooperative, and no distress Head: Normocephalic, without obvious abnormality, atraumatic Neck: no adenopathy, no JVD, supple, symmetrical, trachea midline, and thyroid not enlarged, symmetric, no tenderness/mass/nodules Lymph nodes: Cervical, supraclavicular, and axillary nodes normal. Resp: clear to auscultation bilaterally Back: symmetric, no curvature. ROM normal. No CVA tenderness. Cardio: regular rate and rhythm, S1, S2 normal, no murmur, click, rub or gallop GI: soft, non-tender; bowel sounds normal; no masses,  no organomegaly Extremities: extremities normal, atraumatic, no cyanosis or edema  ECOG PERFORMANCE  STATUS: 1 - Symptomatic but completely ambulatory   Blood pressure 114/77, pulse 68, temperature 99.2 F (37.3 C), temperature source Tympanic, resp. rate 19, height 6\' 1"  (1.854 m), weight 220 lb 9.6 oz (100.1 kg), SpO2 96 %.  LABORATORY DATA: Lab Results  Component Value Date   WBC 7.9 04/22/2021   HGB 15.2 04/22/2021   HCT 44.2 04/22/2021   MCV 93.1 04/22/2021   PLT 175 04/22/2021      Chemistry      Component Value Date/Time   NA 138 04/22/2021 1309   K 4.6 04/22/2021 1309   CL 105 04/22/2021 1309   CO2 24 04/22/2021 1309   BUN 22 04/22/2021 1309   CREATININE 1.29 (H) 04/22/2021 1309      Component Value Date/Time   CALCIUM 9.3 04/22/2021 1309   ALKPHOS 92 04/22/2021 1309   AST 20 04/22/2021 1309   ALT 24 04/22/2021 1309   BILITOT 0.7 04/22/2021 1309       RADIOGRAPHIC STUDIES: CT Chest W Contrast  Result Date: 04/24/2021 CLINICAL DATA:  Non-small cell lung cancer, status post right lower lobectomy, chemotherapy complete EXAM: CT CHEST WITH CONTRAST TECHNIQUE: Multidetector CT imaging of the chest was performed during intravenous contrast administration. CONTRAST:  19mL OMNIPAQUE IOHEXOL 350 MG/ML SOLN  COMPARISON:  12/17/2020 FINDINGS: Cardiovascular: Heart is normal in size.  No pericardial effusion. No evidence of thoracic aortic aneurysm. Atherosclerotic calcifications of the aortic arch. Three vessel coronary sclerosis. Mediastinum/Nodes: No suspicious mediastinal lymphadenopathy. Visualized thyroid is unremarkable. Lungs/Pleura: Status post right lower lobectomy. Stable 5 mm right middle lobe nodule (series 7/image 130), unchanged, likely benign. 3 mm calcified granuloma in the posterior right upper lobe (series 7/image 84), benign. No new/suspicious pulmonary nodules. Mild centrilobular and paraseptal emphysematous changes, upper lung predominant. No focal consolidation. No pleural effusion or pneumothorax. Upper Abdomen: Visualized upper abdomen is notable for cholelithiasis, stable enhancing splenic lesions (non FDG avid on prior PET, 4.8 cm right renal cyst (incompletely visualized), and vascular calcifications. Musculoskeletal: Degenerative changes of the visualized thoracolumbar spine. IMPRESSION: Status post right lower lobectomy. No evidence of recurrent or metastatic disease. Aortic Atherosclerosis (ICD10-I70.0) and Emphysema (ICD10-J43.9). Electronically Signed   By: Julian Hy M.D.   On: 04/24/2021 22:33     ASSESSMENT AND PLAN: This is a very pleasant 70 years old white male with a stage IIIa non-small cell lung cancer, mucinous adenocarcinoma status post right lower lobectomy with lymph node dissection in August 2021. The patient underwent adjuvant systemic chemotherapy with cisplatin and Alimta status post 4 cycles. The patient tolerated the previous 4 cycles of his treatment well with no concerning adverse effect except for fatigue. The patient is currently on observation and he is feeling fine today with no concerning complaints except for occasional shortness of breath with exertion. He had repeat CT scan of the chest performed recently.  I personally and independently reviewed  the scans and discussed the results with the patient today. His scan showed no concerning findings for disease recurrence or metastasis. I recommended for him to continue on observation with repeat CT scan of the chest in 6 months. For the atherosclerosis, he will follow-up with his primary care physician and cardiology if needed. The patient voices understanding of current disease status and treatment options and is in agreement with the current care plan.  All questions were answered. The patient knows to call the clinic with any problems, questions or concerns. We can certainly see the patient much sooner if necessary.  Disclaimer:  This note was dictated with voice recognition software. Similar sounding words can inadvertently be transcribed and may not be corrected upon review.

## 2021-06-06 LAB — LIPID PANEL
Chol/HDL Ratio: 3.3 ratio (ref 0.0–5.0)
Cholesterol, Total: 179 mg/dL (ref 100–199)
HDL: 54 mg/dL (ref >39)
LDL Chol Calc (NIH): 112 mg/dL — ABNORMAL HIGH (ref 0–99)
Triglycerides: 66 mg/dL (ref 0–149)
VLDL Cholesterol Cal: 13 mg/dL (ref 5–40)

## 2021-06-13 ENCOUNTER — Ambulatory Visit (INDEPENDENT_AMBULATORY_CARE_PROVIDER_SITE_OTHER): Payer: Medicare Other | Admitting: Internal Medicine

## 2021-06-13 ENCOUNTER — Other Ambulatory Visit: Payer: Self-pay

## 2021-06-13 VITALS — BP 110/80 | HR 54 | Ht 73.0 in | Wt 220.0 lb

## 2021-06-13 DIAGNOSIS — R0602 Shortness of breath: Secondary | ICD-10-CM

## 2021-06-13 DIAGNOSIS — R5383 Other fatigue: Secondary | ICD-10-CM | POA: Diagnosis not present

## 2021-06-13 DIAGNOSIS — I2584 Coronary atherosclerosis due to calcified coronary lesion: Secondary | ICD-10-CM

## 2021-06-13 DIAGNOSIS — E785 Hyperlipidemia, unspecified: Secondary | ICD-10-CM

## 2021-06-13 DIAGNOSIS — I2510 Atherosclerotic heart disease of native coronary artery without angina pectoris: Secondary | ICD-10-CM | POA: Diagnosis not present

## 2021-06-13 NOTE — Progress Notes (Signed)
LIPID CLINIC CONSULT NOTE  Chief Complaint:  Follow-up dyslipidemia, statin intolerance  Primary Care Physician: Gaynelle Arabian, MD  Primary Cardiologist:  None  HPI:  [name] [name] is a 70 y.o. male who is being seen today for the evaluation of dyslipidemia, statin intolerance at the request of Gaynelle Arabian, MD.  This is a pleasant 70 year old male kindly referred for management of dyslipidemia with a history of statin intolerance (this included atorvastatin, ezetimibe, pravastatin, fenofibrate and pitavastatin).  He reports longstanding dyslipidemia and has been tried on both multiple statins, ezetimibe and fenofibrate in the past causing myalgias which were intolerable.  More recently had lipids off of medication and total cholesterol was 267, triglycerides 166, HDL 53 and LDL 183.  Non-HDL cholesterol 214.  There is a strong family history of heart disease. He related that all 5 siblings (3 girls, 2 boys) take cholesterol medicine and both brothers had heart attacks. Father's cholesterol was very high, difficult to keep below 300 (per patient report).   06/11/2020  [name] [name] returns today for follow-up. Unfortunately he is CT calcium score was abnormal showing an elevated score of 1222, 93rd percentile for age and sex matched control. He denies any cardiac symptoms however was found on extracardiac imaging to have a macrolobulated mass in the right lower lobe concerning for primary bronchogenic neoplasm. Subsequently he underwent work-up and this was found to be hypermetabolic. 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 Fortunately no evidence of metastases and he is planning to undergo neoadjuvant therapy. He was able to get through the procedure well without any issues therefore I do not think that he has any significant obstructive coronary disease. He has responded well to Praluent with a marked reduction in cholesterol. Total  is now 143, triglycerides 103, HDL 37 and LDL of 87. For the time being we will continue this. He did get a grant from [employer] and I informed him that it may be that that is no longer active as of next summer. That foundation is not currently providing grants. We will have to seek out other all options possibly next year.  06/13/2021  [name] [name] returns today for follow-up.  Overall he says he is doing well.  He seems to now be in remission from the lung cancer which was picked up incidentally with calcium scoring.  Although he seems to be doing well from that and is well removed from chemotherapy, he reports he has had progressive fatigue and shortness of breath as well as some chest discomfort.  As noted he had a very high calcium score which was 1222.  He did not seem necessarily symptomatic at this time therefore we did not pursue additional testing however now may be having more symptoms that could be coronary.  PMHx:  Past Medical History:  Diagnosis Date   Arthritis    Carpal tunnel syndrome    Right   Cataracts, bilateral    very mild   Early cataracts, bilateral    Fatty liver    Gallstones    GERD (gastroesophageal reflux disease)    Headache 2011   HX OF VISUAL MIGRAINES - NO PAIN --MIGRAINES ATTRIBUTED TO PT CUTTING NICOTINE PATCH IN HALF BEFORE APPLYING TO HIS SKIN.     Hepatitis 2001   C -treated and no problems since  2001 'cured'   History of hiatal hernia    History of kidney stones    found on CT  Scan   HOH (hard of hearing)    lung ca dx'd 12/06/19   RIGHT LUNG   Muscle cramps    hands and legs   Osteopenia    Pain    KNEE OA   Right lower lobe lung mass    Shortness of breath dyspnea    "SOMETIMES FEELS LIKE I CAN'T CATCH A DEEP BREATH "- ON GOING FOR YEARS - STATES MEDICAL DOCTOR SENT HIM FOR CARDIAC WORK UP 2011 - [place]   Stroke ([place])    HX POSSIBLE TIA - RT LEG NUMBNESS AND TINGLING FINGERS AND NUMBNESS RT FACE - ALL RESOLVED ; STATES NEUROLOGIST  COULD NOT FIND ANY EVIDENCE OF STROKE - THOUGHT PT'S EPISODE MIGHT BE FROM CUTTING NICOTINE PATCH IN HALF AND APPLYING TO SKIN   Trigger finger    LEFT MIDDLE FINGER   Wears glasses     Past Surgical History:  Procedure Laterality Date   BRONCHIAL BIOPSY  02/24/2020   Procedure: BRONCHIAL BIOPSIES;  Surgeon: Collene Gobble, MD;  Location: [facility];  Service: Pulmonary;;   BRONCHIAL BRUSHINGS  02/24/2020   Procedure: BRONCHIAL BRUSHINGS;  Surgeon: Collene Gobble, MD;  Location: [facility];  Service: Pulmonary;;   BRONCHIAL NEEDLE ASPIRATION BIOPSY  02/24/2020   Procedure: BRONCHIAL NEEDLE ASPIRATION BIOPSIES;  Surgeon: Collene Gobble, MD;  Location: [facility];  Service: Pulmonary;;   BRONCHIAL WASHINGS  02/24/2020   Procedure: BRONCHIAL WASHINGS;  Surgeon: Collene Gobble, MD;  Location: [facility];  Service: Pulmonary;;   COLONOSCOPY     INTERCOSTAL NERVE BLOCK Right 04/28/2020   Procedure: INTERCOSTAL NERVE BLOCK;  Surgeon: Lajuana Matte, MD;  Location: [facility];  Service: Thoracic;  Laterality: Right;   left ankle surgery     fractured   left knee surgery     MENISCAL REMOVED   left shoulder     torn ligaments left shoulder   LUMBAR FUSION     L3 L4 L5   NODE DISSECTION Right 04/28/2020   Procedure: NODE DISSECTION;  Surgeon: Lajuana Matte, MD;  Location: [facility];  Service: Thoracic;  Laterality: Right;   RIGHT CARPAL TUNNEL RELEASE  2011   RIGHT KNEE ARTHROSCOPY   AUG OR SEPT 2015   AT [place] Right 04/28/2020   XI ROBOTIC ASSISTED THORASCOPY-LOBECTOMY (Right)   TONSILLECTOMY     as child   TOTAL KNEE ARTHROPLASTY Left 07/14/2013   Procedure: LEFT TOTAL KNEE ARTHROPLASTY;  Surgeon: Mauri Pole, MD;  Location: [facility];  Service: Orthopedics;  Laterality: Left;   TOTAL KNEE ARTHROPLASTY Right 07/21/2014   Procedure: RIGHT TOTAL KNEE ARTHROPLASTY;  Surgeon: Mauri Pole, MD;  Location: [facility];  Service:  Orthopedics;  Laterality: Right;   TOTAL SHOULDER ARTHROPLASTY Right 10/17/2019   Procedure: RIGHT REVERSE TOTAL SHOULDER REPLACEMENT;  Surgeon: Netta Cedars, MD;  Location: [facility];  Service: Orthopedics;  Laterality: Right;  interscalene block   TRIGGER FINGER RELEASE Left    VIDEO BRONCHOSCOPY WITH ENDOBRONCHIAL NAVIGATION Right 02/24/2020   Procedure: VIDEO BRONCHOSCOPY WITH ENDOBRONCHIAL NAVIGATION;  Surgeon: Collene Gobble, MD;  Location[facility];  Service: Pulmonary;  Laterality: Right;   WEDGE RESECTION Right 04/28/2020   XI ROBOTIC ASSISTED THORASCOPY-RIGHT LOWER LOBE WEDGE RESECTION (Right Chest)     FAMHx:  Family History  Problem Relation Age of Onset   Alcohol abuse Father    Hyperlipidemia Father    Heart  attack Brother 33   Hyperlipidemia Brother    Hyperlipidemia Sister        3 sisters   Heart attack Brother    Hyperlipidemia Brother     SOCHx:   reports that he quit smoking about 9 years ago. He has never used smokeless tobacco. He reports that he does not currently use alcohol after a past usage of about 7.0 standard drinks per week. He reports that he does not currently use drugs after having used the following drugs: Marijuana.  ALLERGIES:  Allergies  Allergen Reactions   Statins Other (See Comments)    MYALGIAS    ROS: Pertinent items noted in HPI and remainder of comprehensive ROS otherwise negative.  HOME MEDS: Current Outpatient Medications on File Prior to Visit  Medication Sig Dispense Refill   Alirocumab (PRALUENT) 150 MG/ML SOAJ INJECT 1 DOSE INTO THE SKIN EVERY 14 (FOURTEEN) DAYS. 2 mL 11   clindamycin (CLEOCIN) 150 MG capsule Take 150-300 mg by mouth See admin instructions. 300 mg 1 hour before Dental appt, 150 mg 6 hours after appt     cyclobenzaprine (FLEXERIL) 10 MG tablet Take 10 mg by mouth at bedtime.     gabapentin (NEURONTIN) 100 MG capsule Take 100 mg by mouth 3 (three) times daily as needed.     gabapentin (NEURONTIN) 600 MG  tablet Take 600 mg by mouth at bedtime.     loratadine (CLARITIN) 10 MG tablet Take 10 mg by mouth daily. AllerClear     Magnesium 500 MG TABS Take 500 mg by mouth in the morning, at noon, and at bedtime.      naltrexone (DEPADE) 50 MG tablet Take 50 mg by mouth daily.     sertraline (ZOLOFT) 100 MG tablet Take 100 mg by mouth daily.     tamsulosin (FLOMAX) 0.4 MG CAPS capsule Take 0.4 mg by mouth daily.     No current facility-administered medications on file prior to visit.    LABS/IMAGING: No results found for this or any previous visit (from the past 48 hour(s)). No results found.  LIPID PANEL:    Component Value Date/Time   CHOL 179 06/06/2021 1145   TRIG 66 06/06/2021 1145   HDL 54 06/06/2021 1145   CHOLHDL 3.3 06/06/2021 1145   CHOLHDL 5.6 10/31/2007 0830   VLDL 26 10/31/2007 0830   LDLCALC 112 (H) 06/06/2021 1145    WEIGHTS: Wt Readings from Last 3 Encounters:  06/13/21 220 lb (99.8 kg)  04/25/21 220 lb 9.6 oz (100.1 kg)  12/20/20 220 lb 4.8 oz (99.9 kg)    VITALS: BP 110/80   Pulse (!) 54   Ht 6\' 1"  (1.854 m)   Wt 220 lb (99.8 kg)   SpO2 92%   BMI 29.03 kg/m   EXAM: General appearance: alert and no distress Neck: no carotid bruit, no JVD, and thyroid not enlarged, symmetric, no tenderness/mass/nodules Lungs: clear to auscultation bilaterally Heart: regular rate and rhythm, S1, S2 normal, no murmur, click, rub or gallop Abdomen: soft, non-tender; bowel sounds normal; no masses,  no organomegaly Extremities: extremities normal, atraumatic, no cyanosis or edema Pulses: 2+ and symmetric Skin: Skin color, texture, turgor normal. No rashes or lesions Neurologic: Grossly normal Psych: Pleasant  EKG: Deferred  ASSESSMENT: Progressive fatigue, dyspnea on exertion Mixed dyslipidemia with high LDL cholesterol Possible familial hyperlipidemia-Dutch score 4 Strong family history of high cholesterol Statin intolerance, Zetia and fenofibrate  intolerance-myalgias Abnormal CAC score of 1222, 93rd percentile for age and sex matched  control (01/2020) Lung cancer status post wedge resection  PLAN: 1.   Mr. Parkison is apparently in remission from his lung cancer and is well removed from treatments, however has had progressive fatigue and dyspnea on exertion as well as some chest pressure.  He had a very high calcium score which is of course concerning for now possibly symptomatic ischemia.  Would recommend coronary CT angiography to see if we can better understand his symptoms.  He needs continued lipid-lowering.  His last labs however recently showed total cholesterol 179, HDL 54, triglycerides 66 and LDL 112.  This is on Praluent.  No changes to his medicines today.  We will plan to proceed with CT angiography as above.  I suspect he will need additional medications for lipid-lowering.  He may end up being a candidate for one of her upcoming research trials.  He is already participating in a research study with Dr. Lindi Adie.  Follow-up with me in 6 months.  Pixie Casino, MD, [facility], [place] Director of the Advanced Lipid Disorders &  Cardiovascular Risk Reduction Clinic Diplomate of the American Board of Clinical Lipidology Attending Cardiologist  Direct Dial: [phone]  Fax: [phone]  Website:  [email or web address]  Nadean Corwin Livana Yerian 06/13/2021, 1:20 PM

## 2021-06-13 NOTE — Patient Instructions (Addendum)
Medication Instructions:  NO CHANGES *If you need a refill on your cardiac medications before your next appointment, please call your pharmacy*   Lab Work: BMET -- complete prior to CT test  FASTING lipid panel in 6 months  -- complete 1 week before your next visit  If you have labs (blood work) drawn today and your tests are completely normal, you will receive your results only by: [place] (if you have MyChart) OR A paper copy in the mail If you have any lab test that is abnormal or we need to change your treatment, we will call you to review the results.   Testing/Procedures: Cardiac CT at [place]: At [facility], you and your health needs are our priority.  As part of our continuing mission to provide you with exceptional heart care, we have created designated Provider Care Teams.  These Care Teams include your primary Cardiologist (physician) and Advanced Practice Providers (APPs -  Physician Assistants and Nurse Practitioners) who all work together to provide you with the care you need, when you need it.  We recommend signing up for the patient portal called "MyChart".  Sign up information is provided on this After Visit Summary.  MyChart is used to connect with patients for Virtual Visits (Telemedicine).  Patients are able to view lab/test results, encounter notes, upcoming appointments, etc.  Non-urgent messages can be sent to your provider as well.   To learn more about what you can do with MyChart, go to [email or web address].    Your next appointment:   6 month(s)  The format for your next appointment:   In Person  Provider:   Raliegh Ip Mali Hilty, MD   Other Instructions   Your cardiac CT will be scheduled at one of the below locations:   [facility] [address] [place], [place] [address] [phone]  [facility] [address] [place], [place] [address] [phone]  If scheduled at [facility], please arrive at the [facility] main entrance (entrance A) of [facility] 30 minutes prior to test start time. Proceed to the [facility] Radiology Department (first floor) to check-in and test prep.  If scheduled at [facility], please arrive 15 mins early for check-in and test prep.  Please follow these instructions carefully (unless otherwise directed):  Hold all erectile dysfunction medications at least 3 days (72 hrs) prior to test.  On the Night Before the Test: Be sure to Drink plenty of water. Do not consume any caffeinated/decaffeinated beverages or chocolate 12 hours prior to your test. Do not take any antihistamines 12 hours prior to your test.  On the Day of the Test: Drink plenty of water until 1 hour prior to the test. Do not eat any food 4 hours prior to the test. You may take your regular medications prior to the test.  HOLD Furosemide/Hydrochlorothiazide morning of the test.  After the Test: Drink plenty of water. After receiving IV contrast, you may experience a mild flushed feeling. This is normal. On occasion, you may experience a mild rash up to 24 hours after the test. This is not dangerous. If this occurs, you can take Benadryl 25 mg and increase your fluid intake. If you experience trouble breathing, this can be serious. If it is severe call 911 IMMEDIATELY. If it is mild, please call our office. If you take any of these medications: Glipizide/Metformin, Avandament, Glucavance, please  do not take 48 hours after completing test unless otherwise instructed.  Please allow 2-4 weeks for scheduling of routine cardiac CTs. Some insurance companies require a pre-authorization which may delay scheduling of this test.   For non-scheduling related questions, please contact the cardiac imaging nurse navigator should you have any questions/concerns: Marchia Bond, Cardiac Imaging Nurse  Navigator Gordy Clement, Cardiac Imaging Nurse Navigator [facility] Direct Office Dial: [phone]   For scheduling needs, including cancellations and rescheduling, please call [place], [phone].

## 2021-06-15 LAB — LIPID PANEL
Chol/HDL Ratio: 4.2 ratio (ref 0.0–5.0)
Cholesterol, Total: 186 mg/dL (ref 100–199)
HDL: 44 mg/dL (ref >39)
LDL Chol Calc (NIH): 116 mg/dL — ABNORMAL HIGH (ref 0–99)
Triglycerides: 148 mg/dL (ref 0–149)
VLDL Cholesterol Cal: 26 mg/dL (ref 5–40)

## 2021-06-16 ENCOUNTER — Encounter: Payer: Self-pay | Admitting: Internal Medicine

## 2021-06-16 LAB — BASIC METABOLIC PANEL
BUN/Creatinine Ratio: 17 (ref 10–24)
BUN: 21 mg/dL (ref 8–27)
CO2: 20 mmol/L (ref 20–29)
Calcium: 9.7 mg/dL (ref 8.6–10.2)
Chloride: 103 mmol/L (ref 96–106)
Creatinine, Ser: 1.21 mg/dL (ref 0.76–1.27)
Glucose: 106 mg/dL — ABNORMAL HIGH (ref 70–99)
Potassium: 5.2 mmol/L (ref 3.5–5.2)
Sodium: 140 mmol/L (ref 134–144)
eGFR: 65 mL/min/{1.73_m2} (ref >59)

## 2021-06-17 ENCOUNTER — Telehealth (HOSPITAL_COMMUNITY): Payer: Self-pay | Admitting: *Deleted

## 2021-06-17 NOTE — Telephone Encounter (Signed)
Reaching out to patient to offer assistance regarding upcoming cardiac imaging study; pt verbalizes understanding of appt date/time, parking situation and where to check in, pre-test NPO status and verified current allergies; name and call back number provided for further questions should they arise  Gordy Clement RN [facility] [phone] office [phone] cell  Patient reports claustrophobia but has a CT can recently without issues.

## 2021-06-21 ENCOUNTER — Other Ambulatory Visit: Payer: Self-pay | Admitting: Cardiovascular Disease

## 2021-06-21 ENCOUNTER — Ambulatory Visit (HOSPITAL_COMMUNITY)
Admission: RE | Admit: 2021-06-21 | Discharge: 2021-06-21 | Disposition: A | Discharge status: Home / Self Care | Payer: Medicare Other | Source: Ambulatory Visit | Attending: Internal Medicine | Admitting: Internal Medicine

## 2021-06-21 ENCOUNTER — Ambulatory Visit (HOSPITAL_COMMUNITY)
Admission: RE | Admit: 2021-06-21 | Discharge: 2021-06-21 | Disposition: A | Discharge status: Home / Self Care | Payer: Medicare Other | Source: Ambulatory Visit | Attending: Cardiovascular Disease | Admitting: Cardiovascular Disease

## 2021-06-21 ENCOUNTER — Other Ambulatory Visit: Payer: Self-pay

## 2021-06-21 ENCOUNTER — Encounter (HOSPITAL_COMMUNITY): Payer: Self-pay

## 2021-06-21 DIAGNOSIS — E785 Hyperlipidemia, unspecified: Secondary | ICD-10-CM

## 2021-06-21 DIAGNOSIS — R0602 Shortness of breath: Secondary | ICD-10-CM | POA: Diagnosis present

## 2021-06-21 DIAGNOSIS — R931 Abnormal findings on diagnostic imaging of heart and coronary circulation: Secondary | ICD-10-CM | POA: Diagnosis not present

## 2021-06-21 DIAGNOSIS — R5383 Other fatigue: Secondary | ICD-10-CM | POA: Insufficient documentation

## 2021-06-21 DIAGNOSIS — I2584 Coronary atherosclerosis due to calcified coronary lesion: Secondary | ICD-10-CM | POA: Diagnosis present

## 2021-06-21 DIAGNOSIS — I2510 Atherosclerotic heart disease of native coronary artery without angina pectoris: Secondary | ICD-10-CM | POA: Insufficient documentation

## 2021-06-21 MED ORDER — IOHEXOL 350 MG/ML SOLN
95.0000 mL | Freq: Once | INTRAVENOUS | Status: AC | PRN
  Administered 2021-06-21: 95 mL via INTRAVENOUS

## 2021-06-21 MED ORDER — NITROGLYCERIN 0.4 MG SL SUBL
SUBLINGUAL_TABLET | SUBLINGUAL | Status: AC
  Filled 2021-06-21: qty 2

## 2021-06-21 MED ORDER — METOPROLOL TARTRATE 5 MG/5ML IV SOLN
INTRAVENOUS | Status: AC
  Administered 2021-06-21: 5 mg via INTRAVENOUS
  Filled 2021-06-21: qty 20

## 2021-06-21 MED ORDER — NITROGLYCERIN 0.4 MG SL SUBL
0.8000 mg | SUBLINGUAL_TABLET | Freq: Once | SUBLINGUAL | Status: AC
  Administered 2021-06-21: 0.8 mg via SUBLINGUAL

## 2021-06-21 MED ORDER — METOPROLOL TARTRATE 5 MG/5ML IV SOLN
5.0000 mg | INTRAVENOUS | Status: DC | PRN
  Administered 2021-06-21: 5 mg via INTRAVENOUS

## 2021-06-22 ENCOUNTER — Encounter (HOSPITAL_BASED_OUTPATIENT_CLINIC_OR_DEPARTMENT_OTHER): Payer: Self-pay

## 2021-06-22 DIAGNOSIS — I2510 Atherosclerotic heart disease of native coronary artery without angina pectoris: Secondary | ICD-10-CM | POA: Diagnosis not present

## 2021-06-23 NOTE — Telephone Encounter (Signed)
Left message for patient to call back to discuss scheduling appointment for tmr 06/24/21

## 2021-06-23 NOTE — Telephone Encounter (Signed)
FYI

## 2021-06-24 ENCOUNTER — Ambulatory Visit (INDEPENDENT_AMBULATORY_CARE_PROVIDER_SITE_OTHER): Payer: Medicare Other | Admitting: Internal Medicine

## 2021-06-24 ENCOUNTER — Other Ambulatory Visit: Payer: Self-pay

## 2021-06-24 ENCOUNTER — Other Ambulatory Visit: Payer: Self-pay | Admitting: Internal Medicine

## 2021-06-24 ENCOUNTER — Encounter: Payer: Self-pay | Admitting: Internal Medicine

## 2021-06-24 VITALS — BP 122/74 | HR 91 | Ht 73.0 in | Wt 218.0 lb

## 2021-06-24 DIAGNOSIS — I2510 Atherosclerotic heart disease of native coronary artery without angina pectoris: Secondary | ICD-10-CM | POA: Diagnosis not present

## 2021-06-24 DIAGNOSIS — R0602 Shortness of breath: Secondary | ICD-10-CM

## 2021-06-24 DIAGNOSIS — Z01818 Encounter for other preprocedural examination: Secondary | ICD-10-CM

## 2021-06-24 DIAGNOSIS — R931 Abnormal findings on diagnostic imaging of heart and coronary circulation: Secondary | ICD-10-CM | POA: Diagnosis not present

## 2021-06-24 DIAGNOSIS — I2584 Coronary atherosclerosis due to calcified coronary lesion: Secondary | ICD-10-CM

## 2021-06-24 DIAGNOSIS — Z01812 Encounter for preprocedural laboratory examination: Secondary | ICD-10-CM

## 2021-06-24 MED ORDER — NITROGLYCERIN 0.4 MG SL SUBL: 0.4000 mg | SUBLINGUAL_TABLET | SUBLINGUAL | 3 refills | Status: DC | PRN

## 2021-06-24 NOTE — Progress Notes (Signed)
LIPID CLINIC CONSULT NOTE  Chief Complaint:  Follow-up cardiac CT  Primary Care Physician: Gaynelle Arabian, MD  Primary Cardiologist:  None  HPI:  [name] [name] is a 70 y.o. male who is being seen today for the evaluation of dyslipidemia, statin intolerance at the request of Gaynelle Arabian, MD.  This is a pleasant 70 year old male kindly referred for management of dyslipidemia with a history of statin intolerance (this included atorvastatin, ezetimibe, pravastatin, fenofibrate and pitavastatin).  He reports longstanding dyslipidemia and has been tried on both multiple statins, ezetimibe and fenofibrate in the past causing myalgias which were intolerable.  More recently had lipids off of medication and total cholesterol was 267, triglycerides 166, HDL 53 and LDL 183.  Non-HDL cholesterol 214.  There is a strong family history of heart disease. He related that all 5 siblings (3 girls, 2 boys) take cholesterol medicine and both brothers had heart attacks. Father's cholesterol was very high, difficult to keep below 300 (per patient report).   06/11/2020  [name] [name] returns today for follow-up. Unfortunately he is CT calcium score was abnormal showing an elevated score of 1222, 93rd percentile for age and sex matched control. He denies any cardiac symptoms however was found on extracardiac imaging to have a macrolobulated mass in the right lower lobe concerning for primary bronchogenic neoplasm. Subsequently he underwent work-up and this was found to be hypermetabolic. 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 Fortunately no evidence of metastases and he is planning to undergo neoadjuvant therapy. He was able to get through the procedure well without any issues therefore I do not think that he has any significant obstructive coronary disease. He has responded well to Praluent with a marked reduction in cholesterol. Total is now 143,  triglycerides 103, HDL 37 and LDL of 87. For the time being we will continue this. He did get a grant from [employer] and I informed him that it may be that that is no longer active as of next summer. That foundation is not currently providing grants. We will have to seek out other all options possibly next year.  06/13/2021  [name] [name] returns today for follow-up.  Overall he says he is doing well.  He seems to now be in remission from the lung cancer which was picked up incidentally with calcium scoring.  Although he seems to be doing well from that and is well removed from chemotherapy, he reports he has had progressive fatigue and shortness of breath as well as some chest discomfort.  As noted he had a very high calcium score which was 1222.  He did not seem necessarily symptomatic at this time therefore we did not pursue additional testing however now may be having more symptoms that could be coronary.  06/24/2021  [name] [name] is seen today in follow-up.  He recently underwent CT coronary angiography for progressive fatigue and chest pressure.  He told me the other day he played pool for about an hour and a half and was completely exhausted and had chest pressure after it.  He said he took 3 full-strength aspirin with some relief in his symptoms.  Coronary CT showed a high calcium score of 1300, 91st percentile.  He was noted to have subtotal occlusion of the RCA (CAD RADS 5) and disease in the LAD and circumflex.  A small PFO was noted.  He was referred for FFR.  FFR showed positive for reduced flow in the  proximal RCA however the LAD and circumflex were not noted to be hemodynamically significant.  Cardiac catheterization has been recommended.  I shared the results with him today and he is in agreement.  Would like him to start aspirin 81 mg daily and will provide sublingual nitro as needed.  PMHx:  Past Medical History:  Diagnosis Date   Arthritis    Carpal tunnel syndrome    Right   Cataracts,  bilateral    very mild   Early cataracts, bilateral    Fatty liver    Gallstones    GERD (gastroesophageal reflux disease)    Headache 2011   HX OF VISUAL MIGRAINES - NO PAIN --MIGRAINES ATTRIBUTED TO PT CUTTING NICOTINE PATCH IN HALF BEFORE APPLYING TO HIS SKIN.     Hepatitis 2001   C -treated and no problems since  2001 'cured'   History of hiatal hernia    History of kidney stones    found on CT Scan   HOH (hard of hearing)    lung ca dx'd 12/06/19   RIGHT LUNG   Muscle cramps    hands and legs   Osteopenia    Pain    KNEE OA   Right lower lobe lung mass    Shortness of breath dyspnea    "SOMETIMES FEELS LIKE I CAN'T CATCH A DEEP BREATH "- ON GOING FOR YEARS - STATES MEDICAL DOCTOR SENT HIM FOR CARDIAC WORK UP 2011 - [place]   Stroke ([place])    HX POSSIBLE TIA - RT LEG NUMBNESS AND TINGLING FINGERS AND NUMBNESS RT FACE - ALL RESOLVED ; STATES NEUROLOGIST COULD NOT FIND ANY EVIDENCE OF STROKE - THOUGHT PT'S EPISODE MIGHT BE FROM CUTTING NICOTINE PATCH IN HALF AND APPLYING TO SKIN   Trigger finger    LEFT MIDDLE FINGER   Wears glasses     Past Surgical History:  Procedure Laterality Date   BRONCHIAL BIOPSY  02/24/2020   Procedure: BRONCHIAL BIOPSIES;  Surgeon: Collene Gobble, MD;  Location: [facility];  Service: Pulmonary;;   BRONCHIAL BRUSHINGS  02/24/2020   Procedure: BRONCHIAL BRUSHINGS;  Surgeon: Collene Gobble, MD;  Location: [facility] ENDOSCOPY;  Service: Pulmonary;;   BRONCHIAL NEEDLE ASPIRATION BIOPSY  02/24/2020   Procedure: BRONCHIAL NEEDLE ASPIRATION BIOPSIES;  Surgeon: Collene Gobble, MD;  Location: [place] ENDOSCOPY;  Service: Pulmonary;;   BRONCHIAL WASHINGS  02/24/2020   Procedure: BRONCHIAL WASHINGS;  Surgeon: Collene Gobble, MD;  Location: [place] ENDOSCOPY;  Service: Pulmonary;;   COLONOSCOPY     INTERCOSTAL NERVE BLOCK Right 04/28/2020   Procedure: INTERCOSTAL NERVE BLOCK;  Surgeon: Lajuana Matte, MD;  Location: [place];  Service: Thoracic;  Laterality:  Right;   left ankle surgery     fractured   left knee surgery     MENISCAL REMOVED   left shoulder     torn ligaments left shoulder   LUMBAR FUSION     L3 L4 L5   NODE DISSECTION Right 04/28/2020   Procedure: NODE DISSECTION;  Surgeon: Lajuana Matte, MD;  Location: [place];  Service: Thoracic;  Laterality: Right;   RIGHT CARPAL TUNNEL RELEASE  2011   RIGHT KNEE ARTHROSCOPY   AUG OR SEPT 2015   AT [place] Right 04/28/2020   XI ROBOTIC ASSISTED THORASCOPY-LOBECTOMY (Right)   TONSILLECTOMY     as child   TOTAL KNEE ARTHROPLASTY Left 07/14/2013   Procedure: LEFT TOTAL KNEE  ARTHROPLASTY;  Surgeon: Mauri Pole, MD;  Location: [facility];  Service: Orthopedics;  Laterality: Left;   TOTAL KNEE ARTHROPLASTY Right 07/21/2014   Procedure: RIGHT TOTAL KNEE ARTHROPLASTY;  Surgeon: Mauri Pole, MD;  Location: [facility];  Service: Orthopedics;  Laterality: Right;   TOTAL SHOULDER ARTHROPLASTY Right 10/17/2019   Procedure: RIGHT REVERSE TOTAL SHOULDER REPLACEMENT;  Surgeon: Netta Cedars, MD;  Location: [facility];  Service: Orthopedics;  Laterality: Right;  interscalene block   TRIGGER FINGER RELEASE Left    VIDEO BRONCHOSCOPY WITH ENDOBRONCHIAL NAVIGATION Right 02/24/2020   Procedure: VIDEO BRONCHOSCOPY WITH ENDOBRONCHIAL NAVIGATION;  Surgeon: Collene Gobble, MD;  Location: [facility] ENDOSCOPY;  Service: Pulmonary;  Laterality: Right;   WEDGE RESECTION Right 04/28/2020   XI ROBOTIC ASSISTED THORASCOPY-RIGHT LOWER LOBE WEDGE RESECTION (Right Chest)     FAMHx:  Family History  Problem Relation Age of Onset   Alcohol abuse Father    Hyperlipidemia Father    Heart attack Brother 32   Hyperlipidemia Brother    Hyperlipidemia Sister        3 sisters   Heart attack Brother    Hyperlipidemia Brother     SOCHx:   reports that he quit smoking about 9 years ago. His smoking use included cigarettes. He has never used smokeless tobacco. He reports that he does  not currently use alcohol after a past usage of about 7.0 standard drinks per week. He reports that he does not currently use drugs after having used the following drugs: Marijuana.  ALLERGIES:  Allergies  Allergen Reactions   Statins Other (See Comments)    MYALGIAS    ROS: Pertinent items noted in HPI and remainder of comprehensive ROS otherwise negative.  HOME MEDS: Current Outpatient Medications on File Prior to Visit  Medication Sig Dispense Refill   Alirocumab (PRALUENT) 150 MG/ML SOAJ INJECT 1 DOSE INTO THE SKIN EVERY 14 (FOURTEEN) DAYS. 2 mL 11   cyclobenzaprine (FLEXERIL) 10 MG tablet Take 10 mg by mouth at bedtime.     gabapentin (NEURONTIN) 600 MG tablet Take 600 mg by mouth at bedtime.     loratadine (CLARITIN) 10 MG tablet Take 10 mg by mouth daily. AllerClear     sertraline (ZOLOFT) 100 MG tablet Take 100 mg by mouth daily.     tamsulosin (FLOMAX) 0.4 MG CAPS capsule Take 0.4 mg by mouth daily.     clindamycin (CLEOCIN) 150 MG capsule Take 150-300 mg by mouth See admin instructions. 300 mg 1 hour before Dental appt, 150 mg 6 hours after appt (Patient not taking: Reported on 06/24/2021)     gabapentin (NEURONTIN) 100 MG capsule Take 100 mg by mouth 3 (three) times daily as needed. (Patient not taking: Reported on 06/24/2021)     Magnesium 500 MG TABS Take 500 mg by mouth in the morning, at noon, and at bedtime.  (Patient not taking: Reported on 06/24/2021)     naltrexone (DEPADE) 50 MG tablet Take 50 mg by mouth daily. (Patient not taking: Reported on 06/24/2021)     No current facility-administered medications on file prior to visit.    LABS/IMAGING: No results found for this or any previous visit (from the past 48 hour(s)). No results found.  LIPID PANEL:    Component Value Date/Time   CHOL 186 06/14/2021 1245   TRIG 148 06/14/2021 1245   HDL 44 06/14/2021 1245   CHOLHDL 4.2 06/14/2021 1245   CHOLHDL 5.6 10/31/2007 0830   VLDL 26 10/31/2007 0830   LDLCALC  116  (H) 06/14/2021 1245    WEIGHTS: Wt Readings from Last 3 Encounters:  06/24/21 218 lb (98.9 kg)  06/13/21 220 lb (99.8 kg)  04/25/21 220 lb 9.6 oz (100.1 kg)    VITALS: BP 122/74 (BP Location: Left Arm, Patient Position: Sitting, Cuff Size: Large)   Pulse 91   Ht 6\' 1"  (1.854 m)   Wt 218 lb (98.9 kg)   SpO2 95%   BMI 28.76 kg/m   EXAM: Deferred  EKG: Deferred  ASSESSMENT: Progressive fatigue, dyspnea on exertion-abnormal coronary CT angiogram showing calcium score of 1300, 91st percentile, subtotal occlusion of the RCA with significant LAD and circumflex disease, positive FFR in the proximal RCA (06/2021) Mixed dyslipidemia with high LDL cholesterol Possible familial hyperlipidemia-Dutch score 4 Strong family history of high cholesterol Statin intolerance, Zetia and fenofibrate intolerance-myalgias Abnormal CAC score of 1222, 93rd percentile for age and sex matched control (01/2020) Lung cancer status post wedge resection Small PFO-unknown significance  PLAN: 1.   Mr. Stavola continues to be symptomatic with notable fatigue and chest pressure even with minimal exertion.  He was completely exhausted after playing an hour and a half of pool and had some relief after taking high-dose aspirin.  I advised decreasing his aspirin 81 mg daily.  We will provide sublingual nitro and have arranged for cardiac catheterization next week.  I discussed the risk, benefits and alternatives with him and he is agreeable to proceed.  He very well may have multivessel disease which was not identified via FFR however there was significant finding in the proximal RCA.  We have also recently started him on Praluent and he had his first injection this week.  Plan follow-up with me afterwards.  Pixie Casino, MD, [facility], [place] Director of the Advanced Lipid Disorders &  Cardiovascular Risk Reduction Clinic Diplomate of the American Board of Clinical  Lipidology Attending Cardiologist  Direct Dial: [phone]  Fax: [phone]  Website:  [email or web address] 06/24/2021, 3:52 PM

## 2021-06-24 NOTE — H&P (View-Only) (Signed)
LIPID CLINIC CONSULT NOTE  Chief Complaint:  Follow-up cardiac CT  Primary Care Physician: Gaynelle Arabian, MD  Primary Cardiologist:  None  HPI:  [name] [name] is a 70 y.o. male who is being seen today for the evaluation of dyslipidemia, statin intolerance at the request of Gaynelle Arabian, MD.  This is a pleasant 70 year old male kindly referred for management of dyslipidemia with a history of statin intolerance (this included atorvastatin, ezetimibe, pravastatin, fenofibrate and pitavastatin).  He reports longstanding dyslipidemia and has been tried on both multiple statins, ezetimibe and fenofibrate in the past causing myalgias which were intolerable.  More recently had lipids off of medication and total cholesterol was 267, triglycerides 166, HDL 53 and LDL 183.  Non-HDL cholesterol 214.  There is a strong family history of heart disease. He related that all 5 siblings (3 girls, 2 boys) take cholesterol medicine and both brothers had heart attacks. Father's cholesterol was very high, difficult to keep below 300 (per patient report).   06/11/2020  Laurice returns today for follow-up. Unfortunately he is CT calcium score was abnormal showing an elevated score of 1222, 93rd percentile for age and sex matched control. He denies any cardiac symptoms however was found on extracardiac imaging to have a macrolobulated mass in the right lower lobe concerning for primary bronchogenic neoplasm. Subsequently he underwent work-up and this was found to be hypermetabolic. 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 Fortunately no evidence of metastases and he is planning to undergo neoadjuvant therapy. He was able to get through the procedure well without any issues therefore I do not think that he has any significant obstructive coronary disease. He has responded well to Praluent with a marked reduction in cholesterol. Total is now 143,  triglycerides 103, HDL 37 and LDL of 87. For the time being we will continue this. He did get a grant from [employer] and I informed him that it may be that that is no longer active as of next summer. That foundation is not currently providing grants. We will have to seek out other all options possibly next year.  06/13/2021  [name] [name] returns today for follow-up.  Overall he says he is doing well.  He seems to now be in remission from the lung cancer which was picked up incidentally with calcium scoring.  Although he seems to be doing well from that and is well removed from chemotherapy, he reports he has had progressive fatigue and shortness of breath as well as some chest discomfort.  As noted he had a very high calcium score which was 1222.  He did not seem necessarily symptomatic at this time therefore we did not pursue additional testing however now may be having more symptoms that could be coronary.  06/24/2021  [name] [name] is seen today in follow-up.  He recently underwent CT coronary angiography for progressive fatigue and chest pressure.  He told me the other day he played pool for about an hour and a half and was completely exhausted and had chest pressure after it.  He said he took 3 full-strength aspirin with some relief in his symptoms.  Coronary CT showed a high calcium score of 1300, 91st percentile.  He was noted to have subtotal occlusion of the RCA (CAD RADS 5) and disease in the LAD and circumflex.  A small PFO was noted.  He was referred for FFR.  FFR showed positive for reduced flow in the  proximal RCA however the LAD and circumflex were not noted to be hemodynamically significant.  Cardiac catheterization has been recommended.  I shared the results with him today and he is in agreement.  Would like him to start aspirin 81 mg daily and will provide sublingual nitro as needed.  PMHx:  Past Medical History:  Diagnosis Date   Arthritis    Carpal tunnel syndrome    Right   Cataracts,  bilateral    very mild   Early cataracts, bilateral    Fatty liver    Gallstones    GERD (gastroesophageal reflux disease)    Headache 2011   HX OF VISUAL MIGRAINES - NO PAIN --MIGRAINES ATTRIBUTED TO PT CUTTING NICOTINE PATCH IN HALF BEFORE APPLYING TO HIS SKIN.     Hepatitis 2001   C -treated and no problems since  2001 'cured'   History of hiatal hernia    History of kidney stones    found on CT Scan   HOH (hard of hearing)    lung ca dx'd 12/06/19   RIGHT LUNG   Muscle cramps    hands and legs   Osteopenia    Pain    KNEE OA   Right lower lobe lung mass    Shortness of breath dyspnea    "SOMETIMES FEELS LIKE I CAN'T CATCH A DEEP BREATH "- ON GOING FOR YEARS - STATES MEDICAL DOCTOR SENT HIM FOR CARDIAC WORK UP 2011 - [place]   Stroke ([place])    HX POSSIBLE TIA - RT LEG NUMBNESS AND TINGLING FINGERS AND NUMBNESS RT FACE - ALL RESOLVED ; STATES NEUROLOGIST COULD NOT FIND ANY EVIDENCE OF STROKE - THOUGHT PT'S EPISODE MIGHT BE FROM CUTTING NICOTINE PATCH IN HALF AND APPLYING TO SKIN   Trigger finger    LEFT MIDDLE FINGER   Wears glasses     Past Surgical History:  Procedure Laterality Date   BRONCHIAL BIOPSY  02/24/2020   Procedure: BRONCHIAL BIOPSIES;  Surgeon: Collene Gobble, MD;  Location: [place] [facility]OSCOPY;  Service: Pulmonary;;   BRONCHIAL BRUSHINGS  02/24/2020   Procedure: BRONCHIAL BRUSHINGS;  Surgeon: Collene Gobble, MD;  Location: [facility] ENDOSCOPY;  Service: Pulmonary;;   BRONCHIAL NEEDLE ASPIRATION BIOPSY  02/24/2020   Procedure: BRONCHIAL NEEDLE ASPIRATION BIOPSIES;  Surgeon: Collene Gobble, MD;  Location: [place] ENDOSCOPY;  Service: Pulmonary;;   BRONCHIAL WASHINGS  02/24/2020   Procedure: BRONCHIAL WASHINGS;  Surgeon: Collene Gobble, MD;  Location: [place] ENDOSCOPY;  Service: Pulmonary;;   COLONOSCOPY     INTERCOSTAL NERVE BLOCK Right 04/28/2020   Procedure: INTERCOSTAL NERVE BLOCK;  Surgeon: Lajuana Matte, MD;  Location: [place];  Service: Thoracic;  Laterality:  Right;   left ankle surgery     fractured   left knee surgery     MENISCAL REMOVED   left shoulder     torn ligaments left shoulder   LUMBAR FUSION     L3 L4 L5   NODE DISSECTION Right 04/28/2020   Procedure: NODE DISSECTION;  Surgeon: Lajuana Matte, MD;  Location: [place];  Service: Thoracic;  Laterality: Right;   RIGHT CARPAL TUNNEL RELEASE  2011   RIGHT KNEE ARTHROSCOPY   AUG OR SEPT 2015   AT [place] Right 04/28/2020   XI ROBOTIC ASSISTED THORASCOPY-LOBECTOMY (Right)   TONSILLECTOMY     as child   TOTAL KNEE ARTHROPLASTY Left 07/14/2013   Procedure: LEFT TOTAL KNEE  ARTHROPLASTY;  Surgeon: Mauri Pole, MD;  [facility];  Service: Orthopedics;  Laterality: Left;   TOTAL KNEE ARTHROPLASTY Right 07/21/2014   Procedure: RIGHT TOTAL KNEE ARTHROPLASTY;  Surgeon: Mauri Pole, MD;  [facility];  Service: Orthopedics;  Laterality: Right;   TOTAL SHOULDER ARTHROPLASTY Right 10/17/2019   Procedure: RIGHT REVERSE TOTAL SHOULDER REPLACEMENT;  Surgeon: Netta Cedars, MD;  [facility];  Service: Orthopedics;  Laterality: Right;  interscalene block   TRIGGER FINGER RELEASE Left    VIDEO BRONCHOSCOPY WITH ENDOBRONCHIAL NAVIGATION Right 02/24/2020   Procedure: VIDEO BRONCHOSCOPY WITH ENDOBRONCHIAL NAVIGATION;  Surgeon: Collene Gobble, MD;  Location: [facility] ENDOSCOPY;  Service: Pulmonary;  Laterality: Right;   WEDGE RESECTION Right 04/28/2020   XI ROBOTIC ASSISTED THORASCOPY-RIGHT LOWER LOBE WEDGE RESECTION (Right Chest)     FAMHx:  Family History  Problem Relation Age of Onset   Alcohol abuse Father    Hyperlipidemia Father    Heart attack Brother 27   Hyperlipidemia Brother    Hyperlipidemia Sister        3 sisters   Heart attack Brother    Hyperlipidemia Brother     SOCHx:   reports that he quit smoking about 9 years ago. His smoking use included cigarettes. He has never used smokeless tobacco. He reports that he does  not currently use alcohol after a past usage of about 7.0 standard drinks per week. He reports that he does not currently use drugs after having used the following drugs: Marijuana.  ALLERGIES:  Allergies  Allergen Reactions   Statins Other (See Comments)    MYALGIAS    ROS: Pertinent items noted in HPI and remainder of comprehensive ROS otherwise negative.  HOME MEDS: Current Outpatient Medications on File Prior to Visit  Medication Sig Dispense Refill   Alirocumab (PRALUENT) 150 MG/ML SOAJ INJECT 1 DOSE INTO THE SKIN EVERY 14 (FOURTEEN) DAYS. 2 mL 11   cyclobenzaprine (FLEXERIL) 10 MG tablet Take 10 mg by mouth at bedtime.     gabapentin (NEURONTIN) 600 MG tablet Take 600 mg by mouth at bedtime.     loratadine (CLARITIN) 10 MG tablet Take 10 mg by mouth daily. AllerClear     sertraline (ZOLOFT) 100 MG tablet Take 100 mg by mouth daily.     tamsulosin (FLOMAX) 0.4 MG CAPS capsule Take 0.4 mg by mouth daily.     clindamycin (CLEOCIN) 150 MG capsule Take 150-300 mg by mouth See admin instructions. 300 mg 1 hour before Dental appt, 150 mg 6 hours after appt (Patient not taking: Reported on 06/24/2021)     gabapentin (NEURONTIN) 100 MG capsule Take 100 mg by mouth 3 (three) times daily as needed. (Patient not taking: Reported on 06/24/2021)     Magnesium 500 MG TABS Take 500 mg by mouth in the morning, at noon, and at bedtime.  (Patient not taking: Reported on 06/24/2021)     naltrexone (DEPADE) 50 MG tablet Take 50 mg by mouth daily. (Patient not taking: Reported on 06/24/2021)     No current facility-administered medications on file prior to visit.    LABS/IMAGING: No results found for this or any previous visit (from the past 48 hour(s)). No results found.  LIPID PANEL:    Component Value Date/Time   CHOL 186 06/14/2021 1245   TRIG 148 06/14/2021 1245   HDL 44 06/14/2021 1245   CHOLHDL 4.2 06/14/2021 1245   CHOLHDL 5.6 10/31/2007 0830   VLDL 26 10/31/2007 0830   LDLCALC  116  (H) 06/14/2021 1245    WEIGHTS: Wt Readings from Last 3 Encounters:  06/24/21 218 lb (98.9 kg)  06/13/21 220 lb (99.8 kg)  04/25/21 220 lb 9.6 oz (100.1 kg)    VITALS: BP 122/74 (BP Location: Left Arm, Patient Position: Sitting, Cuff Size: Large)   Pulse 91   Ht 6\' 1"  (1.854 m)   Wt 218 lb (98.9 kg)   SpO2 95%   BMI 28.76 kg/m   EXAM: Deferred  EKG: Deferred  ASSESSMENT: Progressive fatigue, dyspnea on exertion-abnormal coronary CT angiogram showing calcium score of 1300, 91st percentile, subtotal occlusion of the RCA with significant LAD and circumflex disease, positive FFR in the proximal RCA (06/2021) Mixed dyslipidemia with high LDL cholesterol Possible familial hyperlipidemia-Dutch score 4 Strong family history of high cholesterol Statin intolerance, Zetia and fenofibrate intolerance-myalgias Abnormal CAC score of 1222, 93rd percentile for age and sex matched control (01/2020) Lung cancer status post wedge resection Small PFO-unknown significance  PLAN: 1.   Mr. Fickle continues to be symptomatic with notable fatigue and chest pressure even with minimal exertion.  He was completely exhausted after playing an hour and a half of pool and had some relief after taking high-dose aspirin.  I advised decreasing his aspirin 81 mg daily.  We will provide sublingual nitro and have arranged for cardiac catheterization next week.  I discussed the risk, benefits and alternatives with him and he is agreeable to proceed.  He very well may have multivessel disease which was not identified via FFR however there was significant finding in the proximal RCA.  We have also recently started him on Praluent and he had his first injection this week.  Plan follow-up with me afterwards.  Pixie Casino, MD, [facility], [place] Director of the Advanced Lipid Disorders &  Cardiovascular Risk Reduction Clinic Diplomate of the American Board of Clinical  Lipidology Attending Cardiologist  Direct Dial: [phone]  Fax: [phone]  Website:  [email or web address] 06/24/2021, 3:52 PM

## 2021-06-24 NOTE — Patient Instructions (Signed)
Medication Instructions:  START aspirin 81mg  daily  Dr. Debara Pickett has prescribed NITROGLYCERIN to use as needed for chest pain   *If you need a refill on your cardiac medications before your next appointment, please call your pharmacy*   Lab Work: BMET & CBC today   If you have labs (blood work) drawn today and your tests are completely normal, you will receive your results only by: [place] (if you have MyChart) OR A paper copy in the mail If you have any lab test that is abnormal or we need to change your treatment, we will call you to review the results.   Testing/Procedures: Cardiac Catheterization at [place]: At [facility], you and your health needs are our priority.  As part of our continuing mission to provide you with exceptional heart care, we have created designated Provider Care Teams.  These Care Teams include your primary Cardiologist (physician) and Advanced Practice Providers (APPs -  Physician Assistants and Nurse Practitioners) who all work together to provide you with the care you need, when you need it.  We recommend signing up for the patient portal called "MyChart".  Sign up information is provided on this After Visit Summary.  MyChart is used to connect with patients for Virtual Visits (Telemedicine).  Patients are able to view lab/test results, encounter notes, upcoming appointments, etc.  Non-urgent messages can be sent to your provider as well.   To learn more about what you can do with MyChart, go to [email or web address].    Your next appointment:   4-5 weeks  The format for your next appointment:   In Person  Provider:   You may see Dr. Debara Pickett or one of the following Advanced Practice Providers on your designated Care Team:   Almyra Deforest, PA-C Fabian Sharp, [place] or  Roby Lofts, [place]   Other Instructions  [place]  [place] [address] Dept: [place]: [phone]  Marvyn Cabiness  [id number]/42/3536  You are scheduled for a Cardiac Catheterization on Wednesday, October 19 with Dr. Ali Lowe.  1. Please arrive at the [facility] (Main Entrance A) at [facility]: [address] [place],  [address] at 6:30 AM (This time is two hours before your procedure to ensure your preparation). Free valet parking service is available.   Special note: Every effort is made to have your procedure done on time. Please understand that emergencies sometimes delay scheduled procedures.  2. Diet: Do not eat solid foods after midnight.  The patient may have clear liquids until 5am upon the day of the procedure.  3. Labs: CBC, BMET today  4. Medication instructions in preparation for your procedure:  On the morning of your procedure, take your Aspirin and any morning medicines NOT listed above.  You may use sips of water.  5. Plan for one night stay--bring personal belongings. 6. Bring a current list of your medications and current insurance cards. 7. You MUST have a responsible person to drive you home. 8. Someone MUST be with you the first 24 hours after you arrive home or your discharge will be delayed. 9. Please wear clothes that are easy to get on and off and wear slip-on shoes.  Thank you for allowing [place] to care for you!   -- [facility]

## 2021-06-25 ENCOUNTER — Encounter: Payer: Self-pay | Admitting: Internal Medicine

## 2021-06-25 LAB — BASIC METABOLIC PANEL
BUN/Creatinine Ratio: 14 (ref 10–24)
BUN: 15 mg/dL (ref 8–27)
CO2: 22 mmol/L (ref 20–29)
Calcium: 9.4 mg/dL (ref 8.6–10.2)
Chloride: 101 mmol/L (ref 96–106)
Creatinine, Ser: 1.08 mg/dL (ref 0.76–1.27)
Glucose: 90 mg/dL (ref 70–99)
Potassium: 4.8 mmol/L (ref 3.5–5.2)
Sodium: 140 mmol/L (ref 134–144)
eGFR: 75 mL/min/{1.73_m2} (ref >59)

## 2021-06-25 LAB — CBC
Hematocrit: 44.5 % (ref 37.5–51.0)
Hemoglobin: 15.5 g/dL (ref 13.0–17.7)
MCH: 31.6 pg (ref 26.6–33.0)
MCHC: 34.8 g/dL (ref 31.5–35.7)
MCV: 91 fL (ref 79–97)
Platelets: 194 10*3/uL (ref 150–450)
RBC: 4.9 x10E6/uL (ref 4.14–5.80)
RDW: 12.3 % (ref 11.6–15.4)
WBC: 8.7 10*3/uL (ref 3.4–10.8)

## 2021-06-27 ENCOUNTER — Other Ambulatory Visit: Payer: Self-pay | Admitting: Internal Medicine

## 2021-06-27 DIAGNOSIS — R931 Abnormal findings on diagnostic imaging of heart and coronary circulation: Secondary | ICD-10-CM

## 2021-06-27 DIAGNOSIS — R0602 Shortness of breath: Secondary | ICD-10-CM

## 2021-06-27 DIAGNOSIS — I2584 Coronary atherosclerosis due to calcified coronary lesion: Secondary | ICD-10-CM

## 2021-06-28 ENCOUNTER — Telehealth: Payer: Self-pay | Admitting: *Deleted

## 2021-06-28 NOTE — Telephone Encounter (Addendum)
Cardiac catheterization scheduled at [facility] for: Wednesday June 29, 2021 8:30 AM [facility] Main Entrance A [facility]) at: 6:30 AM   No solid food after midnight prior to cath, clear liquids until 5 AM day of procedure.   Usual morning medications can be taken pre-cath with sips of water including aspirin 81 mg.    Confirmed patient has responsible adult to drive home post procedure and be with patient first 24 hours after arriving home.  [facility] does allow one visitor to accompany you and wait in the hospital waiting room while you are there for your procedure. You and your visitor will be asked to wear a mask once you enter the hospital.   Patient reports does not currently have any symptoms concerning for COVID-19 and no household members with COVID-19 like illness.   Reviewed procedure/mask/visitor instructions with patient.

## 2021-06-29 ENCOUNTER — Encounter (HOSPITAL_COMMUNITY): Payer: Self-pay | Admitting: Internal Medicine

## 2021-06-29 ENCOUNTER — Other Ambulatory Visit: Payer: Self-pay

## 2021-06-29 ENCOUNTER — Encounter (HOSPITAL_COMMUNITY)
Admission: RE | Disposition: A | Discharge status: Home / Self Care | Payer: Self-pay | Source: Home / Self Care | Attending: Internal Medicine

## 2021-06-29 ENCOUNTER — Ambulatory Visit (HOSPITAL_COMMUNITY)
Admission: RE | Admit: 2021-06-29 | Discharge: 2021-06-29 | Disposition: A | Discharge status: Home / Self Care | Payer: Medicare Other | Attending: Internal Medicine | Admitting: Internal Medicine

## 2021-06-29 DIAGNOSIS — Z79899 Other long term (current) drug therapy: Secondary | ICD-10-CM | POA: Diagnosis not present

## 2021-06-29 DIAGNOSIS — C3490 Malignant neoplasm of unspecified part of unspecified bronchus or lung: Secondary | ICD-10-CM | POA: Diagnosis not present

## 2021-06-29 DIAGNOSIS — R0609 Other forms of dyspnea: Secondary | ICD-10-CM | POA: Diagnosis not present

## 2021-06-29 DIAGNOSIS — R0602 Shortness of breath: Secondary | ICD-10-CM

## 2021-06-29 DIAGNOSIS — Z8249 Family history of ischemic heart disease and other diseases of the circulatory system: Secondary | ICD-10-CM | POA: Diagnosis not present

## 2021-06-29 DIAGNOSIS — E782 Mixed hyperlipidemia: Secondary | ICD-10-CM | POA: Diagnosis not present

## 2021-06-29 DIAGNOSIS — Q2112 Patent foramen ovale: Secondary | ICD-10-CM | POA: Diagnosis not present

## 2021-06-29 DIAGNOSIS — R931 Abnormal findings on diagnostic imaging of heart and coronary circulation: Secondary | ICD-10-CM

## 2021-06-29 DIAGNOSIS — Z87891 Personal history of nicotine dependence: Secondary | ICD-10-CM | POA: Diagnosis not present

## 2021-06-29 DIAGNOSIS — Z888 Allergy status to other drugs, medicaments and biological substances status: Secondary | ICD-10-CM | POA: Diagnosis not present

## 2021-06-29 DIAGNOSIS — I25119 Atherosclerotic heart disease of native coronary artery with unspecified angina pectoris: Secondary | ICD-10-CM | POA: Diagnosis present

## 2021-06-29 DIAGNOSIS — Z7982 Long term (current) use of aspirin: Secondary | ICD-10-CM | POA: Insufficient documentation

## 2021-06-29 DIAGNOSIS — I2582 Chronic total occlusion of coronary artery: Secondary | ICD-10-CM | POA: Diagnosis not present

## 2021-06-29 DIAGNOSIS — I2584 Coronary atherosclerosis due to calcified coronary lesion: Secondary | ICD-10-CM

## 2021-06-29 DIAGNOSIS — I2510 Atherosclerotic heart disease of native coronary artery without angina pectoris: Secondary | ICD-10-CM

## 2021-06-29 HISTORY — PX: LEFT HEART CATH AND CORONARY ANGIOGRAPHY: CATH118249

## 2021-06-29 SURGERY — LEFT HEART CATH AND CORONARY ANGIOGRAPHY
Anesthesia: LOCAL

## 2021-06-29 MED ORDER — LIDOCAINE HCL (PF) 1 % IJ SOLN
INTRAMUSCULAR | Status: DC | PRN
  Administered 2021-06-29: 2 mL

## 2021-06-29 MED ORDER — SODIUM CHLORIDE 0.9 % IV SOLN: INTRAVENOUS | Status: AC

## 2021-06-29 MED ORDER — HEPARIN SODIUM (PORCINE) 1000 UNIT/ML IJ SOLN
INTRAMUSCULAR | Status: AC
  Filled 2021-06-29: qty 1

## 2021-06-29 MED ORDER — LABETALOL HCL 5 MG/ML IV SOLN: 10.0000 mg | INTRAVENOUS | Status: DC | PRN

## 2021-06-29 MED ORDER — HEPARIN (PORCINE) IN NACL 1000-0.9 UT/500ML-% IV SOLN
INTRAVENOUS | Status: DC | PRN
  Administered 2021-06-29 (×2): 500 mL

## 2021-06-29 MED ORDER — FENTANYL CITRATE (PF) 100 MCG/2ML IJ SOLN
INTRAMUSCULAR | Status: DC | PRN
  Administered 2021-06-29 (×3): 25 ug via INTRAVENOUS

## 2021-06-29 MED ORDER — MIDAZOLAM HCL 2 MG/2ML IJ SOLN
INTRAMUSCULAR | Status: AC
  Filled 2021-06-29: qty 2

## 2021-06-29 MED ORDER — FENTANYL CITRATE (PF) 100 MCG/2ML IJ SOLN
INTRAMUSCULAR | Status: AC
  Filled 2021-06-29: qty 2

## 2021-06-29 MED ORDER — IOHEXOL 350 MG/ML SOLN
INTRAVENOUS | Status: DC | PRN
  Administered 2021-06-29: 45 mL

## 2021-06-29 MED ORDER — HEPARIN SODIUM (PORCINE) 1000 UNIT/ML IJ SOLN
INTRAMUSCULAR | Status: DC | PRN
  Administered 2021-06-29: 5000 [IU] via INTRAVENOUS

## 2021-06-29 MED ORDER — MIDAZOLAM HCL 2 MG/2ML IJ SOLN
INTRAMUSCULAR | Status: DC | PRN
  Administered 2021-06-29 (×3): 1 mg via INTRAVENOUS

## 2021-06-29 MED ORDER — HYDRALAZINE HCL 20 MG/ML IJ SOLN: 10.0000 mg | INTRAMUSCULAR | Status: DC | PRN

## 2021-06-29 MED ORDER — VERAPAMIL HCL 2.5 MG/ML IV SOLN
INTRAVENOUS | Status: DC | PRN
  Administered 2021-06-29: 5 mL via INTRA_ARTERIAL

## 2021-06-29 MED ORDER — VERAPAMIL HCL 2.5 MG/ML IV SOLN
INTRAVENOUS | Status: AC
  Filled 2021-06-29: qty 2

## 2021-06-29 MED ORDER — SODIUM CHLORIDE 0.9 % WEIGHT BASED INFUSION
3.0000 mL/kg/h | INTRAVENOUS | Status: AC
  Administered 2021-06-29: 3 mL/kg/h via INTRAVENOUS

## 2021-06-29 MED ORDER — SODIUM CHLORIDE 0.9 % IV SOLN: 250.0000 mL | INTRAVENOUS | Status: DC | PRN

## 2021-06-29 MED ORDER — LIDOCAINE HCL (PF) 1 % IJ SOLN
INTRAMUSCULAR | Status: AC
  Filled 2021-06-29: qty 30

## 2021-06-29 MED ORDER — SODIUM CHLORIDE 0.9% FLUSH: 3.0000 mL | INTRAVENOUS | Status: DC | PRN

## 2021-06-29 MED ORDER — ASPIRIN 81 MG PO CHEW: 81.0000 mg | CHEWABLE_TABLET | ORAL | Status: DC

## 2021-06-29 MED ORDER — ACETAMINOPHEN 325 MG PO TABS: 650.0000 mg | ORAL_TABLET | ORAL | Status: DC | PRN

## 2021-06-29 MED ORDER — SODIUM CHLORIDE 0.9 % WEIGHT BASED INFUSION: 1.0000 mL/kg/h | INTRAVENOUS | Status: DC

## 2021-06-29 MED ORDER — SODIUM CHLORIDE 0.9% FLUSH: 3.0000 mL | Freq: Two times a day (BID) | INTRAVENOUS | Status: DC

## 2021-06-29 MED ORDER — ONDANSETRON HCL 4 MG/2ML IJ SOLN: 4.0000 mg | Freq: Four times a day (QID) | INTRAMUSCULAR | Status: DC | PRN

## 2021-06-29 MED ORDER — HEPARIN (PORCINE) IN NACL 1000-0.9 UT/500ML-% IV SOLN
INTRAVENOUS | Status: AC
  Filled 2021-06-29: qty 1000

## 2021-06-29 SURGICAL SUPPLY — 8 items
CATH DIAG 6FR JR4 (CATHETERS) ×1 IMPLANT
CATH INFINITI 6F FL3.5 (CATHETERS) ×1 IMPLANT
DEVICE RAD COMP TR BAND LRG (VASCULAR PRODUCTS) ×1 IMPLANT
GLIDESHEATH SLEND SS 6F .021 (SHEATH) ×1 IMPLANT
GUIDEWIRE INQWIRE 1.5J.035X260 (WIRE) IMPLANT
GUIDEWIRE TIGER .035X300 (WIRE) ×1 IMPLANT
INQWIRE 1.5J .035X260CM (WIRE)
WIRE EMERALD 3MM-J .035X150CM (WIRE) ×1 IMPLANT

## 2021-06-29 NOTE — Interval H&P Note (Signed)
History and Physical Interval Note:  06/29/2021 7:51 AM  [name] [name]  has presented today for surgery, with the diagnosis of chest pain , shortness of breath.  The various methods of treatment have been discussed with the patient and family. After consideration of risks, benefits and other options for treatment, the patient has consented to  Procedure(s): LEFT HEART CATH AND CORONARY ANGIOGRAPHY (N/A) as a surgical intervention.  The patient's history has been reviewed, patient examined, no change in status, stable for surgery.  I have reviewed the patient's chart and labs.  Questions were answered to the patient's satisfaction.    Cath Lab Visit (complete for each Cath Lab visit)  Clinical Evaluation Leading to the Procedure:   ACS: No.  Non-ACS:    Anginal Classification: CCS II  Anti-ischemic medical therapy: Minimal Therapy (1 class of medications)  Non-Invasive Test Results: Intermediate-risk stress test findings: cardiac mortality 1-3%/year  Prior CABG: No previous CABG        [name] [name]

## 2021-07-14 ENCOUNTER — Telehealth: Payer: Self-pay | Admitting: Internal Medicine

## 2021-07-14 NOTE — Telephone Encounter (Signed)
Patient approved for [employer] grant from 06/12/2021 - 06/11/2022 $2500  Healthwell ID: [id number]

## 2021-07-18 NOTE — Progress Notes (Signed)
Cardiology Clinic Note   Patient Name: [name] [name] Date of Encounter: 07/20/2021  Primary Care Provider:  Gaynelle Arabian, MD Primary Cardiologist:  Pixie Casino, MD  Patient Profile    [name] [name] 70 year old male presents the clinic today for follow-up of his coronary artery disease.  Past Medical History    Past Medical History:  Diagnosis Date   Arthritis    Carpal tunnel syndrome    Right   Cataracts, bilateral    very mild   Early cataracts, bilateral    Fatty liver    Gallstones    GERD (gastroesophageal reflux disease)    Headache 2011   HX OF VISUAL MIGRAINES - NO PAIN --MIGRAINES ATTRIBUTED TO PT CUTTING NICOTINE PATCH IN HALF BEFORE APPLYING TO HIS SKIN.     Hepatitis 2001   C -treated and no problems since  2001 'cured'   History of hiatal hernia    History of kidney stones    found on CT Scan   HOH (hard of hearing)    lung ca dx'd 12/06/19   RIGHT LUNG   Muscle cramps    hands and legs   Osteopenia    Pain    KNEE OA   Right lower lobe lung mass    Shortness of breath dyspnea    "SOMETIMES FEELS LIKE I CAN'T CATCH A DEEP BREATH "- ON GOING FOR YEARS - STATES MEDICAL DOCTOR SENT HIM FOR CARDIAC WORK UP 2011 - [place]   Stroke ([place])    HX POSSIBLE TIA - RT LEG NUMBNESS AND TINGLING FINGERS AND NUMBNESS RT FACE - ALL RESOLVED ; STATES NEUROLOGIST COULD NOT FIND ANY EVIDENCE OF STROKE - THOUGHT PT'S EPISODE MIGHT BE FROM CUTTING NICOTINE PATCH IN HALF AND APPLYING TO SKIN   Trigger finger    LEFT MIDDLE FINGER   Wears glasses    Past Surgical History:  Procedure Laterality Date   BRONCHIAL BIOPSY  02/24/2020   Procedure: BRONCHIAL BIOPSIES;  Surgeon: Collene Gobble, MD;  Location: [facility];  Service: Pulmonary;;   BRONCHIAL BRUSHINGS  02/24/2020   Procedure: BRONCHIAL BRUSHINGS;  Surgeon: Collene Gobble, MD;  Location: [facility];  Service: Pulmonary;;   BRONCHIAL NEEDLE ASPIRATION BIOPSY  02/24/2020   Procedure: BRONCHIAL  NEEDLE ASPIRATION BIOPSIES;  Surgeon: Collene Gobble, MD;  Location: [place];  Service: Pulmonary;;   BRONCHIAL WASHINGS  02/24/2020   Procedure: BRONCHIAL WASHINGS;  Surgeon: Collene Gobble, MD;  Location: [facility];  Service: Pulmonary;;   COLONOSCOPY     INTERCOSTAL NERVE BLOCK Right 04/28/2020   Procedure: INTERCOSTAL NERVE BLOCK;  Surgeon: Lajuana Matte, MD;  Location: [facility] [place];  Service: Thoracic;  Laterality: Right;   left ankle surgery     fractured   LEFT HEART CATH AND CORONARY ANGIOGRAPHY N/A 06/29/2021   Procedure: LEFT HEART CATH AND CORONARY ANGIOGRAPHY;  Surgeon: Early Osmond, MD;  Location: [place] [facility];  Service: Cardiovascular;  Laterality: N/A;   left knee surgery     MENISCAL REMOVED   left shoulder     torn ligaments left shoulder   LUMBAR FUSION     L3 L4 L5   NODE DISSECTION Right 04/28/2020   Procedure: NODE DISSECTION;  Surgeon: Lajuana Matte, MD;  Location: [facility];  Service: Thoracic;  Laterality: Right;   RIGHT CARPAL TUNNEL RELEASE  2011   RIGHT KNEE ARTHROSCOPY   AUG OR SEPT 2015   AT [facility]  ROOT CANAL     THORACOSCOPY Right 04/28/2020   XI ROBOTIC ASSISTED THORASCOPY-LOBECTOMY (Right)   TONSILLECTOMY     as child   TOTAL KNEE ARTHROPLASTY Left 07/14/2013   Procedure: LEFT TOTAL KNEE ARTHROPLASTY;  Surgeon: Mauri Pole, MD;  Location: [facility];  Service: Orthopedics;  Laterality: Left;   TOTAL KNEE ARTHROPLASTY Right 07/21/2014   Procedure: RIGHT TOTAL KNEE ARTHROPLASTY;  Surgeon: Mauri Pole, MD;  Location: [facility];  Service: Orthopedics;  Laterality: Right;   TOTAL SHOULDER ARTHROPLASTY Right 10/17/2019   Procedure: RIGHT REVERSE TOTAL SHOULDER REPLACEMENT;  Surgeon: Netta Cedars, MD;  Location: [facility];  Service: Orthopedics;  Laterality: Right;  interscalene block   TRIGGER FINGER RELEASE Left    VIDEO BRONCHOSCOPY WITH ENDOBRONCHIAL NAVIGATION Right 02/24/2020   Procedure: VIDEO BRONCHOSCOPY WITH  ENDOBRONCHIAL NAVIGATION;  Surgeon: Collene Gobble, MD;  Location: [facility] END[facility];  Service: Pulmonary;  Laterality: Right;   WEDGE RESECTION Right 04/28/2020   XI ROBOTIC ASSISTED THORASCOPY-RIGHT LOWER LOBE WEDGE RESECTION (Right Chest)     Allergies  Allergies  Allergen Reactions   Statins Other (See Comments)    MYALGIAS   Varenicline Other (See Comments)    Unknown reaction     History of Present Illness    [name] [name] has a PMH of GERD, headache, hepatitis C, hiatal hernia, renal calculi, lung CA (right lung), hyperlipidemia, and coronary artery disease.  He underwent coronary CTA which showed a calcium score of 1300 and placed him in the 91st percentile for age and gender.  His FFR showed positive reduced flow in his proximal RCA.  His LAD and circumflex are not noted to be hemodynamically significant.  Cardiac catheterization was recommended.  He underwent cardiac catheterization on 06/29/2021.  His catheterization showed 1% proximal RCA lesion, 10% ostial LAD lesion, first marginal 30% stenosis, distal circumflex 90% stenosed, mid LAD 20% stenosed, and normal LVEDP.  He was noted to have coronary artery collateralized by right to right septal collaterals.  He was noted to have normal LVEDP.  Medical management was recommended.  He presents the clinic today for follow-up evaluation states he continues to have fatigue.  We reviewed his angiography and he expressed understanding.  He reports that he is having increased stress related to personal relationships.  He was also experiencing some depression.  He was taking medication for this, seen psychology and psychiatry.  He reports that he has recovered well from his right lung wedge resection.  He completed chemotherapy in December.  He is tolerating his Praluent well and reports compliance.  I will give him the salty 6 diet sheet, have him increase his physical activity as tolerated, give him the mindfulness stress reduction sheet, and  plan follow-up for 6 months.  Today he denies chest pain, shortness of breath, lower extremity edema, fatigue, palpitations, melena, hematuria, hemoptysis, diaphoresis, weakness, presyncope, syncope, orthopnea, and PND.   Home Medications    Prior to Admission medications   Medication Sig Start Date End Date Taking? Authorizing Provider  Alirocumab (PRALUENT) 150 MG/ML SOAJ INJECT 1 DOSE INTO THE SKIN EVERY 14 (FOURTEEN) DAYS. 04/08/21   Pixie Casino, MD  aspirin EC 325 MG tablet Take 650-975 mg by mouth every 6 (six) hours as needed for moderate pain.    [provider]  aspirin EC 81 MG tablet Take 81 mg by mouth daily. Swallow whole.    [provider]  clindamycin (CLEOCIN) 150 MG capsule Take 600 mg by mouth See admin  instructions. Take 600 mg 1 hour prior to dental work 12/23/19   [provider]  cyclobenzaprine (FLEXERIL) 10 MG tablet Take 10 mg by mouth 3 (three) times daily.    [provider]  gabapentin (NEURONTIN) 100 MG capsule Take 100 mg by mouth 2 (two) times daily as needed (pain).    [provider]  gabapentin (NEURONTIN) 600 MG tablet Take 600 mg by mouth at bedtime. 05/28/21   [provider]  loratadine (CLARITIN) 10 MG tablet Take 10 mg by mouth daily. AllerClear    [provider]  Magnesium 500 MG TABS Take 500 mg by mouth in the morning, at noon, and at bedtime.    [provider]  naltrexone (DEPADE) 50 MG tablet Take 50 mg by mouth daily. 06/10/21   [provider]  nitroGLYCERIN (NITROSTAT) 0.4 MG SL tablet Place 1 tablet (0.4 mg total) under the tongue every 5 (five) minutes as needed for chest pain. 06/24/21 09/22/21  Pixie Casino, MD  sertraline (ZOLOFT) 100 MG tablet Take 100 mg by mouth daily. 05/13/21   [provider]  tamsulosin (FLOMAX) 0.4 MG CAPS capsule Take 0.4 mg by mouth daily. 03/24/21   [provider]    Family History    Family History  Problem  Relation Age of Onset   Alcohol abuse Father    Hyperlipidemia Father    Heart attack Brother 84   Hyperlipidemia Brother    Hyperlipidemia Sister        3 sisters   Heart attack Brother    Hyperlipidemia Brother    He indicated that his mother is deceased. He indicated that his father is deceased. He indicated that his sister is alive. He indicated that both of his brothers are alive.  Social History    Social History   Socioeconomic History   Marital status: Married    Spouse name: Not on file   Number of children: Not on file   Years of education: Not on file   Highest education level: Not on file  Occupational History   Not on file  Tobacco Use   Smoking status: Former    Types: Cigarettes    Quit date: 04/30/2012    Years since quitting: 9.2   Smokeless tobacco: Never  Vaping Use   Vaping Use: Former  Substance and Sexual Activity   Alcohol use: Not Currently    Alcohol/week: 7.0 standard drinks    Types: 7 Shots of liquor per week   Drug use: Not Currently    Types: Marijuana    Comment: ocassionally, last time smoked 07/2017   Sexual activity: Not on file  Other Topics Concern   Not on file  Social History Narrative   Not on file   Social Determinants of Health   Financial Resource Strain: Not on file  Food Insecurity: Not on file  Transportation Needs: Not on file  Physical Activity: Not on file  Stress: Not on file  Social Connections: Not on file  Intimate Partner Violence: Not on file     Review of Systems    General:  No chills, fever, night sweats or weight changes.  Cardiovascular:  No chest pain, dyspnea on exertion, edema, orthopnea, palpitations, paroxysmal nocturnal dyspnea. Dermatological: No rash, lesions/masses Respiratory: No cough, dyspnea Urologic: No hematuria, dysuria Abdominal:   No nausea, vomiting, diarrhea, bright red blood per rectum, melena, or hematemesis Neurologic:  No visual changes, wkns, changes in mental  status. All other systems  reviewed and are otherwise negative except as noted above.  Physical Exam    VS:  BP 132/78   Pulse (!) 103   Ht 6\' 1"  (1.854 m)   Wt 218 lb (98.9 kg)   SpO2 95%   BMI 28.76 kg/m  , BMI Body mass index is 28.76 kg/m. GEN: Well nourished, well developed, in no acute distress. HEENT: normal. Neck: Supple, no JVD, carotid bruits, or masses. Cardiac: RRR, no murmurs, rubs, or gallops. No clubbing, cyanosis, edema.  Radials/DP/PT 2+ and equal bilaterally.  Respiratory:  Respirations regular and unlabored, clear to auscultation bilaterally. GI: Soft, nontender, nondistended, BS + x 4. MS: no deformity or atrophy. Skin: warm and dry, no rash. Neuro:  Strength and sensation are intact. Psych: Normal affect.  Accessory Clinical Findings    Recent Labs: 09/08/2020: Magnesium 2.0 04/22/2021: ALT 24 06/24/2021: BUN 15; Creatinine, Ser 1.08; Hemoglobin 15.5; Platelets 194; Potassium 4.8; Sodium 140   Recent Lipid Panel    Component Value Date/Time   CHOL 186 06/14/2021 1245   TRIG 148 06/14/2021 1245   HDL 44 06/14/2021 1245   CHOLHDL 4.2 06/14/2021 1245   CHOLHDL 5.6 10/31/2007 0830   VLDL 26 10/31/2007 0830   LDLCALC 116 (H) 06/14/2021 1245    ECG personally reviewed by me today-none today. Cardiac catheterization 06/29/2021    Prox RCA lesion is 100% stenosed.   Ost LAD lesion is 10% stenosed.   1st Mrg lesion is 30% stenosed.   Dist Cx lesion is 90% stenosed.   Mid LAD lesion is 20% stenosed.   LV end diastolic pressure is normal.   1.  Chronic total occlusion of right coronary artery collateralized by right to right and septal collaterals. 2.  Mild obstructive coronary artery disease of the OM and LAD with high-grade disease of the distal left circumflex which is a very small vessel and subtends a small area. 3.  Normal LVEDP.   Recommendations: Medical management.  Diagnostic Dominance: Right Intervention    Assessment & Plan   1.   Coronary artery disease-denies recent episodes of arm neck back or chest discomfort.  Underwent cardiac catheterization on 06/29/2021 which showed right coronary artery collateralization.  Medical management was recommended.  Details above.  Statin intolerant.  Cardiac cath site clean dry intact well-healed with no signs of infection. Continue aspirin, nitroglycerin Heart healthy low-sodium diet-salty 6 given Increase physical activity as tolerated  Hyperlipidemia-06/14/2021: Cholesterol, Total 186; HDL 44; LDL Chol Calc (NIH) 116; Triglycerides 148 Continue Praluent Heart healthy low-sodium high-fiber diet Increase physical activity as tolerated  Progressive fatigue-continues to have fatigue.  History of lung cancer with wedge resection.  Notes increased fatigue with minimal physical activity.  This appears to be related to increased stress and depression. Mindfulness stress reduction sheet given Continue sertraline Follows with psychiatry    Disposition: Follow-up with Dr. Debara Pickett in 4-6 months.  Jossie Ng. Shadiyah Wernli NP-C    07/20/2021, 1:42 PM [facility] [address] Office [phone] Fax [phone]  Notice: This dictation was prepared with Dragon dictation along with smaller phrase technology. Any transcriptional errors that result from this process are unintentional and may not be corrected upon review.  I spent 13 minutes examining this patient, reviewing medications, and using patient centered shared decision making involving her cardiac care.  Prior to her visit I spent greater than 20 minutes reviewing her past medical history,  medications, and prior cardiac tests.

## 2021-07-20 ENCOUNTER — Ambulatory Visit (INDEPENDENT_AMBULATORY_CARE_PROVIDER_SITE_OTHER): Payer: Medicare Other | Admitting: General Practice

## 2021-07-20 ENCOUNTER — Encounter: Payer: Self-pay | Admitting: General Practice

## 2021-07-20 ENCOUNTER — Other Ambulatory Visit: Payer: Self-pay

## 2021-07-20 VITALS — BP 132/78 | HR 103 | Ht 73.0 in | Wt 218.0 lb

## 2021-07-20 DIAGNOSIS — I2584 Coronary atherosclerosis due to calcified coronary lesion: Secondary | ICD-10-CM

## 2021-07-20 DIAGNOSIS — E785 Hyperlipidemia, unspecified: Secondary | ICD-10-CM

## 2021-07-20 DIAGNOSIS — R5383 Other fatigue: Secondary | ICD-10-CM | POA: Diagnosis not present

## 2021-07-20 DIAGNOSIS — I2510 Atherosclerotic heart disease of native coronary artery without angina pectoris: Secondary | ICD-10-CM

## 2021-07-20 NOTE — Patient Instructions (Signed)
Medication Instructions:  The current medical regimen is effective;  continue present plan and medications as directed. Please refer to the Current Medication list given to you today.   *If you need a refill on your cardiac medications before your next appointment, please call your pharmacy*  Lab Work:   Testing/Procedures:  NONE    NONE  Special Instructions PLEASE READ AND FOLLOW SALTY 6-ATTACHED-1,800mg  daily  PLEASE INCREASE PHYSICAL ACTIVITY AS TOLERATED   PLEASE READ STRESS REDUCTION TIPS-ATTACHED  Follow-Up: Your next appointment:  6 month(s) In Person with [name] Casino, MD   Please call our office 2 months in advance to schedule this appointment   At [facility], you and your health needs are our priority.  As part of our continuing mission to provide you with exceptional heart care, we have created designated Provider Care Teams.  These Care Teams include your primary Cardiologist (physician) and Advanced Practice Providers (APPs -  Physician Assistants and Nurse Practitioners) who all work together to provide you with the care you need, when you need it.            6 SALTY THINGS TO AVOID     1,800MG  DAILY     Mindfulness-Based Stress Reduction Mindfulness-based stress reduction (MBSR) is a program that helps people learn to practice mindfulness. Mindfulness is the practice of consciously paying attention to the present moment. MBSR focuses on developing self-awareness, which lets you respond to life stress without judgment or negative feelings. It can be learned and practiced through techniques such as education, breathing exercises, meditation, and yoga. MBSR includes several mindfulness techniques in one program. MBSR works best when you understand the treatment, are willing to try new things, and can commit to spending time practicing what you learn. MBSR training may include learning about: How your feelings, thoughts, and reactions affect your body. New ways to  respond to things that cause negative thoughts to start (triggers). How to notice your thoughts and let go of them. Practicing awareness of everyday things that you normally do without thinking. The techniques and goals of different types of meditation. What are the benefits of MBSR? MBSR can have many benefits, which include helping you to: Develop self-awareness. This means knowing and understanding yourself. Learn skills and attitudes that help you to take part in your own health care. Learn new ways to care for yourself. Be more accepting about how things are, and let things go. Be less judgmental and approach things with an open mind. Be patient with yourself and trust yourself more. MBSR has also been shown to: Reduce negative emotions, such as sadness, overwhelm, and worry. Improve memory and focus. Change how you sense and react to pain. Boost your body's ability to fight infections. Help you connect better with other people. Improve your sense of well-being. How to practice mindfulness To do a basic awareness exercise: Find a comfortable place to sit. Pay attention to the present moment. Notice your thoughts, feelings, and surroundings just as they are. Avoid judging yourself, your feelings, or your surroundings. Make note of any judgment that comes up and let it go. Your mind may wander, and that is okay. Make note of when your thoughts drift, and return your attention to the present moment. To do basic mindfulness meditation: Find a comfortable place to sit. This may include a stable chair or a firm floor cushion. Sit upright with your back straight. Let your arms fall next to your sides, with your hands resting on your  legs. If you are sitting in a chair, rest your feet flat on the floor. If you are sitting on a cushion, cross your legs in front of you. Keep your head in a neutral position with your chin dropped slightly. Relax your jaw and rest the tip of your tongue on the  roof of your mouth. Drop your gaze to the floor or close your eyes. Breathe normally and pay attention to your breath. Feel the air moving in and out of your nose. Feel your belly expanding and relaxing with each breath. Your mind may wander, and that is okay. Make note of when your thoughts drift, and return your attention to your breath. Avoid judging yourself, your feelings, or your surroundings. Make note of any judgment or feelings that come up, let them go, and bring your attention back to your breath. When you are ready, lift your gaze or open your eyes. Pay attention to how your body feels after the meditation. Follow these instructions at home:  Find a local in-person or online MBSR program. Set aside some time regularly for mindfulness practice. Practice every day if you can. Even 10 minutes of practice is helpful. Find a mindfulness practice that works best for you. This may include one or more of the following: Meditation. This involves focusing your mind on a certain thought or activity. Breathing awareness exercises. These help you to stay present by focusing on your breath. Body scan. For this practice, you lie down and pay attention to each part of your body from head to toe. You can identify tension and soreness and consciously relax parts of your body. Yoga. Yoga involves stretching and breathing, and it can improve your ability to move and be flexible. It can also help you to test your body's limits, which can help you release stress. Mindful eating. This way of eating involves focusing on the taste, texture, color, and smell of each bite of food. This slows down eating and helps you feel full sooner. For this reason, it can be an important part of a weight loss plan. Find a podcast or recording that provides guidance for breathing awareness, body scan, or meditation exercises. You can listen to these any time when you have a free moment to rest without distractions. Follow your  treatment plan as told by your health care provider. This may include taking regular medicines and making changes to your diet or lifestyle as recommended. Where to find more information You can find more information about MBSR from: Your health care provider. Community-based meditation centers or programs. Programs offered near you. Summary Mindfulness-based stress reduction (MBSR) is a program that teaches you how to consciously pay attention to the present moment. It is used to help you deal better with daily stress, feelings, and pain. MBSR focuses on developing self-awareness, which allows you to respond to life stress without judgment or negative feelings. MBSR programs may involve learning different mindfulness practices, such as breathing exercises, meditation, yoga, body scan, or mindful eating. Find a mindfulness practice that works best for you, and set aside time for it on a regular basis. This information is not intended to replace advice given to you by your health care provider. Make sure you discuss any questions you have with your health care provider. Document Revised: 04/07/2021 Document Reviewed: 04/07/2021 Elsevier Patient Education  [place].

## 2021-10-24 ENCOUNTER — Other Ambulatory Visit: Payer: Self-pay

## 2021-10-24 ENCOUNTER — Inpatient Hospital Stay: Payer: Medicare Other | Attending: Internal Medicine

## 2021-10-24 ENCOUNTER — Ambulatory Visit (HOSPITAL_COMMUNITY)
Admission: RE | Admit: 2021-10-24 | Discharge: 2021-10-24 | Disposition: A | Discharge status: Home / Self Care | Payer: Medicare Other | Source: Ambulatory Visit | Attending: Internal Medicine | Admitting: Internal Medicine

## 2021-10-24 DIAGNOSIS — C3490 Malignant neoplasm of unspecified part of unspecified bronchus or lung: Secondary | ICD-10-CM | POA: Insufficient documentation

## 2021-10-24 DIAGNOSIS — Z9221 Personal history of antineoplastic chemotherapy: Secondary | ICD-10-CM | POA: Insufficient documentation

## 2021-10-24 DIAGNOSIS — Z902 Acquired absence of lung [part of]: Secondary | ICD-10-CM | POA: Insufficient documentation

## 2021-10-24 DIAGNOSIS — Z85118 Personal history of other malignant neoplasm of bronchus and lung: Secondary | ICD-10-CM | POA: Insufficient documentation

## 2021-10-24 LAB — CMP (CANCER CENTER ONLY)
ALT: 20 U/L (ref 0–44)
AST: 19 U/L (ref 15–41)
Albumin: 4.4 g/dL (ref 3.5–5.0)
Alkaline Phosphatase: 88 U/L (ref 38–126)
Anion gap: 6 (ref 5–15)
BUN: 23 mg/dL (ref 8–23)
CO2: 28 mmol/L (ref 22–32)
Calcium: 9.2 mg/dL (ref 8.9–10.3)
Chloride: 102 mmol/L (ref 98–111)
Creatinine: 1.22 mg/dL (ref 0.61–1.24)
GFR, Estimated: >60 mL/min (ref >60)
Glucose, Bld: 156 mg/dL — ABNORMAL HIGH (ref 70–99)
Potassium: 4.3 mmol/L (ref 3.5–5.1)
Sodium: 136 mmol/L (ref 135–145)
Total Bilirubin: 0.9 mg/dL (ref 0.3–1.2)
Total Protein: 6.9 g/dL (ref 6.5–8.1)

## 2021-10-24 LAB — CBC WITH DIFFERENTIAL (CANCER CENTER ONLY)
Abs Immature Granulocytes: 0.04 10*3/uL (ref 0.00–0.07)
Basophils Absolute: 0.1 10*3/uL (ref 0.0–0.1)
Basophils Relative: 0 %
Eosinophils Absolute: 0.1 10*3/uL (ref 0.0–0.5)
Eosinophils Relative: 1 %
HCT: 46.4 % (ref 39.0–52.0)
Hemoglobin: 15.7 g/dL (ref 13.0–17.0)
Immature Granulocytes: 0 %
Lymphocytes Relative: 11 %
Lymphs Abs: 1.3 10*3/uL (ref 0.7–4.0)
MCH: 30.5 pg (ref 26.0–34.0)
MCHC: 33.8 g/dL (ref 30.0–36.0)
MCV: 90.3 fL (ref 80.0–100.0)
Monocytes Absolute: 0.7 10*3/uL (ref 0.1–1.0)
Monocytes Relative: 5 %
Neutro Abs: 10 10*3/uL — ABNORMAL HIGH (ref 1.7–7.7)
Neutrophils Relative %: 83 %
Platelet Count: 162 10*3/uL (ref 150–400)
RBC: 5.14 MIL/uL (ref 4.22–5.81)
RDW: 13.5 % (ref 11.5–15.5)
WBC Count: 12.1 10*3/uL — ABNORMAL HIGH (ref 4.0–10.5)
nRBC: 0 % (ref 0.0–0.2)

## 2021-10-24 MED ORDER — IOHEXOL 300 MG/ML  SOLN
75.0000 mL | Freq: Once | INTRAMUSCULAR | Status: AC | PRN
Start: 2021-10-24 — End: 2021-10-24
  Administered 2021-10-24: 75 mL via INTRAVENOUS

## 2021-10-26 ENCOUNTER — Inpatient Hospital Stay (HOSPITAL_BASED_OUTPATIENT_CLINIC_OR_DEPARTMENT_OTHER): Payer: Medicare Other | Admitting: Internal Medicine

## 2021-10-26 ENCOUNTER — Other Ambulatory Visit: Payer: Self-pay

## 2021-10-26 VITALS — BP 134/77 | HR 96 | Temp 98.4°F | Resp 16 | Ht 73.0 in | Wt 208.1 lb

## 2021-10-26 DIAGNOSIS — C3490 Malignant neoplasm of unspecified part of unspecified bronchus or lung: Secondary | ICD-10-CM | POA: Diagnosis not present

## 2021-10-26 DIAGNOSIS — Z902 Acquired absence of lung [part of]: Secondary | ICD-10-CM | POA: Diagnosis not present

## 2021-10-26 DIAGNOSIS — Z85118 Personal history of other malignant neoplasm of bronchus and lung: Secondary | ICD-10-CM | POA: Diagnosis present

## 2021-10-26 DIAGNOSIS — Z9221 Personal history of antineoplastic chemotherapy: Secondary | ICD-10-CM | POA: Diagnosis not present

## 2021-10-26 NOTE — Progress Notes (Signed)
[place] [phone]   [phone]) [phone]  OFFICE PROGRESS NOTE  Gaynelle Arabian, MD 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   PRIOR THERAPY:   1) Status post right lower lobectomy with lymph node dissection under the care of Dr. Kipp Brood on 04/28/2020. 2)  Adjuvant systemic chemotherapy with cisplatin 75 mg/m2 and Alimta 500 mg/mm IV every 3 weeks.  First dose expected on 06/16/2020.  Status post 4 cycles.   CURRENT THERAPY:  Observation.  INTERVAL HISTORY: [name] [name] 71 y.o. male returns to the clinic today for follow-up visit.  The patient is feeling fine today with no concerning complaints.  He had cardiac catheterization performed recently and he is currently on medical management.  He denied having any current chest pain but has shortness of breath with exertion with no cough or hemoptysis.  He denied having any nausea, vomiting, diarrhea or constipation.  He has no headache or visual changes.  He has no recent weight loss or night sweats.  The patient is here today for evaluation with repeat CT scan of the chest for restaging of his disease.   MEDICAL HISTORY: Past Medical History:  Diagnosis Date   Arthritis    Carpal tunnel syndrome    Right   Cataracts, bilateral    very mild   Early cataracts, bilateral    Fatty liver    Gallstones    GERD (gastroesophageal reflux disease)    Headache 2011   HX OF VISUAL MIGRAINES - NO PAIN --MIGRAINES ATTRIBUTED TO PT CUTTING NICOTINE PATCH IN HALF BEFORE APPLYING TO HIS SKIN.     Hepatitis 2001   C -treated and no problems since  2001 'cured'   History of hiatal hernia    History of kidney stones    found on CT Scan   HOH (hard of hearing)    lung ca dx'd 12/06/19   RIGHT LUNG   Muscle cramps    hands and legs    Osteopenia    Pain    KNEE OA   Right lower lobe lung mass    Shortness of breath dyspnea    "SOMETIMES FEELS LIKE I CAN'T CATCH A DEEP BREATH "- ON GOING FOR YEARS - STATES MEDICAL DOCTOR SENT HIM FOR CARDIAC WORK UP 2011 - [place]   Stroke ([place])    HX POSSIBLE TIA - RT LEG NUMBNESS AND TINGLING FINGERS AND NUMBNESS RT FACE - ALL RESOLVED ; STATES NEUROLOGIST COULD NOT FIND ANY EVIDENCE OF STROKE - THOUGHT PT'S EPISODE MIGHT BE FROM CUTTING NICOTINE PATCH IN HALF AND APPLYING TO SKIN   Trigger finger    LEFT MIDDLE FINGER   Wears glasses     ALLERGIES:  is allergic to statins and varenicline.  MEDICATIONS:  Current Outpatient Medications  Medication Sig Dispense Refill   Alirocumab (PRALUENT) 150 MG/ML SOAJ INJECT 1 DOSE INTO THE SKIN EVERY 14 (FOURTEEN) DAYS. 2 mL 11   aspirin EC 325 MG tablet Take 650-975 mg by mouth every 6 (six) hours as needed for moderate pain.     aspirin EC 81 MG tablet Take 81 mg by mouth daily. Swallow whole.     clindamycin (CLEOCIN) 150 MG capsule Take 600 mg by mouth See admin instructions. Take 600 mg 1 hour prior to dental work  cyclobenzaprine (FLEXERIL) 10 MG tablet Take 10 mg by mouth 3 (three) times daily.     gabapentin (NEURONTIN) 100 MG capsule Take 100 mg by mouth 2 (two) times daily as needed (pain).     gabapentin (NEURONTIN) 600 MG tablet Take 600 mg by mouth at bedtime.     loratadine (CLARITIN) 10 MG tablet Take 10 mg by mouth daily. AllerClear     Magnesium 500 MG TABS Take 500 mg by mouth in the morning, at noon, and at bedtime.     naltrexone (DEPADE) 50 MG tablet Take 50 mg by mouth daily.     nitroGLYCERIN (NITROSTAT) 0.4 MG SL tablet Place 1 tablet (0.4 mg total) under the tongue every 5 (five) minutes as needed for chest pain. 25 tablet 3   sertraline (ZOLOFT) 100 MG tablet Take 100 mg by mouth daily.     tamsulosin (FLOMAX) 0.4 MG CAPS capsule Take 0.4 mg by mouth daily.     No current facility-administered  medications for this visit.    SURGICAL HISTORY:  Past Surgical History:  Procedure Laterality Date   BRONCHIAL BIOPSY  02/24/2020   Procedure: BRONCHIAL BIOPSIES;  Surgeon: Collene Gobble, MD;  Location: [facility] ENDOSCOPY;  Service: Pulmonary;;   BRONCHIAL BRUSHINGS  02/24/2020   Procedure: BRONCHIAL BRUSHINGS;  Surgeon: Collene Gobble, MD;  Location: [facility] ENDOSCOPY;  Service: Pulmonary;;   BRONCHIAL NEEDLE ASPIRATION BIOPSY  02/24/2020   Procedure: BRONCHIAL NEEDLE ASPIRATION BIOPSIES;  Surgeon: Collene Gobble, MD;  Location: [place];  Service: Pulmonary;;   BRONCHIAL WASHINGS  02/24/2020   Procedure: BRONCHIAL WASHINGS;  Surgeon: Collene Gobble, MD;  Location: [facility] ENDOSCOPY;  Service: Pulmonary;;   COLONOSCOPY     INTERCOSTAL NERVE BLOCK Right 04/28/2020   Procedure: INTERCOSTAL NERVE BLOCK;  Surgeon: Lajuana Matte, MD;  Location: [place];  Service: Thoracic;  Laterality: Right;   left ankle surgery     fractured   LEFT HEART CATH AND CORONARY ANGIOGRAPHY N/A 06/29/2021   Procedure: LEFT HEART CATH AND CORONARY ANGIOGRAPHY;  Surgeon: Early Osmond, MD;  Location: [place] [facility];  Service: Cardiovascular;  Laterality: N/A;   left knee surgery     MENISCAL REMOVED   left shoulder     torn ligaments left shoulder   LUMBAR FUSION     L3 L4 L5   NODE DISSECTION Right 04/28/2020   Procedure: NODE DISSECTION;  Surgeon: Lajuana Matte, MD;  Location: [place];  Service: Thoracic;  Laterality: Right;   RIGHT CARPAL TUNNEL RELEASE  2011   RIGHT KNEE ARTHROSCOPY   AUG OR SEPT 2015   AT [place] Right 04/28/2020   XI ROBOTIC ASSISTED THORASCOPY-LOBECTOMY (Right)   TONSILLECTOMY     as child   TOTAL KNEE ARTHROPLASTY Left 07/14/2013   Procedure: LEFT TOTAL KNEE ARTHROPLASTY;  Surgeon: Mauri Pole, MD;  Location: [facility];  Service: Orthopedics;  Laterality: Left;   TOTAL KNEE ARTHROPLASTY Right 07/21/2014   Procedure: RIGHT TOTAL  KNEE ARTHROPLASTY;  Surgeon: Mauri Pole, MD;  Location: [facility];  Service: Orthopedics;  Laterality: Right;   TOTAL SHOULDER ARTHROPLASTY Right 10/17/2019   Procedure: RIGHT REVERSE TOTAL SHOULDER REPLACEMENT;  Surgeon: Netta Cedars, MD;  Location: [facility];  Service: Orthopedics;  Laterality: Right;  interscalene block   TRIGGER FINGER RELEASE Left    VIDEO BRONCHOSCOPY WITH ENDOBRONCHIAL NAVIGATION Right 02/24/2020   Procedure: VIDEO BRONCHOSCOPY WITH ENDOBRONCHIAL NAVIGATION;  Surgeon: Collene Gobble, MD;  Location: [facility] ENDOSCOPY;  Service: Pulmonary;  Laterality: Right;   WEDGE RESECTION Right 04/28/2020   XI ROBOTIC ASSISTED THORASCOPY-RIGHT LOWER LOBE WEDGE RESECTION (Right Chest)     REVIEW OF SYSTEMS:  A comprehensive review of systems was negative except for: Respiratory: positive for dyspnea on exertion   PHYSICAL EXAMINATION: General appearance: alert, cooperative, and no distress Head: Normocephalic, without obvious abnormality, atraumatic Neck: no adenopathy, no JVD, supple, symmetrical, trachea midline, and thyroid not enlarged, symmetric, no tenderness/mass/nodules Lymph nodes: Cervical, supraclavicular, and axillary nodes normal. Resp: clear to auscultation bilaterally Back: symmetric, no curvature. ROM normal. No CVA tenderness. Cardio: regular rate and rhythm, S1, S2 normal, no murmur, click, rub or gallop GI: soft, non-tender; bowel sounds normal; no masses,  no organomegaly Extremities: extremities normal, atraumatic, no cyanosis or edema  ECOG PERFORMANCE STATUS: 1 - Symptomatic but completely ambulatory   Blood pressure 134/77, pulse 96, temperature 98.4 F (36.9 C), temperature source Tympanic, resp. rate 16, height 6\' 1"  (1.854 m), weight 208 lb 1.6 oz (94.4 kg), SpO2 97 %.  LABORATORY DATA: Lab Results  Component Value Date   WBC 12.1 (H) 10/24/2021   HGB 15.7 10/24/2021   HCT 46.4 10/24/2021   MCV 90.3 10/24/2021   PLT 162 10/24/2021      Chemistry       Component Value Date/Time   NA 136 10/24/2021 1124   NA 140 06/24/2021 1608   K 4.3 10/24/2021 1124   CL 102 10/24/2021 1124   CO2 28 10/24/2021 1124   BUN 23 10/24/2021 1124   BUN 15 06/24/2021 1608   CREATININE 1.22 10/24/2021 1124      Component Value Date/Time   CALCIUM 9.2 10/24/2021 1124   ALKPHOS 88 10/24/2021 1124   AST 19 10/24/2021 1124   ALT 20 10/24/2021 1124   BILITOT 0.9 10/24/2021 1124       RADIOGRAPHIC STUDIES: CT Chest W Contrast  Result Date: 10/25/2021 CLINICAL DATA:  71 year old male with history of non-small cell lung cancer diagnosed in August 2021 status post right lower lobectomy and chemotherapy which is now complete. Follow-up study. EXAM: CT CHEST WITH CONTRAST TECHNIQUE: Multidetector CT imaging of the chest was performed during intravenous contrast administration. RADIATION DOSE REDUCTION: This exam was performed according to the departmental dose-optimization program which includes automated exposure control, adjustment of the mA and/or kV according to patient size and/or use of iterative reconstruction technique. CONTRAST:  51mL OMNIPAQUE IOHEXOL 300 MG/ML  SOLN COMPARISON:  Cardiac CT 06/21/2021.  Chest CT 04/22/2021. FINDINGS: Cardiovascular: Heart size is normal. There is no significant pericardial fluid, thickening or pericardial calcification. There is aortic atherosclerosis, as well as atherosclerosis of the great vessels of the mediastinum and the coronary arteries, including calcified atherosclerotic plaque in the left main, left anterior descending, left circumflex and right coronary arteries. Mediastinum/Nodes: No pathologically enlarged mediastinal or hilar lymph nodes. Esophagus is unremarkable in appearance. No axillary lymphadenopathy. Lungs/Pleura: Status post right lower lobectomy. Compensatory hyperexpansion of the right middle and upper lobes. No suspicious appearing pulmonary nodules or masses are noted. No acute consolidative airspace  disease. No pleural effusions. Upper Abdomen: Multiple small round enhancing lesions again noted throughout the visualized splenic parenchyma, incompletely characterized on today's examination, but previously not hypermetabolic on PET-CT. Incompletely imaged low-attenuation lesion in the interpolar region of the right kidney measuring at least 4.1 cm in diameter, not characterized on today's study, but previously not hypermetabolic on PET-CT, likely a cyst. Aortic atherosclerosis.  Musculoskeletal: Status post right shoulder arthroplasty. There are no aggressive appearing lytic or blastic lesions noted in the visualized portions of the skeleton. IMPRESSION: 1. Status post right lower lobectomy. No findings to suggest recurrent or metastatic disease in the thorax. 2. Aortic atherosclerosis, in addition to left main and three-vessel coronary artery disease. Please note that although the presence of coronary artery calcium documents the presence of coronary artery disease, the severity of this disease and any potential stenosis cannot be assessed on this non-gated CT examination. Assessment for potential risk factor modification, dietary therapy or pharmacologic therapy may be warranted, if clinically indicated. 3. Additional incidental findings, as above. Aortic Atherosclerosis (ICD10-I70.0). Electronically Signed   By: Vinnie Langton M.D.   On: 10/25/2021 07:24     ASSESSMENT AND PLAN: This is a very pleasant 71 years old white male with a stage IIIa non-small cell lung cancer, mucinous adenocarcinoma status post right lower lobectomy with lymph node dissection in August 2021. The patient underwent adjuvant systemic chemotherapy with cisplatin and Alimta status post 4 cycles. The patient tolerated the previous 4 cycles of his treatment well with no concerning adverse effect except for fatigue. He has been on observation for more than a year now.  The patient is feeling fine with no concerning complaints except  for the intermittent shortness of breath with exertion. He had repeat CT scan of the chest performed recently.  I personally and independently reviewed the scans and discussed the results with the patient today. His scan showed no concerning findings for disease recurrence or metastasis. I recommended for him to continue on observation with repeat CT scan of the chest in 6 months. The patient was advised to call immediately if he has any other concerning symptoms in the interval. The patient voices understanding of current disease status and treatment options and is in agreement with the current care plan.  All questions were answered. The patient knows to call the clinic with any problems, questions or concerns. We can certainly see the patient much sooner if necessary.  Disclaimer: This note was dictated with voice recognition software. Similar sounding words can inadvertently be transcribed and may not be corrected upon review.

## 2021-11-30 ENCOUNTER — Other Ambulatory Visit: Payer: Self-pay | Admitting: Internal Medicine

## 2021-11-30 ENCOUNTER — Other Ambulatory Visit: Payer: Self-pay | Admitting: *Deleted

## 2021-11-30 ENCOUNTER — Encounter: Payer: Self-pay | Admitting: Internal Medicine

## 2021-11-30 DIAGNOSIS — E785 Hyperlipidemia, unspecified: Secondary | ICD-10-CM

## 2021-12-12 ENCOUNTER — Ambulatory Visit: Payer: Medicare Other | Admitting: Internal Medicine

## 2021-12-13 LAB — LIPID PANEL
Chol/HDL Ratio: 2.9 ratio (ref 0.0–5.0)
Cholesterol, Total: 153 mg/dL (ref 100–199)
HDL: 53 mg/dL (ref >39)
LDL Chol Calc (NIH): 78 mg/dL (ref 0–99)
Triglycerides: 126 mg/dL (ref 0–149)
VLDL Cholesterol Cal: 22 mg/dL (ref 5–40)

## 2021-12-19 ENCOUNTER — Encounter: Payer: Self-pay | Admitting: Internal Medicine

## 2021-12-19 ENCOUNTER — Ambulatory Visit (INDEPENDENT_AMBULATORY_CARE_PROVIDER_SITE_OTHER): Payer: Medicare Other | Admitting: Neurology

## 2021-12-19 ENCOUNTER — Encounter: Payer: Self-pay | Admitting: Neurology

## 2021-12-19 VITALS — BP 123/78 | HR 69 | Ht 73.0 in | Wt 211.0 lb

## 2021-12-19 DIAGNOSIS — G3184 Mild cognitive impairment, so stated: Secondary | ICD-10-CM

## 2021-12-19 HISTORY — DX: Mild cognitive impairment of uncertain or unknown etiology: G31.84

## 2021-12-19 MED ORDER — DONEPEZIL HCL 5 MG PO TABS: 5.0000 mg | ORAL_TABLET | Freq: Every day | ORAL | 11 refills | Status: DC

## 2021-12-19 NOTE — Patient Instructions (Addendum)
Start with Aricept 5 mg nightly, side effects of medications including diarrhea and bradycardia discussed with patient  ?Continue your other medications  ?Follow up with your PCP  ?Return in 1 year ? ? ? ?There are well-accepted and sensible ways to reduce risk for Alzheimers disease and other degenerative brain disorders . ? ?Exercise Daily Walk A daily 20 minute walk should be part of your routine. Disease related apathy can be a significant roadblock to exercise and the only way to overcome this is to make it a daily routine and perhaps have a reward at the end (something your loved one loves to eat or drink perhaps) or a personal trainer coming to the home can also be very useful. Most importantly, the patient is much more likely to exercise if the caregiver / spouse does it with him/her. In general a structured, repetitive schedule is best. ? ?General Health: Any diseases which effect your body will effect your brain such as a pneumonia, urinary infection, blood clot, heart attack or stroke. Keep contact with your primary care doctor for regular follow ups. ? ?Sleep. A good nights sleep is healthy for the brain. Seven hours is recommended. If you have insomnia or poor sleep habits we can give you some instructions. If you have sleep apnea wear your mask. ? ?Diet: Eating a heart healthy diet is also a good idea; fish and poultry instead of red meat, nuts (mostly non-peanuts), vegetables, fruits, olive oil or canola oil (instead of butter), minimal salt (use other spices to flavor foods), whole grain rice, bread, cereal and pasta and wine in moderation.Research is now showing that the MIND diet, which is a combination of The Mediterranean diet and the DASH diet, is beneficial for cognitive processing and longevity. Information about this diet can be found in The MIND Diet, a book by Doyne Keel, MS, RDN, and online at NotebookDistributors.si ? ?Finances, Power of Producer, television/film/video  Directives: You should consider putting legal safeguards in place with regard to financial and medical decision making. While the spouse always has power of attorney for medical and financial issues in the absence of any form, you should consider what you want in case the spouse / caregiver is no longer around or capable of making decisions.  ? ? ? ?Heart-head connection ? ?New research shows there are things we can do to reduce the risk of mild cognitive impairment and dementia. ? ?Several conditions known to increase the risk of cardiovascular disease -- such as high blood pressure, diabetes and high cholesterol -- also increase the risk of developing Alzheimer's. Some autopsy studies show that as many as 59 percent of individuals with Alzheimer's disease also have cardiovascular disease. ? ?A longstanding question is why some people develop hallmark Alzheimer's plaques and tangles but do not develop the symptoms of Alzheimer's. Vascular disease may help researchers eventually find an answer. Some autopsy studies suggest that plaques and tangles may be present in the brain without causing symptoms of cognitive decline unless the brain also shows evidence of vascular disease. More research is needed to better understand the link between vascular health and Alzheimer's. ? ?Physical exercise and diet ?Regular physical exercise may be a beneficial strategy to lower the risk of Alzheimer's and vascular dementia. Exercise may directly benefit brain cells by increasing blood and oxygen flow in the brain. Because of its known cardiovascular benefits, a medically approved exercise program is a valuable part of any overall wellness plan. ? ?Current evidence suggests that heart-healthy eating  may also help protect the brain. Heart-healthy eating includes limiting the intake of sugar and saturated fats and making sure to eat plenty of fruits, vegetables, and whole grains. No one diet is best. Two diets that have been studied  and may be beneficial are the DASH (Dietary Approaches to Stop Hypertension) diet and the Mediterranean diet. The DASH diet emphasizes vegetables, fruits and fat-free or low-fat dairy products; includes whole grains, fish, poultry, beans, seeds, nuts and vegetable oils; and limits sodium, sweets, sugary beverages and red meats. A Mediterranean diet includes relatively little red meat and emphasizes whole grains, fruits and vegetables, fish and shellfish, and nuts, olive oil and other healthy fats. ? ?Social connections and intellectual activity ?A number of studies indicate that maintaining strong social connections and keeping mentally active as we age might lower the risk of cognitive decline and Alzheimer's. Experts are not certain about the reason for this association. It may be due to direct mechanisms through which social and mental stimulation strengthen connections between nerve cells in the brain. ? ?Head trauma ?There appears to be a strong link between future risk of Alzheimer's and serious head trauma, especially when injury involves loss of consciousness. You can help reduce your risk of Alzheimer's by protecting your head. ? Wear a seat belt ? Use a helmet when participating in sports ? "Fall-proof" your home ?  ?What you can do now ?While research is not yet conclusive, certain lifestyle choices, such as physical activity and diet, may help support brain health and prevent Alzheimer's. Many of these lifestyle changes have been shown to lower the risk of other diseases, like heart disease and diabetes, which have been linked to Alzheimer's. With few drawbacks and plenty of known benefits, healthy lifestyle choices can improve your health and possibly protect your brain. ? ?Learn more about brain health. ?You can help increase our knowledge by considering participation in a clinical study. Our free clinical trial matching services, TrialMatch?, can help you find clinical trials in your area that are  seeking volunteers. ? ? ? ?

## 2021-12-19 NOTE — Progress Notes (Signed)
? ?[facility] ? ?PATIENT: [name] [name] ?DOB: 1950/12/26 ? ?REQUESTING CLINICIAN: Gaynelle Arabian, MD ?HISTORY FROM: Patient  ?REASON FOR VISIT: Memory deficits  ? ? ?HISTORICAL ? ?CHIEF COMPLAINT:  ?Chief Complaint  ?Patient presents with  ? New Patient (Initial Visit)  ?  Rm 15. Alone. ?NP/Paper proficient/Robert Ehinger MD [facility]/Memory loss.  ? ? ?HISTORY OF PRESENT ILLNESS:  ?This is a 71 year old gentleman with past medical history of chronic pain, depression, history of lung cancer, who is presenting with memory problem.  Patient describes memory problem as he cannot remember dates, having trouble remembering the year of his marriage.  He also reports that he is easily distracted, he will get up to go to a different room to pick up something but once he reaches the room, he will forget why he came here for the first. At time, he will forget what he is supposed to do.  This has been going on for the past couple years but worse in the past year.  He said that wife has complained about his memory.  He does have a family history of Alzheimer disease, aunt was diagnosed in her 47s and died in her 46s.  He is still independent, able to perform all activity of daily living.  ?Patient used to work for [employer], he retired in 2020.  Prior to that he did a very detailed work, daily for 25 years.  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   ? ? ?TBI: No past history of TBI ?Stroke:   no past history of stroke ?Seizures:   no past history of seizures ?Sleep:   no history of sleep apnea.   ?Mood:  patient denies anxiety and depression ? ?Functional status: independent in all ADLs and IADLs ?Patient lives with wife. ?Cooking: wife, will try to cook with reading instructions  ?Cleaning: patient  ?Shopping: will make a list if more than 3 things  ?Bathing: patient  ?Toileting: patient  ?Driving: Yes, no recent accident  ?Bills: No  ?Medications: Gabapentin, Flexeril, Opioids   ?Ever left the stove on by accident?: yes ?Forget how to use items around the house?: No  ?Getting lost going to familiar places?: No  ?Forgetting loved ones names?: No  ?Word finding difficulty? Yes  ?Sleep: Pretty good, will get about 8-10 ? ? ?OTHER MEDICAL CONDITIONS: Chronic pain, Depression, History of lung cancer (RLL removed, 4 sessions of chemo)  ? ? ?REVIEW OF SYSTEMS: Full 14 system review of systems performed and negative with exception of: as noted in the HPI  ? ?ALLERGIES: ?Allergies  ?Allergen Reactions  ? Statins Other (See Comments)  ?  MYALGIAS  ? Varenicline Other (See Comments)  ?  Unknown reaction   ? ? ?HOME MEDICATIONS: ?Outpatient Medications Prior to Visit  ?Medication Sig Dispense Refill  ? Alirocumab (PRALUENT) 150 MG/ML SOAJ INJECT 1 DOSE INTO THE SKIN EVERY 14 (FOURTEEN) DAYS. 2 mL 11  ? aspirin EC 325 MG tablet Take 650-975 mg by mouth every 6 (six) hours as needed for moderate pain.    ? clindamycin (CLEOCIN) 150 MG capsule Take 600 mg by mouth See admin instructions. Take 600 mg 1 hour prior to dental work    ? cyclobenzaprine (FLEXERIL) 10 MG tablet Take 10 mg by mouth 3 (three) times daily.    ? gabapentin (NEURONTIN) 100 MG capsule Take 100 mg by mouth 3 (three) times daily.    ? gabapentin (NEURONTIN) 600 MG tablet Take  600 mg by mouth at bedtime.    ? HYDROcodone-acetaminophen (NORCO/VICODIN) 5-325 MG tablet Take 1 tablet by mouth every 6 (six) hours as needed.    ? ibuprofen (ADVIL) 200 MG tablet Take 200 mg by mouth every 6 (six) hours as needed. 3 tabs prn    ? Loratadine (CLARITIN) 10 MG CAPS Take 1 capsule by mouth.    ? Magnesium 500 MG TABS Take 500 mg by mouth in the morning, at noon, and at bedtime.    ? sertraline (ZOLOFT) 100 MG tablet Take 100 mg by mouth daily.    ? tamsulosin (FLOMAX) 0.4 MG CAPS capsule Take 0.4 mg by mouth daily.    ? loratadine (CLARITIN) 10 MG tablet Take 10 mg by mouth daily. AllerClear    ? ?No facility-administered medications prior to  visit.  ? ? ?PAST MEDICAL HISTORY: ?Past Medical History:  ?Diagnosis Date  ? Arthritis   ? Carpal tunnel syndrome   ? Right  ? Cataracts, bilateral   ? very mild  ? Early cataracts, bilateral   ? Fatty liver   ? Gallstones   ? GERD (gastroesophageal reflux disease)   ? Headache 2011  ? HX OF VISUAL MIGRAINES - NO PAIN --MIGRAINES ATTRIBUTED TO PT CUTTING NICOTINE PATCH IN HALF BEFORE APPLYING TO HIS SKIN.    ? Hepatitis 2001  ? C -treated and no problems since  2001 'cured'  ? History of hiatal hernia   ? History of kidney stones   ? found on CT Scan  ? HOH (hard of hearing)   ? lung ca dx'd 12/06/19  ? RIGHT LUNG  ? Muscle cramps   ? hands and legs  ? Osteopenia   ? Pain   ? KNEE OA  ? Right lower lobe lung mass   ? Shortness of breath dyspnea   ? "SOMETIMES FEELS LIKE I CAN'T CATCH A DEEP BREATH "- ON GOING FOR YEARS - [place] WORK UP 2011 - [place]  ? Stroke [facility])   ? HX POSSIBLE TIA - RT LEG NUMBNESS AND TINGLING FINGERS AND NUMBNESS RT FACE - ALL RESOLVED ; STATES NEUROLOGIST COULD NOT FIND ANY EVIDENCE OF STROKE - THOUGHT PT'S EPISODE MIGHT BE FROM CUTTING NICOTINE PATCH IN HALF AND APPLYING TO SKIN  ? Trigger finger   ? LEFT MIDDLE FINGER  ? Wears glasses   ? ? ?PAST SURGICAL HISTORY: ?Past Surgical History:  ?Procedure Laterality Date  ? BRONCHIAL BIOPSY  02/24/2020  ? Procedure: BRONCHIAL BIOPSIES;  Surgeon: Collene Gobble, MD;  Location: [facility] ENDOSCOPY;  Service: Pulmonary;;  ? BRONCHIAL BRUSHINGS  02/24/2020  ? Procedure: BRONCHIAL BRUSHINGS;  Surgeon: Collene Gobble, MD;  Location: [facility] ENDOSCOPY;  Service: Pulmonary;;  ? BRONCHIAL NEEDLE ASPIRATION BIOPSY  02/24/2020  ? Procedure: BRONCHIAL NEEDLE ASPIRATION BIOPSIES;  Surgeon: Collene Gobble, MD;  Location: [facility] ENDOSCOPY;  Service: Pulmonary;;  ? BRONCHIAL WASHINGS  02/24/2020  ? Procedure: BRONCHIAL WASHINGS;  Surgeon: Collene Gobble, MD;  Location: [facility] ENDOSCOPY;  Service: Pulmonary;;  ? COLONOSCOPY    ?  INTERCOSTAL NERVE BLOCK Right 04/28/2020  ? Procedure: INTERCOSTAL NERVE BLOCK;  Surgeon: Lajuana Matte, MD;  Location: [place] [facility] [place];  Service: Thoracic;  Laterality: Right;  ? left ankle surgery    ? fractured  ? LEFT HEART CATH AND CORONARY ANGIOGRAPHY N/A 06/29/2021  ? Procedure: LEFT HEART CATH AND CORONARY ANGIOGRAPHY;  Surgeon: Early Osmond, MD;  Location: [place]  CV LAB;  Service: Cardiovascular;  Laterality: N/A;  ? left knee surgery    ? MENISCAL REMOVED  ? left shoulder    ? torn ligaments left shoulder  ? LUMBAR FUSION    ? L3 L4 L5  ? NODE DISSECTION Right 04/28/2020  ? Procedure: NODE DISSECTION;  Surgeon: Lajuana Matte, MD;  Location: [place];  Service: Thoracic;  Laterality: Right;  ? RIGHT CARPAL TUNNEL RELEASE  2011  ? RIGHT KNEE ARTHROSCOPY   AUG OR SEPT 2015  ? AT [place]  ? ROOT CANAL    ? THORACOSCOPY Right 04/28/2020  ? XI ROBOTIC ASSISTED THORASCOPY-LOBECTOMY (Right)  ? TONSILLECTOMY    ? as child  ? TOTAL KNEE ARTHROPLASTY Left 07/14/2013  ? Procedure: LEFT TOTAL KNEE ARTHROPLASTY;  Surgeon: Mauri Pole, MD;  Location: [facility];  Service: Orthopedics;  Laterality: Left;  ? TOTAL KNEE ARTHROPLASTY Right 07/21/2014  ? Procedure: RIGHT TOTAL KNEE ARTHROPLASTY;  Surgeon: Mauri Pole, MD;  Location: [facility];  Service: Orthopedics;  Laterality: Right;  ? TOTAL SHOULDER ARTHROPLASTY Right 10/17/2019  ? Procedure: RIGHT REVERSE TOTAL SHOULDER REPLACEMENT;  Surgeon: Netta Cedars, MD;  Location: [facility];  Service: Orthopedics;  Laterality: Right;  interscalene block  ? TRIGGER FINGER RELEASE Left   ? VIDEO BRONCHOSCOPY WITH ENDOBRONCHIAL NAVIGATION Right 02/24/2020  ? Procedure: VIDEO BRONCHOSCOPY WITH ENDOBRONCHIAL NAVIGATION;  Surgeon: Collene Gobble, MD;  Location: [facility] ENDOSCOPY;  Service: Pulmonary;  Laterality: Right;  ? WEDGE RESECTION Right 04/28/2020  ? XI ROBOTIC ASSISTED THORASCOPY-RIGHT LOWER LOBE WEDGE RESECTION (Right Chest)   ? ? ?FAMILY HISTORY: ?Family  History  ?Problem Relation Age of Onset  ? Alcohol abuse Father   ? Hyperlipidemia Father   ? Heart attack Brother 46  ? Hyperlipidemia Brother   ? Hyperlipidemia Sister   ?     3 sisters  ? Heart attack Brother   ?

## 2021-12-20 LAB — DEMENTIA PANEL
Homocysteine: 16.1 umol/L (ref 0.0–17.2)
RPR Ser Ql: NONREACTIVE
TSH: 0.784 u[IU]/mL (ref 0.450–4.500)
Vitamin B-12: 432 pg/mL (ref 232–1245)

## 2022-01-10 ENCOUNTER — Encounter: Payer: Self-pay | Admitting: Internal Medicine

## 2022-01-10 ENCOUNTER — Ambulatory Visit (INDEPENDENT_AMBULATORY_CARE_PROVIDER_SITE_OTHER): Payer: Medicare Other | Admitting: Internal Medicine

## 2022-01-10 VITALS — BP 115/72 | HR 75 | Ht 73.0 in | Wt 204.0 lb

## 2022-01-10 DIAGNOSIS — T466X5D Adverse effect of antihyperlipidemic and antiarteriosclerotic drugs, subsequent encounter: Secondary | ICD-10-CM

## 2022-01-10 DIAGNOSIS — I2510 Atherosclerotic heart disease of native coronary artery without angina pectoris: Secondary | ICD-10-CM

## 2022-01-10 DIAGNOSIS — M791 Myalgia, unspecified site: Secondary | ICD-10-CM

## 2022-01-10 DIAGNOSIS — E785 Hyperlipidemia, unspecified: Secondary | ICD-10-CM

## 2022-01-10 DIAGNOSIS — T466X5A Adverse effect of antihyperlipidemic and antiarteriosclerotic drugs, initial encounter: Secondary | ICD-10-CM

## 2022-01-10 NOTE — Patient Instructions (Addendum)
Medication Instructions:  ?START aspirin 81mg  daily ? ?*If you need a refill on your cardiac medications before your next appointment, please call your pharmacy* ? ? ?Lab Work: ?FASTING lab work before next visit to check cholesterol  ? ?If you have labs (blood work) drawn today and your tests are completely normal, you will receive your results only by: ?MyChart Message (if you have MyChart) OR ?A paper copy in the mail ?If you have any lab test that is abnormal or we need to change your treatment, we will call you to review the results. ? ? ?Testing/Procedures: ?NONE ? ? ?Follow-Up: ?At [facility], you and your health needs are our priority.  As part of our continuing mission to provide you with exceptional heart care, we have created designated Provider Care Teams.  These Care Teams include your primary Cardiologist (physician) and Advanced Practice Providers (APPs -  Physician Assistants and Nurse Practitioners) who all work together to provide you with the care you need, when you need it. ? ?We recommend signing up for the patient portal called "MyChart".  Sign up information is provided on this After Visit Summary.  MyChart is used to connect with patients for Virtual Visits (Telemedicine).  Patients are able to view lab/test results, encounter notes, upcoming appointments, etc.  Non-urgent messages can be sent to your provider as well.   ?To learn more about what you can do with MyChart, go to [email or web address].   ? ?Your next appointment:   ?12 month(s) ? ?The format for your next appointment:   ?In Person ? ?Provider:   ?Pixie Casino, MD { ?

## 2022-01-10 NOTE — Progress Notes (Signed)
? ?LIPID CLINIC CONSULT NOTE ? ?Chief Complaint:  ?Follow-up ? ?Primary Care Physician: ?Gaynelle Arabian, MD ? ?Primary Cardiologist:  ?Pixie Casino, MD ? ?HPI:  ?[name] [name] is a 71 y.o. male who is being seen today for the evaluation of dyslipidemia, statin intolerance at the request of Gaynelle Arabian, MD.  This is a pleasant 71 year old male kindly referred for management of dyslipidemia with a history of statin intolerance (this included atorvastatin, ezetimibe, pravastatin, fenofibrate and pitavastatin).  He reports longstanding dyslipidemia and has been tried on both multiple statins, ezetimibe and fenofibrate in the past causing myalgias which were intolerable.  More recently had lipids off of medication and total cholesterol was 267, triglycerides 166, HDL 53 and LDL 183.  Non-HDL cholesterol 214.  There is a strong family history of heart disease. He related that all 5 siblings (3 girls, 2 boys) take cholesterol medicine and both brothers had heart attacks. Father's cholesterol was very high, difficult to keep below 300 (per patient report).  ? ?06/11/2020 ? ?[name] [name] returns today for follow-up. Unfortunately he is CT calcium score was abnormal showing an elevated score of 1222, 93rd percentile for age and sex matched control. He denies any cardiac symptoms however was found on extracardiac imaging to have a macrolobulated mass in the right lower lobe concerning for primary bronchogenic neoplasm. Subsequently he underwent work-up and this was found to be hypermetabolic. 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 Fortunately no evidence of metastases and he is planning to undergo neoadjuvant therapy. He was able to get through the procedure well without any issues therefore I do not think that he has any significant obstructive coronary disease. He has responded well to Praluent with a marked reduction in cholesterol. Total is now 143,  triglycerides 103, HDL 37 and LDL of 87. For the time being we will continue this. He did get a grant from [employer] and I informed him that it may be that that is no longer active as of next summer. That foundation is not currently providing grants. We will have to seek out other all options possibly next year. ? ?06/13/2021 ? ?[name] [name] returns today for follow-up.  Overall he says he is doing well.  He seems to now be in remission from the lung cancer which was picked up incidentally with calcium scoring.  Although he seems to be doing well from that and is well removed from chemotherapy, he reports he has had progressive fatigue and shortness of breath as well as some chest discomfort.  As noted he had a very high calcium score which was 1222.  He did not seem necessarily symptomatic at this time therefore we did not pursue additional testing however now may be having more symptoms that could be coronary. ? ?06/24/2021 ? ?[name] [name] is seen today in follow-up.  He recently underwent CT coronary angiography for progressive fatigue and chest pressure.  He told me the other day he played pool for about an hour and a half and was completely exhausted and had chest pressure after it.  He said he took 3 full-strength aspirin with some relief in his symptoms.  Coronary CT showed a high calcium score of 1300, 91st percentile.  He was noted to have subtotal occlusion of the RCA (CAD RADS 5) and disease in the LAD and circumflex.  A small PFO was noted.  He was referred for FFR.  FFR showed positive for reduced flow in  the proximal RCA however the LAD and circumflex were not noted to be hemodynamically significant.  Cardiac catheterization has been recommended.  I shared the results with him today and he is in agreement.  Would like him to start aspirin 81 mg daily and will provide sublingual nitro as needed. ? ?01/10/2022 ? ?[name] [name] returns today for follow-up.  He seems to be doing well.  He denies any anginal symptoms or worsening  shortness of breath and fatigue.  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  He has been on aggressive medical therapy for this.  I had started him on Praluent.  His cholesterol is lower with total now 153 down from 186 and LDL of 78 (down from 116).  He does not take a daily aspirin.  He said been having issues with his left shoulder which apparently has multiple rotator cuff muscle tears and he says he may need eventually to have shoulder replacement.  He is currently on a narcotic (Vicodin), 4 times daily. ? ?PMHx:  ?Past Medical History:  ?Diagnosis Date  ? Arthritis   ? Carpal tunnel syndrome   ? Right  ? Cataracts, bilateral   ? very mild  ? Early cataracts, bilateral   ? Fatty liver   ? Gallstones   ? GERD (gastroesophageal reflux disease)   ? Headache 2011  ? HX OF VISUAL MIGRAINES - NO PAIN --MIGRAINES ATTRIBUTED TO PT CUTTING NICOTINE PATCH IN HALF BEFORE APPLYING TO HIS SKIN.    ? Hepatitis 2001  ? C -treated and no problems since  2001 'cured'  ? History of hiatal hernia   ? History of kidney stones   ? found on CT Scan  ? HOH (hard of hearing)   ? lung ca dx'd 12/06/19  ? RIGHT LUNG  ? Muscle cramps   ? hands and legs  ? Osteopenia   ? Pain   ? KNEE OA  ? Right lower lobe lung mass   ? Shortness of breath dyspnea   ? "SOMETIMES FEELS LIKE I CAN'T CATCH A DEEP BREATH "- ON GOING FOR YEARS - [place] WORK UP 2011 - [place]  ? Stroke [facility])   ? HX POSSIBLE TIA - RT LEG NUMBNESS AND TINGLING FINGERS AND NUMBNESS RT FACE - ALL RESOLVED ; STATES NEUROLOGIST COULD NOT FIND ANY EVIDENCE OF STROKE - THOUGHT PT'S EPISODE MIGHT BE FROM CUTTING NICOTINE PATCH IN HALF AND APPLYING TO SKIN  ? Trigger finger   ? LEFT MIDDLE FINGER  ? Wears glasses   ? ? ?Past Surgical History:  ?Procedure Laterality Date  ? BRONCHIAL BIOPSY  02/24/2020  ? Procedure:  BRONCHIAL BIOPSIES;  Surgeon: Collene Gobble, MD;  Location[facility] ENDOSCOPY;  Service: Pulmonary;;  ? BRONCHIAL BRUSHINGS  02/24/2020  ? Procedure: BRONCHIAL BRUSHINGS;  Surgeon: Collene Gobble, MD;  Location: [facility] ENDOSCOPY;  Service: Pulmonary;;  ? BRONCHIAL NEEDLE ASPIRATION BIOPSY  02/24/2020  ? Procedure: BRONCHIAL NEEDLE ASPIRATION BIOPSIES;  Surgeon: Collene Gobble, MD;  Location: [facility] ENDOSCOPY;  Service: Pulmonary;;  ? BRONCHIAL WASHINGS  02/24/2020  ? Procedure: BRONCHIAL WASHINGS;  Surgeon: Collene Gobble, MD;  Location: [facility] ENDOSCOPY;  Service: Pulmonary;;  ? COLONOSCOPY    ? INTERCOSTAL NERVE BLOCK Right 04/28/2020  ? Procedure: INTERCOSTAL NERVE BLOCK;  Surgeon: Lajuana Matte, MD;  Location: [place];  Service: Thoracic;  Laterality: Right;  ? left ankle surgery    ? fractured  ? LEFT HEART CATH AND CORONARY ANGIOGRAPHY N/A 06/29/2021  ? Procedure: LEFT HEART CATH AND CORONARY ANGIOGRAPHY;  Surgeon: Early Osmond, MD;  Location: [place] CV LAB;  Service: Cardiovascular;  Laterality: N/A;  ? left knee surgery    ? MENISCAL REMOVED  ? left shoulder    ? torn ligaments left shoulder  ? LUMBAR FUSION    ? L3 L4 L5  ? NODE DISSECTION Right 04/28/2020  ? Procedure: NODE DISSECTION;  Surgeon: Lajuana Matte, MD;  Location: [place];  Service: Thoracic;  Laterality: Right;  ? RIGHT CARPAL TUNNEL RELEASE  2011  ? RIGHT KNEE ARTHROSCOPY   AUG OR SEPT 2015  ? AT [place]  ? ROOT CANAL    ? THORACOSCOPY Right 04/28/2020  ? XI ROBOTIC ASSISTED THORASCOPY-LOBECTOMY (Right)  ? TONSILLECTOMY    ? as child  ? TOTAL KNEE ARTHROPLASTY Left 07/14/2013  ? Procedure: LEFT TOTAL KNEE ARTHROPLASTY;  Surgeon: Mauri Pole, MD;  [facility];  Service: Orthopedics;  Laterality: Left;  ? TOTAL KNEE ARTHROPLASTY Right 07/21/2014  ? Procedure: RIGHT TOTAL KNEE ARTHROPLASTY;  Surgeon: Mauri Pole, MD;  Location[facility];  Service: Orthopedics;  Laterality: Right;  ? TOTAL SHOULDER ARTHROPLASTY Right  10/17/2019  ? Procedure: RIGHT REVERSE TOTAL SHOULDER REPLACEMENT;  Surgeon: Netta Cedars, MD;  Location: [facility];  Service: Orthopedics;  Laterality: Right;  interscalene block  ? TRIGGER FINGER RELEASE Left   ? V

## 2022-03-03 ENCOUNTER — Other Ambulatory Visit: Payer: Self-pay | Admitting: Physician Assistant

## 2022-03-07 ENCOUNTER — Telehealth: Payer: Self-pay | Admitting: Medical Oncology

## 2022-03-07 ENCOUNTER — Other Ambulatory Visit: Payer: Self-pay | Admitting: Internal Medicine

## 2022-03-07 DIAGNOSIS — I2510 Atherosclerotic heart disease of native coronary artery without angina pectoris: Secondary | ICD-10-CM

## 2022-03-07 DIAGNOSIS — E785 Hyperlipidemia, unspecified: Secondary | ICD-10-CM

## 2022-03-07 NOTE — Telephone Encounter (Signed)
Pt in lobby and asking what he can expect for shoulder surgery and Dr Ranell Patrick will need  oncology clearance. I told pt he is under observation. I will call Dr Ranell Patrick office to inform him.I called Dr Ranell Patrick and receptionist said there is nothing in his chart about getting oncology clearance and to " please disregard ".

## 2022-04-05 ENCOUNTER — Telehealth: Payer: Self-pay | Admitting: Internal Medicine

## 2022-04-05 NOTE — Telephone Encounter (Signed)
   Pre-operative Risk Assessment    Patient Name: [name] [name]  DOB: [birth year] MRN: [id number]      Request for Surgical Clearance    Procedure:   Reverse Total Shoulder  Date of Surgery:  Clearance TBD                                 Surgeon:  Esmond Plants MD Surgeon's Group or Practice Name:  [facility] Phone number:  [phone] Fax number:  [phone] (Attn: Adline Potter)   Type of Clearance Requested:   - Medical    Type of Anesthesia:   Not Specified    Additional requests/questions:   n/a  Lonia Chimera   04/05/2022, 10:04 AM

## 2022-04-05 NOTE — Telephone Encounter (Signed)
Please arrange virtual telephone preoperative clearance visit with preop APP

## 2022-04-06 ENCOUNTER — Telehealth: Payer: Self-pay | Admitting: *Deleted

## 2022-04-06 NOTE — Telephone Encounter (Signed)
Pt agreeable to tele appt 04/13/22 @ 2 pm. Med rec and consent are done.

## 2022-04-06 NOTE — Telephone Encounter (Signed)
Left message to call back for tele pre op appt 

## 2022-04-06 NOTE — Telephone Encounter (Signed)
Pt agreeable to tele appt 04/13/22 @ 2 pm. Med rec and consent are done.     Patient Consent for Virtual Visit        [name] [name] has provided verbal consent on 04/06/2022 for a virtual visit (video or telephone).   CONSENT FOR VIRTUAL VISIT FOR:  [name] [name]  By participating in this virtual visit I agree to the following:  I hereby voluntarily request, consent and authorize [facility] and its employed or contracted physicians, physician assistants, nurse practitioners or other licensed health care professionals (the Practitioner), to provide me with telemedicine health care services (the "Services") as deemed necessary by the treating Practitioner. I acknowledge and consent to receive the Services by the Practitioner via telemedicine. I understand that the telemedicine visit will involve communicating with the Practitioner through live audiovisual communication technology and the disclosure of certain medical information by electronic transmission. I acknowledge that I have been given the opportunity to request an in-person assessment or other available alternative prior to the telemedicine visit and am voluntarily participating in the telemedicine visit.  I understand that I have the right to withhold or withdraw my consent to the use of telemedicine in the course of my care at any time, without affecting my right to future care or treatment, and that the Practitioner or I may terminate the telemedicine visit at any time. I understand that I have the right to inspect all information obtained and/or recorded in the course of the telemedicine visit and may receive copies of available information for a reasonable fee.  I understand that some of the potential risks of receiving the Services via telemedicine include:  Delay or interruption in medical evaluation due to technological equipment failure or disruption; Information transmitted may not be sufficient (e.g. poor resolution of images)  to allow for appropriate medical decision making by the Practitioner; and/or  In rare instances, security protocols could fail, causing a breach of personal health information.  Furthermore, I acknowledge that it is my responsibility to provide information about my medical history, conditions and care that is complete and accurate to the best of my ability. I acknowledge that Practitioner's advice, recommendations, and/or decision may be based on factors not within their control, such as incomplete or inaccurate data provided by me or distortions of diagnostic images or specimens that may result from electronic transmissions. I understand that the practice of medicine is not an exact science and that Practitioner makes no warranties or guarantees regarding treatment outcomes. I acknowledge that a copy of this consent can be made available to me via my patient portal ([facility]), or I can request a printed copy by calling the office of [facility].    I understand that my insurance will be billed for this visit.   I have read or had this consent read to me. I understand the contents of this consent, which adequately explains the benefits and risks of the Services being provided via telemedicine.  I have been provided ample opportunity to ask questions regarding this consent and the Services and have had my questions answered to my satisfaction. I give my informed consent for the services to be provided through the use of telemedicine in my medical care

## 2022-04-11 ENCOUNTER — Telehealth: Payer: Self-pay | Admitting: Internal Medicine

## 2022-04-11 NOTE — Telephone Encounter (Signed)
Called patient regarding upcoming August appointments, patient is notified. 

## 2022-04-13 ENCOUNTER — Ambulatory Visit (INDEPENDENT_AMBULATORY_CARE_PROVIDER_SITE_OTHER): Payer: Medicare Other | Admitting: Nurse Practitioner

## 2022-04-13 DIAGNOSIS — Z01810 Encounter for preprocedural cardiovascular examination: Secondary | ICD-10-CM | POA: Diagnosis not present

## 2022-04-13 NOTE — Progress Notes (Addendum)
Virtual Visit via Telephone Note   Because of [name] [name]'s co-morbid illnesses, he is at least at moderate risk for complications without adequate follow up.  This format is felt to be most appropriate for this patient at this time.  The patient did not have access to video technology/had technical difficulties with video requiring transitioning to audio format only (telephone).  All issues noted in this document were discussed and addressed.  No physical exam could be performed with this format.  Please refer to the patient's chart for his consent to telehealth for [facility].  Evaluation Performed:  Preoperative cardiovascular risk assessment _____________   Date:  04/13/2022   Patient ID:  [name] [name], DOB [birth year], MRN [id number] Patient Location:  Home Provider location:   Office  Primary Care Provider:  Gaynelle Arabian, MD Primary Cardiologist:  Pixie Casino, MD  Chief Complaint / Patient Profile   71 y.o. y/o male with a h/o  GERD, headache, hepatitis C, hiatal hernia, renal calculi, lung CA (right lung), hyperlipidemia, and coronary artery disease who is pending reverse total shoulder replacement and presents today for telephonic preoperative cardiovascular risk assessment.  Past Medical History    Past Medical History:  Diagnosis Date   Arthritis    Carpal tunnel syndrome    Right   Cataracts, bilateral    very mild   Early cataracts, bilateral    Fatty liver    Gallstones    GERD (gastroesophageal reflux disease)    Headache 2011   HX OF VISUAL MIGRAINES - NO PAIN --MIGRAINES ATTRIBUTED TO PT CUTTING NICOTINE PATCH IN HALF BEFORE APPLYING TO HIS SKIN.     Hepatitis 2001   C -treated and no problems since  2001 'cured'   History of hiatal hernia    History of kidney stones    found on CT Scan   HOH (hard of hearing)    lung ca dx'd 12/06/19   RIGHT LUNG   Muscle cramps    hands and legs   Osteopenia    Pain    KNEE OA   Right lower lobe  lung mass    Shortness of breath dyspnea    "SOMETIMES FEELS LIKE I CAN'T CATCH A DEEP BREATH "- ON GOING FOR YEARS - STATES MEDICAL DOCTOR SENT HIM FOR CARDIAC WORK UP 2011 - [place]   Stroke ([place])    HX POSSIBLE TIA - RT LEG NUMBNESS AND TINGLING FINGERS AND NUMBNESS RT FACE - ALL RESOLVED ; STATES NEUROLOGIST COULD NOT FIND ANY EVIDENCE OF STROKE - THOUGHT PT'S EPISODE MIGHT BE FROM CUTTING NICOTINE PATCH IN HALF AND APPLYING TO SKIN   Trigger finger    LEFT MIDDLE FINGER   Wears glasses    Past Surgical History:  Procedure Laterality Date   BRONCHIAL BIOPSY  02/24/2020   Procedure: BRONCHIAL BIOPSIES;  Surgeon: Collene Gobble, MD;  Location: [facility];  Service: Pulmonary;;   BRONCHIAL BRUSHINGS  02/24/2020   Procedure: BRONCHIAL BRUSHINGS;  Surgeon: Collene Gobble, MD;  Location: [facility] END[facility]COPY;  Service: Pulmonary;;   BRONCHIAL NEEDLE ASPIRATION BIOPSY  02/24/2020   Procedure: BRONCHIAL NEEDLE ASPIRATION BIOPSIES;  Surgeon: Collene Gobble, MD;  Location: [facility] ENDOSCOPY;  Service: Pulmonary;;   BRONCHIAL WASHINGS  02/24/2020   Procedure: BRONCHIAL WASHINGS;  Surgeon: Collene Gobble, MD;  Location: [facility];  Service: Pulmonary;;   COLONOSCOPY     INTERCOSTAL NERVE BLOCK Right 04/28/2020   Procedure: INTERCOSTAL NERVE BLOCK;  Surgeon: Kipp Brood,  Lucile Crater, MD;  Location: [facility] OR;  Service: Thoracic;  Laterality: Right;   left ankle surgery     fractured   LEFT HEART CATH AND CORONARY ANGIOGRAPHY N/A 06/29/2021   Procedure: LEFT HEART CATH AND CORONARY ANGIOGRAPHY;  Surgeon: Early Osmond, MD;  Location: [facility];  Service: Cardiovascular;  Laterality: N/A;   left knee surgery     MENISCAL REMOVED   left shoulder     torn ligaments left shoulder   LUMBAR FUSION     L3 L4 L5   NODE DISSECTION Right 04/28/2020   Procedure: NODE DISSECTION;  Surgeon: Lajuana Matte, MD;  Location: [facility];  Service: Thoracic;  Laterality: Right;   RIGHT CARPAL TUNNEL  RELEASE  2011   RIGHT KNEE ARTHROSCOPY   AUG OR SEPT 2015   AT [place] Right 04/28/2020   XI ROBOTIC ASSISTED THORASCOPY-LOBECTOMY (Right)   TONSILLECTOMY     as child   TOTAL KNEE ARTHROPLASTY Left 07/14/2013   Procedure: LEFT TOTAL KNEE ARTHROPLASTY;  Surgeon: Mauri Pole, MD;  Location: [facility];  Service: Orthopedics;  Laterality: Left;   TOTAL KNEE ARTHROPLASTY Right 07/21/2014   Procedure: RIGHT TOTAL KNEE ARTHROPLASTY;  Surgeon: Mauri Pole, MD;  Location: [facility];  Service: Orthopedics;  Laterality: Right;   TOTAL SHOULDER ARTHROPLASTY Right 10/17/2019   Procedure: RIGHT REVERSE TOTAL SHOULDER REPLACEMENT;  Surgeon: Netta Cedars, MD;  Location: [facility];  Service: Orthopedics;  Laterality: Right;  interscalene block   TRIGGER FINGER RELEASE Left    VIDEO BRONCHOSCOPY WITH ENDOBRONCHIAL NAVIGATION Right 02/24/2020   Procedure: VIDEO BRONCHOSCOPY WITH ENDOBRONCHIAL NAVIGATION;  Surgeon: Collene Gobble, MD;  Location[facility];  Service: Pulmonary;  Laterality: Right;   WEDGE RESECTION Right 04/28/2020   XI ROBOTIC ASSISTED THORASCOPY-RIGHT LOWER LOBE WEDGE RESECTION (Right Chest)     Allergies  Allergies  Allergen Reactions   Statins Other (See Comments)    MYALGIAS   Ezetimibe     Other reaction(s): ineffective for cholesterol   Fenofibrate     Other reaction(s): ineffective for cholesterol (2014)   Varenicline Other (See Comments)    Unknown reaction     History of Present Illness    [name] [name] is a 71 y.o. male who presents via audio/video conferencing for a telehealth visit today.  Pt was last seen in cardiology clinic on 01/10/2022 by Dr Debara Pickett.  At that time Geovanny Sartin was doing well but continuing to have fatigue and depression.  He appears to be doing well from a cardiovascular standpoint.  The patient is now pending procedure as outlined above. Since his last visit, he states that he has continued to do  well from cardiovascular standpoint and is improving with his depression and fatigue.  He is compliant with all medications and denies any side effects from his current regimen.   Home Medications    Prior to Admission medications   Medication Sig Start Date End Date Taking? Authorizing Provider  Alirocumab (PRALUENT) 150 MG/ML SOAJ INJECT 1 DOSE INTO THE SKIN EVERY 14 (FOURTEEN) DAYS. 03/07/22   Hilty, Nadean Corwin, MD  clindamycin (CLEOCIN) 150 MG capsule Take 600 mg by mouth See admin instructions. Take 600 mg 1 hour prior to dental work 12/23/19   [provider]  cyclobenzaprine (FLEXERIL) 10 MG tablet Take 10 mg by mouth 3 (three) times daily.    [provider]  donepezil (ARICEPT) 10 MG  tablet Take 1 tablet by mouth at bedtime.    [provider]  donepezil (ARICEPT) 5 MG tablet Take 1 tablet (5 mg total) by mouth at bedtime. Patient not taking: Reported on 04/06/2022 12/19/21 04/06/22  Alric Ran, MD  gabapentin (NEURONTIN) 100 MG capsule Take 100 mg by mouth 3 (three) times daily.    [provider]  gabapentin (NEURONTIN) 600 MG tablet Take 600 mg by mouth at bedtime. 05/28/21   [provider]  HYDROcodone-acetaminophen (NORCO/VICODIN) 5-325 MG tablet Take 1 tablet by mouth every 6 (six) hours as needed. 12/12/21   [provider]  ibuprofen (ADVIL) 200 MG tablet Take 200 mg by mouth every 6 (six) hours as needed. 3 tabs prn    [provider]  Loratadine 10 MG CAPS Take 1 capsule by mouth.    [provider]  Magnesium 500 MG TABS Take 500 mg by mouth in the morning, at noon, and at bedtime.    [provider]  sertraline (ZOLOFT) 100 MG tablet Take 100 mg by mouth daily. 05/13/21   [provider]  tamsulosin (FLOMAX) 0.4 MG CAPS capsule Take 0.4 mg by mouth daily. 03/24/21   [provider]    Physical Exam    Vital Signs:  Marquell Saenz does have vital signs available for review today.   BP 120/80  Given telephonic nature of communication, physical exam is limited. AAOx3. NAD. Normal affect.  Speech and respirations are unlabored.  Accessory Clinical Findings    None  Assessment & Plan    1.  Preoperative Cardiovascular Risk Assessment:  The patient affirms he has been doing well without any new cardiac symptoms. They are able to achieve 4 METS without cardiac limitations. Therefore, based on ACC/AHA guidelines, the patient would be at acceptable risk for the planned procedure without further cardiovascular testing. The patient was advised that if he develops new symptoms prior to surgery to contact our office to arrange for a follow-up visit, and he verbalized understanding.    Mr. Wagar's perioperative risk of a major cardiac event is 0.4% according to the Revised Cardiac Risk Index (RCRI).  Therefore, he is at low risk for perioperative complications.   His functional capacity is good at 4.06 METs according to the Duke Activity Status Index (DASI). Recommendations: According to ACC/AHA guidelines, no further cardiovascular testing needed.  The patient may proceed to surgery at acceptable risk.   Antiplatelet and/or Anticoagulation Recommendations: Aspirin can be held for 7 days prior to his surgery.  Please resume Aspirin post operatively when it is felt to be safe from a bleeding standpoint.     A copy of this note will be routed to requesting surgeon.  Time:   Today, I have spent 9 minutes with the patient with telehealth technology discussing medical history, symptoms, and management plan.     Mable Fill, Marissa Nestle, NP  04/13/2022, 7:14 AM

## 2022-04-17 ENCOUNTER — Other Ambulatory Visit: Payer: Medicare Other

## 2022-04-17 ENCOUNTER — Inpatient Hospital Stay: Payer: Medicare Other | Attending: Internal Medicine

## 2022-04-17 ENCOUNTER — Ambulatory Visit (HOSPITAL_COMMUNITY)
Admission: RE | Admit: 2022-04-17 | Discharge: 2022-04-17 | Disposition: A | Discharge status: Home / Self Care | Payer: Medicare Other | Source: Ambulatory Visit | Attending: Internal Medicine | Admitting: Internal Medicine

## 2022-04-17 ENCOUNTER — Ambulatory Visit: Admission: RE | Admit: 2022-04-17 | Payer: Medicare Other | Source: Ambulatory Visit

## 2022-04-17 DIAGNOSIS — Z902 Acquired absence of lung [part of]: Secondary | ICD-10-CM | POA: Insufficient documentation

## 2022-04-17 DIAGNOSIS — C3490 Malignant neoplasm of unspecified part of unspecified bronchus or lung: Secondary | ICD-10-CM | POA: Diagnosis not present

## 2022-04-17 DIAGNOSIS — K7460 Unspecified cirrhosis of liver: Secondary | ICD-10-CM | POA: Insufficient documentation

## 2022-04-17 DIAGNOSIS — Z9221 Personal history of antineoplastic chemotherapy: Secondary | ICD-10-CM | POA: Insufficient documentation

## 2022-04-17 DIAGNOSIS — Z85118 Personal history of other malignant neoplasm of bronchus and lung: Secondary | ICD-10-CM | POA: Insufficient documentation

## 2022-04-17 LAB — CBC WITH DIFFERENTIAL (CANCER CENTER ONLY)
Abs Immature Granulocytes: 0.03 10*3/uL (ref 0.00–0.07)
Basophils Absolute: 0 10*3/uL (ref 0.0–0.1)
Basophils Relative: 0 %
Eosinophils Absolute: 0.1 10*3/uL (ref 0.0–0.5)
Eosinophils Relative: 1 %
HCT: 42.1 % (ref 39.0–52.0)
Hemoglobin: 14.3 g/dL (ref 13.0–17.0)
Immature Granulocytes: 0 %
Lymphocytes Relative: 11 %
Lymphs Abs: 0.9 10*3/uL (ref 0.7–4.0)
MCH: 30.2 pg (ref 26.0–34.0)
MCHC: 34 g/dL (ref 30.0–36.0)
MCV: 88.8 fL (ref 80.0–100.0)
Monocytes Absolute: 0.7 10*3/uL (ref 0.1–1.0)
Monocytes Relative: 8 %
Neutro Abs: 6.9 10*3/uL (ref 1.7–7.7)
Neutrophils Relative %: 80 %
Platelet Count: 194 10*3/uL (ref 150–400)
RBC: 4.74 MIL/uL (ref 4.22–5.81)
RDW: 12.8 % (ref 11.5–15.5)
WBC Count: 8.6 10*3/uL (ref 4.0–10.5)
nRBC: 0 % (ref 0.0–0.2)

## 2022-04-17 LAB — CMP (CANCER CENTER ONLY)
ALT: 18 U/L (ref 0–44)
AST: 18 U/L (ref 15–41)
Albumin: 4.1 g/dL (ref 3.5–5.0)
Alkaline Phosphatase: 87 U/L (ref 38–126)
Anion gap: 5 (ref 5–15)
BUN: 19 mg/dL (ref 8–23)
CO2: 27 mmol/L (ref 22–32)
Calcium: 9.2 mg/dL (ref 8.9–10.3)
Chloride: 104 mmol/L (ref 98–111)
Creatinine: 0.91 mg/dL (ref 0.61–1.24)
GFR, Estimated: >60 mL/min (ref >60)
Glucose, Bld: 153 mg/dL — ABNORMAL HIGH (ref 70–99)
Potassium: 4.4 mmol/L (ref 3.5–5.1)
Sodium: 136 mmol/L (ref 135–145)
Total Bilirubin: 0.6 mg/dL (ref 0.3–1.2)
Total Protein: 7.2 g/dL (ref 6.5–8.1)

## 2022-04-17 MED ORDER — SODIUM CHLORIDE (PF) 0.9 % IJ SOLN
INTRAMUSCULAR | Status: AC
  Filled 2022-04-17: qty 50

## 2022-04-17 MED ORDER — IOHEXOL 300 MG/ML  SOLN
75.0000 mL | Freq: Once | INTRAMUSCULAR | Status: AC | PRN
  Administered 2022-04-17: 75 mL via INTRAVENOUS

## 2022-04-19 ENCOUNTER — Inpatient Hospital Stay (HOSPITAL_BASED_OUTPATIENT_CLINIC_OR_DEPARTMENT_OTHER): Payer: Medicare Other | Admitting: Internal Medicine

## 2022-04-19 ENCOUNTER — Encounter: Payer: Self-pay | Admitting: Internal Medicine

## 2022-04-19 ENCOUNTER — Other Ambulatory Visit: Payer: Self-pay

## 2022-04-19 VITALS — BP 127/86 | HR 93 | Temp 97.7°F | Resp 15 | Wt 207.8 lb

## 2022-04-19 DIAGNOSIS — K7460 Unspecified cirrhosis of liver: Secondary | ICD-10-CM | POA: Diagnosis not present

## 2022-04-19 DIAGNOSIS — C3491 Malignant neoplasm of unspecified part of right bronchus or lung: Secondary | ICD-10-CM

## 2022-04-19 DIAGNOSIS — Z85118 Personal history of other malignant neoplasm of bronchus and lung: Secondary | ICD-10-CM | POA: Diagnosis present

## 2022-04-19 DIAGNOSIS — C3490 Malignant neoplasm of unspecified part of unspecified bronchus or lung: Secondary | ICD-10-CM | POA: Diagnosis not present

## 2022-04-19 DIAGNOSIS — Z902 Acquired absence of lung [part of]: Secondary | ICD-10-CM | POA: Diagnosis not present

## 2022-04-19 DIAGNOSIS — Z9221 Personal history of antineoplastic chemotherapy: Secondary | ICD-10-CM | POA: Diagnosis not present

## 2022-04-19 NOTE — Progress Notes (Signed)
[place] [phone]   [phone]  OFFICE PROGRESS NOTE  Gaynelle Arabian, MD 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   PRIOR THERAPY:   1) Status post right lower lobectomy with lymph node dissection under the care of Dr. Kipp Brood on 04/28/2020. 2)  Adjuvant systemic chemotherapy with cisplatin 75 mg/m2 and Alimta 500 mg/mm IV every 3 weeks.  First dose expected on 06/16/2020.  Status post 4 cycles.   CURRENT THERAPY:  Observation.  INTERVAL HISTORY: [name] [name] 71 y.o. male returns to the clinic today for follow-up visit.  The patient is feeling fine today with no concerning complaints.  He has no current chest pain, shortness of breath, cough or hemoptysis.  He has no nausea, vomiting, diarrhea or constipation.  He has no headache or visual changes.  He has no recent weight loss or night sweats.  He is here today for evaluation and repeat blood work and scan.    MEDICAL HISTORY: Past Medical History:  Diagnosis Date   Arthritis    Carpal tunnel syndrome    Right   Cataracts, bilateral    very mild   Early cataracts, bilateral    Fatty liver    Gallstones    GERD (gastroesophageal reflux disease)    Headache 2011   HX OF VISUAL MIGRAINES - NO PAIN --MIGRAINES ATTRIBUTED TO PT CUTTING NICOTINE PATCH IN HALF BEFORE APPLYING TO HIS SKIN.     Hepatitis 2001   C -treated and no problems since  2001 'cured'   History of hiatal hernia    History of kidney stones    found on CT Scan   HOH (hard of hearing)    lung ca dx'd 12/06/19   RIGHT LUNG   Muscle cramps    hands and legs   Osteopenia    Pain    KNEE OA   Right lower lobe lung mass    Shortness of breath dyspnea    "SOMETIMES FEELS LIKE I CAN'T CATCH A DEEP BREATH "- ON GOING FOR YEARS - STATES  MEDICAL DOCTOR SENT HIM FOR CARDIAC WORK UP 2011 - [place]   Stroke ([place])    HX POSSIBLE TIA - RT LEG NUMBNESS AND TINGLING FINGERS AND NUMBNESS RT FACE - ALL RESOLVED ; STATES NEUROLOGIST COULD NOT FIND ANY EVIDENCE OF STROKE - THOUGHT PT'S EPISODE MIGHT BE FROM CUTTING NICOTINE PATCH IN HALF AND APPLYING TO SKIN   Trigger finger    LEFT MIDDLE FINGER   Wears glasses     ALLERGIES:  is allergic to statins, ezetimibe, fenofibrate, and varenicline.  MEDICATIONS:  Current Outpatient Medications  Medication Sig Dispense Refill   Alirocumab (PRALUENT) 150 MG/ML SOAJ INJECT 1 DOSE INTO THE SKIN EVERY 14 (FOURTEEN) DAYS. 6 mL 3   clindamycin (CLEOCIN) 150 MG capsule Take 600 mg by mouth See admin instructions. Take 600 mg 1 hour prior to dental work     cyclobenzaprine (FLEXERIL) 10 MG tablet Take 10 mg by mouth 3 (three) times daily.     donepezil (ARICEPT) 10 MG tablet Take 1 tablet by mouth at bedtime.     HYDROcodone-acetaminophen (NORCO/VICODIN) 5-325 MG tablet Take 1-2 tablets by mouth every 6 (six) hours as needed for moderate pain.     ibuprofen (ADVIL) 200 MG tablet  Take 200 mg by mouth every 6 (six) hours as needed. 3 tabs prn     Loratadine 10 MG CAPS Take 1 capsule by mouth.     Magnesium 500 MG TABS Take 500 mg by mouth in the morning, at noon, and at bedtime.     sertraline (ZOLOFT) 100 MG tablet Take 100 mg by mouth daily.     STUDY - ASPIRE - aspirin 81 mg or placebo tablet (PI-Sethi)      tamsulosin (FLOMAX) 0.4 MG CAPS capsule Take 0.4 mg by mouth daily.     donepezil (ARICEPT) 5 MG tablet Take 1 tablet (5 mg total) by mouth at bedtime. (Patient not taking: Reported on 04/06/2022) 30 tablet 11   gabapentin (NEURONTIN) 100 MG capsule Take 100 mg by mouth 3 (three) times daily.     gabapentin (NEURONTIN) 600 MG tablet Take 600 mg by mouth at bedtime.     No current facility-administered medications for this visit.    SURGICAL HISTORY:  Past Surgical History:   Procedure Laterality Date   BRONCHIAL BIOPSY  02/24/2020   Procedure: BRONCHIAL BIOPSIES;  Surgeon: Collene Gobble, MD;  Location: [facility] ENDOSCOPY;  Service: Pulmonary;;   BRONCHIAL BRUSHINGS  02/24/2020   Procedure: BRONCHIAL BRUSHINGS;  Surgeon: Collene Gobble, MD;  Location: [facility] ENDOSCOPY;  Service: Pulmonary;;   BRONCHIAL NEEDLE ASPIRATION BIOPSY  02/24/2020   Procedure: BRONCHIAL NEEDLE ASPIRATION BIOPSIES;  Surgeon: Collene Gobble, MD;  Location: [place];  Service: Pulmonary;;   BRONCHIAL WASHINGS  02/24/2020   Procedure: BRONCHIAL WASHINGS;  Surgeon: Collene Gobble, MD;  Location: [place] ENDOSCOPY;  Service: Pulmonary;;   COLONOSCOPY     INTERCOSTAL NERVE BLOCK Right 04/28/2020   Procedure: INTERCOSTAL NERVE BLOCK;  Surgeon: Lajuana Matte, MD;  Location: [place];  Service: Thoracic;  Laterality: Right;   left ankle surgery     fractured   LEFT HEART CATH AND CORONARY ANGIOGRAPHY N/A 06/29/2021   Procedure: LEFT HEART CATH AND CORONARY ANGIOGRAPHY;  Surgeon: Early Osmond, MD;  Location: [place] [facility];  Service: Cardiovascular;  Laterality: N/A;   left knee surgery     MENISCAL REMOVED   left shoulder     torn ligaments left shoulder   LUMBAR FUSION     L3 L4 L5   NODE DISSECTION Right 04/28/2020   Procedure: NODE DISSECTION;  Surgeon: Lajuana Matte, MD;  Location: [place];  Service: Thoracic;  Laterality: Right;   RIGHT CARPAL TUNNEL RELEASE  2011   RIGHT KNEE ARTHROSCOPY   AUG OR SEPT 2015   AT [place] Right 04/28/2020   XI ROBOTIC ASSISTED THORASCOPY-LOBECTOMY (Right)   TONSILLECTOMY     as child   TOTAL KNEE ARTHROPLASTY Left 07/14/2013   Procedure: LEFT TOTAL KNEE ARTHROPLASTY;  Surgeon: Mauri Pole, MD;  Location: [facility];  Service: Orthopedics;  Laterality: Left;   TOTAL KNEE ARTHROPLASTY Right 07/21/2014   Procedure: RIGHT TOTAL KNEE ARTHROPLASTY;  Surgeon: Mauri Pole, MD;  Location: [facility];   Service: Orthopedics;  Laterality: Right;   TOTAL SHOULDER ARTHROPLASTY Right 10/17/2019   Procedure: RIGHT REVERSE TOTAL SHOULDER REPLACEMENT;  Surgeon: Netta Cedars, MD;  Location: [facility];  Service: Orthopedics;  Laterality: Right;  interscalene block   TRIGGER FINGER RELEASE Left    VIDEO BRONCHOSCOPY WITH ENDOBRONCHIAL NAVIGATION Right 02/24/2020   Procedure: VIDEO BRONCHOSCOPY WITH ENDOBRONCHIAL NAVIGATION;  Surgeon: Collene Gobble, MD;  Location:  [place] [facility];  Service: Pulmonary;  Laterality: Right;   WEDGE RESECTION Right 04/28/2020   XI ROBOTIC ASSISTED THORASCOPY-RIGHT LOWER LOBE WEDGE RESECTION (Right Chest)     REVIEW OF SYSTEMS:  A comprehensive review of systems was negative.   PHYSICAL EXAMINATION: General appearance: alert, cooperative, and no distress Head: Normocephalic, without obvious abnormality, atraumatic Neck: no adenopathy, no JVD, supple, symmetrical, trachea midline, and thyroid not enlarged, symmetric, no tenderness/mass/nodules Lymph nodes: Cervical, supraclavicular, and axillary nodes normal. Resp: clear to auscultation bilaterally Back: symmetric, no curvature. ROM normal. No CVA tenderness. Cardio: regular rate and rhythm, S1, S2 normal, no murmur, click, rub or gallop GI: soft, non-tender; bowel sounds normal; no masses,  no organomegaly Extremities: extremities normal, atraumatic, no cyanosis or edema  ECOG PERFORMANCE STATUS: 1 - Symptomatic but completely ambulatory   Blood pressure 127/86, pulse 93, temperature 97.7 F (36.5 C), temperature source Oral, resp. rate 15, weight 207 lb 12.8 oz (94.3 kg), SpO2 97 %.  LABORATORY DATA: Lab Results  Component Value Date   WBC 8.6 04/17/2022   HGB 14.3 04/17/2022   HCT 42.1 04/17/2022   MCV 88.8 04/17/2022   PLT 194 04/17/2022      Chemistry      Component Value Date/Time   NA 136 04/17/2022 1034   NA 140 06/24/2021 1608   K 4.4 04/17/2022 1034   CL 104 04/17/2022 1034   CO2 27 04/17/2022 1034    BUN 19 04/17/2022 1034   BUN 15 06/24/2021 1608   CREATININE 0.91 04/17/2022 1034      Component Value Date/Time   CALCIUM 9.2 04/17/2022 1034   ALKPHOS 87 04/17/2022 1034   AST 18 04/17/2022 1034   ALT 18 04/17/2022 1034   BILITOT 0.6 04/17/2022 1034       RADIOGRAPHIC STUDIES: CT Chest W Contrast  Result Date: 04/18/2022 CLINICAL DATA:  Non-small cell lung cancer staging, status post right lower lobectomy, chemotherapy complete * Tracking Code: BO * EXAM: CT CHEST WITH CONTRAST TECHNIQUE: Multidetector CT imaging of the chest was performed during intravenous contrast administration. RADIATION DOSE REDUCTION: This exam was performed according to the departmental dose-optimization program which includes automated exposure control, adjustment of the mA and/or kV according to patient size and/or use of iterative reconstruction technique. CONTRAST:  32mL OMNIPAQUE IOHEXOL 300 MG/ML  SOLN COMPARISON:  10/24/2021 FINDINGS: Cardiovascular: Aortic atherosclerosis. Normal heart size. Three-vessel coronary artery calcifications. No pericardial effusion. Mediastinum/Nodes: No enlarged mediastinal, hilar, or axillary lymph nodes. Thyroid gland, trachea, and esophagus demonstrate no significant findings. Lungs/Pleura: Mild centrilobular emphysema. Unchanged postoperative findings status post right lower lobectomy. No pleural effusion or pneumothorax. Upper Abdomen: No acute abnormality. Somewhat coarse contour of the liver in the included upper abdomen. Tiny gallstones and or sludge in the dependent gallbladder (series 2, image 165). Incompletely imaged splenomegaly, maximum span 15.4 cm Musculoskeletal: No chest wall abnormality. No acute osseous findings. Status post right shoulder reverse arthroplasty. IMPRESSION: 1. Unchanged postoperative findings status post right lower lobectomy. No evidence of recurrent or metastatic disease in the chest. 2. Mild emphysema. 3. Coronary artery disease. 4. Somewhat coarse  contour of the liver in the included upper abdomen, suggestive of cirrhosis. Correlate with biochemical findings. 5. Splenomegaly. 6. Cholelithiasis. Aortic Atherosclerosis (ICD10-I70.0) and Emphysema (ICD10-J43.9). Electronically Signed   By: Delanna Ahmadi M.D.   On: 04/18/2022 06:29     ASSESSMENT AND PLAN: This is a very pleasant 71 years old white male with a stage IIIa non-small cell lung cancer, mucinous adenocarcinoma  status post right lower lobectomy with lymph node dissection in August 2021. The patient underwent adjuvant systemic chemotherapy with cisplatin and Alimta status post 4 cycles. The patient tolerated the previous 4 cycles of his treatment well with no concerning adverse effect except for fatigue. The patient has been on observation now for around 18 months and he is feeling fine. He had repeat CT scan of the chest performed recently.  I personally and independently reviewed the scan and discussed the results with the patient today. His scan showed no concerning findings for disease progression. I recommended for him to continue on observation with repeat CT scan of the chest in 6 months. Regarding the liver cirrhosis, I strongly advised the patient to cut back on the alcohol drinking.  He will also discuss with his primary care physician if there is any additional studies needed. He was advised to call immediately if he has any concerning symptoms in the interval. The patient voices understanding of current disease status and treatment options and is in agreement with the current care plan.  All questions were answered. The patient knows to call the clinic with any problems, questions or concerns. We can certainly see the patient much sooner if necessary.  Disclaimer: This note was dictated with voice recognition software. Similar sounding words can inadvertently be transcribed and may not be corrected upon review.

## 2022-04-24 ENCOUNTER — Other Ambulatory Visit: Payer: Medicare Other

## 2022-04-26 ENCOUNTER — Ambulatory Visit: Payer: Medicare Other | Admitting: Internal Medicine

## 2022-07-17 ENCOUNTER — Encounter (HOSPITAL_BASED_OUTPATIENT_CLINIC_OR_DEPARTMENT_OTHER): Payer: Self-pay | Admitting: Internal Medicine

## 2022-07-28 ENCOUNTER — Encounter (HOSPITAL_BASED_OUTPATIENT_CLINIC_OR_DEPARTMENT_OTHER): Payer: Self-pay | Admitting: Internal Medicine

## 2022-07-28 ENCOUNTER — Other Ambulatory Visit: Payer: Self-pay | Admitting: Internal Medicine

## 2022-07-28 ENCOUNTER — Ambulatory Visit (INDEPENDENT_AMBULATORY_CARE_PROVIDER_SITE_OTHER): Payer: Medicare Other | Admitting: Internal Medicine

## 2022-07-28 VITALS — BP 128/78 | HR 67 | Ht 73.0 in | Wt 204.3 lb

## 2022-07-28 DIAGNOSIS — R5383 Other fatigue: Secondary | ICD-10-CM

## 2022-07-28 DIAGNOSIS — E785 Hyperlipidemia, unspecified: Secondary | ICD-10-CM | POA: Diagnosis not present

## 2022-07-28 DIAGNOSIS — I2510 Atherosclerotic heart disease of native coronary artery without angina pectoris: Secondary | ICD-10-CM

## 2022-07-28 DIAGNOSIS — I200 Unstable angina: Secondary | ICD-10-CM

## 2022-07-28 NOTE — H&P (View-Only) (Signed)
LIPID CLINIC CONSULT NOTE  Chief Complaint:  Fatigue, chest ache  Primary Care Physician: Gaynelle Arabian, MD  Primary Cardiologist:  Pixie Casino, MD  HPI:  [name] [name] is a 71 y.o. male who is being seen today for the evaluation of dyslipidemia, statin intolerance at the request of Gaynelle Arabian, MD.  This is a pleasant 71 year old male kindly referred for management of dyslipidemia with a history of statin intolerance (this included atorvastatin, ezetimibe, pravastatin, fenofibrate and pitavastatin).  He reports longstanding dyslipidemia and has been tried on both multiple statins, ezetimibe and fenofibrate in the past causing myalgias which were intolerable.  More recently had lipids off of medication and total cholesterol was 267, triglycerides 166, HDL 53 and LDL 183.  Non-HDL cholesterol 214.  There is a strong family history of heart disease. He related that all 5 siblings (3 girls, 2 boys) take cholesterol medicine and both brothers had heart attacks. Father's cholesterol was very high, difficult to keep below 300 (per patient report).   06/11/2020  [name] [name] returns today for follow-up. Unfortunately he is CT calcium score was abnormal showing an elevated score of 1222, 93rd percentile for age and sex matched control. He denies any cardiac symptoms however was found on extracardiac imaging to have a macrolobulated mass in the right lower lobe concerning for primary bronchogenic neoplasm. Subsequently he underwent work-up and this was found to be hypermetabolic. 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 Fortunately no evidence of metastases and he is planning to undergo neoadjuvant therapy. He was able to get through the procedure well without any issues therefore I do not think that he has any significant obstructive coronary disease. He has responded well to Praluent with a marked reduction in cholesterol. Total is now  143, triglycerides 103, HDL 37 and LDL of 87. For the time being we will continue this. He did get a grant from [employer] and I informed him that it may be that that is no longer active as of next summer. That foundation is not currently providing grants. We will have to seek out other all options possibly next year.  06/13/2021  [name] [name] returns today for follow-up.  Overall he says he is doing well.  He seems to now be in remission from the lung cancer which was picked up incidentally with calcium scoring.  Although he seems to be doing well from that and is well removed from chemotherapy, he reports he has had progressive fatigue and shortness of breath as well as some chest discomfort.  As noted he had a very high calcium score which was 1222.  He did not seem necessarily symptomatic at this time therefore we did not pursue additional testing however now may be having more symptoms that could be coronary.  06/24/2021  [name] [name] is seen today in follow-up.  He recently underwent CT coronary angiography for progressive fatigue and chest pressure.  He told me the other day he played pool for about an hour and a half and was completely exhausted and had chest pressure after it.  He said he took 3 full-strength aspirin with some relief in his symptoms.  Coronary CT showed a high calcium score of 1300, 91st percentile.  He was noted to have subtotal occlusion of the RCA (CAD RADS 5) and disease in the LAD and circumflex.  A small PFO was noted.  He was referred for FFR.  FFR showed positive for reduced  flow in the proximal RCA however the LAD and circumflex were not noted to be hemodynamically significant.  Cardiac catheterization has been recommended.  I shared the results with him today and he is in agreement.  Would like him to start aspirin 81 mg daily and will provide sublingual nitro as needed.  01/10/2022  [name] [name] returns today for follow-up.  He seems to be doing well.  He denies any anginal symptoms or  worsening shortness of breath and fatigue.  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  He has been on aggressive medical therapy for this.  I had started him on Praluent.  His cholesterol is lower with total now 153 down from 186 and LDL of 78 (down from 116).  He does not take a daily aspirin.  He said been having issues with his left shoulder which apparently has multiple rotator cuff muscle tears and he says he may need eventually to have shoulder replacement.  He is currently on a narcotic (Vicodin), 4 times daily.  07/28/2022  [name] [name] returns today for follow-up.  He requested a visit today because of progressive fatigue, left chest ache and shortness of breath.  He was seen back in August by Ambrose Pancoast, NP for preoperative risk assessment.  He is planning reverse left shoulder arthroplasty.  This procedure however was not performed and has been delayed until January due to some personal issues.  However, over the past several months, he has had worsening symptoms.  He is significantly more fatigued and short of breath and experiencing left-sided chest discomfort.  Of note, his last heart catheterization was in October 2022 which showed mild OM and LAD disease as well as high-grade disease of the distal left circumflex however this was considered a small vessel.  The right coronary was occluded with right to right and septal collaterals.  He is continue to follow with oncology regarding his lung cancer which is in remission without evidence of recurrent metastatic disease by CT in August.  Hemoglobin was 14.3 in August as well.  PMHx:  Past Medical History:  Diagnosis Date   Arthritis    Carpal tunnel syndrome    Right   Cataracts, bilateral    very mild   Early cataracts, bilateral    Fatty liver    Gallstones    GERD (gastroesophageal reflux disease)    Headache 2011    HX OF VISUAL MIGRAINES - NO PAIN --MIGRAINES ATTRIBUTED TO PT CUTTING NICOTINE PATCH IN HALF BEFORE APPLYING TO HIS SKIN.     Hepatitis 2001   C -treated and no problems since  2001 'cured'   History of hiatal hernia    History of kidney stones    found on CT Scan   HOH (hard of hearing)    lung ca dx'd 12/06/19   RIGHT LUNG   Muscle cramps    hands and legs   Osteopenia    Pain    KNEE OA   Right lower lobe lung mass    Shortness of breath dyspnea    "SOMETIMES FEELS LIKE I CAN'T CATCH A DEEP BREATH "- ON GOING FOR YEARS - STATES MEDICAL DOCTOR SENT HIM FOR CARDIAC WORK UP 2011 - [place]   Stroke ([place])    HX POSSIBLE TIA - RT LEG NUMBNESS AND TINGLING FINGERS AND NUMBNESS RT FACE - ALL RESOLVED ; STATES NEUROLOGIST COULD NOT FIND  ANY EVIDENCE OF STROKE - THOUGHT PT'S EPISODE MIGHT BE FROM CUTTING NICOTINE PATCH IN HALF AND APPLYING TO SKIN   Trigger finger    LEFT MIDDLE FINGER   Wears glasses     Past Surgical History:  Procedure Laterality Date   BRONCHIAL BIOPSY  02/24/2020   Procedure: BRONCHIAL BIOPSIES;  Surgeon: Collene Gobble, MD;  Location: [facility] ENDOSCOPY;  Service: Pulmonary;;   BRONCHIAL BRUSHINGS  02/24/2020   Procedure: BRONCHIAL BRUSHINGS;  Surgeon: Collene Gobble, MD;  Location: [facility] ENDOSCOPY;  Service: Pulmonary;;   BRONCHIAL NEEDLE ASPIRATION BIOPSY  02/24/2020   Procedure: BRONCHIAL NEEDLE ASPIRATION BIOPSIES;  Surgeon: Collene Gobble, MD;  Location: [place];  Service: Pulmonary;;   BRONCHIAL WASHINGS  02/24/2020   Procedure: BRONCHIAL WASHINGS;  Surgeon: Collene Gobble, MD;  Location: [place] ENDOSCOPY;  Service: Pulmonary;;   COLONOSCOPY     INTERCOSTAL NERVE BLOCK Right 04/28/2020   Procedure: INTERCOSTAL NERVE BLOCK;  Surgeon: Lajuana Matte, MD;  Location: [place];  Service: Thoracic;  Laterality: Right;   left ankle surgery     fractured   LEFT HEART CATH AND CORONARY ANGIOGRAPHY N/A 06/29/2021   Procedure: LEFT HEART CATH AND CORONARY  ANGIOGRAPHY;  Surgeon: Early Osmond, MD;  Location: [place] CV LAB;  Service: Cardiovascular;  Laterality: N/A;   left knee surgery     MENISCAL REMOVED   left shoulder     torn ligaments left shoulder   LUMBAR FUSION     L3 L4 L5   NODE DISSECTION Right 04/28/2020   Procedure: NODE DISSECTION;  Surgeon: Lajuana Matte, MD;  Location: [place];  Service: Thoracic;  Laterality: Right;   RIGHT CARPAL TUNNEL RELEASE  2011   RIGHT KNEE ARTHROSCOPY   AUG OR SEPT 2015   AT [place] Right 04/28/2020   XI ROBOTIC ASSISTED THORASCOPY-LOBECTOMY (Right)   TONSILLECTOMY     as child   TOTAL KNEE ARTHROPLASTY Left 07/14/2013   Procedure: LEFT TOTAL KNEE ARTHROPLASTY;  Surgeon: Mauri Pole, MD;  Location: [facility];  Service: Orthopedics;  Laterality: Left;   TOTAL KNEE ARTHROPLASTY Right 07/21/2014   Procedure: RIGHT TOTAL KNEE ARTHROPLASTY;  Surgeon: Mauri Pole, MD;  Location: [facility];  Service: Orthopedics;  Laterality: Right;   TOTAL SHOULDER ARTHROPLASTY Right 10/17/2019   Procedure: RIGHT REVERSE TOTAL SHOULDER REPLACEMENT;  Surgeon: Netta Cedars, MD;  Location: [facility];  Service: Orthopedics;  Laterality: Right;  interscalene block   TRIGGER FINGER RELEASE Left    VIDEO BRONCHOSCOPY WITH ENDOBRONCHIAL NAVIGATION Right 02/24/2020   Procedure: VIDEO BRONCHOSCOPY WITH ENDOBRONCHIAL NAVIGATION;  Surgeon: Collene Gobble, MD;  Location: [facility] ENDOSCOPY;  Service: Pulmonary;  Laterality: Right;   WEDGE RESECTION Right 04/28/2020   XI ROBOTIC ASSISTED THORASCOPY-RIGHT LOWER LOBE WEDGE RESECTION (Right Chest)     FAMHx:  Family History  Problem Relation Age of Onset   Alcohol abuse Father    Hyperlipidemia Father    Heart attack Brother 60   Hyperlipidemia Brother    Hyperlipidemia Sister        3 sisters   Heart attack Brother    Hyperlipidemia Brother     SOCHx:   reports that he quit smoking about 10 years ago. His smoking use  included cigarettes. He has never used smokeless tobacco. He reports that he does not currently use alcohol after a past usage of about 7.0 standard drinks of alcohol per week.  He reports that he does not currently use drugs after having used the following drugs: Marijuana.  ALLERGIES:  Allergies  Allergen Reactions   Statins Other (See Comments)    MYALGIAS   Ezetimibe     Other reaction(s): ineffective for cholesterol   Fenofibrate     Other reaction(s): ineffective for cholesterol (2014)   Varenicline Other (See Comments)    Unknown reaction     ROS: Pertinent items noted in HPI and remainder of comprehensive ROS otherwise negative.  HOME MEDS: Current Outpatient Medications on File Prior to Visit  Medication Sig Dispense Refill   Alirocumab (PRALUENT) 150 MG/ML SOAJ INJECT 1 DOSE INTO THE SKIN EVERY 14 (FOURTEEN) DAYS. 6 mL 3   clindamycin (CLEOCIN) 150 MG capsule Take 600 mg by mouth See admin instructions. Take 600 mg 1 hour prior to dental work     cyclobenzaprine (FLEXERIL) 10 MG tablet Take 10 mg by mouth 3 (three) times daily.     donepezil (ARICEPT) 10 MG tablet Take 1 tablet by mouth at bedtime.     HYDROcodone-acetaminophen (NORCO/VICODIN) 5-325 MG tablet Take 1-2 tablets by mouth every 6 (six) hours as needed for moderate pain.     ibuprofen (ADVIL) 200 MG tablet Take 200 mg by mouth every 6 (six) hours as needed. 3 tabs prn     Loratadine 10 MG CAPS Take 1 capsule by mouth.     Magnesium 500 MG TABS Take 500 mg by mouth in the morning, at noon, and at bedtime.     sertraline (ZOLOFT) 100 MG tablet Take 100 mg by mouth daily.     STUDY - ASPIRE - aspirin 81 mg or placebo tablet (PI-Sethi)      tamsulosin (FLOMAX) 0.4 MG CAPS capsule Take 0.4 mg by mouth daily.     No current facility-administered medications on file prior to visit.    LABS/IMAGING: No results found for this or any previous visit (from the past 48 hour(s)). No results found.  LIPID PANEL:     Component Value Date/Time   CHOL 153 12/12/2021 1534   TRIG 126 12/12/2021 1534   HDL 53 12/12/2021 1534   CHOLHDL 2.9 12/12/2021 1534   CHOLHDL 5.6 10/31/2007 0830   VLDL 26 10/31/2007 0830   LDLCALC 78 12/12/2021 1534    WEIGHTS: Wt Readings from Last 3 Encounters:  07/28/22 204 lb 4.8 oz (92.7 kg)  04/19/22 207 lb 12.8 oz (94.3 kg)  01/10/22 204 lb (92.5 kg)    VITALS: BP 128/78 (BP Location: Right Arm, Patient Position: Sitting, Cuff Size: Normal)   Pulse 67   Ht 6\' 1"  (1.854 m)   Wt 204 lb 4.8 oz (92.7 kg)   BMI 26.95 kg/m   EXAM: General appearance: alert and no distress Neck: no carotid bruit, no JVD, and thyroid not enlarged, symmetric, no tenderness/mass/nodules Lungs: clear to auscultation bilaterally Heart: regular rate and rhythm, S1, S2 normal, no murmur, click, rub or gallop Abdomen: soft, non-tender; bowel sounds normal; no masses,  no organomegaly Extremities: extremities normal, atraumatic, no cyanosis or edema Pulses: 2+ and symmetric Skin: Skin color, texture, turgor normal. No rashes or lesions Neurologic: Grossly normal Psych: Pleasant  EKG: Normal sinus rhythm at 67, LAFB-personally reviewed  ASSESSMENT: New left chest ache, fatigue and dyspnea Abnormal coronary CT angiogram showing calcium score of 1300, 91st percentile, subtotal occlusion of the RCA with significant LAD and circumflex disease, positive FFR in the proximal RCA (06/2021) -confirmed by left heart catheterization in November 2022,  medical management recommended Mixed dyslipidemia with high LDL cholesterol Possible familial hyperlipidemia-Dutch score 4 Strong family history of high cholesterol Statin intolerance, Zetia and fenofibrate intolerance-myalgias Abnormal CAC score of 1222, 93rd percentile for age and sex matched control (01/2020) Lung cancer status post wedge resection Small PFO-unknown significance  PLAN: 1.   Mr. Lopata says that he has had progressive fatigue,  chest ache and dyspnea with exertion over the past couple of months which are significant.  He was noted to have significant coronary calcification and disease as outlined above on his heart catheterization.  No intervention was performed in October 2022.  I think noninvasive testing is not likely to be conclusive and likely he will have defects as he had high-grade distal circumflex and homo collateralized occluded RCA findings which would be abnormal on a Myoview.  He really needs a diagnostic catheterization again to see if there is anything that could potentially be intervened upon.  We discussed a repeat heart catheterization and he is agreeable to proceed. Will try to arrange for the week after Thanksgiving. If his chest pain worsens, he is advised to go to the emergency department.  Shared Decision Making/Informed Consent The risks [stroke (1 in 1000), death (1 in 1000), kidney failure [usually temporary] (1 in 500), bleeding (1 in 200), allergic reaction [possibly serious] (1 in 200)], benefits (diagnostic support and management of coronary artery disease) and alternatives of a cardiac catheterization were discussed in detail with Mr. Franks and he is willing to proceed.  Follow-up with me afterwards.  Pixie Casino, MD, [facility], [place] Director of the Advanced Lipid Disorders &  Cardiovascular Risk Reduction Clinic Diplomate of the American Board of Clinical Lipidology Attending Cardiologist  Direct Dial: [phone]  Fax: [phone]  Website:  [email or web address] [email or web address] Kines 07/28/2022, 2:36 PM

## 2022-07-28 NOTE — Patient Instructions (Signed)
Medication Instructions:  NO CHANGES  *If you need a refill on your cardiac medications before your next appointment, please call your pharmacy*   Lab Work: FASTING lab work 1120-11/22  If you have labs (blood work) drawn today and your tests are completely normal, you will receive your results only by: [place] (if you have MyChart) OR A paper copy in the mail If you have any lab test that is abnormal or we need to change your treatment, we will call you to review the results.   Testing/Procedures: Left Heart Cath at [facility] on 11/29 with Dr. Ali Lowe   Follow-Up: At [facility], you and your health needs are our priority.  As part of our continuing mission to provide you with exceptional heart care, we have created designated Provider Care Teams.  These Care Teams include your primary Cardiologist (physician) and Advanced Practice Providers (APPs -  Physician Assistants and Nurse Practitioners) who all work together to provide you with the care you need, when you need it.  We recommend signing up for the patient portal called "MyChart".  Sign up information is provided on this After Visit Summary.  MyChart is used to connect with patients for Virtual Visits (Telemedicine).  Patients are able to view lab/test results, encounter notes, upcoming appointments, etc.  Non-urgent messages can be sent to your provider as well.   To learn more about what you can do with MyChart, go to [email or web address].    Your next appointment:    Mid-Late December with Dr. Debara Pickett or NP/PA Other Instructions  [facility] [place] [address] Dept: [phone]  [place] Nygaard  44/31/5400  You are scheduled for a Cardiac Catheterization on Wednesday, November 29 with Dr. Lenna Sciara.  1. Please arrive at the [facility] (Main Entrance A) at [facility]: [address] [place], [place] [address] at 6:30 AM (This time is two hours before your procedure to ensure your preparation). Free valet parking service is available.   Special note: Every effort is made to have your procedure done on time. Please understand that emergencies sometimes delay scheduled procedures.  2. Diet: Do not eat solid foods after midnight. The patient may have clear liquids until 5am upon the day of the procedure.  3. Labs: You will need to have blood drawn on 11/20 - 22, 2023. You do not need to be fasting.  4. Medication instructions in preparation for your procedure:   On the morning of your procedure, take Aspirin 81 mg and any morning medicines NOT listed above.  You may use sips of water.  5. Plan for one night stay--bring personal belongings. 6. Bring a current list of your medications and current insurance cards. 7. You MUST have a responsible person to drive you home. 8. Someone MUST be with you the first 24 hours after you arrive home or your discharge will be delayed. 9. Please wear clothes that are easy to get on and off and wear slip-on shoes.  Thank you for allowing [place] to care for you!   -- [place] [facility]

## 2022-07-28 NOTE — Progress Notes (Signed)
LIPID CLINIC CONSULT NOTE  Chief Complaint:  Fatigue, chest ache  Primary Care Physician: Gaynelle Arabian, MD  Primary Cardiologist:  Pixie Casino, MD  HPI:  [name] [name] is a 71 y.o. male who is being seen today for the evaluation of dyslipidemia, statin intolerance at the request of Gaynelle Arabian, MD.  This is a pleasant 71 year old male kindly referred for management of dyslipidemia with a history of statin intolerance (this included atorvastatin, ezetimibe, pravastatin, fenofibrate and pitavastatin).  He reports longstanding dyslipidemia and has been tried on both multiple statins, ezetimibe and fenofibrate in the past causing myalgias which were intolerable.  More recently had lipids off of medication and total cholesterol was 267, triglycerides 166, HDL 53 and LDL 183.  Non-HDL cholesterol 214.  There is a strong family history of heart disease. He related that all 5 siblings (3 girls, 2 boys) take cholesterol medicine and both brothers had heart attacks. Father's cholesterol was very high, difficult to keep below 300 (per patient report).   06/11/2020  [name] [name] returns today for follow-up. Unfortunately he is CT calcium score was abnormal showing an elevated score of 1222, 93rd percentile for age and sex matched control. He denies any cardiac symptoms however was found on extracardiac imaging to have a macrolobulated mass in the right lower lobe concerning for primary bronchogenic neoplasm. Subsequently he underwent work-up and this was found to be hypermetabolic. 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 Fortunately no evidence of metastases and he is planning to undergo neoadjuvant therapy. He was able to get through the procedure well without any issues therefore I do not think that he has any significant obstructive coronary disease. He has responded well to Praluent with a marked reduction in cholesterol. Total is now  143, triglycerides 103, HDL 37 and LDL of 87. For the time being we will continue this. He did get a grant from [employer] and I informed him that it may be that that is no longer active as of next summer. That foundation is not currently providing grants. We will have to seek out other all options possibly next year.  06/13/2021  [name] [name] returns today for follow-up.  Overall he says he is doing well.  He seems to now be in remission from the lung cancer which was picked up incidentally with calcium scoring.  Although he seems to be doing well from that and is well removed from chemotherapy, he reports he has had progressive fatigue and shortness of breath as well as some chest discomfort.  As noted he had a very high calcium score which was 1222.  He did not seem necessarily symptomatic at this time therefore we did not pursue additional testing however now may be having more symptoms that could be coronary.  06/24/2021  [name] [name] is seen today in follow-up.  He recently underwent CT coronary angiography for progressive fatigue and chest pressure.  He told me the other day he played pool for about an hour and a half and was completely exhausted and had chest pressure after it.  He said he took 3 full-strength aspirin with some relief in his symptoms.  Coronary CT showed a high calcium score of 1300, 91st percentile.  He was noted to have subtotal occlusion of the RCA (CAD RADS 5) and disease in the LAD and circumflex.  A small PFO was noted.  He was referred for FFR.  FFR showed positive for reduced  flow in the proximal RCA however the LAD and circumflex were not noted to be hemodynamically significant.  Cardiac catheterization has been recommended.  I shared the results with him today and he is in agreement.  Would like him to start aspirin 81 mg daily and will provide sublingual nitro as needed.  01/10/2022  [name] [name] returns today for follow-up.  He seems to be doing well.  He denies any anginal symptoms or  worsening shortness of breath and fatigue.  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  He has been on aggressive medical therapy for this.  I had started him on Praluent.  His cholesterol is lower with total now 153 down from 186 and LDL of 78 (down from 116).  He does not take a daily aspirin.  He said been having issues with his left shoulder which apparently has multiple rotator cuff muscle tears and he says he may need eventually to have shoulder replacement.  He is currently on a narcotic (Vicodin), 4 times daily.  07/28/2022  [name] [name] returns today for follow-up.  He requested a visit today because of progressive fatigue, left chest ache and shortness of breath.  He was seen back in August by Ambrose Pancoast, NP for preoperative risk assessment.  He is planning reverse left shoulder arthroplasty.  This procedure however was not performed and has been delayed until January due to some personal issues.  However, over the past several months, he has had worsening symptoms.  He is significantly more fatigued and short of breath and experiencing left-sided chest discomfort.  Of note, his last heart catheterization was in October 2022 which showed mild OM and LAD disease as well as high-grade disease of the distal left circumflex however this was considered a small vessel.  The right coronary was occluded with right to right and septal collaterals.  He is continue to follow with oncology regarding his lung cancer which is in remission without evidence of recurrent metastatic disease by CT in August.  Hemoglobin was 14.3 in August as well.  PMHx:  Past Medical History:  Diagnosis Date   Arthritis    Carpal tunnel syndrome    Right   Cataracts, bilateral    very mild   Early cataracts, bilateral    Fatty liver    Gallstones    GERD (gastroesophageal reflux disease)    Headache 2011    HX OF VISUAL MIGRAINES - NO PAIN --MIGRAINES ATTRIBUTED TO PT CUTTING NICOTINE PATCH IN HALF BEFORE APPLYING TO HIS SKIN.     Hepatitis 2001   C -treated and no problems since  2001 'cured'   History of hiatal hernia    History of kidney stones    found on CT Scan   HOH (hard of hearing)    lung ca dx'd 12/06/19   RIGHT LUNG   Muscle cramps    hands and legs   Osteopenia    Pain    KNEE OA   Right lower lobe lung mass    Shortness of breath dyspnea    "SOMETIMES FEELS LIKE I CAN'T CATCH A DEEP BREATH "- ON GOING FOR YEARS - STATES MEDICAL DOCTOR SENT HIM FOR CARDIAC WORK UP 2011 - [place]   Stroke ([place])    HX POSSIBLE TIA - RT LEG NUMBNESS AND TINGLING FINGERS AND NUMBNESS RT FACE - ALL RESOLVED ; STATES NEUROLOGIST COULD NOT FIND  ANY EVIDENCE OF STROKE - THOUGHT PT'S EPISODE MIGHT BE FROM CUTTING NICOTINE PATCH IN HALF AND APPLYING TO SKIN   Trigger finger    LEFT MIDDLE FINGER   Wears glasses     Past Surgical History:  Procedure Laterality Date   BRONCHIAL BIOPSY  02/24/2020   Procedure: BRONCHIAL BIOPSIES;  Surgeon: Collene Gobble, MD;  Location: [facility] ENDOSCOPY;  Service: Pulmonary;;   BRONCHIAL BRUSHINGS  02/24/2020   Procedure: BRONCHIAL BRUSHINGS;  Surgeon: Collene Gobble, MD;  Location: [facility] ENDOSCOPY;  Service: Pulmonary;;   BRONCHIAL NEEDLE ASPIRATION BIOPSY  02/24/2020   Procedure: BRONCHIAL NEEDLE ASPIRATION BIOPSIES;  Surgeon: Collene Gobble, MD;  Location: [place];  Service: Pulmonary;;   BRONCHIAL WASHINGS  02/24/2020   Procedure: BRONCHIAL WASHINGS;  Surgeon: Collene Gobble, MD;  Location: [place] ENDOSCOPY;  Service: Pulmonary;;   COLONOSCOPY     INTERCOSTAL NERVE BLOCK Right 04/28/2020   Procedure: INTERCOSTAL NERVE BLOCK;  Surgeon: Lajuana Matte, MD;  Location: [place];  Service: Thoracic;  Laterality: Right;   left ankle surgery     fractured   LEFT HEART CATH AND CORONARY ANGIOGRAPHY N/A 06/29/2021   Procedure: LEFT HEART CATH AND CORONARY  ANGIOGRAPHY;  Surgeon: Early Osmond, MD;  Location: [facility] CV LAB;  Service: Cardiovascular;  Laterality: N/A;   left knee surgery     MENISCAL REMOVED   left shoulder     torn ligaments left shoulder   LUMBAR FUSION     L3 L4 L5   NODE DISSECTION Right 04/28/2020   Procedure: NODE DISSECTION;  Surgeon: Lajuana Matte, MD;  Location: [place];  Service: Thoracic;  Laterality: Right;   RIGHT CARPAL TUNNEL RELEASE  2011   RIGHT KNEE ARTHROSCOPY   AUG OR SEPT 2015   AT [place] Right 04/28/2020   XI ROBOTIC ASSISTED THORASCOPY-LOBECTOMY (Right)   TONSILLECTOMY     as child   TOTAL KNEE ARTHROPLASTY Left 07/14/2013   Procedure: LEFT TOTAL KNEE ARTHROPLASTY;  Surgeon: Mauri Pole, MD;  Location: [facility];  Service: Orthopedics;  Laterality: Left;   TOTAL KNEE ARTHROPLASTY Right 07/21/2014   Procedure: RIGHT TOTAL KNEE ARTHROPLASTY;  Surgeon: Mauri Pole, MD;  Location: [facility];  Service: Orthopedics;  Laterality: Right;   TOTAL SHOULDER ARTHROPLASTY Right 10/17/2019   Procedure: RIGHT REVERSE TOTAL SHOULDER REPLACEMENT;  Surgeon: Netta Cedars, MD;  Location: [facility];  Service: Orthopedics;  Laterality: Right;  interscalene block   TRIGGER FINGER RELEASE Left    VIDEO BRONCHOSCOPY WITH ENDOBRONCHIAL NAVIGATION Right 02/24/2020   Procedure: VIDEO BRONCHOSCOPY WITH ENDOBRONCHIAL NAVIGATION;  Surgeon: Collene Gobble, MD;  Location: [facility] ENDOSCOPY;  Service: Pulmonary;  Laterality: Right;   WEDGE RESECTION Right 04/28/2020   XI ROBOTIC ASSISTED THORASCOPY-RIGHT LOWER LOBE WEDGE RESECTION (Right Chest)     FAMHx:  Family History  Problem Relation Age of Onset   Alcohol abuse Father    Hyperlipidemia Father    Heart attack Brother 52   Hyperlipidemia Brother    Hyperlipidemia Sister        3 sisters   Heart attack Brother    Hyperlipidemia Brother     SOCHx:   reports that he quit smoking about 10 years ago. His smoking use  included cigarettes. He has never used smokeless tobacco. He reports that he does not currently use alcohol after a past usage of about 7.0 standard drinks of alcohol per week.  He reports that he does not currently use drugs after having used the following drugs: Marijuana.  ALLERGIES:  Allergies  Allergen Reactions   Statins Other (See Comments)    MYALGIAS   Ezetimibe     Other reaction(s): ineffective for cholesterol   Fenofibrate     Other reaction(s): ineffective for cholesterol (2014)   Varenicline Other (See Comments)    Unknown reaction     ROS: Pertinent items noted in HPI and remainder of comprehensive ROS otherwise negative.  HOME MEDS: Current Outpatient Medications on File Prior to Visit  Medication Sig Dispense Refill   Alirocumab (PRALUENT) 150 MG/ML SOAJ INJECT 1 DOSE INTO THE SKIN EVERY 14 (FOURTEEN) DAYS. 6 mL 3   clindamycin (CLEOCIN) 150 MG capsule Take 600 mg by mouth See admin instructions. Take 600 mg 1 hour prior to dental work     cyclobenzaprine (FLEXERIL) 10 MG tablet Take 10 mg by mouth 3 (three) times daily.     donepezil (ARICEPT) 10 MG tablet Take 1 tablet by mouth at bedtime.     HYDROcodone-acetaminophen (NORCO/VICODIN) 5-325 MG tablet Take 1-2 tablets by mouth every 6 (six) hours as needed for moderate pain.     ibuprofen (ADVIL) 200 MG tablet Take 200 mg by mouth every 6 (six) hours as needed. 3 tabs prn     Loratadine 10 MG CAPS Take 1 capsule by mouth.     Magnesium 500 MG TABS Take 500 mg by mouth in the morning, at noon, and at bedtime.     sertraline (ZOLOFT) 100 MG tablet Take 100 mg by mouth daily.     STUDY - ASPIRE - aspirin 81 mg or placebo tablet (PI-Sethi)      tamsulosin (FLOMAX) 0.4 MG CAPS capsule Take 0.4 mg by mouth daily.     No current facility-administered medications on file prior to visit.    LABS/IMAGING: No results found for this or any previous visit (from the past 48 hour(s)). No results found.  LIPID PANEL:     Component Value Date/Time   CHOL 153 12/12/2021 1534   TRIG 126 12/12/2021 1534   HDL 53 12/12/2021 1534   CHOLHDL 2.9 12/12/2021 1534   CHOLHDL 5.6 10/31/2007 0830   VLDL 26 10/31/2007 0830   LDLCALC 78 12/12/2021 1534    WEIGHTS: Wt Readings from Last 3 Encounters:  07/28/22 204 lb 4.8 oz (92.7 kg)  04/19/22 207 lb 12.8 oz (94.3 kg)  01/10/22 204 lb (92.5 kg)    VITALS: BP 128/78 (BP Location: Right Arm, Patient Position: Sitting, Cuff Size: Normal)   Pulse 67   Ht 6\' 1"  (1.854 m)   Wt 204 lb 4.8 oz (92.7 kg)   BMI 26.95 kg/m   EXAM: General appearance: alert and no distress Neck: no carotid bruit, no JVD, and thyroid not enlarged, symmetric, no tenderness/mass/nodules Lungs: clear to auscultation bilaterally Heart: regular rate and rhythm, S1, S2 normal, no murmur, click, rub or gallop Abdomen: soft, non-tender; bowel sounds normal; no masses,  no organomegaly Extremities: extremities normal, atraumatic, no cyanosis or edema Pulses: 2+ and symmetric Skin: Skin color, texture, turgor normal. No rashes or lesions Neurologic: Grossly normal Psych: Pleasant  EKG: Normal sinus rhythm at 67, LAFB-personally reviewed  ASSESSMENT: New left chest ache, fatigue and dyspnea Abnormal coronary CT angiogram showing calcium score of 1300, 91st percentile, subtotal occlusion of the RCA with significant LAD and circumflex disease, positive FFR in the proximal RCA (06/2021) -confirmed by left heart catheterization in November 2022,  medical management recommended Mixed dyslipidemia with high LDL cholesterol Possible familial hyperlipidemia-Dutch score 4 Strong family history of high cholesterol Statin intolerance, Zetia and fenofibrate intolerance-myalgias Abnormal CAC score of 1222, 93rd percentile for age and sex matched control (01/2020) Lung cancer status post wedge resection Small PFO-unknown significance  PLAN: 1.   Mr. Kun says that he has had progressive fatigue,  chest ache and dyspnea with exertion over the past couple of months which are significant.  He was noted to have significant coronary calcification and disease as outlined above on his heart catheterization.  No intervention was performed in October 2022.  I think noninvasive testing is not likely to be conclusive and likely he will have defects as he had high-grade distal circumflex and homo collateralized occluded RCA findings which would be abnormal on a Myoview.  He really needs a diagnostic catheterization again to see if there is anything that could potentially be intervened upon.  We discussed a repeat heart catheterization and he is agreeable to proceed. Will try to arrange for the week after Thanksgiving. If his chest pain worsens, he is advised to go to the emergency department.  Shared Decision Making/Informed Consent The risks [stroke (1 in 1000), death (1 in 1000), kidney failure [usually temporary] (1 in 500), bleeding (1 in 200), allergic reaction [possibly serious] (1 in 200)], benefits (diagnostic support and management of coronary artery disease) and alternatives of a cardiac catheterization were discussed in detail with Mr. Costanzo and he is willing to proceed.  Follow-up with me afterwards.  Pixie Casino, MD, [facility], [place] Director of the Advanced Lipid Disorders &  Cardiovascular Risk Reduction Clinic Diplomate of the American Board of Clinical Lipidology Attending Cardiologist  Direct Dial: [phone]  Fax: [phone]  Website:  [email or web address] 07/28/2022, 2:36 PM

## 2022-08-07 ENCOUNTER — Encounter (HOSPITAL_BASED_OUTPATIENT_CLINIC_OR_DEPARTMENT_OTHER): Payer: Self-pay | Admitting: Internal Medicine

## 2022-08-08 ENCOUNTER — Telehealth: Payer: Self-pay | Admitting: *Deleted

## 2022-08-08 LAB — COMPREHENSIVE METABOLIC PANEL
ALT: 27 IU/L (ref 0–44)
AST: 26 IU/L (ref 0–40)
Albumin/Globulin Ratio: 2.2 (ref 1.2–2.2)
Albumin: 4.6 g/dL (ref 3.9–4.9)
Alkaline Phosphatase: 76 IU/L (ref 44–121)
BUN/Creatinine Ratio: 19 (ref 10–24)
BUN: 22 mg/dL (ref 8–27)
Bilirubin Total: 0.4 mg/dL (ref 0.0–1.2)
CO2: 26 mmol/L (ref 20–29)
Calcium: 9.5 mg/dL (ref 8.6–10.2)
Chloride: 103 mmol/L (ref 96–106)
Creatinine, Ser: 1.14 mg/dL (ref 0.76–1.27)
Globulin, Total: 2.1 g/dL (ref 1.5–4.5)
Glucose: 95 mg/dL (ref 70–99)
Potassium: 4.9 mmol/L (ref 3.5–5.2)
Sodium: 141 mmol/L (ref 134–144)
Total Protein: 6.7 g/dL (ref 6.0–8.5)
eGFR: 70 mL/min/{1.73_m2} (ref >59)

## 2022-08-08 LAB — NMR, LIPOPROFILE
Cholesterol, Total: 143 mg/dL (ref 100–199)
HDL Particle Number: 26.6 umol/L — ABNORMAL LOW (ref >=30.5)
HDL-C: 39 mg/dL — ABNORMAL LOW (ref >39)
LDL Particle Number: 1047 nmol/L — ABNORMAL HIGH (ref <1000)
LDL Size: 20.8 nm (ref >20.5)
LDL-C (NIH Calc): 80 mg/dL (ref 0–99)
LP-IR Score: 72 — ABNORMAL HIGH (ref <=45)
Small LDL Particle Number: 428 nmol/L (ref <=527)
Triglycerides: 138 mg/dL (ref 0–149)

## 2022-08-08 LAB — CBC
Hematocrit: 47.1 % (ref 37.5–51.0)
Hemoglobin: 15.8 g/dL (ref 13.0–17.7)
MCH: 30 pg (ref 26.6–33.0)
MCHC: 33.5 g/dL (ref 31.5–35.7)
MCV: 90 fL (ref 79–97)
Platelets: 161 10*3/uL (ref 150–450)
RBC: 5.26 x10E6/uL (ref 4.14–5.80)
RDW: 14.2 % (ref 11.6–15.4)
WBC: 7.1 10*3/uL (ref 3.4–10.8)

## 2022-08-08 LAB — LIPOPROTEIN A (LPA): Lipoprotein (a): 171.2 nmol/L — ABNORMAL HIGH (ref <75.0)

## 2022-08-08 NOTE — Telephone Encounter (Signed)
Cardiac Catheterization scheduled at [facility] for: Wednesday August 09, 2022 8:30 AM Arrival time and place: [place] Entrance A at: 6:30 AM  Nothing to eat after midnight prior to procedure, clear liquids until 5 AM day of procedure.  Medication instructions: -Usual morning medications can be taken with sips of water including aspirin 81 mg.  Confirmed patient has responsible adult to drive home post procedure and be with patient first 24 hours after arriving home.  Patient reports no new symptoms concerning for COVID-19 in the past 10 days.  Reviewed procedure instructions with patient.

## 2022-08-09 ENCOUNTER — Encounter (HOSPITAL_COMMUNITY)
Admission: RE | Disposition: A | Discharge status: Home / Self Care | Payer: Self-pay | Source: Home / Self Care | Attending: Internal Medicine

## 2022-08-09 ENCOUNTER — Ambulatory Visit (HOSPITAL_COMMUNITY)
Admission: RE | Admit: 2022-08-09 | Discharge: 2022-08-09 | Disposition: A | Discharge status: Home / Self Care | Payer: Medicare Other | Attending: Internal Medicine | Admitting: Internal Medicine

## 2022-08-09 ENCOUNTER — Other Ambulatory Visit: Payer: Self-pay

## 2022-08-09 ENCOUNTER — Encounter (HOSPITAL_COMMUNITY): Payer: Self-pay | Admitting: Internal Medicine

## 2022-08-09 DIAGNOSIS — Z8349 Family history of other endocrine, nutritional and metabolic diseases: Secondary | ICD-10-CM | POA: Insufficient documentation

## 2022-08-09 DIAGNOSIS — Z79899 Other long term (current) drug therapy: Secondary | ICD-10-CM | POA: Diagnosis not present

## 2022-08-09 DIAGNOSIS — I25110 Atherosclerotic heart disease of native coronary artery with unstable angina pectoris: Secondary | ICD-10-CM | POA: Diagnosis not present

## 2022-08-09 DIAGNOSIS — Z87891 Personal history of nicotine dependence: Secondary | ICD-10-CM | POA: Diagnosis not present

## 2022-08-09 DIAGNOSIS — I2582 Chronic total occlusion of coronary artery: Secondary | ICD-10-CM | POA: Diagnosis not present

## 2022-08-09 DIAGNOSIS — E782 Mixed hyperlipidemia: Secondary | ICD-10-CM | POA: Insufficient documentation

## 2022-08-09 DIAGNOSIS — R0600 Dyspnea, unspecified: Secondary | ICD-10-CM | POA: Insufficient documentation

## 2022-08-09 DIAGNOSIS — Q2112 Patent foramen ovale: Secondary | ICD-10-CM | POA: Diagnosis not present

## 2022-08-09 DIAGNOSIS — Z85118 Personal history of other malignant neoplasm of bronchus and lung: Secondary | ICD-10-CM | POA: Insufficient documentation

## 2022-08-09 DIAGNOSIS — I200 Unstable angina: Secondary | ICD-10-CM

## 2022-08-09 DIAGNOSIS — I2510 Atherosclerotic heart disease of native coronary artery without angina pectoris: Secondary | ICD-10-CM

## 2022-08-09 HISTORY — PX: INTRAVASCULAR PRESSURE WIRE/FFR STUDY: CATH118243

## 2022-08-09 HISTORY — PX: LEFT HEART CATH AND CORONARY ANGIOGRAPHY: CATH118249

## 2022-08-09 LAB — POCT ACTIVATED CLOTTING TIME
Activated Clotting Time: 212 seconds
Activated Clotting Time: 298 seconds

## 2022-08-09 SURGERY — LEFT HEART CATH AND CORONARY ANGIOGRAPHY
Anesthesia: LOCAL

## 2022-08-09 MED ORDER — FENTANYL CITRATE (PF) 100 MCG/2ML IJ SOLN
INTRAMUSCULAR | Status: AC
  Filled 2022-08-09: qty 2

## 2022-08-09 MED ORDER — SODIUM CHLORIDE 0.9% FLUSH: 3.0000 mL | INTRAVENOUS | Status: DC | PRN

## 2022-08-09 MED ORDER — SODIUM CHLORIDE 0.9 % IV SOLN: 250.0000 mL | INTRAVENOUS | Status: DC | PRN

## 2022-08-09 MED ORDER — LIDOCAINE HCL (PF) 1 % IJ SOLN
INTRAMUSCULAR | Status: AC
  Filled 2022-08-09: qty 30

## 2022-08-09 MED ORDER — SODIUM CHLORIDE 0.9 % WEIGHT BASED INFUSION
3.0000 mL/kg/h | INTRAVENOUS | Status: AC
  Administered 2022-08-09: 3 mL/kg/h via INTRAVENOUS

## 2022-08-09 MED ORDER — IOHEXOL 350 MG/ML SOLN
INTRAVENOUS | Status: DC | PRN
  Administered 2022-08-09: 95 mL

## 2022-08-09 MED ORDER — SODIUM CHLORIDE 0.9% FLUSH: 3.0000 mL | Freq: Two times a day (BID) | INTRAVENOUS | Status: DC

## 2022-08-09 MED ORDER — VERAPAMIL HCL 2.5 MG/ML IV SOLN
INTRAVENOUS | Status: AC
  Filled 2022-08-09: qty 2

## 2022-08-09 MED ORDER — SODIUM CHLORIDE 0.9 % IV SOLN: INTRAVENOUS | Status: DC

## 2022-08-09 MED ORDER — LIDOCAINE HCL (PF) 1 % IJ SOLN
INTRAMUSCULAR | Status: DC | PRN
  Administered 2022-08-09: 2 mL

## 2022-08-09 MED ORDER — HEPARIN (PORCINE) IN NACL 1000-0.9 UT/500ML-% IV SOLN
INTRAVENOUS | Status: DC | PRN
  Administered 2022-08-09 (×2): 500 mL

## 2022-08-09 MED ORDER — VERAPAMIL HCL 2.5 MG/ML IV SOLN
INTRAVENOUS | Status: DC | PRN
  Administered 2022-08-09: 10 mL via INTRA_ARTERIAL

## 2022-08-09 MED ORDER — FENTANYL CITRATE (PF) 100 MCG/2ML IJ SOLN
INTRAMUSCULAR | Status: DC | PRN
  Administered 2022-08-09 (×2): 25 ug via INTRAVENOUS

## 2022-08-09 MED ORDER — HYDRALAZINE HCL 20 MG/ML IJ SOLN: 10.0000 mg | INTRAMUSCULAR | Status: DC | PRN

## 2022-08-09 MED ORDER — HEPARIN SODIUM (PORCINE) 1000 UNIT/ML IJ SOLN
INTRAMUSCULAR | Status: AC
  Filled 2022-08-09: qty 10

## 2022-08-09 MED ORDER — ONDANSETRON HCL 4 MG/2ML IJ SOLN: 4.0000 mg | Freq: Four times a day (QID) | INTRAMUSCULAR | Status: DC | PRN

## 2022-08-09 MED ORDER — SODIUM CHLORIDE 0.9 % WEIGHT BASED INFUSION: 1.0000 mL/kg/h | INTRAVENOUS | Status: DC

## 2022-08-09 MED ORDER — MIDAZOLAM HCL 2 MG/2ML IJ SOLN
INTRAMUSCULAR | Status: AC
  Filled 2022-08-09: qty 2

## 2022-08-09 MED ORDER — LABETALOL HCL 5 MG/ML IV SOLN: 10.0000 mg | INTRAVENOUS | Status: DC | PRN

## 2022-08-09 MED ORDER — HEPARIN SODIUM (PORCINE) 1000 UNIT/ML IJ SOLN
INTRAMUSCULAR | Status: DC | PRN
  Administered 2022-08-09: 4000 [IU] via INTRAVENOUS
  Administered 2022-08-09: 5000 [IU] via INTRA_ARTERIAL
  Administered 2022-08-09: 3000 [IU] via INTRAVENOUS

## 2022-08-09 MED ORDER — ASPIRIN 81 MG PO CHEW: 81.0000 mg | CHEWABLE_TABLET | ORAL | Status: DC

## 2022-08-09 MED ORDER — ACETAMINOPHEN 325 MG PO TABS: 650.0000 mg | ORAL_TABLET | ORAL | Status: DC | PRN

## 2022-08-09 MED ORDER — MIDAZOLAM HCL 2 MG/2ML IJ SOLN
INTRAMUSCULAR | Status: DC | PRN
  Administered 2022-08-09 (×2): 1 mg via INTRAVENOUS

## 2022-08-09 MED ORDER — ADENOSINE 12 MG/4ML IV SOLN
INTRAVENOUS | Status: AC
  Filled 2022-08-09: qty 16

## 2022-08-09 MED ORDER — HEPARIN (PORCINE) IN NACL 1000-0.9 UT/500ML-% IV SOLN
INTRAVENOUS | Status: AC
  Filled 2022-08-09: qty 1000

## 2022-08-09 SURGICAL SUPPLY — 13 items
CATH INFINITI 5FR ANG PIGTAIL (CATHETERS) IMPLANT
CATH LAUNCHER 6FR EBU3.5 (CATHETERS) IMPLANT
CATH OPTITORQUE TIG 4.0 6F (CATHETERS) IMPLANT
DEVICE RAD COMP TR BAND LRG (VASCULAR PRODUCTS) IMPLANT
GLIDESHEATH SLEND SS 6F .021 (SHEATH) IMPLANT
GUIDEWIRE INQWIRE 1.5J.035X260 (WIRE) IMPLANT
GUIDEWIRE PRESSURE X 175 (WIRE) IMPLANT
INQWIRE 1.5J .035X260CM (WIRE) ×1
KIT ESSENTIALS PG (KITS) IMPLANT
KIT HEART LEFT (KITS) ×1 IMPLANT
PACK CARDIAC CATHETERIZATION (CUSTOM PROCEDURE TRAY) ×1 IMPLANT
TRANSDUCER W/STOPCOCK (MISCELLANEOUS) ×1 IMPLANT
TUBING CIL FLEX 10 FLL-RA (TUBING) ×1 IMPLANT

## 2022-08-09 NOTE — Progress Notes (Signed)
TR band removed, new dressing placed along w/ arm brace. No s/s of bleeding or hematoma noted. will continue to monitor.

## 2022-08-09 NOTE — Interval H&P Note (Signed)
History and Physical Interval Note:  08/09/2022 4:62 AM  [name]  has presented today for surgery, with the diagnosis of unstable angina.  The various methods of treatment have been discussed with the patient and family. After consideration of risks, benefits and other options for treatment, the patient has consented to  Procedure(s): LEFT HEART CATH AND CORONARY ANGIOGRAPHY (N/A) as a surgical intervention.  The patient's history has been reviewed, patient examined, no change in status, stable for surgery.  I have reviewed the patient's chart and labs.  Questions were answered to the patient's satisfaction.    Cath Lab Visit (complete for each Cath Lab visit)  Clinical Evaluation Leading to the Procedure:   ACS: No.  Non-ACS:    Anginal Classification: CCS III  Anti-ischemic medical therapy: Minimal Therapy (1 class of medications)  Non-Invasive Test Results: No non-invasive testing performed  Prior CABG: No previous CABG        Early Osmond

## 2022-08-10 ENCOUNTER — Encounter (HOSPITAL_BASED_OUTPATIENT_CLINIC_OR_DEPARTMENT_OTHER): Payer: Self-pay | Admitting: Internal Medicine

## 2022-08-24 NOTE — H&P (Signed)
Patient's anticipated LOS is less than 2 midnights, meeting these requirements: - Younger than 20 - Lives within 1 hour of care - Has a competent adult at home to recover with post-op recover - NO history of  - Chronic pain requiring opiods  - Diabetes  - Coronary Artery Disease  - Heart failure  - Heart attack  - Stroke  - DVT/VTE  - Cardiac arrhythmia  - Respiratory Failure/COPD  - Renal failure  - Anemia  - Advanced Liver disease     [name] [name] is an 71 y.o. male.    Chief Complaint: left shoulder pain  HPI: Pt is a 71 y.o. male complaining of left shoulder pain for multiple years. Pain had continually increased since the beginning. X-rays in the clinic show end-stage arthritic changes of the left shoulder. Pt has tried various conservative treatments which have failed to alleviate their symptoms, including injections and therapy. Various options are discussed with the patient. Risks, benefits and expectations were discussed with the patient. Patient understand the risks, benefits and expectations and wishes to proceed with surgery.   PCP:  Gaynelle Arabian, MD  D/C Plans: Home  PMH: Past Medical History:  Diagnosis Date   Arthritis    Carpal tunnel syndrome    Right   Cataracts, bilateral    very mild   Early cataracts, bilateral    Fatty liver    Gallstones    GERD (gastroesophageal reflux disease)    Headache 2011   HX OF VISUAL MIGRAINES - NO PAIN --MIGRAINES ATTRIBUTED TO PT CUTTING NICOTINE PATCH IN HALF BEFORE APPLYING TO HIS SKIN.     Hepatitis 2001   C -treated and no problems since  2001 'cured'   History of hiatal hernia    History of kidney stones    found on CT Scan   HOH (hard of hearing)    lung ca dx'd 12/06/19   RIGHT LUNG   Muscle cramps    hands and legs   Osteopenia    Pain    KNEE OA   Right lower lobe lung mass    Shortness of breath dyspnea    "SOMETIMES FEELS LIKE I CAN'T CATCH A DEEP BREATH "- ON GOING FOR YEARS - STATES  MEDICAL DOCTOR SENT HIM FOR CARDIAC WORK UP 2011 - [place]   Stroke ([place])    HX POSSIBLE TIA - RT LEG NUMBNESS AND TINGLING FINGERS AND NUMBNESS RT FACE - ALL RESOLVED ; STATES NEUROLOGIST COULD NOT FIND ANY EVIDENCE OF STROKE - THOUGHT PT'S EPISODE MIGHT BE FROM CUTTING NICOTINE PATCH IN HALF AND APPLYING TO SKIN   Trigger finger    LEFT MIDDLE FINGER   Wears glasses     PSH: Past Surgical History:  Procedure Laterality Date   BRONCHIAL BIOPSY  02/24/2020   Procedure: BRONCHIAL BIOPSIES;  Surgeon: Collene Gobble, MD;  Location: [facility];  Service: Pulmonary;;   BRONCHIAL BRUSHINGS  02/24/2020   Procedure: BRONCHIAL BRUSHINGS;  Surgeon: Collene Gobble, MD;  Location: [place];  Service: Pulmonary;;   BRONCHIAL NEEDLE ASPIRATION BIOPSY  02/24/2020   Procedure: BRONCHIAL NEEDLE ASPIRATION BIOPSIES;  Surgeon: Collene Gobble, MD;  Location: [place];  Service: Pulmonary;;   BRONCHIAL WASHINGS  02/24/2020   Procedure: BRONCHIAL WASHINGS;  Surgeon: Collene Gobble, MD;  Location: [facility];  Service: Pulmonary;;   COLONOSCOPY     INTERCOSTAL NERVE BLOCK Right 04/28/2020   Procedure: INTERCOSTAL NERVE BLOCK;  Surgeon: Lajuana Matte, MD;  Location: [facility];  Service: Thoracic;  Laterality: Right;   INTRAVASCULAR PRESSURE WIRE/FFR STUDY N/A 08/09/2022   Procedure: INTRAVASCULAR PRESSURE WIRE/FFR STUDY;  Surgeon: Early Osmond, MD;  Location: [place] [facility];  Service: Cardiovascular;  Laterality: N/A;   left ankle surgery     fractured   LEFT HEART CATH AND CORONARY ANGIOGRAPHY N/A 06/29/2021   Procedure: LEFT HEART CATH AND CORONARY ANGIOGRAPHY;  Surgeon: Early Osmond, MD;  Location: [place] [facility];  Service: Cardiovascular;  Laterality: N/A;   LEFT HEART CATH AND CORONARY ANGIOGRAPHY N/A 08/09/2022   Procedure: LEFT HEART CATH AND CORONARY ANGIOGRAPHY;  Surgeon: Early Osmond, MD;  Location: [place] [facility];  Service: Cardiovascular;  Laterality:  N/A;   left knee surgery     MENISCAL REMOVED   left shoulder     torn ligaments left shoulder   LUMBAR FUSION     L3 L4 L5   NODE DISSECTION Right 04/28/2020   Procedure: NODE DISSECTION;  Surgeon: Lajuana Matte, MD;  Location: [employer];  Service: Thoracic;  Laterality: Right;   RIGHT CARPAL TUNNEL RELEASE  2011   RIGHT KNEE ARTHROSCOPY   AUG OR SEPT 2015   AT [place] Right 04/28/2020   XI ROBOTIC ASSISTED THORASCOPY-LOBECTOMY (Right)   TONSILLECTOMY     as child   TOTAL KNEE ARTHROPLASTY Left 07/14/2013   Procedure: LEFT TOTAL KNEE ARTHROPLASTY;  Surgeon: Mauri Pole, MD;  Location: [facility];  Service: Orthopedics;  Laterality: Left;   TOTAL KNEE ARTHROPLASTY Right 07/21/2014   Procedure: RIGHT TOTAL KNEE ARTHROPLASTY;  Surgeon: Mauri Pole, MD;  [facility];  Service: Orthopedics;  Laterality: Right;   TOTAL SHOULDER ARTHROPLASTY Right 10/17/2019   Procedure: RIGHT REVERSE TOTAL SHOULDER REPLACEMENT;  Surgeon: Netta Cedars, MD;  [facility];  Service: Orthopedics;  Laterality: Right;  interscalene block   TRIGGER FINGER RELEASE Left    VIDEO BRONCHOSCOPY WITH ENDOBRONCHIAL NAVIGATION Right 02/24/2020   Procedure: VIDEO BRONCHOSCOPY WITH ENDOBRONCHIAL NAVIGATION;  Surgeon: Collene Gobble, MD;  Location[facility];  Service: Pulmonary;  Laterality: Right;   WEDGE RESECTION Right 04/28/2020   XI ROBOTIC ASSISTED THORASCOPY-RIGHT LOWER LOBE WEDGE RESECTION (Right Chest)     Social History:  reports that he quit smoking about 10 years ago. His smoking use included cigarettes. He has never used smokeless tobacco. He reports that he does not currently use alcohol after a past usage of about 7.0 standard drinks of alcohol per week. He reports that he does not currently use drugs after having used the following drugs: Marijuana. BMI: Estimated body mass index is 25.07 kg/m as calculated from the following:   Height as of  08/09/22: 6\' 1"  (1.854 m).   Weight as of 08/09/22: 86.2 kg.  Lab Results  Component Value Date   ALBUMIN 4.6 08/07/2022   Diabetes: Patient does not have a diagnosis of diabetes.     Smoking Status:      Allergies:  Allergies  Allergen Reactions   Statins Other (See Comments)    MYALGIAS   Chantix [Varenicline] Other (See Comments)    Ineffective; patient did not like the way it made him feel   Tricor [Fenofibrate] Other (See Comments)    ineffective for cholesterol (2014)   Zetia [Ezetimibe] Other (See Comments)    ineffective for cholesterol    Medications: No current facility-administered medications for this encounter.   Current  Outpatient Medications  Medication Sig Dispense Refill   Alirocumab (PRALUENT) 150 MG/ML SOAJ INJECT 1 DOSE INTO THE SKIN EVERY 14 (FOURTEEN) DAYS. (Patient taking differently: Inject 1 Dose into the skin every 14 (fourteen) days. Thursdays) 6 mL 3   aspirin EC 81 MG tablet Take 81 mg by mouth in the morning. Swallow whole.     clindamycin (CLEOCIN) 150 MG capsule Take 600 mg by mouth See admin instructions. Take 4 capsules (600 mg) by mouth prior to dental appointments     cyclobenzaprine (FLEXERIL) 10 MG tablet Take 10 mg by mouth 3 (three) times daily.     donepezil (ARICEPT) 10 MG tablet Take 10 mg by mouth in the morning.     HYDROcodone-acetaminophen (NORCO/VICODIN) 5-325 MG tablet Take 1.5 tablets by mouth every 6 (six) hours as needed for moderate pain.     ibuprofen (ADVIL) 200 MG tablet Take 600 mg by mouth every 6 (six) hours as needed (pain.).     loratadine (CLARITIN) 10 MG tablet Take 10 mg by mouth daily as needed for allergies.     Magnesium 500 MG TABS Take 500 mg by mouth in the morning.     NON FORMULARY Place 1 drop into the right eye in the morning, at noon, in the evening, and at bedtime. Eye drop     sertraline (ZOLOFT) 100 MG tablet Take 100 mg by mouth in the morning.     tamsulosin (FLOMAX) 0.4 MG CAPS capsule Take 0.4  mg by mouth in the morning.      No results found for this or any previous visit (from the past 48 hour(s)). No results found.  ROS: Pain with rom of the left upper extremity  Physical Exam: Alert and oriented 71 y.o. male in no acute distress Cranial nerves 2-12 intact Cervical spine: full rom with no tenderness, nv intact distally Chest: active breath sounds bilaterally, no wheeze rhonchi or rales Heart: regular rate and rhythm, no murmur Abd: non tender non distended with active bowel sounds Hip is stable with rom  Left shoulder painful and weak rom Nv intact distally No rashes or edema distally  Assessment/Plan Assessment: left shoulder cuff arthropathy  Plan:  Patient will undergo a left reverse total shoulder by Dr. Veverly Fells at [place] Risks benefits and expectations were discussed with the patient. Patient understand risks, benefits and expectations and wishes to proceed. Preoperative templating of the joint replacement has been completed, documented, and submitted to the Operating Room personnel in order to optimize intra-operative equipment management.   Merla Riches PA-C, MPAS [facility] is now [employer] [address], [place], [place],  [address] Phone: [phone] [email or web address] Facebook  [employer]

## 2022-08-28 ENCOUNTER — Ambulatory Visit (INDEPENDENT_AMBULATORY_CARE_PROVIDER_SITE_OTHER): Payer: Medicare Other | Admitting: Family

## 2022-08-28 ENCOUNTER — Encounter (HOSPITAL_BASED_OUTPATIENT_CLINIC_OR_DEPARTMENT_OTHER): Payer: Self-pay | Admitting: Family

## 2022-08-28 VITALS — BP 132/84 | HR 82 | Ht 73.0 in | Wt 198.0 lb

## 2022-08-28 DIAGNOSIS — E559 Vitamin D deficiency, unspecified: Secondary | ICD-10-CM

## 2022-08-28 DIAGNOSIS — R5383 Other fatigue: Secondary | ICD-10-CM | POA: Diagnosis not present

## 2022-08-28 DIAGNOSIS — R42 Dizziness and giddiness: Secondary | ICD-10-CM

## 2022-08-28 DIAGNOSIS — R200 Anesthesia of skin: Secondary | ICD-10-CM | POA: Diagnosis not present

## 2022-08-28 DIAGNOSIS — E785 Hyperlipidemia, unspecified: Secondary | ICD-10-CM

## 2022-08-28 DIAGNOSIS — R202 Paresthesia of skin: Secondary | ICD-10-CM

## 2022-08-28 DIAGNOSIS — I25118 Atherosclerotic heart disease of native coronary artery with other forms of angina pectoris: Secondary | ICD-10-CM | POA: Diagnosis not present

## 2022-08-28 NOTE — Patient Instructions (Signed)
Medication Instructions:  Your Physician recommend you continue on your current medication as directed.    *If you need a refill on your cardiac medications before your next appointment, please call your pharmacy*   Lab Work: Your physician recommends that you return for lab work today- B12 and Vitmamin D   If you have labs (blood work) drawn today and your tests are completely normal, you will receive your results only by: [place] (if you have MyChart) OR A paper copy in the mail If you have any lab test that is abnormal or we need to change your treatment, we will call you to review the results.   Testing/Procedures: Your physician has requested that you have an echocardiogram. Echocardiography is a painless test that uses sound waves to create images of your heart. It provides your doctor with information about the size and shape of your heart and how well your heart's chambers and valves are working. This procedure takes approximately one hour. There are no restrictions for this procedure. Please do NOT wear cologne, perfume, aftershave, or lotions (deodorant is allowed). Please arrive 15 minutes prior to your appointment time.  Your physician has requested that you have a carotid duplex. This test is an ultrasound of the carotid arteries in your neck. It looks at blood flow through these arteries that supply the brain with blood. Allow one hour for this exam. There are no restrictions or special instructions.    Follow-Up: At [facility], you and your health needs are our priority.  As part of our continuing mission to provide you with exceptional heart care, we have created designated Provider Care Teams.  These Care Teams include your primary Cardiologist (physician) and Advanced Practice Providers (APPs -  Physician Assistants and Nurse Practitioners) who all work together to provide you with the care you need, when you need it.  We recommend signing up for the  patient portal called "MyChart".  Sign up information is provided on this After Visit Summary.  MyChart is used to connect with patients for Virtual Visits (Telemedicine).  Patients are able to view lab/test results, encounter notes, upcoming appointments, etc.  Non-urgent messages can be sent to your provider as well.   To learn more about what you can do with MyChart, go to [email or web address].    Your next appointment:   3 month(s)  The format for your next appointment:   In Person  Provider:   Raliegh Ip Mali Hilty, MD or Laurann Montana, NP

## 2022-08-28 NOTE — Progress Notes (Unsigned)
Office Visit    Patient Name: [name] [name] Date of Encounter: 08/28/2022  PCP:  Gaynelle Arabian, [place]  Cardiologist:  Pixie Casino, MD *** Advanced Practice Provider:  No care team member to display Electrophysiologist:  None  {Press F2 to show EP APP, CHF, sleep or structural heart MD               :[id number]}  { Click here to update then REFRESH NOTE - MD (PCP) or APP (Team Member)  Change PCP Type for MD, Specialty for APP is either Cardiology or Clinical Cardiac Electrophysiology  :[id number]}  Chief Complaint    [name] [name] is a 71 y.o. male presents today for ***   Past Medical History    Past Medical History:  Diagnosis Date   Arthritis    Carpal tunnel syndrome    Right   Cataracts, bilateral    very mild   Early cataracts, bilateral    Fatty liver    Gallstones    GERD (gastroesophageal reflux disease)    Headache 2011   HX OF VISUAL MIGRAINES - NO PAIN --MIGRAINES ATTRIBUTED TO PT CUTTING NICOTINE PATCH IN HALF BEFORE APPLYING TO HIS SKIN.     Hepatitis 2001   C -treated and no problems since  2001 'cured'   History of hiatal hernia    History of kidney stones    found on CT Scan   HOH (hard of hearing)    lung ca dx'd 12/06/19   RIGHT LUNG   Muscle cramps    hands and legs   Osteopenia    Pain    KNEE OA   Right lower lobe lung mass    Shortness of breath dyspnea    "SOMETIMES FEELS LIKE I CAN'T CATCH A DEEP BREATH "- ON GOING FOR YEARS - STATES MEDICAL DOCTOR SENT HIM FOR CARDIAC WORK UP 2011 - [place]   Stroke ([place])    HX POSSIBLE TIA - RT LEG NUMBNESS AND TINGLING FINGERS AND NUMBNESS RT FACE - ALL RESOLVED ; STATES NEUROLOGIST COULD NOT FIND ANY EVIDENCE OF STROKE - THOUGHT PT'S EPISODE MIGHT BE FROM CUTTING NICOTINE PATCH IN HALF AND APPLYING TO SKIN   Trigger finger    LEFT MIDDLE FINGER   Wears glasses    Past Surgical History:  Procedure Laterality Date   BRONCHIAL BIOPSY  02/24/2020    Procedure: BRONCHIAL BIOPSIES;  Surgeon: Collene Gobble, MD;  Location: [place] [facility];  Service: Pulmonary;;   BRONCHIAL BRUSHINGS  02/24/2020   Procedure: BRONCHIAL BRUSHINGS;  Surgeon: Collene Gobble, MD;  Location: [place];  Service: Pulmonary;;   BRONCHIAL NEEDLE ASPIRATION BIOPSY  02/24/2020   Procedure: BRONCHIAL NEEDLE ASPIRATION BIOPSIES;  Surgeon: Collene Gobble, MD;  Location: [place];  Service: Pulmonary;;   BRONCHIAL WASHINGS  02/24/2020   Procedure: BRONCHIAL WASHINGS;  Surgeon: Collene Gobble, MD;  Location: [facility];  Service: Pulmonary;;   COLONOSCOPY     INTERCOSTAL NERVE BLOCK Right 04/28/2020   Procedure: INTERCOSTAL NERVE BLOCK;  Surgeon: Lajuana Matte, MD;  Location: [place];  Service: Thoracic;  Laterality: Right;   INTRAVASCULAR PRESSURE WIRE/FFR STUDY N/A 08/09/2022   Procedure: INTRAVASCULAR PRESSURE WIRE/FFR STUDY;  Surgeon: Early Osmond, MD;  Location: [place] [facility];  Service: Cardiovascular;  Laterality: N/A;   left ankle surgery     fractured   LEFT HEART CATH AND CORONARY ANGIOGRAPHY N/A 06/29/2021   Procedure:  LEFT HEART CATH AND CORONARY ANGIOGRAPHY;  Surgeon: Early Osmond, MD;  Location: [place] [facility];  Service: Cardiovascular;  Laterality: N/A;   LEFT HEART CATH AND CORONARY ANGIOGRAPHY N/A 08/09/2022   Procedure: LEFT HEART CATH AND CORONARY ANGIOGRAPHY;  Surgeon: Early Osmond, MD;  Location: [place] [facility];  Service: Cardiovascular;  Laterality: N/A;   left knee surgery     MENISCAL REMOVED   left shoulder     torn ligaments left shoulder   LUMBAR FUSION     L3 L4 L5   NODE DISSECTION Right 04/28/2020   Procedure: NODE DISSECTION;  Surgeon: Lajuana Matte, MD;  Location: [place];  Service: Thoracic;  Laterality: Right;   RIGHT CARPAL TUNNEL RELEASE  2011   RIGHT KNEE ARTHROSCOPY   AUG OR SEPT 2015   AT [place] Right 04/28/2020   XI ROBOTIC ASSISTED  THORASCOPY-LOBECTOMY (Right)   TONSILLECTOMY     as child   TOTAL KNEE ARTHROPLASTY Left 07/14/2013   Procedure: LEFT TOTAL KNEE ARTHROPLASTY;  Surgeon: Mauri Pole, MD;  [facility];  Service: Orthopedics;  Laterality: Left;   TOTAL KNEE ARTHROPLASTY Right 07/21/2014   Procedure: RIGHT TOTAL KNEE ARTHROPLASTY;  Surgeon: Mauri Pole, MD;  [facility];  Service: Orthopedics;  Laterality: Right;   TOTAL SHOULDER ARTHROPLASTY Right 10/17/2019   Procedure: RIGHT REVERSE TOTAL SHOULDER REPLACEMENT;  Surgeon: Netta Cedars, MD;  [facility];  Service: Orthopedics;  Laterality: Right;  interscalene block   TRIGGER FINGER RELEASE Left    VIDEO BRONCHOSCOPY WITH ENDOBRONCHIAL NAVIGATION Right 02/24/2020   Procedure: VIDEO BRONCHOSCOPY WITH ENDOBRONCHIAL NAVIGATION;  Surgeon: Collene Gobble, MD;  Location[facility];  Service: Pulmonary;  Laterality: Right;   WEDGE RESECTION Right 04/28/2020   XI ROBOTIC ASSISTED THORASCOPY-RIGHT LOWER LOBE WEDGE RESECTION (Right Chest)     Allergies  Allergies  Allergen Reactions   Statins Other (See Comments)    MYALGIAS   Chantix [Varenicline] Other (See Comments)    Ineffective; patient did not like the way it made him feel   Tricor [Fenofibrate] Other (See Comments)    ineffective for cholesterol (2014)   Zetia [Ezetimibe] Other (See Comments)    ineffective for cholesterol    History of Present Illness    [name] [name] is a 71 y.o. male with a hx of *** last seen ***.  Notes since his procedure feels worse. Notes exhaustion with minimal effort. Now when waking feels a "vertigo" off balance. Describes as a whole body tingle. Notes fine motion are difficult. Bilateral hands with numbness in his fingers. Having difficulty grasping small screws for a project. His arms and legs feel "leaden". Reports he can't "walk smoothly" and feels he is walking like he is drunk. This has been ongoing over the last month and feels overall about the  same. He thinks he has done nerve testing before about 2 years ago.   Spinal surgeon Dr. Ellene Route who has done work on his lower back. He was previously known to have some issues with cervical vertebrae.   Wakes up feeling tired.   No sleep apnea per sleep study 8-10 years.   Occasional left sided chest discomfort which is tender on palpation.   EKGs/Labs/Other Studies Reviewed:   The following studies were reviewed today:  LHC 08/09/22   Ost LAD lesion is 15% stenosed.   Mid LAD lesion is 20% stenosed.   Dist  Cx lesion is 90% stenosed.   Prox RCA lesion is 100% stenosed.   1st Mrg lesion is 40% stenosed.   1.  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   2.  RFR assessment of the first obtuse marginal was 0.92; medical management should be pursued. 3.  LVEDP of 10 mmHg.   Recommendation: Medical management.  EKG:  EKG is not ordered today.    Recent Labs: 12/19/2021: TSH 0.784 08/07/2022: ALT 27; BUN 22; Creatinine, Ser 1.14; Hemoglobin 15.8; Platelets 161; Potassium 4.9; Sodium 141  Recent Lipid Panel    Component Value Date/Time   CHOL 153 12/12/2021 1534   TRIG 126 12/12/2021 1534   HDL 53 12/12/2021 1534   CHOLHDL 2.9 12/12/2021 1534   CHOLHDL 5.6 10/31/2007 0830   VLDL 26 10/31/2007 0830   LDLCALC 78 12/12/2021 1534   Home Medications   Current Meds  Medication Sig   Alirocumab (PRALUENT) 150 MG/ML SOAJ INJECT 1 DOSE INTO THE SKIN EVERY 14 (FOURTEEN) DAYS. (Patient taking differently: Inject 1 Dose into the skin every 14 (fourteen) days. Thursdays)   aspirin EC 81 MG tablet Take 81 mg by mouth in the morning. Swallow whole.   clindamycin (CLEOCIN) 150 MG capsule Take 600 mg by mouth See admin instructions. Take 4 capsules (600 mg) by mouth prior to dental appointments    cyclobenzaprine (FLEXERIL) 10 MG tablet Take 10 mg by mouth 3 (three) times daily.   donepezil (ARICEPT) 10 MG tablet Take 10 mg by mouth in the morning.   HYDROcodone-acetaminophen (NORCO/VICODIN) 5-325 MG tablet Take 1.5 tablets by mouth every 6 (six) hours as needed for moderate pain.   ibuprofen (ADVIL) 200 MG tablet Take 600 mg by mouth every 6 (six) hours as needed (pain.).   Magnesium 500 MG TABS Take 500 mg by mouth in the morning.   sertraline (ZOLOFT) 100 MG tablet Take 100 mg by mouth in the morning.   tamsulosin (FLOMAX) 0.4 MG CAPS capsule Take 0.4 mg by mouth in the morning.     Review of Systems      All other systems reviewed and are otherwise negative except as noted above.  Physical Exam    VS:  There were no vitals taken for this visit. , BMI There is no height or weight on file to calculate BMI.  Wt Readings from Last 3 Encounters:  08/09/22 190 lb (86.2 kg)  07/28/22 204 lb 4.8 oz (92.7 kg)  04/19/22 207 lb 12.8 oz (94.3 kg)     GEN: Well nourished, well developed, in no acute distress. HEENT: normal. Neck: Supple, no JVD, carotid bruits, or masses. Cardiac: ***RRR, no murmurs, rubs, or gallops. No clubbing, cyanosis, edema.  ***Radials/PT 2+ and equal bilaterally.  Respiratory:  ***Respirations regular and unlabored, clear to auscultation bilaterally. GI: Soft, nontender, nondistended. MS: No deformity or atrophy. Skin: Warm and dry, no rash. Neuro:  Strength and sensation are intact. Psych: Normal affect.  Assessment & Plan    Preop -  CAD - GDMT aspirin, prealuent, ***.  HLD, LDL goal <70 - intolerant to Zetia, statin, feonfibrate with myalgias. Tolerates Praluent.    No BP recorded.  {Refresh Note OR Click here to enter BP  :1}***      Disposition: Follow up {follow up:15908} with Pixie Casino, MD or APP.  Signed, Urban Gibson  Thomes Dinning, NP 08/28/2022, 11:03 AM [place]

## 2022-08-30 ENCOUNTER — Encounter (HOSPITAL_BASED_OUTPATIENT_CLINIC_OR_DEPARTMENT_OTHER): Payer: Self-pay | Admitting: Family

## 2022-09-07 LAB — VITAMIN D 1,25 DIHYDROXY
Vitamin D 1, 25 (OH)2 Total: 56 pg/mL
Vitamin D2 1, 25 (OH)2: <10 pg/mL
Vitamin D3 1, 25 (OH)2: 53 pg/mL

## 2022-09-07 LAB — B12 AND FOLATE PANEL
Folate: 5.7 ng/mL (ref >3.0)
Vitamin B-12: 1137 pg/mL (ref 232–1245)

## 2022-09-07 NOTE — Patient Instructions (Addendum)
SURGICAL WAITING ROOM VISITATION  Patients having surgery or a procedure may have no more than 2 support people in the waiting area - these visitors may rotate.    Children under the age of 43 must have an adult with them who is not the patient.  Due to an increase in RSV and influenza rates and associated hospitalizations, children ages 16 and under may not visit patients in [place].  If the patient needs to stay at the hospital during part of their recovery, the visitor guidelines for inpatient rooms apply. Pre-op nurse will coordinate an appropriate time for 1 support person to accompany patient in pre-op.  This support person may not rotate.    Please refer to the [facility] website for the visitor guidelines for Inpatients (after your surgery is over and you are in a regular room).    Your procedure is scheduled on: 09/22/22   Report to [facility] Main Entrance    Report to admitting at 5:15 AM   Call this number if you have problems the morning of surgery [phone]   Do not eat food :After Midnight.   After Midnight you may have the following liquids until 4:30 AM DAY OF SURGERY  Water Non-Citrus Juices (without pulp, NO RED-Apple, White grape, White cranberry) Black Coffee (NO MILK/CREAM OR CREAMERS, sugar ok)  Clear Tea (NO MILK/CREAM OR CREAMERS, sugar ok) regular and decaf                             Plain Jell-O (NO RED)                                           Fruit ices (not with fruit pulp, NO RED)                                     Popsicles (NO RED)                                                               Sports drinks like Gatorade (NO RED)    The day of surgery:  Drink ONE (1) Pre-Surgery Clear Ensure at 4:30 AM the morning of surgery. Drink in one sitting. Do not sip.  This drink was given to you during your hospital  pre-op appointment visit. Nothing else to drink after completing the  Pre-Surgery Clear Ensure.          If  you have questions, please contact your surgeon's office.   FOLLOW BOWEL PREP AND ANY ADDITIONAL PRE OP INSTRUCTIONS YOU RECEIVED FROM YOUR SURGEON'S OFFICE!!!     Oral Hygiene is also important to reduce your risk of infection.                                    Remember - BRUSH YOUR TEETH THE MORNING OF SURGERY WITH YOUR REGULAR TOOTHPASTE  DENTURES WILL BE REMOVED PRIOR TO SURGERY PLEASE DO NOT APPLY "Poly grip" OR ADHESIVES!!!  Take these medicines the morning of surgery with A SIP OF WATER: Norco, Sertraline, Tamsulosin              You may not have any metal on your body including jewelry, and body piercing             Do not wear lotions, powders, cologne, or deodorant              Men may shave face and neck.   Do not bring valuables to the hospital. [place].   Contacts, glasses, dentures or bridgework may not be worn into surgery.   Bring small overnight bag day of surgery.   DO NOT [place]. PHARMACY WILL DISPENSE MEDICATIONS LISTED ON YOUR MEDICATION LIST TO YOU DURING YOUR ADMISSION [place]!   Special Instructions: Bring a copy of your healthcare power of attorney and living will documents the day of surgery if you haven't scanned them before.              Please read over the following fact sheets you were given: IF [place] [phone] Schneiders    If you received a COVID test during your pre-op visit  it is requested that you wear a mask when out in public, stay away from anyone that may not be feeling well and notify your surgeon if you develop symptoms. If you test positive for Covid or have been in contact with anyone that has tested positive in the last 10 days please notify you surgeon.    [place] - Preparing for Surgery Before surgery, you can play an important role.  Because skin is not sterile, your skin  needs to be as free of germs as possible.  You can reduce the number of germs on your skin by washing with CHG (chlorahexidine gluconate) soap before surgery.  CHG is an antiseptic cleaner which kills germs and bonds with the skin to continue killing germs even after washing. Please DO NOT use if you have an allergy to CHG or antibacterial soaps.  If your skin becomes reddened/irritated stop using the CHG and inform your nurse when you arrive at Short Stay. Do not shave (including legs and underarms) for at least 48 hours prior to the first CHG shower.  You may shave your face/neck.  Please follow these instructions carefully:  1.  Shower with CHG Soap the night before surgery and the  morning of surgery.  2.  If you choose to wash your hair, wash your hair first as usual with your normal  shampoo.  3.  After you shampoo, rinse your hair and body thoroughly to remove the shampoo.                             4.  Use CHG as you would any other liquid soap.  You can apply chg directly to the skin and wash.  Gently with a scrungie or clean washcloth.  5.  Apply the CHG Soap to your body ONLY FROM THE NECK DOWN.   Do   not use on face/ open                           Wound or open sores.  Avoid contact with eyes, ears mouth and   genitals (private parts).                       Wash face,  Genitals (private parts) with your normal soap.             6.  Wash thoroughly, paying special attention to the area where your    surgery  will be performed.  7.  Thoroughly rinse your body with warm water from the neck down.  8.  DO NOT shower/wash with your normal soap after using and rinsing off the CHG Soap.                9.  Pat yourself dry with a clean towel.            10.  Wear clean pajamas.            11.  Place clean sheets on your bed the night of your first shower and do not  sleep with pets. Day of Surgery : Do not apply any lotions/deodorants the morning of surgery.  Please wear clean clothes to the  hospital/surgery center.  FAILURE TO FOLLOW THESE INSTRUCTIONS MAY RESULT IN THE CANCELLATION OF YOUR SURGERY  PATIENT SIGNATURE_________________________________  NURSE SIGNATURE__________________________________  ________________________________________________________________________  Adam Phenix  An incentive spirometer is a tool that can help keep your lungs clear and active. This tool measures how well you are filling your lungs with each breath. Taking long deep breaths may help reverse or decrease the chance of developing breathing (pulmonary) problems (especially infection) following: A long period of time when you are unable to move or be active. BEFORE THE PROCEDURE  If the spirometer includes an indicator to show your best effort, your nurse or respiratory therapist will set it to a desired goal. If possible, sit up straight or lean slightly forward. Try not to slouch. Hold the incentive spirometer in an upright position. INSTRUCTIONS FOR USE  Sit on the edge of your bed if possible, or sit up as far as you can in bed or on a chair. Hold the incentive spirometer in an upright position. Breathe out normally. Place the mouthpiece in your mouth and seal your lips tightly around it. Breathe in slowly and as deeply as possible, raising the piston or the ball toward the top of the column. Hold your breath for 3-5 seconds or for as long as possible. Allow the piston or ball to fall to the bottom of the column. Remove the mouthpiece from your mouth and breathe out normally. Rest for a few seconds and repeat Steps 1 through 7 at least 10 times every 1-2 hours when you are awake. Take your time and take a few normal breaths between deep breaths. The spirometer may include an indicator to show your best effort. Use the indicator as a goal to work toward during each repetition. After each set of 10 deep breaths, practice coughing to be sure your lungs are clear. If you have an  incision (the cut made at the time of surgery), support your incision when coughing by placing a pillow or rolled up towels firmly against it. Once you are able to get out of bed, walk around indoors and cough well. You may stop using the incentive spirometer when instructed by your caregiver.  RISKS AND COMPLICATIONS Take your time so you do not get dizzy or light-headed. If you are in pain, you may need to take or  ask for pain medication before doing incentive spirometry. It is harder to take a deep breath if you are having pain. AFTER USE Rest and breathe slowly and easily. It can be helpful to keep track of a log of your progress. Your caregiver can provide you with a simple table to help with this. If you are using the spirometer at home, follow these instructions: [place] IF:  You are having difficultly using the spirometer. You have trouble using the spirometer as often as instructed. Your pain medication is not giving enough relief while using the spirometer. You develop fever of 100.5 F (38.1 C) or higher. SEEK IMMEDIATE MEDICAL CARE IF:  You cough up bloody sputum that had not been present before. You develop fever of 102 F (38.9 C) or greater. You develop worsening pain at or near the incision site. MAKE SURE YOU:  Understand these instructions. Will watch your condition. Will get help right away if you are not doing well or get worse. Document Released: 01/08/2007 Document Revised: 11/20/2011 Document Reviewed: 03/11/2007 ExitCare Patient Information 2014 [place].   ________________________________________________________________________  [facility]- Preparing for Total Shoulder Arthroplasty    Before surgery, you can play an important role. Because skin is not sterile, your skin needs to be as free of germs as possible. You can reduce the number of germs on your skin by using the following products. Benzoyl Peroxide Gel Reduces the number of germs  present on the skin Applied twice a day to shoulder area starting two days before surgery    ==================================================================  Please follow these instructions carefully:  BENZOYL PEROXIDE 5% GEL  Please do not use if you have an allergy to benzoyl peroxide.   If your skin becomes reddened/irritated stop using the benzoyl peroxide.  Starting two days before surgery, apply as follows: Apply benzoyl peroxide in the morning and at night. Apply after taking a shower. If you are not taking a shower clean entire shoulder front, back, and side along with the armpit with a clean wet washcloth.  Place a quarter-sized dollop on your shoulder and rub in thoroughly, making sure to cover the front, back, and side of your shoulder, along with the armpit.   2 days before ____ AM   ____ PM              1 day before ____ AM   ____ PM                         Do this twice a day for two days.  (Last application is the night before surgery, AFTER using the CHG soap as described below).  Do NOT apply benzoyl peroxide gel on the day of surgery.

## 2022-09-07 NOTE — Progress Notes (Addendum)
COVID Vaccine Completed: yes  Date of COVID positive in last 90 days: no  PCP - Gaynelle Arabian, MD Cardiologist - Lyman Bishop, MD  Cardiac clearance by Laurann Montana 08/28/22 in Epic  Chest x-ray - CT 04/18/22 Epic EKG - 08/10/22 Epic Stress Test - 02/20/20 Epic ECHO - scheduled 09/21/22 Cardiac Cath - 08/09/22 Epic Pacemaker/ICD device last checked:n/a Spinal Cord Stimulator: n/a  Bowel Prep - no  Sleep Study - yes, negative  CPAP -   Fasting Blood Sugar - pre DM Checks Blood Sugar no checks at home   Last dose of GLP1 agonist-  N/A GLP1 instructions:  N/A   Last dose of SGLT-2 inhibitors-  N/A SGLT-2 instructions: N/A   Blood Thinner Instructions: Aspirin Instructions: ASA 81, no instructions per pt, will call cards Last Dose:  Activity level: Can go up a flight of stairs and perform activities of daily living without stopping and without symptoms of chest pain or shortness of breath.    Anesthesia review: CAD, dizziness, SOB, stroke, hepatitis, malignant neoplasm of lung  Patient denies shortness of breath, fever, cough and chest pain at PAT appointment  Patient verbalized understanding of instructions that were given to them at the PAT appointment. Patient was also instructed that they will need to review over the PAT instructions again at home before surgery.

## 2022-09-08 ENCOUNTER — Encounter (HOSPITAL_COMMUNITY): Payer: Self-pay

## 2022-09-08 ENCOUNTER — Encounter (HOSPITAL_COMMUNITY)
Admission: RE | Admit: 2022-09-08 | Discharge: 2022-09-08 | Disposition: A | Discharge status: Home / Self Care | Payer: Medicare Other | Source: Ambulatory Visit | Attending: Orthopedic Surgery | Admitting: Orthopedic Surgery

## 2022-09-08 VITALS — BP 117/59 | HR 85 | Resp 12 | Ht 73.0 in | Wt 195.6 lb

## 2022-09-08 DIAGNOSIS — K759 Inflammatory liver disease, unspecified: Secondary | ICD-10-CM | POA: Diagnosis not present

## 2022-09-08 DIAGNOSIS — R7303 Prediabetes: Secondary | ICD-10-CM | POA: Insufficient documentation

## 2022-09-08 DIAGNOSIS — Z01818 Encounter for other preprocedural examination: Secondary | ICD-10-CM

## 2022-09-08 DIAGNOSIS — Z01812 Encounter for preprocedural laboratory examination: Secondary | ICD-10-CM | POA: Diagnosis present

## 2022-09-08 HISTORY — DX: Depression, unspecified: F32.A

## 2022-09-08 HISTORY — DX: Atherosclerotic heart disease of native coronary artery without angina pectoris: I25.10

## 2022-09-08 HISTORY — DX: Prediabetes: R73.03

## 2022-09-08 LAB — COMPREHENSIVE METABOLIC PANEL
ALT: 31 U/L (ref 0–44)
AST: 31 U/L (ref 15–41)
Albumin: 3.7 g/dL (ref 3.5–5.0)
Alkaline Phosphatase: 58 U/L (ref 38–126)
Anion gap: 6 (ref 5–15)
BUN: 21 mg/dL (ref 8–23)
CO2: 26 mmol/L (ref 22–32)
Calcium: 9.1 mg/dL (ref 8.9–10.3)
Chloride: 105 mmol/L (ref 98–111)
Creatinine, Ser: 0.99 mg/dL (ref 0.61–1.24)
GFR, Estimated: >60 mL/min (ref >60)
Glucose, Bld: 130 mg/dL — ABNORMAL HIGH (ref 70–99)
Potassium: 4 mmol/L (ref 3.5–5.1)
Sodium: 137 mmol/L (ref 135–145)
Total Bilirubin: 0.6 mg/dL (ref 0.3–1.2)
Total Protein: 7 g/dL (ref 6.5–8.1)

## 2022-09-08 LAB — CBC
HCT: 45.5 % (ref 39.0–52.0)
Hemoglobin: 14.9 g/dL (ref 13.0–17.0)
MCH: 29.4 pg (ref 26.0–34.0)
MCHC: 32.7 g/dL (ref 30.0–36.0)
MCV: 89.9 fL (ref 80.0–100.0)
Platelets: 169 10*3/uL (ref 150–400)
RBC: 5.06 MIL/uL (ref 4.22–5.81)
RDW: 13.9 % (ref 11.5–15.5)
WBC: 10.5 10*3/uL (ref 4.0–10.5)
nRBC: 0 % (ref 0.0–0.2)

## 2022-09-08 LAB — SURGICAL PCR SCREEN
MRSA, PCR: NEGATIVE
Staphylococcus aureus: NEGATIVE

## 2022-09-09 LAB — HEMOGLOBIN A1C
Hgb A1c MFr Bld: 5.8 % — ABNORMAL HIGH (ref 4.8–5.6)
Mean Plasma Glucose: 120 mg/dL

## 2022-09-15 ENCOUNTER — Encounter (HOSPITAL_COMMUNITY): Payer: Self-pay | Admitting: Physician Assistant

## 2022-09-15 NOTE — Progress Notes (Signed)
Anesthesia Chart Review   Case: [id number] Date/Time: 09/22/22 0715   Procedure: REVERSE SHOULDER ARTHROPLASTY (Left: Shoulder)   Anesthesia type: Choice   Pre-op diagnosis: left shoulder rotator cuff tear   Location: [facility] ROOM 06 / [facility]   Surgeons: Netta Cedars, MD       DISCUSSION:71 y.o. former smoker with h/o CAD, stroke, Stage IIIA (T4, N0, M0) non-small cell lung cancer, left shoulder rotator cuff tear scheduled for above procedure 09/22/2022 with Dr. Netta Cedars.    1) Status post right lower lobectomy with lymph node dissection under the care of Dr. Kipp Brood on 04/28/2020. 2)  Adjuvant systemic chemotherapy with cisplatin 75 mg/m2 and Alimta 500 mg/mm IV every 3 weeks.  First dose expected on 06/16/2020.  Status post 4 cycles.  Last seen by oncology 04/19/2022. Repeat CT with no concerning findings. Stable at this visit with 6 month follow up.   Pt last seen by cardiology 08/28/2022. Per OV note, "Preop - Upcoming reverse shoulder arthroplasty. 08/09/22 LHC with stable burden of disease and known CTO with collaterals recommended for medical management. Per AHA/ACC guidelines, he is deemed acceptable risk for the planned procedure without additional cardiovascular testing. "  Anticipate pt can proceed with planned procedure barring acute status change.   VS: BP (!) 117/59   Pulse 85   Resp 12   Ht 6\' 1"  (1.854 m)   Wt 88.7 kg   SpO2 98%   BMI 25.81 kg/m   PROVIDERS:  Gaynelle Arabian, MD is PCP   Cardiologist - Lyman Bishop, MD  LABS: Labs reviewed: Acceptable for surgery. (all labs ordered are listed, but only abnormal results are displayed)  Labs Reviewed  COMPREHENSIVE METABOLIC PANEL - Abnormal; Notable for the following components:      Result Value   Glucose, Bld 130 (*)    All other components within normal limits  HEMOGLOBIN A1C - Abnormal; Notable for the following components:   Hgb A1c MFr Bld 5.8 (*)    All other components within normal limits  SURGICAL  PCR SCREEN  CBC     IMAGES:   EKG:   CV: Cardiac Cath 08/09/2022   Ost LAD lesion is 15% stenosed.   Mid LAD lesion is 20% stenosed.   Dist Cx lesion is 90% stenosed.   Prox RCA lesion is 100% stenosed.   1st Mrg lesion is 40% stenosed.   1.  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   2.  RFR assessment of the first obtuse marginal was 0.92; medical management should be pursued. 3.  LVEDP of 10 mmHg.   Recommendation: Medical management. Past Medical History:  Diagnosis Date   Arthritis    Carpal tunnel syndrome    Right   Cataracts, bilateral    very mild   Coronary artery disease    Depression    Early cataracts, bilateral    Fatty liver    Gallstones    GERD (gastroesophageal reflux disease)    Headache 2011   HX OF VISUAL MIGRAINES - NO PAIN --MIGRAINES ATTRIBUTED TO PT CUTTING NICOTINE PATCH IN HALF BEFORE APPLYING TO HIS SKIN.     Hepatitis 2001   C -treated and no problems since  2001 'cured'   History of hiatal hernia    History of kidney stones    found on CT Scan  HOH (hard of hearing)    lung ca dx'd 12/06/19   RIGHT LUNG   Muscle cramps    hands and legs   Osteopenia    Pain    KNEE OA   Pre-diabetes    Right lower lobe lung mass    Shortness of breath dyspnea    "SOMETIMES FEELS LIKE I CAN'T CATCH A DEEP BREATH "- ON GOING FOR YEARS - STATES MEDICAL DOCTOR SENT HIM FOR CARDIAC WORK UP 2011 - [place]   Stroke ([place])    HX POSSIBLE TIA - RT LEG NUMBNESS AND TINGLING FINGERS AND NUMBNESS RT FACE - ALL RESOLVED ; STATES NEUROLOGIST COULD NOT FIND ANY EVIDENCE OF STROKE - THOUGHT PT'S EPISODE MIGHT BE FROM CUTTING NICOTINE PATCH IN HALF AND APPLYING TO SKIN   Trigger finger    LEFT MIDDLE FINGER   Wears glasses     Past Surgical History:   Procedure Laterality Date   BRONCHIAL BIOPSY  02/24/2020   Procedure: BRONCHIAL BIOPSIES;  Surgeon: Collene Gobble, MD;  Location: [facility];  Service: Pulmonary;;   BRONCHIAL BRUSHINGS  02/24/2020   Procedure: BRONCHIAL BRUSHINGS;  Surgeon: Collene Gobble, MD;  Location: [facility] ENDOSCOPY;  Service: Pulmonary;;   BRONCHIAL NEEDLE ASPIRATION BIOPSY  02/24/2020   Procedure: BRONCHIAL NEEDLE ASPIRATION BIOPSIES;  Surgeon: Collene Gobble, MD;  Location: [facility] ENDOSCOPY;  Service: Pulmonary;;   BRONCHIAL WASHINGS  02/24/2020   Procedure: BRONCHIAL WASHINGS;  Surgeon: Collene Gobble, MD;  Location: [place] ENDOSCOPY;  Service: Pulmonary;;   COLONOSCOPY     INTERCOSTAL NERVE BLOCK Right 04/28/2020   Procedure: INTERCOSTAL NERVE BLOCK;  Surgeon: Lajuana Matte, MD;  Location: [place];  Service: Thoracic;  Laterality: Right;   INTRAVASCULAR PRESSURE WIRE/FFR STUDY N/A 08/09/2022   Procedure: INTRAVASCULAR PRESSURE WIRE/FFR STUDY;  Surgeon: Early Osmond, MD;  Location: [place] CV LAB;  Service: Cardiovascular;  Laterality: N/A;   left ankle surgery     fractured   LEFT HEART CATH AND CORONARY ANGIOGRAPHY N/A 06/29/2021   Procedure: LEFT HEART CATH AND CORONARY ANGIOGRAPHY;  Surgeon: Early Osmond, MD;  Location: [place] CV LAB;  Service: Cardiovascular;  Laterality: N/A;   LEFT HEART CATH AND CORONARY ANGIOGRAPHY N/A 08/09/2022   Procedure: LEFT HEART CATH AND CORONARY ANGIOGRAPHY;  Surgeon: Early Osmond, MD;  Location: [place] CV LAB;  Service: Cardiovascular;  Laterality: N/A;   left knee surgery     MENISCAL REMOVED   left shoulder     torn ligaments left shoulder   LUMBAR FUSION     L3 L4 L5   NODE DISSECTION Right 04/28/2020   Procedure: NODE DISSECTION;  Surgeon: Lajuana Matte, MD;  Location: [place];  Service: Thoracic;  Laterality: Right;   RIGHT CARPAL TUNNEL RELEASE  2011   RIGHT KNEE ARTHROSCOPY   AUG OR SEPT 2015   AT [place] Right 04/28/2020   XI ROBOTIC ASSISTED THORASCOPY-LOBECTOMY (Right)   TONSILLECTOMY     as child   TOTAL KNEE ARTHROPLASTY Left 07/14/2013   Procedure: LEFT TOTAL KNEE ARTHROPLASTY;  Surgeon: Mauri Pole, MD;  [facility];  Service: Orthopedics;  Laterality: Left;   TOTAL KNEE ARTHROPLASTY Right 07/21/2014   Procedure: RIGHT TOTAL KNEE ARTHROPLASTY;  Surgeon: Mauri Pole, MD;  [facility];  Service: Orthopedics;  Laterality: Right;   TOTAL SHOULDER ARTHROPLASTY Right 10/17/2019   Procedure: RIGHT  REVERSE TOTAL SHOULDER REPLACEMENT;  Surgeon: Netta Cedars, MD;  [facility];  Service: Orthopedics;  Laterality: Right;  interscalene block   TRIGGER FINGER RELEASE Left    VIDEO BRONCHOSCOPY WITH ENDOBRONCHIAL NAVIGATION Right 02/24/2020   Procedure: VIDEO BRONCHOSCOPY WITH ENDOBRONCHIAL NAVIGATION;  Surgeon: Collene Gobble, MD;  Location: [facility] ENDOSCOPY;  Service: Pulmonary;  Laterality: Right;   WEDGE RESECTION Right 04/28/2020   XI ROBOTIC ASSISTED THORASCOPY-RIGHT LOWER LOBE WEDGE RESECTION (Right Chest)     MEDICATIONS:  Alirocumab (PRALUENT) 150 MG/ML SOAJ   aspirin EC 81 MG tablet   clindamycin (CLEOCIN) 150 MG capsule   cyclobenzaprine (FLEXERIL) 10 MG tablet   donepezil (ARICEPT) 10 MG tablet   HYDROcodone-acetaminophen (NORCO/VICODIN) 5-325 MG tablet   ibuprofen (ADVIL) 200 MG tablet   Magnesium 500 MG TABS   sertraline (ZOLOFT) 100 MG tablet   tamsulosin (FLOMAX) 0.4 MG CAPS capsule   No current facility-administered medications for this encounter.    Konrad Felix Ward, PA-C [facility]-Surgical [facility] [phone]

## 2022-09-15 NOTE — Anesthesia Preprocedure Evaluation (Signed)
Anesthesia Evaluation    Airway        Dental   Pulmonary former smoker          Cardiovascular      Neuro/Psych    GI/Hepatic   Endo/Other    Renal/GU      Musculoskeletal   Abdominal   Peds  Hematology   Anesthesia Other Findings   Reproductive/Obstetrics                             Anesthesia Physical Anesthesia Plan  ASA:   Anesthesia Plan:    Post-op Pain Management:    Induction:   PONV Risk Score and Plan:   Airway Management Planned:   Additional Equipment:   Intra-op Plan:   Post-operative Plan:   Informed Consent:   Plan Discussed with:   Anesthesia Plan Comments: (See PAT note 09/08/2022)       Anesthesia Quick Evaluation

## 2022-09-21 ENCOUNTER — Ambulatory Visit (INDEPENDENT_AMBULATORY_CARE_PROVIDER_SITE_OTHER): Payer: Medicare Other

## 2022-09-21 ENCOUNTER — Telehealth (HOSPITAL_BASED_OUTPATIENT_CLINIC_OR_DEPARTMENT_OTHER): Payer: Self-pay

## 2022-09-21 DIAGNOSIS — R5383 Other fatigue: Secondary | ICD-10-CM | POA: Diagnosis not present

## 2022-09-21 DIAGNOSIS — R42 Dizziness and giddiness: Secondary | ICD-10-CM

## 2022-09-21 DIAGNOSIS — I25118 Atherosclerotic heart disease of native coronary artery with other forms of angina pectoris: Secondary | ICD-10-CM | POA: Diagnosis not present

## 2022-09-21 LAB — ECHOCARDIOGRAM COMPLETE
AR max vel: 1.84 cm2
AV Area VTI: 1.79 cm2
AV Area mean vel: 1.7 cm2
AV Mean grad: 3 mmHg
AV Peak grad: 5.3 mmHg
Ao pk vel: 1.15 m/s
Area-P 1/2: 3.6 cm2
Calc EF: 41.2 %
MV M vel: 4.05 m/s
MV Peak grad: 65.6 mmHg
S' Lateral: 3.56 cm
Single Plane A2C EF: 29.9 %
Single Plane A4C EF: 53.1 %

## 2022-09-21 NOTE — Telephone Encounter (Addendum)
Called patient to provide clearance for patient shoulder surgery. Patient has had a change of plans and is now undergoing emergency back surgery with Dr. Ellene Route tomorrow. Will route as FYI to Laurann Montana, NP, Dr. Debara Pickett and Dr. Ellene Route Patient requests his recent office visit and test results be faxed over to Dr. Ellene Route. Scheduled patient for 2/12 as in person with Laurann Montana, Np. Advised patient if not recovered enough to come into the office to let us know and we can consider switching to virtual.   ----- Message from Loel Dubonnet, NP sent at 09/21/2022  3:53 PM EST ----- Echocardiogram shows reduced heart pumping function.  This may be the cause of his dyspnea.  No significant valvular abnormalities.  Recommend office visit to optimize medical therapies for heart failure. Schedule in the next 2-3 weeks (okay to give him a bit of time to recover from shoulder surgery). Recommend supporting heart muscle by following a low salt diet and restricting to less than 2L (64oz) of fluid intake per day.   Per Dr. Debara Pickett He may proceed with shoulder surgery tomorrow as scheduled-Dr. Veverly Fells has been made aware.   [name] [name] elsnor

## 2022-09-22 ENCOUNTER — Ambulatory Visit (HOSPITAL_COMMUNITY): Admission: RE | Admit: 2022-09-22 | Payer: Medicare Other | Source: Ambulatory Visit | Admitting: Orthopedic Surgery

## 2022-09-22 ENCOUNTER — Encounter (HOSPITAL_COMMUNITY): Admission: RE | Payer: Self-pay | Source: Ambulatory Visit

## 2022-09-22 ENCOUNTER — Other Ambulatory Visit: Payer: Self-pay | Admitting: Neurological Surgery

## 2022-09-22 SURGERY — ARTHROPLASTY, SHOULDER, TOTAL, REVERSE
Anesthesia: Choice | Site: Shoulder | Laterality: Left

## 2022-09-25 ENCOUNTER — Other Ambulatory Visit: Payer: Self-pay

## 2022-09-25 ENCOUNTER — Encounter (HOSPITAL_COMMUNITY): Payer: Self-pay | Admitting: Neurological Surgery

## 2022-09-25 ENCOUNTER — Telehealth (HOSPITAL_BASED_OUTPATIENT_CLINIC_OR_DEPARTMENT_OTHER): Payer: Self-pay

## 2022-09-25 DIAGNOSIS — I6522 Occlusion and stenosis of left carotid artery: Secondary | ICD-10-CM

## 2022-09-25 NOTE — Progress Notes (Signed)
Pt updated with surgery location, date, time, and arrival. Pt also instructed to follow previous instructions given.

## 2022-09-25 NOTE — Anesthesia Preprocedure Evaluation (Signed)
Anesthesia Evaluation  Patient identified by MRN, date of birth, ID band Patient awake    Reviewed: Allergy & Precautions, NPO status , Patient's Chart, lab work & pertinent test results  Airway Mallampati: II  TM Distance: >3 FB Neck ROM: Limited    Dental no notable dental hx.    Pulmonary former smoker   Pulmonary exam normal        Cardiovascular + CAD   Rhythm:Regular Rate:Normal  Echo 09/21/2022 IMPRESSIONS   1. Left ventricular ejection fraction, by estimation, is 35 to 40%. Left  ventricular ejection fraction by 3D volume is 39 %. The left ventricle has  moderately decreased function. The left ventricle demonstrates global  hypokinesis. There is mild left  ventricular hypertrophy. Left ventricular diastolic parameters are  consistent with Grade I diastolic dysfunction (impaired relaxation).   2. Right ventricular systolic function is normal. The right ventricular  size is normal. Tricuspid regurgitation signal is inadequate for assessing  PA pressure.   3. The mitral valve is abnormal. Trivial mitral valve regurgitation.   4. The aortic valve is tricuspid. Aortic valve regurgitation is not  visualized. Aortic valve sclerosis is present, with no evidence of aortic  valve stenosis.   5. The inferior vena cava is normal in size with greater than 50%  respiratory variability, suggesting right atrial pressure of 3 mmHg.  - Comparison echocardiogram 05/11/2011: LVEF 70.1%, trivial TR/TR, grade 1 diastolic dysfunction.    Cardiac Cath 08/09/2022   Ost LAD lesion is 15% stenosed.   Mid LAD lesion is 20% stenosed.   Dist Cx lesion is 90% stenosed.   Prox RCA lesion is 100% stenosed.   1st Mrg lesion is 40% stenosed.   1.  Relatively unchanged burden of disease with chronic total occlusion of right coronary artery collateralized by the left system and bridging collaterals and a subtotally occluded distal left circumflex  which subtends a very small area.   2.  RFR assessment of the first obtuse marginal was 0.92; medical management should be pursued. 3.  LVEDP of 10 mmHg.   Recommendation: Medical management.    Neuro/Psych  Headaches   Depression    CVA, No Residual Symptoms    GI/Hepatic hiatal hernia,GERD  ,,(+) Hepatitis -, C  Endo/Other  negative endocrine ROS    Renal/GU negative Renal ROS  negative genitourinary   Musculoskeletal  (+) Arthritis , Osteoarthritis,    Abdominal Normal abdominal exam  (+)   Peds  Hematology negative hematology ROS (+)   Anesthesia Other Findings   Reproductive/Obstetrics                              Anesthesia Physical Anesthesia Plan  ASA: 3  Anesthesia Plan: General   Post-op Pain Management: Celebrex PO (pre-op)* and Tylenol PO (pre-op)*   Induction: Intravenous  PONV Risk Score and Plan: 2 and Ondansetron, Dexamethasone and Treatment may vary due to age or medical condition  Airway Management Planned: Mask and Oral ETT  Additional Equipment: None  Intra-op Plan:   Post-operative Plan: Extubation in OR  Informed Consent: I have reviewed the patients History and Physical, chart, labs and discussed the procedure including the risks, benefits and alternatives for the proposed anesthesia with the patient or authorized representative who has indicated his/her understanding and acceptance.     Dental advisory given  Plan Discussed with: CRNA  Anesthesia Plan Comments: (PAT note written 09/25/2022 by Shonna Chock, PA-C.  )  Anesthesia Quick Evaluation  

## 2022-09-25 NOTE — Telephone Encounter (Addendum)
Seen by patient [name] [name] on 09/23/2022  6:59 PM; repeat testing ordered.    ----- Message from Alver Sorrow, NP sent at 09/23/2022  3:32 PM EST ----- Left carotid artery with 1-39% stenosis which is mild. Not significant enough to cause dizziness. Aspirin and Praluent will help to prevent progression. Repeat ultrasound in one year for monitoring.

## 2022-09-25 NOTE — Progress Notes (Signed)
Anesthesia APP Update:  Case: [id number] Date/Time: 09/26/22 1121   Procedure: C4-5 ACDF - 3C   Anesthesia type: General   Pre-op diagnosis: Cervical myelopathy   [facility]   Surgeons: Barnett Abu, MD       DISCUSSION: Patient is a 72 year old male scheduled for the above procedure. He was initially scheduled for reverse left shoulder arthroplasty on 09/22/22 with Beverely Low, MD but this was postponed due to cervical myelopathy and need for ACDF.  History includes former smoker (quit 04/30/12), CAD (100% CTO RCA w/ collaterals by left system, bridging collaterals subtotally occluded dLCx, medical management 08/09/22), stroke, Stage IIIA (T4, N0, M0) non-small cell lung cancer (s/p RL Lobectomy 04/28/20, chemotherapy), dyspnea, GERD, hiatal hernia, pre-diabetes, hepatitis C (treated), fatty liver.   He had previous cardiology input for his left shoulder surgery (see nesthesia APP note written by [name] Douglas, PA-C for Encounter date 09/08/22). He had a LHC on 08/09/22 that showed relatively unchanged burden of coronary disease with CTO RCA with collaterals by the left system and bridging collaterals and a subtotally occluded dLCx with medical therapy recommended. Since then he also had an echocardiogram on 09/21/22 that showed LVEF 35-40%, with global hypokinesis, grade 1 diastolic dysfunction, normal RVSF, trivial MR. This was reviewed by Gillian Shields, NP with cardiology. She recommended an office visit in 2-3 weeks (okay to allow him time to recovery from shoulder surgery) to further optimize his medical therapies. In the interim she advised low salt diet and 2L fluid restriction. She added, "Per Dr. Rennis Golden He may proceed with shoulder surgery tomorrow as scheduled-Dr. Ranell Patrick has been made aware." Since his surgery has now be changed to ACDF with neurosurgery, [name] [name] did contact Dr. Blanchie Dessert office to make him aware.   Anesthesia team to evaluate on the day of  surgery.   VS:  BP Readings from Last 3 Encounters:  09/08/22 (!) 117/59  08/28/22 132/84  08/09/22 104/68   Pulse Readings from Last 3 Encounters:  09/08/22 85  08/28/22 82  08/09/22 82    PROVIDERS: Blair Heys, MD is PCP  Zoila Shutter, MD is cardiologist Si Gaul, MD is HEM-ONC Brynda Greathouse, MD is CT surgeon   LABS: Most recent labs in [facility] include: Lab Results  Component Value Date   WBC 10.5 09/08/2022   HGB 14.9 09/08/2022   HCT 45.5 09/08/2022   PLT 169 09/08/2022   GLUCOSE 130 (H) 09/08/2022   CHOL 153 12/12/2021   TRIG 126 12/12/2021   HDL 53 12/12/2021   LDLCALC 78 12/12/2021   ALT 31 09/08/2022   AST 31 09/08/2022   NA 137 09/08/2022   K 4.0 09/08/2022   CL 105 09/08/2022   CREATININE 0.99 09/08/2022   BUN 21 09/08/2022   CO2 26 09/08/2022   TSH 0.784 12/19/2021   HGBA1C 5.8 (H) 09/08/2022    IMAGES: MRI C-spine 09/20/22 ([facility]opy/PACS): IMPRESSION: 1. Bulky chronic cervical spine degeneration, including widespread facet arthropathy. Suspect developing facet ankylosis since 2019 on the left at C3-C4. And increasing C4-C5 spondylolisthesis, severe disc and posterior element degeneration. 2. Subsequent moderate spinal stenosis AND cord compression at C4-C5. Difficult to exclude cord edema or myelomalacia at that level. 3. Only borderline to mild spinal stenosis otherwise, but diffuse bilateral moderate and severe cervical neural foraminal stenosis, progressed since 2019 at the C3 and C5 nerve levels.   CT Chest 04/17/22: IMPRESSION: 1. Unchanged postoperative findings status post right lower lobectomy. No evidence  of recurrent or metastatic disease in the chest. 2. Mild emphysema. 3. Coronary artery disease. 4. Somewhat coarse contour of the liver in the included upper abdomen, suggestive of cirrhosis. Correlate with biochemical findings. 5. Splenomegaly. 6. Cholelithiasis. Aortic Atherosclerosis (ICD10-I70.0) and  Emphysema (ICD10-J43.9).    EKG: 08/09/22: Sinus rhythm with occasional Premature ventricular complexes Left axis deviation Abnormal ECG When compared with ECG of 26-Apr-2020 09:46, PREVIOUS ECG IS PRESENT Premature ventricular complexes New since previous tracing Confirmed by Olga Millers ([id number]) on 08/09/2022 12:28:58 PM   CV: Echo 09/21/2022 IMPRESSIONS   1. Left ventricular ejection fraction, by estimation, is 35 to 40%. Left  ventricular ejection fraction by 3D volume is 39 %. The left ventricle has  moderately decreased function. The left ventricle demonstrates global  hypokinesis. There is mild left  ventricular hypertrophy. Left ventricular diastolic parameters are  consistent with Grade I diastolic dysfunction (impaired relaxation).   2. Right ventricular systolic function is normal. The right ventricular  size is normal. Tricuspid regurgitation signal is inadequate for assessing  PA pressure.   3. The mitral valve is abnormal. Trivial mitral valve regurgitation.   4. The aortic valve is tricuspid. Aortic valve regurgitation is not  visualized. Aortic valve sclerosis is present, with no evidence of aortic  valve stenosis.   5. The inferior vena cava is normal in size with greater than 50%  respiratory variability, suggesting right atrial pressure of 3 mmHg.  - Comparison echocardiogram 05/11/2011: LVEF 70.1%, trivial TR/TR, grade 1 diastolic dysfunction.    US Carotid 09/21/2022 Summary:  - Right Carotid: The extracranial vessels were near-normal with only minimal wall thickening or plaque.  - Left Carotid: Velocities in the left ICA are consistent with a 1-39% stenosis.  - Vertebrals: Bilateral vertebral arteries demonstrate antegrade flow.  - Subclavians: Normal flow hemodynamics were seen in bilateral subclavian arteries.    Cardiac Cath 08/09/2022   Ost LAD lesion is 15% stenosed.   Mid LAD lesion is 20% stenosed.   Dist Cx lesion is 90% stenosed.   Prox RCA  lesion is 100% stenosed.   1st Mrg lesion is 40% stenosed.   1.  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   2.  RFR assessment of the first obtuse marginal was 0.92; medical management should be pursued. 3.  LVEDP of 10 mmHg.   Recommendation: Medical management.   Past Medical History:  Diagnosis Date   Arthritis    Carpal tunnel syndrome    Right   Cataracts, bilateral    very mild   Coronary artery disease    Depression    Early cataracts, bilateral    Fatty liver    Gallstones    GERD (gastroesophageal reflux disease)    Headache 2011   HX OF VISUAL MIGRAINES - NO PAIN --MIGRAINES ATTRIBUTED TO PT CUTTING NICOTINE PATCH IN HALF BEFORE APPLYING TO HIS SKIN.     Hepatitis 2001   C -treated and no problems since  2001 'cured'   History of hiatal hernia    History of kidney stones    found on CT Scan   HOH (hard of hearing)    lung ca dx'd 12/06/19   RIGHT LUNG   Muscle cramps    hands and legs   Osteopenia    Pain    KNEE OA   Pre-diabetes  Right lower lobe lung mass    Shortness of breath dyspnea    "SOMETIMES FEELS LIKE I CAN'T CATCH A DEEP BREATH "- ON GOING FOR YEARS - STATES MEDICAL DOCTOR SENT HIM FOR CARDIAC WORK UP 2011 - PT STATES HEART OK   Stroke (HCC)    HX POSSIBLE TIA - RT LEG NUMBNESS AND TINGLING FINGERS AND NUMBNESS RT FACE - ALL RESOLVED ; STATES NEUROLOGIST COULD NOT FIND ANY EVIDENCE OF STROKE - THOUGHT PT'S EPISODE MIGHT BE FROM CUTTING NICOTINE PATCH IN HALF AND APPLYING TO SKIN   Trigger finger    LEFT MIDDLE FINGER   Wears glasses     Past Surgical History:  Procedure Laterality Date   BRONCHIAL BIOPSY  02/24/2020   Procedure: BRONCHIAL BIOPSIES;  Surgeon: Leslye Peer, MD;  Location: [facility] END[facility];  Service: Pulmonary;;   BRONCHIAL BRUSHINGS   02/24/2020   Procedure: BRONCHIAL BRUSHINGS;  Surgeon: Leslye Peer, MD;  Location: [facility] ENDOSCOPY;  Service: Pulmonary;;   BRONCHIAL NEEDLE ASPIRATION BIOPSY  02/24/2020   Procedure: BRONCHIAL NEEDLE ASPIRATION BIOPSIES;  Surgeon: Leslye Peer, MD;  Location: [facility] ENDOSCOPY;  Service: Pulmonary;;   BRONCHIAL WASHINGS  02/24/2020   Procedure: BRONCHIAL WASHINGS;  Surgeon: Leslye Peer, MD;  [facility];  Service: Pulmonary;;   COLONOSCOPY     INTERCOSTAL NERVE BLOCK Right 04/28/2020   Procedure: INTERCOSTAL NERVE BLOCK;  Surgeon: Corliss Skains, MD;  Location: [facility] OR;  Service: Thoracic;  Laterality: Right;   INTRAVASCULAR PRESSURE WIRE/FFR STUDY N/A 08/09/2022   Procedure: INTRAVASCULAR PRESSURE WIRE/FFR STUDY;  Surgeon: Orbie Pyo, MD;  [facility] IN[facility]AS[facility] CV LAB;  Service: Cardiovascular;  Laterality: N/A;   left ankle surgery     fractured   LEFT HEART CATH AND CORONARY ANGIOGRAPHY N/A 06/29/2021   Procedure: LEFT HEART CATH AND CORONARY ANGIOGRAPHY;  Surgeon: Orbie Pyo, MD;  [facility];  Service: Cardiovascular;  Laterality: N/A;   LEFT HEART CATH AND CORONARY ANGIOGRAPHY N/A 08/09/2022   Procedure: LEFT HEART CATH AND CORONARY ANGIOGRAPHY;  Surgeon: Orbie Pyo, MD;  [facility];  Service: Cardiovascular;  Laterality: N/A;   left knee surgery     MENISCAL REMOVED   left shoulder     torn ligaments left shoulder   LUMBAR FUSION     L3 L4 L5   NODE DISSECTION Right 04/28/2020   Procedure: NODE DISSECTION;  Surgeon: Corliss Skains, MD;  Location: [facility];  Service: Thoracic;  Laterality: Right;   RIGHT CARPAL TUNNEL RELEASE  2011   RIGHT KNEE ARTHROSCOPY   AUG OR SEPT 2015   AT [facility]   ROOT CANAL     THORACOSCOPY Right 04/28/2020   XI ROBOTIC ASSISTED THORASCOPY-LOBECTOMY (Right)   TONSILLECTOMY     as child   TOTAL KNEE ARTHROPLASTY Left 07/14/2013   Procedure: LEFT TOTAL KNEE  ARTHROPLASTY;  Surgeon: Shelda Pal, MD;  Location: [facility] ORS;  Service: Orthopedics;  Laterality: Left;   TOTAL KNEE ARTHROPLASTY Right 07/21/2014   Procedure: RIGHT TOTAL KNEE ARTHROPLASTY;  Surgeon: Shelda Pal, MD;  Location: [facility] ORS;  Service: Orthopedics;  Laterality: Right;   TOTAL SHOULDER ARTHROPLASTY Right 10/17/2019   Procedure: RIGHT REVERSE TOTAL SHOULDER REPLACEMENT;  Surgeon: Beverely Low, MD;  Location: [facility] ORS;  Service: Orthopedics;  Laterality: Right;  interscalene block   TRIGGER FINGER RELEASE Left    VIDEO BRONCHOSCOPY WITH ENDOBRONCHIAL NAVIGATION Right 02/24/2020   Procedure: VIDEO BRONCHOSCOPY  WITH ENDOBRONCHIAL NAVIGATION;  Surgeon: Leslye Peer, MD;  Location: [facility] ENDOSCOPY;  Service: Pulmonary;  Laterality: Right;   WEDGE RESECTION Right 04/28/2020   XI ROBOTIC ASSISTED THORASCOPY-RIGHT LOWER LOBE WEDGE RESECTION (Right Chest)     MEDICATIONS: No current facility-administered medications for this encounter.    Alirocumab (PRALUENT) 150 MG/ML SOAJ   aspirin EC 81 MG tablet   clindamycin (CLEOCIN) 150 MG capsule   cyclobenzaprine (FLEXERIL) 10 MG tablet   donepezil (ARICEPT) 10 MG tablet   HYDROcodone-acetaminophen (NORCO/VICODIN) 5-325 MG tablet   ibuprofen (ADVIL) 200 MG tablet   Magnesium 500 MG TABS   sertraline (ZOLOFT) 100 MG tablet   tamsulosin (FLOMAX) 0.4 MG CAPS capsule    Shonna Chock, PA-C Surgical Short Stay/Anesthesiology [facility] Phone [phone] [facility] Phone [phone] 09/25/2022 3:29 PM

## 2022-09-26 ENCOUNTER — Inpatient Hospital Stay (HOSPITAL_BASED_OUTPATIENT_CLINIC_OR_DEPARTMENT_OTHER): Payer: Medicare Other | Admitting: Vascular Surgery

## 2022-09-26 ENCOUNTER — Inpatient Hospital Stay (HOSPITAL_COMMUNITY): Payer: Medicare Other

## 2022-09-26 ENCOUNTER — Observation Stay (HOSPITAL_COMMUNITY)
Admission: RE | Admit: 2022-09-26 | Discharge: 2022-09-27 | Disposition: A | Discharge status: Home / Self Care | Payer: Medicare Other | Attending: Neurological Surgery | Admitting: Neurological Surgery

## 2022-09-26 ENCOUNTER — Inpatient Hospital Stay (HOSPITAL_COMMUNITY): Payer: Medicare Other | Admitting: Vascular Surgery

## 2022-09-26 ENCOUNTER — Other Ambulatory Visit: Payer: Self-pay

## 2022-09-26 ENCOUNTER — Encounter (HOSPITAL_COMMUNITY)
Admission: RE | Disposition: A | Discharge status: Home / Self Care | Payer: Self-pay | Source: Home / Self Care | Attending: Neurological Surgery

## 2022-09-26 ENCOUNTER — Encounter (HOSPITAL_COMMUNITY): Payer: Self-pay | Admitting: Neurological Surgery

## 2022-09-26 DIAGNOSIS — Z96653 Presence of artificial knee joint, bilateral: Secondary | ICD-10-CM | POA: Insufficient documentation

## 2022-09-26 DIAGNOSIS — I2510 Atherosclerotic heart disease of native coronary artery without angina pectoris: Secondary | ICD-10-CM

## 2022-09-26 DIAGNOSIS — Z87891 Personal history of nicotine dependence: Secondary | ICD-10-CM | POA: Diagnosis not present

## 2022-09-26 DIAGNOSIS — G959 Disease of spinal cord, unspecified: Principal | ICD-10-CM | POA: Diagnosis present

## 2022-09-26 DIAGNOSIS — M50021 Cervical disc disorder at C4-C5 level with myelopathy: Secondary | ICD-10-CM | POA: Diagnosis not present

## 2022-09-26 DIAGNOSIS — M1990 Unspecified osteoarthritis, unspecified site: Secondary | ICD-10-CM | POA: Diagnosis not present

## 2022-09-26 DIAGNOSIS — M50221 Other cervical disc displacement at C4-C5 level: Principal | ICD-10-CM | POA: Insufficient documentation

## 2022-09-26 DIAGNOSIS — Z96611 Presence of right artificial shoulder joint: Secondary | ICD-10-CM | POA: Insufficient documentation

## 2022-09-26 DIAGNOSIS — Z8673 Personal history of transient ischemic attack (TIA), and cerebral infarction without residual deficits: Secondary | ICD-10-CM | POA: Insufficient documentation

## 2022-09-26 DIAGNOSIS — M4712 Other spondylosis with myelopathy, cervical region: Secondary | ICD-10-CM | POA: Insufficient documentation

## 2022-09-26 HISTORY — PX: ANTERIOR CERVICAL DECOMP/DISCECTOMY FUSION: SHX1161

## 2022-09-26 LAB — TYPE AND SCREEN
ABO/RH(D): O NEG
Antibody Screen: NEGATIVE

## 2022-09-26 SURGERY — ANTERIOR CERVICAL DECOMPRESSION/DISCECTOMY FUSION 1 LEVEL
Anesthesia: General

## 2022-09-26 MED ORDER — BUPIVACAINE HCL (PF) 0.5 % IJ SOLN
INTRAMUSCULAR | Status: DC | PRN
  Administered 2022-09-26: 2.5 mL

## 2022-09-26 MED ORDER — LACTATED RINGERS IV SOLN: INTRAVENOUS | Status: DC

## 2022-09-26 MED ORDER — GLYCOPYRROLATE PF 0.2 MG/ML IJ SOSY
PREFILLED_SYRINGE | INTRAMUSCULAR | Status: DC | PRN
  Administered 2022-09-26: .2 mg via INTRAVENOUS

## 2022-09-26 MED ORDER — FLEET ENEMA 7-19 GM/118ML RE ENEM: 1.0000 | ENEMA | Freq: Once | RECTAL | Status: DC | PRN

## 2022-09-26 MED ORDER — ACETAMINOPHEN 500 MG PO TABS: 1000.0000 mg | ORAL_TABLET | Freq: Once | ORAL | Status: DC

## 2022-09-26 MED ORDER — MAGNESIUM OXIDE -MG SUPPLEMENT 400 (240 MG) MG PO TABS
400.0000 mg | ORAL_TABLET | Freq: Every morning | ORAL | Status: DC
  Administered 2022-09-27: 400 mg via ORAL
  Filled 2022-09-26: qty 1

## 2022-09-26 MED ORDER — CHLORHEXIDINE GLUCONATE 0.12 % MT SOLN
15.0000 mL | Freq: Once | OROMUCOSAL | Status: AC
  Administered 2022-09-26: 15 mL via OROMUCOSAL
  Filled 2022-09-26: qty 15

## 2022-09-26 MED ORDER — EPHEDRINE SULFATE-NACL 50-0.9 MG/10ML-% IV SOSY
PREFILLED_SYRINGE | INTRAVENOUS | Status: DC | PRN
  Administered 2022-09-26 (×2): 5 mg via INTRAVENOUS
  Administered 2022-09-26: 10 mg via INTRAVENOUS
  Administered 2022-09-26: 5 mg via INTRAVENOUS

## 2022-09-26 MED ORDER — LIDOCAINE 2% (20 MG/ML) 5 ML SYRINGE
INTRAMUSCULAR | Status: DC | PRN
  Administered 2022-09-26: 40 mg via INTRAVENOUS

## 2022-09-26 MED ORDER — ONDANSETRON HCL 4 MG/2ML IJ SOLN: 4.0000 mg | Freq: Four times a day (QID) | INTRAMUSCULAR | Status: DC | PRN

## 2022-09-26 MED ORDER — SERTRALINE HCL 100 MG PO TABS
100.0000 mg | ORAL_TABLET | Freq: Every morning | ORAL | Status: DC
  Administered 2022-09-27: 100 mg via ORAL
  Filled 2022-09-26: qty 1

## 2022-09-26 MED ORDER — DOCUSATE SODIUM 100 MG PO CAPS
100.0000 mg | ORAL_CAPSULE | Freq: Two times a day (BID) | ORAL | Status: DC
  Administered 2022-09-26 – 2022-09-27 (×2): 100 mg via ORAL
  Filled 2022-09-26 (×2): qty 1

## 2022-09-26 MED ORDER — SODIUM CHLORIDE 0.9 % IV SOLN
250.0000 mL | INTRAVENOUS | Status: DC
  Administered 2022-09-26: 250 mL via INTRAVENOUS

## 2022-09-26 MED ORDER — DEXAMETHASONE SODIUM PHOSPHATE 10 MG/ML IJ SOLN
INTRAMUSCULAR | Status: AC
  Filled 2022-09-26: qty 1

## 2022-09-26 MED ORDER — SUGAMMADEX SODIUM 200 MG/2ML IV SOLN
INTRAVENOUS | Status: DC | PRN
  Administered 2022-09-26: 200 mg via INTRAVENOUS

## 2022-09-26 MED ORDER — GLYCOPYRROLATE PF 0.2 MG/ML IJ SOSY
PREFILLED_SYRINGE | INTRAMUSCULAR | Status: AC
  Filled 2022-09-26: qty 1

## 2022-09-26 MED ORDER — PHENYLEPHRINE HCL-NACL 20-0.9 MG/250ML-% IV SOLN
INTRAVENOUS | Status: DC | PRN
  Administered 2022-09-26: 40 ug/min via INTRAVENOUS

## 2022-09-26 MED ORDER — PHENYLEPHRINE 80 MCG/ML (10ML) SYRINGE FOR IV PUSH (FOR BLOOD PRESSURE SUPPORT)
PREFILLED_SYRINGE | INTRAVENOUS | Status: AC
  Filled 2022-09-26: qty 10

## 2022-09-26 MED ORDER — OXYCODONE-ACETAMINOPHEN 5-325 MG PO TABS
1.0000 | ORAL_TABLET | ORAL | Status: DC | PRN
  Administered 2022-09-26 – 2022-09-27 (×3): 2 via ORAL
  Filled 2022-09-26 (×3): qty 2

## 2022-09-26 MED ORDER — BUPIVACAINE HCL (PF) 0.5 % IJ SOLN
INTRAMUSCULAR | Status: AC
  Filled 2022-09-26: qty 30

## 2022-09-26 MED ORDER — FENTANYL CITRATE (PF) 100 MCG/2ML IJ SOLN
INTRAMUSCULAR | Status: AC
  Filled 2022-09-26: qty 2

## 2022-09-26 MED ORDER — CHLORHEXIDINE GLUCONATE CLOTH 2 % EX PADS: 6.0000 | MEDICATED_PAD | Freq: Once | CUTANEOUS | Status: DC

## 2022-09-26 MED ORDER — FENTANYL CITRATE (PF) 250 MCG/5ML IJ SOLN
INTRAMUSCULAR | Status: DC | PRN
  Administered 2022-09-26: 100 ug via INTRAVENOUS
  Administered 2022-09-26 (×3): 50 ug via INTRAVENOUS

## 2022-09-26 MED ORDER — PROPOFOL 10 MG/ML IV BOLUS
INTRAVENOUS | Status: AC
  Filled 2022-09-26: qty 20

## 2022-09-26 MED ORDER — MENTHOL 3 MG MT LOZG: 1.0000 | LOZENGE | OROMUCOSAL | Status: DC | PRN

## 2022-09-26 MED ORDER — TAMSULOSIN HCL 0.4 MG PO CAPS
0.4000 mg | ORAL_CAPSULE | Freq: Every morning | ORAL | Status: DC
  Administered 2022-09-27: 0.4 mg via ORAL
  Filled 2022-09-26: qty 1

## 2022-09-26 MED ORDER — METHOCARBAMOL 1000 MG/10ML IJ SOLN: 500.0000 mg | Freq: Four times a day (QID) | INTRAVENOUS | Status: DC | PRN

## 2022-09-26 MED ORDER — FENTANYL CITRATE (PF) 250 MCG/5ML IJ SOLN
INTRAMUSCULAR | Status: AC
  Filled 2022-09-26: qty 5

## 2022-09-26 MED ORDER — EPHEDRINE 5 MG/ML INJ
INTRAVENOUS | Status: AC
  Filled 2022-09-26: qty 5

## 2022-09-26 MED ORDER — LIDOCAINE-EPINEPHRINE 1 %-1:100000 IJ SOLN
INTRAMUSCULAR | Status: AC
  Filled 2022-09-26: qty 1

## 2022-09-26 MED ORDER — LIDOCAINE 2% (20 MG/ML) 5 ML SYRINGE
INTRAMUSCULAR | Status: AC
  Filled 2022-09-26: qty 5

## 2022-09-26 MED ORDER — METHOCARBAMOL 500 MG PO TABS
500.0000 mg | ORAL_TABLET | Freq: Four times a day (QID) | ORAL | Status: DC | PRN
  Administered 2022-09-26 – 2022-09-27 (×2): 500 mg via ORAL
  Filled 2022-09-26 (×2): qty 1

## 2022-09-26 MED ORDER — MORPHINE SULFATE (PF) 2 MG/ML IV SOLN
2.0000 mg | INTRAVENOUS | Status: DC | PRN
  Administered 2022-09-26: 2 mg via INTRAVENOUS
  Filled 2022-09-26: qty 1

## 2022-09-26 MED ORDER — ROCURONIUM BROMIDE 10 MG/ML (PF) SYRINGE
PREFILLED_SYRINGE | INTRAVENOUS | Status: AC
  Filled 2022-09-26: qty 10

## 2022-09-26 MED ORDER — PHENOL 1.4 % MT LIQD: 1.0000 | OROMUCOSAL | Status: DC | PRN

## 2022-09-26 MED ORDER — BISACODYL 10 MG RE SUPP: 10.0000 mg | Freq: Every day | RECTAL | Status: DC | PRN

## 2022-09-26 MED ORDER — MIDAZOLAM HCL 2 MG/2ML IJ SOLN
INTRAMUSCULAR | Status: AC
  Filled 2022-09-26: qty 2

## 2022-09-26 MED ORDER — ACETAMINOPHEN 650 MG RE SUPP: 650.0000 mg | RECTAL | Status: DC | PRN

## 2022-09-26 MED ORDER — THROMBIN 5000 UNITS EX SOLR
CUTANEOUS | Status: AC
  Filled 2022-09-26: qty 5000

## 2022-09-26 MED ORDER — CEFAZOLIN SODIUM-DEXTROSE 2-4 GM/100ML-% IV SOLN
2.0000 g | INTRAVENOUS | Status: AC
  Administered 2022-09-26: 2 g via INTRAVENOUS
  Filled 2022-09-26: qty 100

## 2022-09-26 MED ORDER — PHENYLEPHRINE 80 MCG/ML (10ML) SYRINGE FOR IV PUSH (FOR BLOOD PRESSURE SUPPORT)
PREFILLED_SYRINGE | INTRAVENOUS | Status: DC | PRN
  Administered 2022-09-26 (×2): 80 ug via INTRAVENOUS
  Administered 2022-09-26: 160 ug via INTRAVENOUS
  Administered 2022-09-26 (×2): 80 ug via INTRAVENOUS

## 2022-09-26 MED ORDER — MIDAZOLAM HCL 2 MG/2ML IJ SOLN
INTRAMUSCULAR | Status: DC | PRN
  Administered 2022-09-26: 2 mg via INTRAVENOUS

## 2022-09-26 MED ORDER — SENNA 8.6 MG PO TABS
1.0000 | ORAL_TABLET | Freq: Two times a day (BID) | ORAL | Status: DC
  Administered 2022-09-26 – 2022-09-27 (×2): 8.6 mg via ORAL
  Filled 2022-09-26 (×2): qty 1

## 2022-09-26 MED ORDER — POLYETHYLENE GLYCOL 3350 17 G PO PACK: 17.0000 g | PACK | Freq: Every day | ORAL | Status: DC | PRN

## 2022-09-26 MED ORDER — HYDROCODONE-ACETAMINOPHEN 5-325 MG PO TABS
1.0000 | ORAL_TABLET | Freq: Four times a day (QID) | ORAL | Status: DC | PRN
  Administered 2022-09-26: 1.5 via ORAL
  Filled 2022-09-26: qty 2

## 2022-09-26 MED ORDER — THROMBIN 5000 UNITS EX SOLR
OROMUCOSAL | Status: DC | PRN
  Administered 2022-09-26: 5 mL via TOPICAL

## 2022-09-26 MED ORDER — ROCURONIUM BROMIDE 10 MG/ML (PF) SYRINGE
PREFILLED_SYRINGE | INTRAVENOUS | Status: DC | PRN
  Administered 2022-09-26: 20 mg via INTRAVENOUS
  Administered 2022-09-26: 10 mg via INTRAVENOUS
  Administered 2022-09-26: 50 mg via INTRAVENOUS

## 2022-09-26 MED ORDER — 0.9 % SODIUM CHLORIDE (POUR BTL) OPTIME
TOPICAL | Status: DC | PRN
  Administered 2022-09-26: 1000 mL

## 2022-09-26 MED ORDER — LIDOCAINE-EPINEPHRINE 1 %-1:100000 IJ SOLN
INTRAMUSCULAR | Status: DC | PRN
  Administered 2022-09-26: 2.5 mL

## 2022-09-26 MED ORDER — ONDANSETRON HCL 4 MG/2ML IJ SOLN
INTRAMUSCULAR | Status: DC | PRN
  Administered 2022-09-26: 4 mg via INTRAVENOUS

## 2022-09-26 MED ORDER — ACETAMINOPHEN 325 MG PO TABS: 650.0000 mg | ORAL_TABLET | ORAL | Status: DC | PRN

## 2022-09-26 MED ORDER — DONEPEZIL HCL 10 MG PO TABS
10.0000 mg | ORAL_TABLET | Freq: Every morning | ORAL | Status: DC
  Administered 2022-09-27: 10 mg via ORAL
  Filled 2022-09-26: qty 1

## 2022-09-26 MED ORDER — OXYCODONE HCL 5 MG PO TABS: 5.0000 mg | ORAL_TABLET | Freq: Once | ORAL | Status: DC | PRN

## 2022-09-26 MED ORDER — ORAL CARE MOUTH RINSE: 15.0000 mL | Freq: Once | OROMUCOSAL | Status: AC

## 2022-09-26 MED ORDER — SODIUM CHLORIDE 0.9% FLUSH: 3.0000 mL | INTRAVENOUS | Status: DC | PRN

## 2022-09-26 MED ORDER — ONDANSETRON HCL 4 MG/2ML IJ SOLN
INTRAMUSCULAR | Status: AC
  Filled 2022-09-26: qty 2

## 2022-09-26 MED ORDER — DEXAMETHASONE SODIUM PHOSPHATE 10 MG/ML IJ SOLN
INTRAMUSCULAR | Status: DC | PRN
  Administered 2022-09-26: 5 mg via INTRAVENOUS

## 2022-09-26 MED ORDER — PROPOFOL 10 MG/ML IV BOLUS
INTRAVENOUS | Status: DC | PRN
  Administered 2022-09-26: 25 ug/kg/min via INTRAVENOUS
  Administered 2022-09-26: 150 mg via INTRAVENOUS

## 2022-09-26 MED ORDER — ONDANSETRON HCL 4 MG PO TABS: 4.0000 mg | ORAL_TABLET | Freq: Four times a day (QID) | ORAL | Status: DC | PRN

## 2022-09-26 MED ORDER — CELECOXIB 200 MG PO CAPS: 200.0000 mg | ORAL_CAPSULE | Freq: Once | ORAL | Status: DC

## 2022-09-26 MED ORDER — FENTANYL CITRATE (PF) 100 MCG/2ML IJ SOLN
25.0000 ug | INTRAMUSCULAR | Status: DC | PRN
  Administered 2022-09-26 (×4): 25 ug via INTRAVENOUS

## 2022-09-26 MED ORDER — SODIUM CHLORIDE 0.9% FLUSH
3.0000 mL | Freq: Two times a day (BID) | INTRAVENOUS | Status: DC
  Administered 2022-09-26: 3 mL via INTRAVENOUS

## 2022-09-26 MED ORDER — OXYCODONE HCL 5 MG/5ML PO SOLN: 5.0000 mg | Freq: Once | ORAL | Status: DC | PRN

## 2022-09-26 SURGICAL SUPPLY — 49 items
ADH SKN CLS APL DERMABOND .7 (GAUZE/BANDAGES/DRESSINGS) ×1
BAG COUNTER SPONGE SURGICOUNT (BAG) ×1 IMPLANT
BAG SPNG CNTER NS LX DISP (BAG) ×2
BAND INSRT 18 STRL LF DISP RB (MISCELLANEOUS)
BAND RUBBER #18 3X1/16 STRL (MISCELLANEOUS) IMPLANT
BIT DRILL ACP 15 (DRILL) IMPLANT
BIT DRILL NEURO 2X3.1 SFT TUCH (MISCELLANEOUS) ×1 IMPLANT
BNDG GAUZE DERMACEA FLUFF 4 (GAUZE/BANDAGES/DRESSINGS) IMPLANT
BNDG GZE DERMACEA 4 6PLY (GAUZE/BANDAGES/DRESSINGS)
BUR BARREL STRAIGHT FLUTE 4.0 (BURR) IMPLANT
CAGE CERV MOD 7X17X14 7D (Cage) IMPLANT
CANISTER SUCT 3000ML PPV (MISCELLANEOUS) ×1 IMPLANT
DERMABOND ADVANCED .7 DNX12 (GAUZE/BANDAGES/DRESSINGS) ×1 IMPLANT
DRAPE LAPAROTOMY 100X72 PEDS (DRAPES) ×1 IMPLANT
DRAPE MICROSCOPE SLANT 54X150 (MISCELLANEOUS) IMPLANT
DRILL ACP 15 (DRILL) ×1
DRILL NEURO 2X3.1 SOFT TOUCH (MISCELLANEOUS) ×1
DURAPREP 6ML APPLICATOR 50/CS (WOUND CARE) ×1 IMPLANT
ELECT COATED BLADE 2.86 ST (ELECTRODE) ×1 IMPLANT
ELECT REM PT RETURN 9FT ADLT (ELECTROSURGICAL) ×1
ELECTRODE REM PT RTRN 9FT ADLT (ELECTROSURGICAL) ×1 IMPLANT
GAUZE 4X4 16PLY ~~LOC~~+RFID DBL (SPONGE) IMPLANT
GLOVE BIOGEL PI IND STRL 8.5 (GLOVE) ×1 IMPLANT
GLOVE ECLIPSE 8.5 STRL (GLOVE) ×1 IMPLANT
GOWN STRL REUS W/ TWL LRG LVL3 (GOWN DISPOSABLE) IMPLANT
GOWN STRL REUS W/ TWL XL LVL3 (GOWN DISPOSABLE) ×1 IMPLANT
GOWN STRL REUS W/TWL 2XL LVL3 (GOWN DISPOSABLE) ×1 IMPLANT
GOWN STRL REUS W/TWL LRG LVL3 (GOWN DISPOSABLE)
GOWN STRL REUS W/TWL XL LVL3 (GOWN DISPOSABLE) ×1
HALTER HD/CHIN CERV TRACTION D (MISCELLANEOUS) ×1 IMPLANT
HEMOSTAT POWDER KIT SURGIFOAM (HEMOSTASIS) ×1 IMPLANT
KIT BASIN OR (CUSTOM PROCEDURE TRAY) ×1 IMPLANT
NDL SPNL 22GX3.5 QUINCKE BK (NEEDLE) ×1 IMPLANT
NEEDLE HYPO 22GX1.5 SAFETY (NEEDLE) ×1 IMPLANT
NEEDLE SPNL 22GX3.5 QUINCKE BK (NEEDLE) ×1 IMPLANT
NS IRRIG 1000ML POUR BTL (IV SOLUTION) ×1 IMPLANT
PACK LAMINECTOMY NEURO (CUSTOM PROCEDURE TRAY) ×1 IMPLANT
PAD ARMBOARD 7.5X6 YLW CONV (MISCELLANEOUS) ×3 IMPLANT
PATTIES SURGICAL .5 X1 (DISPOSABLE) ×1 IMPLANT
PLATE ACP 1-LEVEL 1.6V20 (Plate) IMPLANT
PUTTY DBM PROPEL SM (Putty) IMPLANT
SCREW ACP VA ST 3.5X15 (Screw) IMPLANT
SET WALTER ACTIVATION W/DRAPE (SET/KITS/TRAYS/PACK) ×1 IMPLANT
SPIKE FLUID TRANSFER (MISCELLANEOUS) ×1 IMPLANT
SPONGE INTESTINAL PEANUT (DISPOSABLE) ×1 IMPLANT
SUT VIC AB 4-0 RB1 18 (SUTURE) ×2 IMPLANT
TOWEL GREEN STERILE (TOWEL DISPOSABLE) ×1 IMPLANT
TOWEL GREEN STERILE FF (TOWEL DISPOSABLE) ×1 IMPLANT
WATER STERILE IRR 1000ML POUR (IV SOLUTION) ×1 IMPLANT

## 2022-09-26 NOTE — Transfer of Care (Signed)
Immediate Anesthesia Transfer of Care Note  Patient: [name] [name]  Procedure(s) Performed: Cervical four-five Anterior Cervical Decompression Fusion  Patient Location: PACU  Anesthesia Type:General  Level of Consciousness: awake, alert , and oriented  Airway & Oxygen Therapy: Patient connected to face mask oxygen  Post-op Assessment: Report given to RN and Post -op Vital signs reviewed and stable  Post vital signs: Reviewed and stable  Last Vitals:  Vitals Value Taken Time  BP 133/58 09/26/22 1432  Temp    Pulse 71 09/26/22 1435  Resp 12 09/26/22 1435  SpO2 99 % 09/26/22 1435  Vitals shown include unvalidated device data.  Last Pain:  Vitals:   09/26/22 0939  PainSc: 3       Patients Stated Pain Goal: 3 (09/25/22 1427)  Complications: There were no known notable events for this encounter.

## 2022-09-26 NOTE — Anesthesia Procedure Notes (Signed)
Procedure Name: Intubation Date/Time: 09/26/2022 12:34 PM  Performed by: Sharyn Dross, CRNAPre-anesthesia Checklist: Patient identified, Emergency Drugs available, Suction available and Patient being monitored Patient Re-evaluated:Patient Re-evaluated prior to induction Oxygen Delivery Method: Circle system utilized Preoxygenation: Pre-oxygenation with 100% oxygen Induction Type: IV induction Ventilation: Mask ventilation without difficulty Laryngoscope Size: Glidescope and 4 Grade View: Grade I Tube type: Oral Tube size: 7.5 mm Number of attempts: 1 Airway Equipment and Method: Stylet and Oral airway Placement Confirmation: ETT inserted through vocal cords under direct vision, positive ETCO2 and breath sounds checked- equal and bilateral Secured at: 22 cm Tube secured with: Tape Dental Injury: Teeth and Oropharynx as per pre-operative assessment

## 2022-09-26 NOTE — Op Note (Signed)
Date of surgery: 09/26/2022 Preoperative diagnosis: Herniated nucleus pulposus C4-C5 with cervical myelopathy Postoperative diagnosis: Same Procedure: Anterior cervical decompression and C4-C5 arthrodesis with structural titanium spacer and morselized allograft and autograft Surgeon: Danielle Dess Anesthesia: General endotracheal Indications: [name] [name] is a 72 year old individual who has had progressive weakness and fatigue over the last several months time.  He has a history of significant lumbar spondylosis and he was evaluated recently and found to have progression of mild stenosis that he had at C4-5 noted back in 2019 to now severe stenosis secondary to herniated disc at that level in addition to some buckling of the posterior interspinous ligament.  He was advised regarding the need for surgical decompression and stabilization.  Procedure: Patient was brought to the operating room supine on the stretcher.  After the smooth induction of general tracheal anesthesia, he was placed in a horseshoe headrest with the neck slightly extended.  The front of the neck was then prepped with alcohol DuraPrep and draped in a sterile fashion.  A transverse incision was created in the neck and carried down through the platysma the plane between the sternocleidomastoid and strap muscles dissected bluntly into the prevertebral space was reached.  The first identifiable disc space was noted to be that of the C4 C5 interspace.  The dissection was then taken out under the longus coli muscle and a self-retaining Caspar type retractor was placed in the wound and connected to the [employer] arm.  The prevertebral dissection was then continued by removing some ventral osteophytes and entering the disc space removing a substantial quantity of severely degenerated desiccated disc material.  The region of the posterior longitudinal ligament was then opened and care was taken to identify the dura there was a substantial amount of disc  material herniated in the subligamentous space thus bowing of the posterior longitudinal ligament into the canal.  This was carefully dissected using a 1 and a 2 mm Kerrison punch.  Lateral gutters were then decompressed with significant osteophytes from the uncinate processes being drilled down and removed and used for bone graft.  Once the decompression was completed the interspace was sized for an appropriate sized spacer and was felt that a 7 x 17 x 14 mm spacer with 7 degrees lordosis would fit best into this interval.  This was packed with a combination of allograft and autograft.  He was placed into the interspace with a slight degree of flexion being applied to the neck.  This allowed closure of the interspace ventrally and good positioning of the graft.  Then the anterior plate was fixed to the ventral aspect of the vertebral bodies this was a 20 mm plate fixed with 4 locking variable angle for 3-1/2 x 15 mm screws.  Final radiograph was obtained identifying good alignment.  The soft tissues and prevertebral tissues were checked for hemostasis and when verified after irrigating the wound copiously the platysma was closed with 4-0 Vicryl in interrupted fashion and 4-0 Vicryl was used in the subcuticular tissue.  Dermabond was placed on the skin.  Blood loss was estimated at less than 50 cc.

## 2022-09-26 NOTE — Interval H&P Note (Signed)
History and Physical Interval Note:  09/26/2022 11:37 AM  [name] [name]  has presented today for surgery, with the diagnosis of Cervical myelopathy.  The various methods of treatment have been discussed with the patient and family. After consideration of risks, benefits and other options for treatment, the patient has consented to  Procedure(s) with comments: C4-5 ACDF (N/A) - 3C as a surgical intervention.  The patient's history has been reviewed, patient examined, no change in status, stable for surgery.  I have reviewed the patient's chart and labs.  Questions were answered to the patient's satisfaction.     Stefani Dama

## 2022-09-26 NOTE — H&P (Signed)
[name] [name] is an 72 y.o. male.   Chief Complaint: Generalized weakness gait difficulty balance difficulties HPI: The patient is a 72 year old male who has had significant lumbar spondylitic disease.  He has had previous lumbar decompression she tolerated well.  He notes that he has been having left shoulder pain and is to have some left shoulder reconstruction surgery.  He presented because he has been having difficulty with his gait weakness that has been generalized and was clinic quite concerned about this.  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  This process has been worked up about 5 years ago and it was noted that he had evidence of some mild spondylosis at C4-5.  This is worsened considerably.  Advised regarding the need for urgent surgery.  Past Medical History:  Diagnosis Date   Arthritis    Carpal tunnel syndrome    Right   Cataracts, bilateral    very mild   Coronary artery disease    Depression    Early cataracts, bilateral    Fatty liver    Gallstones    GERD (gastroesophageal reflux disease)    Headache 2011   HX OF VISUAL MIGRAINES - NO PAIN --MIGRAINES ATTRIBUTED TO PT CUTTING NICOTINE PATCH IN HALF BEFORE APPLYING TO HIS SKIN.     Hepatitis 2001   C -treated and no problems since  2001 'cured'   History of hiatal hernia    History of kidney stones    found on CT Scan   HOH (hard of hearing)    lung ca dx'd 12/06/19   RIGHT LUNG   Muscle cramps    hands and legs   Osteopenia    Pain    KNEE OA   Pre-diabetes    Right lower lobe lung mass    Shortness of breath dyspnea    "SOMETIMES FEELS LIKE I CAN'T CATCH A DEEP BREATH "- ON GOING FOR YEARS - STATES MEDICAL DOCTOR SENT HIM FOR CARDIAC WORK UP 2011 - [place]   Stroke ([place])    HX POSSIBLE TIA - RT LEG NUMBNESS AND TINGLING FINGERS AND NUMBNESS RT FACE - ALL RESOLVED ; STATES NEUROLOGIST COULD NOT FIND  ANY EVIDENCE OF STROKE - THOUGHT PT'S EPISODE MIGHT BE FROM CUTTING NICOTINE PATCH IN HALF AND APPLYING TO SKIN   Trigger finger    LEFT MIDDLE FINGER   Wears glasses     Past Surgical History:  Procedure Laterality Date   BRONCHIAL BIOPSY  02/24/2020   Procedure: BRONCHIAL BIOPSIES;  Surgeon: Collene Gobble, MD;  Location: [place] [facility];  Service: Pulmonary;;   BRONCHIAL BRUSHINGS  02/24/2020   Procedure: BRONCHIAL BRUSHINGS;  Surgeon: Collene Gobble, MD;  Location: [place];  Service: Pulmonary;;   BRONCHIAL NEEDLE ASPIRATION BIOPSY  02/24/2020   Procedure: BRONCHIAL NEEDLE ASPIRATION BIOPSIES;  Surgeon: Collene Gobble, MD;  Location: [place];  Service: Pulmonary;;   BRONCHIAL WASHINGS  02/24/2020   Procedure: BRONCHIAL WASHINGS;  Surgeon: Collene Gobble, MD;  Location: [place] [facility];  Service: Pulmonary;;   COLONOSCOPY     INTERCOSTAL NERVE BLOCK Right 04/28/2020   Procedure: INTERCOSTAL NERVE BLOCK;  Surgeon: Lajuana Matte, MD;  Location: [place];  Service: Thoracic;  Laterality: Right;   INTRAVASCULAR PRESSURE WIRE/FFR STUDY N/A 08/09/2022   Procedure: INTRAVASCULAR PRESSURE WIRE/FFR STUDY;  Surgeon: Early Osmond, MD;  Location: [place] [facility];  Service: Cardiovascular;  Laterality: N/A;   left ankle surgery     fractured   LEFT HEART CATH AND CORONARY ANGIOGRAPHY N/A 06/29/2021   Procedure: LEFT HEART CATH AND CORONARY ANGIOGRAPHY;  Surgeon: Orbie Pyo, MD;  Location: [facility];  Service: Cardiovascular;  Laterality: N/A;   LEFT HEART CATH AND CORONARY ANGIOGRAPHY N/A 08/09/2022   Procedure: LEFT HEART CATH AND CORONARY ANGIOGRAPHY;  Surgeon: Orbie Pyo, MD;  Location: [facility];  Service: Cardiovascular;  Laterality: N/A;   left knee surgery     MENISCAL REMOVED   left shoulder     torn ligaments left shoulder   LUMBAR FUSION     L3 L4 L5   NODE DISSECTION Right 04/28/2020   Procedure: NODE DISSECTION;  Surgeon: Corliss Skains, MD;  Location: [facility];  Service: Thoracic;  Laterality: Right;   RIGHT CARPAL TUNNEL RELEASE  2011   RIGHT KNEE ARTHROSCOPY   AUG OR SEPT 2015   AT [facility]   ROOT CANAL     THORACOSCOPY Right 04/28/2020   XI ROBOTIC ASSISTED THORASCOPY-LOBECTOMY (Right)   TONSILLECTOMY     as child   TOTAL KNEE ARTHROPLASTY Left 07/14/2013   Procedure: LEFT TOTAL KNEE ARTHROPLASTY;  Surgeon: Shelda Pal, MD;  Location: [facility];  Service: Orthopedics;  Laterality: Left;   TOTAL KNEE ARTHROPLASTY Right 07/21/2014   Procedure: RIGHT TOTAL KNEE ARTHROPLASTY;  Surgeon: Shelda Pal, MD;  Location: [facility];  Service: Orthopedics;  Laterality: Right;   TOTAL SHOULDER ARTHROPLASTY Right 10/17/2019   Procedure: RIGHT REVERSE TOTAL SHOULDER REPLACEMENT;  Surgeon: Beverely Low, MD;  Location: [facility];  Service: Orthopedics;  Laterality: Right;  interscalene block   TRIGGER FINGER RELEASE Left    VIDEO BRONCHOSCOPY WITH ENDOBRONCHIAL NAVIGATION Right 02/24/2020   Procedure: VIDEO BRONCHOSCOPY WITH ENDOBRONCHIAL NAVIGATION;  Surgeon: Leslye Peer, MD;  Location: [facility];  Service: Pulmonary;  Laterality: Right;   WEDGE RESECTION Right 04/28/2020   XI ROBOTIC ASSISTED THORASCOPY-RIGHT LOWER LOBE WEDGE RESECTION (Right Chest)     Family History  Problem Relation Age of Onset   Alcohol abuse Father    Hyperlipidemia Father    Heart attack Brother 24   Hyperlipidemia Brother    Hyperlipidemia Sister        3 sisters   Heart attack Brother    Hyperlipidemia Brother    Social History:  reports that he quit smoking about 10 years ago. His smoking use included cigarettes. He has never used smokeless tobacco. He reports that he does not currently use alcohol after a past usage of about 7.0 standard drinks of alcohol per week. He reports that he does not currently use drugs after having used the following drugs: Marijuana.  Allergies:  Allergies  Allergen Reactions   Statins Other  (See Comments)    MYALGIAS   Chantix [Varenicline] Other (See Comments)    Ineffective; patient did not like the way it made him feel   Tricor [Fenofibrate] Other (See Comments)    ineffective for cholesterol (2014)   Zetia [Ezetimibe] Other (See Comments)    ineffective for cholesterol    No medications prior to admission.    No results found for this or any previous visit (from the past 48 hour(s)). No results found.  Review of Systems  Constitutional:  Positive for activity change.  Musculoskeletal:  Positive for neck stiffness.  Neurological:  Positive for dizziness, weakness and numbness.  All other systems  reviewed and are negative.   There were no vitals taken for this visit. Physical Exam Constitutional:      Appearance: Normal appearance. He is normal weight.  HENT:     Head: Normocephalic and atraumatic.     Right Ear: Tympanic membrane, ear canal and external ear normal.     Left Ear: Tympanic membrane, ear canal and external ear normal.     Nose: Nose normal.     Mouth/Throat:     Mouth: Mucous membranes are moist.     Pharynx: Oropharynx is clear.  Eyes:     Extraocular Movements: Extraocular movements intact.     Conjunctiva/sclera: Conjunctivae normal.     Pupils: Pupils are equal, round, and reactive to light.  Neck:     Comments: Positive Spurling maneuver Cardiovascular:     Rate and Rhythm: Normal rate and regular rhythm.     Pulses: Normal pulses.     Heart sounds: Normal heart sounds.  Pulmonary:     Effort: Pulmonary effort is normal.     Breath sounds: Normal breath sounds.  Abdominal:     General: Abdomen is flat. Bowel sounds are normal.     Palpations: Abdomen is soft.  Musculoskeletal:        General: Normal range of motion.     Cervical back: Normal range of motion and neck supple.  Skin:    General: Skin is warm and dry.     Capillary Refill: Capillary refill takes less than 2 seconds.  Neurological:     Mental Status: He is  alert.     Comments: Mild weakness and atrophy in the right upper extremity with fasciculations noted in the first dorsal interossei.  Strength is 4 out of 5 in the bicep tricep wrist extensors and grip bilaterally.  Positive hyperreflexia in the lower extremities.  Nitschke is bilaterally positive.  Psychiatric:        Mood and Affect: Mood normal.        Behavior: Behavior normal.        Thought Content: Thought content normal.        Judgment: Judgment normal.      Assessment/Plan Spondylosis and stenosis with myelopathy C4-C5.  Plan: Anterior cervical decompression C4-C5.  Fusion C4-C5.  Stefani Dama, MD 09/26/2022, 7:32 AM

## 2022-09-27 ENCOUNTER — Encounter (HOSPITAL_COMMUNITY): Payer: Self-pay | Admitting: Neurological Surgery

## 2022-09-27 DIAGNOSIS — M50221 Other cervical disc displacement at C4-C5 level: Secondary | ICD-10-CM | POA: Diagnosis not present

## 2022-09-27 MED ORDER — OXYCODONE-ACETAMINOPHEN 5-325 MG PO TABS: 1.0000 | ORAL_TABLET | ORAL | 0 refills | Status: DC | PRN

## 2022-09-27 MED ORDER — METHOCARBAMOL 500 MG PO TABS: 500.0000 mg | ORAL_TABLET | Freq: Four times a day (QID) | ORAL | 1 refills | Status: DC | PRN

## 2022-09-27 NOTE — Evaluation (Signed)
Occupational Therapy Evaluation Patient Details Name: [name] [name] MRN: [id number] DOB: [birth year] Today's Date: 09/27/2022   History of Present Illness Pt is a 72 y.o. male presenting with herniated nucleus pulposus C4-5 with cervical myelopathy. Pt is now anterior cervical decompression and C4-5 arthrodesis with structural titanium spacer and morselized allograft and autograft 09/26/2022. PMH significant for arthritis, bil early cataracts, R carpal tunnel, osteopenia, pre-diabetes, R lower lobe lung mass, TIA, L knee surgery, Lumbar fusion, R total shoulder arthroplasty.   Clinical Impression   PTA, pt was independent in ADL and IADL. Pt lived with his wife. Upon eval, pt performing LB ADL and functional mobility with up to supervision assist. Pt educated and demonstrating compensatory techniques for UB dressing, LB dressing, shower transfers, toileting, grooming, car transfers, and stair training within precautions. Recommending discharge home with no OT follow up at this time. OT to sign off. Thank you for this order.      Recommendations for follow up therapy are one component of a multi-disciplinary discharge planning process, led by the attending physician.  Recommendations may be updated based on patient status, additional functional criteria and insurance authorization.   Follow Up Recommendations  No OT follow up     Assistance Recommended at Discharge PRN  Patient can return home with the following A little help with bathing/dressing/bathroom;Assistance with cooking/housework;Assist for transportation;Help with stairs or ramp for entrance    Functional Status Assessment  Patient has had a recent decline in their functional status and demonstrates the ability to make significant improvements in function in a reasonable and predictable amount of time.  Equipment Recommendations  None recommended by OT (Pt not interested)    Recommendations for Other Services        Precautions / Restrictions Precautions Precautions: Cervical Precaution Booklet Issued: Yes (comment) Precaution Comments: All precautions reviewed within the context of ADL Required Braces or Orthoses:  (no brace needed orders) Restrictions Weight Bearing Restrictions: No      Mobility Bed Mobility Overal bed mobility: Needs Assistance Bed Mobility: Rolling, Sidelying to Sit Rolling: Supervision Sidelying to sit: Supervision       General bed mobility comments: cues for technique and initial education    Transfers Overall transfer level: Modified independent Equipment used: None               General transfer comment: increased time.      Balance Overall balance assessment: Mild deficits observed, not formally tested                                         ADL either performed or assessed with clinical judgement   ADL Overall ADL's : Needs assistance/impaired Eating/Feeding: Independent   Grooming: Supervision/safety;Standing   Upper Body Bathing: Supervision/ safety;Standing   Lower Body Bathing: Supervison/ safety;Sit to/from stand   Upper Body Dressing : Independent   Lower Body Dressing: Supervision/safety;Sit to/from stand   Toilet Transfer: Supervision/safety;Ambulation;Regular Toilet     Toileting - Clothing Manipulation Details (indicate cue type and reason): reviewed compensatory techniques Tub/ Shower Transfer: Walk-in shower;Ambulation;Supervision/safety   Functional mobility during ADLs: Supervision/safety General ADL Comments: mild balance deficits observed. Supervision approaching mod I.     Vision Baseline Vision/History: 1 Wears glasses Ability to See in Adequate Light: 0 Adequate Patient Visual Report: No change from baseline Vision Assessment?: No apparent visual deficits     Perception     Praxis  Pertinent Vitals/Pain Pain Assessment Pain Assessment: Faces Faces Pain Scale: Hurts little more Pain  Location: operative site, shoulders, R hip Pain Descriptors / Indicators: Aching, Spasm, Sore, Operative site guarding Pain Intervention(s): Limited activity within patient's tolerance, Monitored during session     Hand Dominance     Extremity/Trunk Assessment Upper Extremity Assessment Upper Extremity Assessment: Generalized weakness (mild numbness that pt reports is improving in bil hands)   Lower Extremity Assessment Lower Extremity Assessment: Generalized weakness;RLE deficits/detail RLE Deficits / Details: R hip pain   Cervical / Trunk Assessment Cervical / Trunk Assessment: Neck Surgery   Communication Communication Communication: No difficulties   Cognition Arousal/Alertness: Awake/alert Behavior During Therapy: WFL for tasks assessed/performed Overall Cognitive Status: Within Functional Limits for tasks assessed                                       General Comments  VSS.    Exercises     Shoulder Instructions      Home Living Family/patient expects to be discharged to:: Private residence Living Arrangements: Spouse/significant other Available Help at Discharge: Family Type of Home: House Home Access: Stairs to enter [employer] of Steps: 3 Entrance Stairs-Rails: Right Home Layout: One level;Other (Comment);Laundry or work area in basement (has a basement, but does not have to go down there)     Bathroom Shower/Tub: Producer, television/film/video: Handicapped height     Home Equipment: Other (comment) (wife has knee scooter)          Prior Functioning/Environment Prior Level of Function : Independent/Modified Independent;Driving             Mobility Comments: no AD ADLs Comments: Indep in ADL and IADL        OT Problem List: Decreased strength;Decreased activity tolerance;Impaired balance (sitting and/or standing);Decreased knowledge of use of DME or AE;Decreased knowledge of precautions;Pain      OT  Treatment/Interventions:      OT Goals(Current goals can be found in the care plan section) Acute Rehab OT Goals Patient Stated Goal: decreased pain OT Goal Formulation: With patient  OT Frequency:      Co-evaluation              AM-PAC OT "6 Clicks" Daily Activity     Outcome Measure Help from another person eating meals?: None Help from another person taking care of personal grooming?: A Little Help from another person toileting, which includes using toliet, bedpan, or urinal?: A Little Help from another person bathing (including washing, rinsing, drying)?: A Little Help from another person to put on and taking off regular upper body clothing?: None Help from another person to put on and taking off regular lower body clothing?: A Little 6 Click Score: 20   End of Session Equipment Utilized During Treatment: Gait belt Nurse Communication: Mobility status  Activity Tolerance: Patient tolerated treatment well Patient left: in bed;with call bell/phone within reach  OT Visit Diagnosis: Unsteadiness on feet (R26.81);Muscle weakness (generalized) (M62.81);Other abnormalities of gait and mobility (R26.89);Pain Pain - Right/Left: Right Pain - part of body: Hip (back, bil shoulders)                Time: 4352-5518 OT Time Calculation (min): 20 min Charges:  OT General Charges $OT Visit: 1 Visit OT Evaluation $OT Eval Low Complexity: 1 Low  [name] [name], OTR/L [facility] Acute Rehabilitation Office: [phone]  [name] [name] 09/27/2022, 9:08 AM

## 2022-09-27 NOTE — Progress Notes (Signed)
PT Cancellation Note  Patient Details Name: [name] [name] MRN: [id number] DOB: Dec 05, 1950   Cancelled Treatment:    Reason Eval/Treat Not Completed: PT screened, no needs identified, will sign off. Discussed pt case with OT who reports pt is currently mobilizing without assistance and does not require a formal PT evaluation at this time. PT signing off. If needs change, please reconsult.     Marylynn Pearson 09/27/2022, 9:13 AM  Conni Slipper, PT, DPT Acute Rehabilitation Services Secure Chat Preferred Office: [phone]

## 2022-09-27 NOTE — Anesthesia Postprocedure Evaluation (Signed)
Anesthesia Post Note  Patient: Lincoln Ginley  Procedure(s) Performed: Cervical four-five Anterior Cervical Decompression Fusion     Patient location during evaluation: PACU Anesthesia Type: General Level of consciousness: awake and alert Pain management: pain level controlled Vital Signs Assessment: post-procedure vital signs reviewed and stable Respiratory status: spontaneous breathing, nonlabored ventilation, respiratory function stable and patient connected to nasal cannula oxygen Cardiovascular status: blood pressure returned to baseline and stable Postop Assessment: no apparent nausea or vomiting Anesthetic complications: no   There were no known notable events for this encounter.  Last Vitals:  Vitals:   09/27/22 0341 09/27/22 0721  BP: 123/70 136/76  Pulse: 68 71  Resp: 20 17  Temp: 36.6 C 36.8 C  SpO2: 98% 97%    Last Pain:  Vitals:   09/27/22 0902  TempSrc:   PainSc: 3    Pain Goal: Patients Stated Pain Goal: 0 (09/27/22 0407)                 Kennieth Rad

## 2022-09-27 NOTE — Progress Notes (Signed)
Orthopedic Tech Progress Note Patient Details:  [name] [name] 1951-01-06 [id number]  Ortho Devices Type of Ortho Device: Soft collar Ortho Device/Splint Location: Neck Ortho Device/Splint Interventions: Application   Post Interventions Patient Tolerated: Well  [name] [name] 09/27/2022, 9:24 AM

## 2022-09-27 NOTE — Discharge Summary (Signed)
Physician Discharge Summary  Patient ID: [name] [name] MRN: [id number] DOB/AGE: December 02, 1950 72 y.o.  Admit date: 09/26/2022 Discharge date: 09/27/2022  Admission Diagnoses: Cervical spondylosis with myelopathy  Discharge Diagnoses: Cervical myelopathy Principal Problem:   Cervical myelopathy [facility])   Discharged Condition: good  Hospital Course: He was admitted to undergo surgery which she tolerated well.  He feels better and his gait instability.  Consults: None  Significant Diagnostic Studies: None  Treatments: surgery: See op note  Discharge Exam: Blood pressure 136/76, pulse 71, temperature 98.2 F (36.8 C), temperature source Oral, resp. rate 17, height 6\' 1"  (1.854 m), weight 89.8 kg, SpO2 97 %. Incision is clean and dry Station and gait are intact.  Disposition: Discharge disposition: 01-Home or Self Care       Discharge Instructions     Call MD for:  redness, tenderness, or signs of infection (pain, swelling, redness, odor or green/yellow discharge around incision site)   Complete by: As directed    Call MD for:  severe uncontrolled pain   Complete by: As directed    Call MD for:  temperature >100.4   Complete by: As directed    Diet - low sodium heart healthy   Complete by: As directed    Discharge instructions   Complete by: As directed    Okay to shower. Do not apply salves or appointments to incision. No heavy lifting with the upper extremities greater than 10 pounds. May resume driving when not requiring pain medication and patient feels comfortable with doing so.   Incentive spirometry RT   Complete by: As directed    Increase activity slowly   Complete by: As directed       Allergies as of 09/27/2022       Reactions   Statins Other (See Comments)   MYALGIAS   Chantix [varenicline] Other (See Comments)   Ineffective; patient did not like the way it made him feel   Tricor [fenofibrate] Other (See Comments)   ineffective for cholesterol (2014)    Zetia [ezetimibe] Other (See Comments)   ineffective for cholesterol        Medication List     TAKE these medications    aspirin EC 81 MG tablet Take 81 mg by mouth in the morning. Swallow whole.   clindamycin 150 MG capsule Commonly known as: CLEOCIN Take 600 mg by mouth See admin instructions. Take 4 capsules (600 mg) by mouth prior to dental appointments   cyclobenzaprine 10 MG tablet Commonly known as: FLEXERIL Take 10 mg by mouth 3 (three) times daily as needed for muscle spasms.   donepezil 10 MG tablet Commonly known as: ARICEPT Take 10 mg by mouth in the morning.   HYDROcodone-acetaminophen 5-325 MG tablet Commonly known as: NORCO/VICODIN Take 1-1.5 tablets by mouth every 6 (six) hours as needed for moderate pain.   ibuprofen 200 MG tablet Commonly known as: ADVIL Take 600 mg by mouth every 6 (six) hours as needed (pain.).   Magnesium 500 MG Tabs Take 500 mg by mouth in the morning.   methocarbamol 500 MG tablet Commonly known as: ROBAXIN Take 1 tablet (500 mg total) by mouth every 6 (six) hours as needed for muscle spasms.   oxyCODONE-acetaminophen 5-325 MG tablet Commonly known as: PERCOCET/ROXICET Take 1-2 tablets by mouth every 4 (four) hours as needed for severe pain.   Praluent 150 MG/ML Soaj Generic drug: Alirocumab INJECT 1 DOSE INTO THE SKIN EVERY 14 (FOURTEEN) DAYS.   sertraline 100 MG tablet Commonly  known as: ZOLOFT Take 100 mg by mouth in the morning.   tamsulosin 0.4 MG Caps capsule Commonly known as: FLOMAX Take 0.4 mg by mouth in the morning.         Signed: Shary Key Josephyne Tarter 09/27/2022, 9:05 AM

## 2022-09-27 NOTE — Progress Notes (Signed)
Patient alert and oriented, mae's well, voiding adequate amount of urine, swallowing without difficulty, no c/o pain at time of discharge. Patient discharged home with family. Script and discharged instructions given to patient. Patient and family stated understanding of instructions given. Patient has an appointment with Dr. Elsner  

## 2022-09-27 NOTE — Discharge Instructions (Signed)
Wound Care Leave incision open to air. You may shower. Do not scrub directly on incision.  Do not put any creams, lotions, or ointments on incision. Activity Walk each and every day, increasing distance each day. No lifting greater than 5 lbs.  Avoid excessive neck motion. No driving for 2 weeks; may ride as a passenger locally.  Diet Resume your normal diet.  Return to Work Will be discussed at you follow up appointment. Call Your Doctor If Any of These Occur Redness, drainage, or swelling at the wound.  Temperature greater than 101 degrees. Severe pain not relieved by pain medication. Increased difficulty swallowing. Incision starts to come apart. Follow Up Appt Call [phone])   for problems.  If you have any hardware placed in your spine, you will need an x-ray before your appointment.

## 2022-10-20 ENCOUNTER — Inpatient Hospital Stay: Payer: Medicare Other | Attending: Internal Medicine

## 2022-10-20 ENCOUNTER — Ambulatory Visit (HOSPITAL_COMMUNITY)
Admission: RE | Admit: 2022-10-20 | Discharge: 2022-10-20 | Disposition: A | Discharge status: Home / Self Care | Payer: Medicare Other | Source: Ambulatory Visit | Attending: Internal Medicine | Admitting: Internal Medicine

## 2022-10-20 DIAGNOSIS — Z85118 Personal history of other malignant neoplasm of bronchus and lung: Secondary | ICD-10-CM | POA: Insufficient documentation

## 2022-10-20 DIAGNOSIS — Z9221 Personal history of antineoplastic chemotherapy: Secondary | ICD-10-CM | POA: Insufficient documentation

## 2022-10-20 DIAGNOSIS — C3490 Malignant neoplasm of unspecified part of unspecified bronchus or lung: Secondary | ICD-10-CM

## 2022-10-20 LAB — CBC WITH DIFFERENTIAL (CANCER CENTER ONLY)
Abs Immature Granulocytes: 0.03 10*3/uL (ref 0.00–0.07)
Basophils Absolute: 0 10*3/uL (ref 0.0–0.1)
Basophils Relative: 0 %
Eosinophils Absolute: 0.1 10*3/uL (ref 0.0–0.5)
Eosinophils Relative: 1 %
HCT: 42.4 % (ref 39.0–52.0)
Hemoglobin: 14.3 g/dL (ref 13.0–17.0)
Immature Granulocytes: 0 %
Lymphocytes Relative: 14 %
Lymphs Abs: 1.1 10*3/uL (ref 0.7–4.0)
MCH: 30 pg (ref 26.0–34.0)
MCHC: 33.7 g/dL (ref 30.0–36.0)
MCV: 88.9 fL (ref 80.0–100.0)
Monocytes Absolute: 0.5 10*3/uL (ref 0.1–1.0)
Monocytes Relative: 7 %
Neutro Abs: 6.5 10*3/uL (ref 1.7–7.7)
Neutrophils Relative %: 78 %
Platelet Count: 206 10*3/uL (ref 150–400)
RBC: 4.77 MIL/uL (ref 4.22–5.81)
RDW: 13.2 % (ref 11.5–15.5)
WBC Count: 8.4 10*3/uL (ref 4.0–10.5)
nRBC: 0 % (ref 0.0–0.2)

## 2022-10-20 LAB — CMP (CANCER CENTER ONLY)
ALT: 27 U/L (ref 0–44)
AST: 25 U/L (ref 15–41)
Albumin: 4.1 g/dL (ref 3.5–5.0)
Alkaline Phosphatase: 80 U/L (ref 38–126)
Anion gap: 5 (ref 5–15)
BUN: 22 mg/dL (ref 8–23)
CO2: 29 mmol/L (ref 22–32)
Calcium: 9.5 mg/dL (ref 8.9–10.3)
Chloride: 104 mmol/L (ref 98–111)
Creatinine: 0.91 mg/dL (ref 0.61–1.24)
GFR, Estimated: >60 mL/min (ref >60)
Glucose, Bld: 120 mg/dL — ABNORMAL HIGH (ref 70–99)
Potassium: 4.3 mmol/L (ref 3.5–5.1)
Sodium: 138 mmol/L (ref 135–145)
Total Bilirubin: 0.6 mg/dL (ref 0.3–1.2)
Total Protein: 7 g/dL (ref 6.5–8.1)

## 2022-10-20 MED ORDER — IOHEXOL 300 MG/ML  SOLN
100.0000 mL | Freq: Once | INTRAMUSCULAR | Status: AC | PRN
  Administered 2022-10-20: 75 mL via INTRAVENOUS

## 2022-10-20 MED ORDER — SODIUM CHLORIDE (PF) 0.9 % IJ SOLN
INTRAMUSCULAR | Status: AC
  Filled 2022-10-20: qty 50

## 2022-10-23 ENCOUNTER — Encounter (HOSPITAL_BASED_OUTPATIENT_CLINIC_OR_DEPARTMENT_OTHER): Payer: Self-pay

## 2022-10-23 ENCOUNTER — Ambulatory Visit (HOSPITAL_BASED_OUTPATIENT_CLINIC_OR_DEPARTMENT_OTHER): Payer: Medicare Other | Admitting: Family

## 2022-10-23 ENCOUNTER — Telehealth (HOSPITAL_BASED_OUTPATIENT_CLINIC_OR_DEPARTMENT_OTHER): Payer: Self-pay

## 2022-10-23 NOTE — Progress Notes (Deleted)
Office Visit    Patient Name: [name] [name] Date of Encounter: 10/23/2022  PCP:  Gaynelle Arabian, MD   [place]  Cardiologist:  Pixie Casino, MD  Advanced Practice Provider:  No care team member to display Electrophysiologist:  None     Chief Complaint    [name] [name] is a 72 y.o. male presents today for follow-up after cardiac catheterization  Past Medical History    Past Medical History:  Diagnosis Date   Arthritis    Carpal tunnel syndrome    Right   Cataracts, bilateral    very mild   Coronary artery disease    Depression    Early cataracts, bilateral    Fatty liver    Gallstones    GERD (gastroesophageal reflux disease)    Headache 2011   HX OF VISUAL MIGRAINES - NO PAIN --MIGRAINES ATTRIBUTED TO PT CUTTING NICOTINE PATCH IN HALF BEFORE APPLYING TO HIS SKIN.     Hepatitis 2001   C -treated and no problems since  2001 'cured'   History of hiatal hernia    History of kidney stones    found on CT Scan   HOH (hard of hearing)    lung ca dx'd 12/06/19   RIGHT LUNG   Muscle cramps    hands and legs   Osteopenia    Pain    KNEE OA   Pre-diabetes    Right lower lobe lung mass    Shortness of breath dyspnea    "SOMETIMES FEELS LIKE I CAN'T CATCH A DEEP BREATH "- ON GOING FOR YEARS - STATES MEDICAL DOCTOR SENT HIM FOR CARDIAC WORK UP 2011 - [place]   Stroke ([place])    HX POSSIBLE TIA - RT LEG NUMBNESS AND TINGLING FINGERS AND NUMBNESS RT FACE - ALL RESOLVED ; STATES NEUROLOGIST COULD NOT FIND ANY EVIDENCE OF STROKE - THOUGHT PT'S EPISODE MIGHT BE FROM CUTTING NICOTINE PATCH IN HALF AND APPLYING TO SKIN   Trigger finger    LEFT MIDDLE FINGER   Wears glasses    Past Surgical History:  Procedure Laterality Date   ANTERIOR CERVICAL DECOMP/DISCECTOMY FUSION N/A 09/26/2022   Procedure: Cervical four-five Anterior Cervical Decompression Fusion;  Surgeon: Kristeen Miss, MD;  Location: [place];  Service: Neurosurgery;   Laterality: N/A;   BRONCHIAL BIOPSY  02/24/2020   Procedure: BRONCHIAL BIOPSIES;  Surgeon: Collene Gobble, MD;  Location[facility] ENDOSCOPY;  Service: Pulmonary;;   BRONCHIAL BRUSHINGS  02/24/2020   Procedure: BRONCHIAL BRUSHINGS;  Surgeon: Collene Gobble, MD;  Location: [facility] ENDOSCOPY;  Service: Pulmonary;;   BRONCHIAL NEEDLE ASPIRATION BIOPSY  02/24/2020   Procedure: BRONCHIAL NEEDLE ASPIRATION BIOPSIES;  Surgeon: Collene Gobble, MD;  Location: [facility] ENDOSCOPY;  Service: Pulmonary;;   BRONCHIAL WASHINGS  02/24/2020   Procedure: BRONCHIAL WASHINGS;  Surgeon: Collene Gobble, MD;  Location: [facility] ENDOSCOPY;  Service: Pulmonary;;   COLONOSCOPY     INTERCOSTAL NERVE BLOCK Right 04/28/2020   Procedure: INTERCOSTAL NERVE BLOCK;  Surgeon: Lajuana Matte, MD;  Location: [place];  Service: Thoracic;  Laterality: Right;   INTRAVASCULAR PRESSURE WIRE/FFR STUDY N/A 08/09/2022   Procedure: INTRAVASCULAR PRESSURE WIRE/FFR STUDY;  Surgeon: Early Osmond, MD;  Location: [place] CV LAB;  Service: Cardiovascular;  Laterality: N/A;   left ankle surgery     fractured   LEFT HEART CATH AND CORONARY ANGIOGRAPHY N/A 06/29/2021   Procedure: LEFT HEART CATH AND CORONARY ANGIOGRAPHY;  Surgeon: Early Osmond,  MD;  Location: [place] CV LAB;  Service: Cardiovascular;  Laterality: N/A;   LEFT HEART CATH AND CORONARY ANGIOGRAPHY N/A 08/09/2022   Procedure: LEFT HEART CATH AND CORONARY ANGIOGRAPHY;  Surgeon: Early Osmond, MD;  Location: [place] CV LAB;  Service: Cardiovascular;  Laterality: N/A;   left knee surgery     MENISCAL REMOVED   left shoulder     torn ligaments left shoulder   LUMBAR FUSION     L3 L4 L5   NODE DISSECTION Right 04/28/2020   Procedure: NODE DISSECTION;  Surgeon: Lajuana Matte, MD;  Location: [place] OR;  Service: Thoracic;  Laterality: Right;   RIGHT CARPAL TUNNEL RELEASE  2011   RIGHT KNEE ARTHROSCOPY   AUG OR SEPT 2015   AT [place] Left    2007   THORACOSCOPY Right 04/28/2020   XI ROBOTIC ASSISTED THORASCOPY-LOBECTOMY (Right)   TONSILLECTOMY     as child   TOTAL KNEE ARTHROPLASTY Left 07/14/2013   Procedure: LEFT TOTAL KNEE ARTHROPLASTY;  Surgeon: Mauri Pole, MD;  Location: [facility];  Service: Orthopedics;  Laterality: Left;   TOTAL KNEE ARTHROPLASTY Right 07/21/2014   Procedure: RIGHT TOTAL KNEE ARTHROPLASTY;  Surgeon: Mauri Pole, MD;  Location: [facility];  Service: Orthopedics;  Laterality: Right;   TOTAL SHOULDER ARTHROPLASTY Right 10/17/2019   Procedure: RIGHT REVERSE TOTAL SHOULDER REPLACEMENT;  Surgeon: Netta Cedars, MD;  Location: [facility];  Service: Orthopedics;  Laterality: Right;  interscalene block   TRIGGER FINGER RELEASE Left    VIDEO BRONCHOSCOPY WITH ENDOBRONCHIAL NAVIGATION Right 02/24/2020   Procedure: VIDEO BRONCHOSCOPY WITH ENDOBRONCHIAL NAVIGATION;  Surgeon: Collene Gobble, MD;  Location: [facility] ENDOSCOPY;  Service: Pulmonary;  Laterality: Right;   WEDGE RESECTION Right 04/28/2020   XI ROBOTIC ASSISTED THORASCOPY-RIGHT LOWER LOBE WEDGE RESECTION (Right Chest)     Allergies  Allergies  Allergen Reactions   Statins Other (See Comments)    MYALGIAS   Chantix [Varenicline] Other (See Comments)    Ineffective; patient did not like the way it made him feel   Tricor [Fenofibrate] Other (See Comments)    ineffective for cholesterol (2014)   Zetia [Ezetimibe] Other (See Comments)    ineffective for cholesterol    History of Present Illness    [name] [name] is a 72 y.o. male with a hx of hyperlipidemia disease statin intolerance), coronary artery disease, lung cancer  last seen 07/28/22.  Family history of coronary artery disease  Has been trialed on a variety of medications including atorvastatin, Zetia, fenofibrae, pravastatin, pitavastatin which all caused severe myalgias.  He had elevated calcium score of 1222 placing him in the 93rd percentile for age/sex matched control.  LHC 06/29/21 CTO of RCA collateralized by R-R and septal collaterals with mild nonobstructive disease in OM and LAD with high grade disease of distal LCx which is small vessel and subtends a small area. Recommended for medical management.  Seen 07/28/22 with progressive fatigue, chest ache, dyspnea on exertion. Repeat cardiac cath with relatively unchanged burden with CTO of RCA with collaterals with RFR of first obtuse marginal 0.92 recommended for medical management.   Seen 08/28/22 with lightheadedness and echo, carotid duplex ordered.  Echo 09/21/22 LVEF 35-40%, mild LVH, gr1DD, trivial MR. Newly reduced LVEF. Carotid duplex 09/21/22 bilateral 1-39% stenosis.  On 09/26/22 underwent C4-5 anterior cervical decompression fusion  He presents today for follow up. ***  EKGs/Labs/Other Studies Reviewed:  The following studies were reviewed today: Carotid duplex 09/21/22  Echo 09/21/22  LHC 08/09/22   Ost LAD lesion is 15% stenosed.   Mid LAD lesion is 20% stenosed.   Dist Cx lesion is 90% stenosed.   Prox RCA lesion is 100% stenosed.   1st Mrg lesion is 40% stenosed.   1.  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   2.  RFR assessment of the first obtuse marginal was 0.92; medical management should be pursued. 3.  LVEDP of 10 mmHg.   Recommendation: Medical management.  EKG:  EKG is not ordered today.    Recent Labs: 12/19/2021: TSH 0.784 10/20/2022: ALT 27; BUN 22; Creatinine 0.91; Hemoglobin 14.3; Platelet Count 206; Potassium 4.3; Sodium 138  Recent Lipid Panel    Component Value Date/Time   CHOL 153 12/12/2021 1534   TRIG 126 12/12/2021 1534   HDL 53 12/12/2021 1534   CHOLHDL 2.9 12/12/2021 1534   CHOLHDL 5.6 10/31/2007 0830   VLDL 26 10/31/2007 0830   LDLCALC 78 12/12/2021 1534   Home  Medications   No outpatient medications have been marked as taking for the 10/23/22 encounter (Appointment) with Loel Dubonnet, NP.     Review of Systems      All other systems reviewed and are otherwise negative except as noted above.  Physical Exam    VS:  There were no vitals taken for this visit. , BMI There is no height or weight on file to calculate BMI.  Wt Readings from Last 3 Encounters:  09/26/22 198 lb (89.8 kg)  09/08/22 195 lb 9.6 oz (88.7 kg)  08/28/22 198 lb (89.8 kg)     GEN: Well nourished, well developed, in no acute distress. HEENT: normal. Neck: Supple, no JVD, carotid bruits, or masses. Cardiac: RRR, no murmurs, rubs, or gallops. No clubbing, cyanosis, edema.  Radials/PT 2+ and equal bilaterally.  Respiratory:  Respirations regular and unlabored, clear to auscultation bilaterally. GI: Soft, nontender, nondistended. MS: No deformity or atrophy. Skin: Warm and dry, no rash. Neuro:  Strength and sensation are intact. Psych: Normal affect.  Assessment & Plan    CAD - GDMT aspirin, praluent. Heart healthy diet and regular cardiovascular exercise encouraged.  ***   HLD, LDL goal <70 - intolerant to Zetia, statin, feonfibrate with myalgias. Tolerates Praluent. ***  HFrEF -   Carotid stenosis - 09/21/22 carotid duplex bilateral 1-39% stenosis. ***    Disposition: Follow up in 3 month(s) ***with Pixie Casino, MD or APP.  Signed, Loel Dubonnet, NP 10/23/2022, 12:00 PM [place]

## 2022-10-23 NOTE — Telephone Encounter (Signed)
Called patient, he states he forgot about his appointment and apologies for missing it. Got him rescheduled for Friday at 2:45pm with Laurann Montana, NP.  Received via secure chat,   "can we call [name] to check on him and get him rescheduled? He had neck surgery a few weeks ago. but echo also showed newly reduced LVEF so need to get him in to optimize meds"

## 2022-10-24 ENCOUNTER — Inpatient Hospital Stay (HOSPITAL_BASED_OUTPATIENT_CLINIC_OR_DEPARTMENT_OTHER): Payer: Medicare Other | Admitting: Internal Medicine

## 2022-10-24 ENCOUNTER — Other Ambulatory Visit: Payer: Self-pay

## 2022-10-24 VITALS — BP 114/62 | HR 89 | Temp 98.0°F | Resp 14 | Wt 201.1 lb

## 2022-10-24 DIAGNOSIS — Z85118 Personal history of other malignant neoplasm of bronchus and lung: Secondary | ICD-10-CM | POA: Diagnosis present

## 2022-10-24 DIAGNOSIS — C3490 Malignant neoplasm of unspecified part of unspecified bronchus or lung: Secondary | ICD-10-CM | POA: Diagnosis not present

## 2022-10-24 DIAGNOSIS — Z9221 Personal history of antineoplastic chemotherapy: Secondary | ICD-10-CM | POA: Diagnosis not present

## 2022-10-24 NOTE — Progress Notes (Signed)
[place] Telephone:([phone]) [phone]   Fax:([phone]) [phone]  OFFICE PROGRESS NOTE  Gaynelle Arabian, MD 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   PRIOR THERAPY:   1) Status post right lower lobectomy with lymph node dissection under the care of Dr. Kipp Brood on 04/28/2020. 2)  Adjuvant systemic chemotherapy with cisplatin 75 mg/m2 and Alimta 500 mg/mm IV every 3 weeks.  First dose expected on 06/16/2020.  Status post 4 cycles.   CURRENT THERAPY:  Observation.  INTERVAL HISTORY: [name] [name] 72 y.o. male returns to clinic today for 6 months follow-up visit.  The patient is feeling fine today with no concerning complaints.  He had anterior cervical decompression and C4-5 arthrodesis under the care of Dr. Ellene Route on September 26, 2022 and he is recovering well from the surgery.  He denied having any current chest pain, shortness of breath, cough or hemoptysis.  He has no nausea, vomiting, diarrhea or constipation.  He has no headache or visual changes.  He has no recent weight loss or night sweats.  He had repeat CT scan of the chest performed recently and he is here for evaluation and discussion of his scan results.    MEDICAL HISTORY: Past Medical History:  Diagnosis Date   Arthritis    Carpal tunnel syndrome    Right   Cataracts, bilateral    very mild   Coronary artery disease    Depression    Early cataracts, bilateral    Fatty liver    Gallstones    GERD (gastroesophageal reflux disease)    Headache 2011   HX OF VISUAL MIGRAINES - NO PAIN --MIGRAINES ATTRIBUTED TO PT CUTTING NICOTINE PATCH IN HALF BEFORE APPLYING TO HIS SKIN.     Hepatitis 2001   C -treated and no problems since  2001 'cured'   History of hiatal hernia    History of kidney stones    found on CT Scan   HOH (hard of  hearing)    lung ca dx'd 12/06/19   RIGHT LUNG   Muscle cramps    hands and legs   Osteopenia    Pain    KNEE OA   Pre-diabetes    Right lower lobe lung mass    Shortness of breath dyspnea    "SOMETIMES FEELS LIKE I CAN'T CATCH A DEEP BREATH "- ON GOING FOR YEARS - STATES MEDICAL DOCTOR SENT HIM FOR CARDIAC WORK UP 2011 - [place]   Stroke ([place])    HX POSSIBLE TIA - RT LEG NUMBNESS AND TINGLING FINGERS AND NUMBNESS RT FACE - ALL RESOLVED ; STATES NEUROLOGIST COULD NOT FIND ANY EVIDENCE OF STROKE - THOUGHT PT'S EPISODE MIGHT BE FROM CUTTING NICOTINE PATCH IN HALF AND APPLYING TO SKIN   Trigger finger    LEFT MIDDLE FINGER   Wears glasses     ALLERGIES:  is allergic to statins, chantix [varenicline], tricor [fenofibrate], and zetia [ezetimibe].  MEDICATIONS:  Current Outpatient Medications  Medication Sig Dispense Refill   Alirocumab (PRALUENT) 150 MG/ML SOAJ INJECT 1 DOSE INTO THE SKIN EVERY 14 (FOURTEEN) DAYS. 6 mL 3   aspirin EC 81 MG tablet Take 81 mg by mouth in the morning. Swallow whole.     clindamycin (CLEOCIN) 150 MG capsule Take 600 mg by mouth See admin instructions.  Take 4 capsules (600 mg) by mouth prior to dental appointments     cyclobenzaprine (FLEXERIL) 10 MG tablet Take 10 mg by mouth 3 (three) times daily as needed for muscle spasms.     donepezil (ARICEPT) 10 MG tablet Take 10 mg by mouth in the morning.     HYDROcodone-acetaminophen (NORCO/VICODIN) 5-325 MG tablet Take 1-1.5 tablets by mouth every 6 (six) hours as needed for moderate pain.     ibuprofen (ADVIL) 200 MG tablet Take 600 mg by mouth every 6 (six) hours as needed (pain.).     Magnesium 500 MG TABS Take 500 mg by mouth in the morning.     methocarbamol (ROBAXIN) 500 MG tablet Take 1 tablet (500 mg total) by mouth every 6 (six) hours as needed for muscle spasms. 30 tablet 1   oxyCODONE-acetaminophen (PERCOCET/ROXICET) 5-325 MG tablet Take 1-2 tablets by mouth every 4 (four) hours as needed for  severe pain. 30 tablet 0   sertraline (ZOLOFT) 100 MG tablet Take 100 mg by mouth in the morning.     tamsulosin (FLOMAX) 0.4 MG CAPS capsule Take 0.4 mg by mouth in the morning.     No current facility-administered medications for this visit.    SURGICAL HISTORY:  Past Surgical History:  Procedure Laterality Date   ANTERIOR CERVICAL DECOMP/DISCECTOMY FUSION N/A 09/26/2022   Procedure: Cervical four-five Anterior Cervical Decompression Fusion;  Surgeon: Kristeen Miss, MD;  Location: [place];  Service: Neurosurgery;  Laterality: N/A;   BRONCHIAL BIOPSY  02/24/2020   Procedure: BRONCHIAL BIOPSIES;  Surgeon: Collene Gobble, MD;  Location: [facility] ENDOSCOPY;  Service: Pulmonary;;   BRONCHIAL BRUSHINGS  02/24/2020   Procedure: BRONCHIAL BRUSHINGS;  Surgeon: Collene Gobble, MD;  Location: [facility] ENDOSCOPY;  Service: Pulmonary;;   BRONCHIAL NEEDLE ASPIRATION BIOPSY  02/24/2020   Procedure: BRONCHIAL NEEDLE ASPIRATION BIOPSIES;  Surgeon: Collene Gobble, MD;  Location: [facility] ENDOSCOPY;  Service: Pulmonary;;   BRONCHIAL WASHINGS  02/24/2020   Procedure: BRONCHIAL WASHINGS;  Surgeon: Collene Gobble, MD;  Location: [place] ENDOSCOPY;  Service: Pulmonary;;   COLONOSCOPY     INTERCOSTAL NERVE BLOCK Right 04/28/2020   Procedure: INTERCOSTAL NERVE BLOCK;  Surgeon: Lajuana Matte, MD;  Location: [place];  Service: Thoracic;  Laterality: Right;   INTRAVASCULAR PRESSURE WIRE/FFR STUDY N/A 08/09/2022   Procedure: INTRAVASCULAR PRESSURE WIRE/FFR STUDY;  Surgeon: Early Osmond, MD;  Location: [place] CV LAB;  Service: Cardiovascular;  Laterality: N/A;   left ankle surgery     fractured   LEFT HEART CATH AND CORONARY ANGIOGRAPHY N/A 06/29/2021   Procedure: LEFT HEART CATH AND CORONARY ANGIOGRAPHY;  Surgeon: Early Osmond, MD;  Location: [place] CV LAB;  Service: Cardiovascular;  Laterality: N/A;   LEFT HEART CATH AND CORONARY ANGIOGRAPHY N/A 08/09/2022   Procedure: LEFT HEART CATH AND CORONARY ANGIOGRAPHY;   Surgeon: Early Osmond, MD;  Location: [place] CV LAB;  Service: Cardiovascular;  Laterality: N/A;   left knee surgery     MENISCAL REMOVED   left shoulder     torn ligaments left shoulder   LUMBAR FUSION     L3 L4 L5   NODE DISSECTION Right 04/28/2020   Procedure: NODE DISSECTION;  Surgeon: Lajuana Matte, MD;  Location: [place] [facility];  Service: Thoracic;  Laterality: Right;   RIGHT CARPAL TUNNEL RELEASE  2011   RIGHT KNEE ARTHROSCOPY   AUG OR SEPT 2015   AT [place]  ROTATOR CUFF REPAIR Left    2007   THORACOSCOPY Right 04/28/2020   XI ROBOTIC ASSISTED THORASCOPY-LOBECTOMY (Right)   TONSILLECTOMY     as child   TOTAL KNEE ARTHROPLASTY Left 07/14/2013   Procedure: LEFT TOTAL KNEE ARTHROPLASTY;  Surgeon: Mauri Pole, MD;  Location[facility];  Service: Orthopedics;  Laterality: Left;   TOTAL KNEE ARTHROPLASTY Right 07/21/2014   Procedure: RIGHT TOTAL KNEE ARTHROPLASTY;  Surgeon: Mauri Pole, MD;  Location: [facility];  Service: Orthopedics;  Laterality: Right;   TOTAL SHOULDER ARTHROPLASTY Right 10/17/2019   Procedure: RIGHT REVERSE TOTAL SHOULDER REPLACEMENT;  Surgeon: Netta Cedars, MD;  Location[facility];  Service: Orthopedics;  Laterality: Right;  interscalene block   TRIGGER FINGER RELEASE Left    VIDEO BRONCHOSCOPY WITH ENDOBRONCHIAL NAVIGATION Right 02/24/2020   Procedure: VIDEO BRONCHOSCOPY WITH ENDOBRONCHIAL NAVIGATION;  Surgeon: Collene Gobble, MD;  Location: [facility] ENDOSCOPY;  Service: Pulmonary;  Laterality: Right;   WEDGE RESECTION Right 04/28/2020   XI ROBOTIC ASSISTED THORASCOPY-RIGHT LOWER LOBE WEDGE RESECTION (Right Chest)     REVIEW OF SYSTEMS:  A comprehensive review of systems was negative.   PHYSICAL EXAMINATION: General appearance: alert, cooperative, and no distress Head: Normocephalic, without obvious abnormality, atraumatic Neck: no adenopathy, no JVD, supple, symmetrical, trachea midline, and thyroid not enlarged,  symmetric, no tenderness/mass/nodules Lymph nodes: Cervical, supraclavicular, and axillary nodes normal. Resp: clear to auscultation bilaterally Back: symmetric, no curvature. ROM normal. No CVA tenderness. Cardio: regular rate and rhythm, S1, S2 normal, no murmur, click, rub or gallop GI: soft, non-tender; bowel sounds normal; no masses,  no organomegaly Extremities: extremities normal, atraumatic, no cyanosis or edema  ECOG PERFORMANCE STATUS: 1 - Symptomatic but completely ambulatory   Blood pressure 114/62, pulse 89, temperature 98 F (36.7 C), temperature source Oral, resp. rate 14, weight 201 lb 1.6 oz (91.2 kg), SpO2 96 %.  LABORATORY DATA: Lab Results  Component Value Date   WBC 8.4 10/20/2022   HGB 14.3 10/20/2022   HCT 42.4 10/20/2022   MCV 88.9 10/20/2022   PLT 206 10/20/2022      Chemistry      Component Value Date/Time   NA 138 10/20/2022 1359   NA 141 08/07/2022 0903   K 4.3 10/20/2022 1359   CL 104 10/20/2022 1359   CO2 29 10/20/2022 1359   BUN 22 10/20/2022 1359   BUN 22 08/07/2022 0903   CREATININE 0.91 10/20/2022 1359      Component Value Date/Time   CALCIUM 9.5 10/20/2022 1359   ALKPHOS 80 10/20/2022 1359   AST 25 10/20/2022 1359   ALT 27 10/20/2022 1359   BILITOT 0.6 10/20/2022 1359       RADIOGRAPHIC STUDIES: CT Chest W Contrast  Result Date: 10/21/2022 CLINICAL DATA:  Staging non-small-cell lung cancer. * Tracking Code: BO * EXAM: CT CHEST WITH CONTRAST TECHNIQUE: Multidetector CT imaging of the chest was performed during intravenous contrast administration. RADIATION DOSE REDUCTION: This exam was performed according to the departmental dose-optimization program which includes automated exposure control, adjustment of the mA and/or kV according to patient size and/or use of iterative reconstruction technique. CONTRAST:  16mL OMNIPAQUE IOHEXOL 300 MG/ML  SOLN COMPARISON:  04/17/2022 and older CT FINDINGS: Cardiovascular: Heart is nonenlarged. No  pericardial effusion. Diffuse coronary artery calcifications. Normal caliber thoracic aorta with scattered vascular calcifications as well. Bovine type aortic arch. Mediastinum/Nodes: No specific abnormal lymph node enlargement identified in the axillary region, hilum or mediastinum. Normal caliber thoracic esophagus. Lungs/Pleura: Surgical changes  from right-sided lower lobectomy. Scattered bilateral lung scarring and fibrotic changes as well. No consolidation, pneumothorax or effusion. There is a small nodule in the right lung base which previously measured 5 mm and today on series 5 image 123 measures 6 mm. The oldest available post surgical CT is January 2022 and this nodule is essentially stable. This has demonstrated more than 2 years of stability. Upper Abdomen: Adrenal glands are preserved in the upper abdomen. Dependent stones in the gallbladder. The spleen has several enhancing nodules. These are unchanged from the previous exam. Example series 2, image 160 measuring 13 mm. Some of this could relate to the phase of contrast but is indeterminate. The spleen also is slightly enlarged overall. These have been present since at least 2022. Please correlate with any prior workup or additional evaluation when appropriate Musculoskeletal: Scattered degenerative changes along the spine. Streak artifact related to the patient's right shoulder arthroplasty. IMPRESSION: Surgical changes from right lower lobectomy with scattered scarring and fibrotic changes. Stable 6 mm right lung nodule going back to January 2022. Recommend simple continued surveillance as per the lung cancer history. Splenomegaly with multiple enhancing splenic lesions again unchanged from prior. Gallstones Aortic Atherosclerosis (ICD10-I70.0). Electronically Signed   By: Jill Side M.D.   On: 10/21/2022 11:57   DG Cervical Spine 2 or 3 views  Result Date: 09/26/2022 CLINICAL DATA:  C4-C5 anterior cervical decompression and fusion EXAM:  CERVICAL SPINE - 2-3 VIEW COMPARISON:  05/09/2018 FINDINGS: Image 1 @ 1309 hours: Anterior metallic probe projects over the anterior aspect of the superior endplate C5. Image 2 @ 1403 hours: Anterior plate and screws with intervening disc prosthesis at C4-C5. Osseous demineralization. Scattered facet degenerative changes. IMPRESSION: Anterior fusion C4-C5. Electronically Signed   By: Lavonia Dana M.D.   On: 09/26/2022 14:18     ASSESSMENT AND PLAN: This is a very pleasant 72 years old white male with a stage IIIa non-small cell lung cancer, mucinous adenocarcinoma status post right lower lobectomy with lymph node dissection in August 2021. The patient underwent adjuvant systemic chemotherapy with cisplatin and Alimta status post 4 cycles. The patient tolerated the previous 4 cycles of his treatment well with no concerning adverse effect except for fatigue. The patient is currently on observation and he is feeling fine with no concerning complaints. He had repeat CT scan of the chest performed recently.  I personally and independently reviewed the scan and discussed results with the patient today. His scan showed no concerning findings for disease recurrence or metastasis. I recommended for him to continue on observation with repeat CT scan of the chest in 6 months. The patient was advised to call immediately if he has any other concerning symptoms in the interval. The patient voices understanding of current disease status and treatment options and is in agreement with the current care plan.  All questions were answered. The patient knows to call the clinic with any problems, questions or concerns. We can certainly see the patient much sooner if necessary.  Disclaimer: This note was dictated with voice recognition software. Similar sounding words can inadvertently be transcribed and may not be corrected upon review.

## 2022-10-27 ENCOUNTER — Telehealth (HOSPITAL_BASED_OUTPATIENT_CLINIC_OR_DEPARTMENT_OTHER): Payer: Self-pay

## 2022-10-27 ENCOUNTER — Ambulatory Visit (INDEPENDENT_AMBULATORY_CARE_PROVIDER_SITE_OTHER): Payer: Medicare Other | Admitting: Family

## 2022-10-27 ENCOUNTER — Encounter (HOSPITAL_BASED_OUTPATIENT_CLINIC_OR_DEPARTMENT_OTHER): Payer: Self-pay | Admitting: Family

## 2022-10-27 ENCOUNTER — Other Ambulatory Visit (HOSPITAL_COMMUNITY): Payer: Self-pay

## 2022-10-27 VITALS — BP 122/68 | HR 82 | Ht 73.0 in | Wt 199.0 lb

## 2022-10-27 DIAGNOSIS — I6522 Occlusion and stenosis of left carotid artery: Secondary | ICD-10-CM | POA: Diagnosis not present

## 2022-10-27 DIAGNOSIS — E785 Hyperlipidemia, unspecified: Secondary | ICD-10-CM

## 2022-10-27 DIAGNOSIS — I5022 Chronic systolic (congestive) heart failure: Secondary | ICD-10-CM

## 2022-10-27 DIAGNOSIS — I25118 Atherosclerotic heart disease of native coronary artery with other forms of angina pectoris: Secondary | ICD-10-CM

## 2022-10-27 MED ORDER — ENTRESTO 24-26 MG PO TABS: 1.0000 | ORAL_TABLET | Freq: Two times a day (BID) | ORAL | 5 refills | Status: DC

## 2022-10-27 NOTE — Progress Notes (Unsigned)
1 bottle of Entresto 24-26mg  given today   LOT # [id number] EXP DATE 04/26

## 2022-10-27 NOTE — Patient Instructions (Signed)
Medication Instructions:  Your physician has recommended you make the following change in your medication:   Start: Entresto 24-26mg  twice daily- We have given you a sample bottle today and a free 30 day supply coupon.   *If you need a refill on your cardiac medications before your next appointment, please call your pharmacy*   Lab Work: Please return for Lab work in 2 weeks for BMP. You may come to the...   [facility] (3rd floor) [address] [place], [place] [address]  Open: 8am-Noon and 1pm-4:30pm  Please ring the doorbell on the small table when you exit the elevator and the Lab Tech will come get you  [place] [facility] [address] [place], [place], [place] [address] Open: 8am-1pm, then 2pm-4:30pm   [place]- Please see attached locations sheet stapled to your lab work with address and hours.   If you have labs (blood work) drawn today and your tests are completely normal, you will receive your results only by: [place] (if you have MyChart) OR A paper copy in the mail If you have any lab test that is abnormal or we need to change your treatment, we will call you to review the results.  Follow-Up: At [facility], you and your health needs are our priority.  As part of our continuing mission to provide you with exceptional heart care, we have created designated Provider Care Teams.  These Care Teams include your primary Cardiologist (physician) and Advanced Practice Providers (APPs -  Physician Assistants and Nurse Practitioners) who all work together to provide you with the care you need, when you need it.  We recommend signing up for the patient portal called "MyChart".  Sign up information is provided on this After Visit Summary.  MyChart is used to connect with patients for Virtual Visits (Telemedicine).  Patients are able to view lab/test results, encounter notes, upcoming appointments, etc.  Non-urgent  messages can be sent to your provider as well.   To learn more about what you can do with MyChart, go to [email or web address].    Your next appointment:   Follow up as scheduled   Other Instructions We have referred you over to prep, they will reach out to you!

## 2022-10-27 NOTE — Telephone Encounter (Signed)
Karie Soda, RXTECH  Gerald Stabs, RN The patient currently has access to the requested medication and a Prior Authorization is not needed for the patient/medication per plan. Patient should be able to pick up Rx without delay.     ----- Message ----- From: Gerald Stabs, RN Sent: 10/27/2022   3:28 PM EST To: Rx Prior Auth Team  Pt. Started on Entresto 24-26mg  twice daily today, may need prior auth- Thanks!

## 2022-10-27 NOTE — Progress Notes (Unsigned)
Office Visit    Patient Name: [name] [name] Date of Encounter: 10/27/2022  PCP:  Gaynelle Arabian, MD   [place]  Cardiologist:  Pixie Casino, MD  Advanced Practice Provider:  No care team member to display Electrophysiologist:  None      Chief Complaint    [name] [name] is a 72 y.o. male presents today for follow up after echocardiogram  Past Medical History    Past Medical History:  Diagnosis Date   Arthritis    Carpal tunnel syndrome    Right   Cataracts, bilateral    very mild   Coronary artery disease    Depression    Early cataracts, bilateral    Fatty liver    Gallstones    GERD (gastroesophageal reflux disease)    Headache 2011   HX OF VISUAL MIGRAINES - NO PAIN --MIGRAINES ATTRIBUTED TO PT CUTTING NICOTINE PATCH IN HALF BEFORE APPLYING TO HIS SKIN.     Hepatitis 2001   C -treated and no problems since  2001 'cured'   History of hiatal hernia    History of kidney stones    found on CT Scan   HOH (hard of hearing)    lung ca dx'd 12/06/19   RIGHT LUNG   Muscle cramps    hands and legs   Osteopenia    Pain    KNEE OA   Pre-diabetes    Right lower lobe lung mass    Shortness of breath dyspnea    "SOMETIMES FEELS LIKE I CAN'T CATCH A DEEP BREATH "- ON GOING FOR YEARS - STATES MEDICAL DOCTOR SENT HIM FOR CARDIAC WORK UP 2011 - [place]   Stroke ([place])    HX POSSIBLE TIA - RT LEG NUMBNESS AND TINGLING FINGERS AND NUMBNESS RT FACE - ALL RESOLVED ; STATES NEUROLOGIST COULD NOT FIND ANY EVIDENCE OF STROKE - THOUGHT PT'S EPISODE MIGHT BE FROM CUTTING NICOTINE PATCH IN HALF AND APPLYING TO SKIN   Trigger finger    LEFT MIDDLE FINGER   Wears glasses    Past Surgical History:  Procedure Laterality Date   ANTERIOR CERVICAL DECOMP/DISCECTOMY FUSION N/A 09/26/2022   Procedure: Cervical four-five Anterior Cervical Decompression Fusion;  Surgeon: Kristeen Miss, MD;  Location: [facility];  Service: Neurosurgery;  Laterality: N/A;    BRONCHIAL BIOPSY  02/24/2020   Procedure: BRONCHIAL BIOPSIES;  Surgeon: Collene Gobble, MD;  Location: [facility] ENDOSCOPY;  Service: Pulmonary;;   BRONCHIAL BRUSHINGS  02/24/2020   Procedure: BRONCHIAL BRUSHINGS;  Surgeon: Collene Gobble, MD;  Location: [facility] ENDOSCOPY;  Service: Pulmonary;;   BRONCHIAL NEEDLE ASPIRATION BIOPSY  02/24/2020   Procedure: BRONCHIAL NEEDLE ASPIRATION BIOPSIES;  Surgeon: Collene Gobble, MD;  Location[facility] ENDOSCOPY;  Service: Pulmonary;;   BRONCHIAL WASHINGS  02/24/2020   Procedure: BRONCHIAL WASHINGS;  Surgeon: Collene Gobble, MD;  Location: [facility] ENDOSCOPY;  Service: Pulmonary;;   COLONOSCOPY     INTERCOSTAL NERVE BLOCK Right 04/28/2020   Procedure: INTERCOSTAL NERVE BLOCK;  Surgeon: Lajuana Matte, MD;  Location: [facility];  Service: Thoracic;  Laterality: Right;   INTRAVASCULAR PRESSURE WIRE/FFR STUDY N/A 08/09/2022   Procedure: INTRAVASCULAR PRESSURE WIRE/FFR STUDY;  Surgeon: Early Osmond, MD;  Location: [facility] LAB;  Service: Cardiovascular;  Laterality: N/A;   left ankle surgery     fractured   LEFT HEART CATH AND CORONARY ANGIOGRAPHY N/A 06/29/2021   Procedure: LEFT HEART CATH AND CORONARY ANGIOGRAPHY;  Surgeon: Lenna Sciara  K, MD;  Location: [facility] CV LAB;  Service: Cardiovascular;  Laterality: N/A;   LEFT HEART CATH AND CORONARY ANGIOGRAPHY N/A 08/09/2022   Procedure: LEFT HEART CATH AND CORONARY ANGIOGRAPHY;  Surgeon: Early Osmond, MD;  Location: [facility];  Service: Cardiovascular;  Laterality: N/A;   left knee surgery     MENISCAL REMOVED   left shoulder     torn ligaments left shoulder   LUMBAR FUSION     L3 L4 L5   NODE DISSECTION Right 04/28/2020   Procedure: NODE DISSECTION;  Surgeon: Lajuana Matte, MD;  Location: [facility];  Service: Thoracic;  Laterality: Right;   RIGHT CARPAL TUNNEL RELEASE  2011   RIGHT KNEE ARTHROSCOPY   AUG OR SEPT 2015   AT [place]  Left    2007   THORACOSCOPY Right 04/28/2020   XI ROBOTIC ASSISTED THORASCOPY-LOBECTOMY (Right)   TONSILLECTOMY     as child   TOTAL KNEE ARTHROPLASTY Left 07/14/2013   Procedure: LEFT TOTAL KNEE ARTHROPLASTY;  Surgeon: Mauri Pole, MD;  Location: [facility];  Service: Orthopedics;  Laterality: Left;   TOTAL KNEE ARTHROPLASTY Right 07/21/2014   Procedure: RIGHT TOTAL KNEE ARTHROPLASTY;  Surgeon: Mauri Pole, MD;  Location: [facility];  Service: Orthopedics;  Laterality: Right;   TOTAL SHOULDER ARTHROPLASTY Right 10/17/2019   Procedure: RIGHT REVERSE TOTAL SHOULDER REPLACEMENT;  Surgeon: Netta Cedars, MD;  Location: [facility];  Service: Orthopedics;  Laterality: Right;  interscalene block   TRIGGER FINGER RELEASE Left    VIDEO BRONCHOSCOPY WITH ENDOBRONCHIAL NAVIGATION Right 02/24/2020   Procedure: VIDEO BRONCHOSCOPY WITH ENDOBRONCHIAL NAVIGATION;  Surgeon: Collene Gobble, MD;  Location: [facility] ENDOSCOPY;  Service: Pulmonary;  Laterality: Right;   WEDGE RESECTION Right 04/28/2020   XI ROBOTIC ASSISTED THORASCOPY-RIGHT LOWER LOBE WEDGE RESECTION (Right Chest)     Allergies  Allergies  Allergen Reactions   Statins Other (See Comments)    MYALGIAS   Chantix [Varenicline] Other (See Comments)    Ineffective; patient did not like the way it made him feel   Tricor [Fenofibrate] Other (See Comments)    ineffective for cholesterol (2014)   Zetia [Ezetimibe] Other (See Comments)    ineffective for cholesterol    History of Present Illness    [name] [name] is a 72 y.o. male with a hx of hyperlipidemia disease statin intolerance), coronary artery disease, lung cancer  last seen 08/28/22  Family history of coronary artery disease.  Has been trialed on a variety of medications including atorvastatin, Zetia, fenofibrae, pravastatin, pitavastatin which all caused severe myalgias.  He had elevated calcium score of 1222 placing him in the 93rd percentile for age/sex matched control. LHC 06/29/21 CTO of  RCA collateralized by R-R and septal collaterals with mild nonobstructive disease in OM and LAD with high grade disease of distal LCx which is small vessel and subtends a small area. Recommended for medical management.  Seen 07/28/22 with progressive fatigue, chest ache, dyspnea on exertion. Repeat cardiac cath with relatively unchanged burden with CTO of RCA with collaterals with RFR of first obtuse marginal 0.92 recommended for medical management.   Seen 08/28/22 noting fatigue, vertigo, bilateral hand numbness, balance issues. Echocardiogram and carotid duplex ordered. He was recommended to follow up with Dr. Ellene Route. Carotid duplex 09/21/22 L ICA 1-39% stenosis. Echo 09/21/22 newly reduced LVEF 35-40%, LV global hypokinesis, mild LVH, gr1DD, trivial MR.  He had emergent C4-5  anterior cervical decompression fusion 09/26/22 with Dr. Ellene Route.   He presents today for follow up. Since neck surgery notes hand numbness resolved, balance issues resolved, energy level improving. Still pending left shoulder surgery but plans to consider waiting as was not pleased with results of prior right shoulder surgery. Reviewed echocardiogram. Endorses following low salt diet. Drinks 4-5 sixteen ounce bottles of water per day with electrolyte substitute. Discussed salt content of electrolyte substitute but states he gets leg cramps despite magnesium supplement if he does not drink electrolytes. No chest pain. Exertional dyspnea stable. Interested in being more active. Did take his last Praluent yesterday and is not sure whether his insurance prefers him continue with Praluent or switch to [place].  EKGs/Labs/Other Studies Reviewed:   The following studies were reviewed today:  Echo 09/21/22   1. Left ventricular ejection fraction, by estimation, is 35 to 40%. Left  ventricular ejection fraction by 3D volume is 39 %. The left ventricle has  moderately decreased function. The left ventricle demonstrates global   hypokinesis. There is mild left  ventricular hypertrophy. Left ventricular diastolic parameters are  consistent with Grade I diastolic dysfunction (impaired relaxation).   2. Right ventricular systolic function is normal. The right ventricular  size is normal. Tricuspid regurgitation signal is inadequate for assessing  PA pressure.   3. The mitral valve is abnormal. Trivial mitral valve regurgitation.   4. The aortic valve is tricuspid. Aortic valve regurgitation is not  visualized. Aortic valve sclerosis is present, with no evidence of aortic  valve stenosis.   5. The inferior vena cava is normal in size with greater than 50%  respiratory variability, suggesting right atrial pressure of 3 mmHg.   Comparison(s): No prior Echocardiogram.   Carotid duplex 09/21/22 Summary:  Right Carotid: The extracranial vessels were near-normal with only minimal  wall                thickening or plaque.   Left Carotid: Velocities in the left ICA are consistent with a 1-39%  stenosis.   Vertebrals: Bilateral vertebral arteries demonstrate antegrade flow.  Subclavians: Normal flow hemodynamics were seen in bilateral subclavian               arteries. LHC 08/09/22   Ost LAD lesion is 15% stenosed.   Mid LAD lesion is 20% stenosed.   Dist Cx lesion is 90% stenosed.   Prox RCA lesion is 100% stenosed.   1st Mrg lesion is 40% stenosed.   1.  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   2.  RFR assessment of the first obtuse marginal was 0.92; medical management should be pursued. 3.  LVEDP of 10 mmHg.   Recommendation: Medical management.  EKG:  EKG is not ordered today.    Recent Labs: 12/19/2021: TSH 0.784 10/20/2022: ALT 27; BUN 22; Creatinine 0.91; Hemoglobin 14.3; Platelet Count 206; Potassium 4.3;  Sodium 138  Recent Lipid Panel    Component Value Date/Time   CHOL 153 12/12/2021 1534   TRIG 126 12/12/2021 1534   HDL 53 12/12/2021 1534   CHOLHDL 2.9 12/12/2021 1534   CHOLHDL 5.6 10/31/2007 0830   VLDL 26 10/31/2007 0830   LDLCALC 78 12/12/2021 1534   Home Medications   Current Meds  Medication Sig   Alirocumab (PRALUENT) 150 MG/ML SOAJ INJECT 1 DOSE INTO THE  SKIN EVERY 14 (FOURTEEN) DAYS.   aspirin EC 81 MG tablet Take 81 mg by mouth in the morning. Swallow whole.   clindamycin (CLEOCIN) 150 MG capsule Take 600 mg by mouth See admin instructions. Take 4 capsules (600 mg) by mouth prior to dental appointments   donepezil (ARICEPT) 10 MG tablet Take 10 mg by mouth in the morning.   ibuprofen (ADVIL) 200 MG tablet Take 600 mg by mouth every 6 (six) hours as needed (pain.).   Magnesium 500 MG TABS Take 500 mg by mouth in the morning.   methocarbamol (ROBAXIN) 500 MG tablet Take 1 tablet (500 mg total) by mouth every 6 (six) hours as needed for muscle spasms.   sertraline (ZOLOFT) 100 MG tablet Take 100 mg by mouth in the morning.   tamsulosin (FLOMAX) 0.4 MG CAPS capsule Take 0.4 mg by mouth in the morning.     Review of Systems      All other systems reviewed and are otherwise negative except as noted above.  Physical Exam    VS:  BP 102/62   Pulse 82   Ht 6\' 1"  (1.854 m)   Wt 199 lb (90.3 kg)   BMI 26.25 kg/m  , BMI Body mass index is 26.25 kg/m.  Wt Readings from Last 3 Encounters:  10/27/22 199 lb (90.3 kg)  10/24/22 201 lb 1.6 oz (91.2 kg)  09/26/22 198 lb (89.8 kg)     GEN: Well nourished, well developed, in no acute distress. HEENT: normal. Neck: Supple, no JVD, carotid bruits, or masses. Cardiac: RRR, no murmurs, rubs, or gallops. No clubbing, cyanosis, edema.  Radials/PT 2+ and equal bilaterally.  Respiratory:  Respirations regular and unlabored, clear to auscultation bilaterally. GI: Soft, nontender, nondistended. MS: No deformity or atrophy. Skin:  Warm and dry, no rash. Neuro:  Strength and sensation are intact. Psych: Normal affect.  Assessment & Plan    HFrEF - Echo 09/2022 LVEF 35-40% newly reduced. LHC 07/2022 stable CAD. Euvolemic and well compensated on exam. No indication for loop diuretic. Start Entresto 24-26mg  BID. One week sample and free 30 day card provided in clinic. BMP in 2 weeks. Low sodium diet, fluid restriction <2L, and daily weights encouraged. Educated to contact our office for weight gain of 2 lbs overnight or 5 lbs in one week. Discussed need to reduce intake of electrolyte substitutes.   CAD - LHC 07/2022 known CTO RCA colalteralized by left system with subtotally occluded distal LCx which subtends a small area recommended for medical management. GDMT aspirin, PCSK9i. Heart healthy diet and regular cardiovascular exercise encouraged.  Interested in being more active, refer to PREP exercise program.    HLD, LDL goal <70 - intolerant to Zetia, statin, feonfibrate with myalgias. Tolerates Praluent. Took last pen yesterday and notes will need refills but insurance prefers [place]. He gets his PCSK9i through [employer]. Will reach out to pharmacy for assistance in how to update the Rx and [place].   Carotid stenosis - Duplex 05/239 LICA 9-73% stenosed. Repeat duplex one year. Continue PCSK9i, aspirin.   Numbness - Noted in bilateral hands. Resolved since cervical fusion.    Disposition: Follow up  as scheduled  with Pixie Casino, MD or APP.  Signed, Loel Dubonnet, NP 10/27/2022, 3:03 PM [place]

## 2022-10-28 ENCOUNTER — Encounter (HOSPITAL_BASED_OUTPATIENT_CLINIC_OR_DEPARTMENT_OTHER): Payer: Self-pay | Admitting: Family

## 2022-10-30 ENCOUNTER — Other Ambulatory Visit (HOSPITAL_COMMUNITY): Payer: Self-pay

## 2022-10-30 ENCOUNTER — Telehealth: Payer: Self-pay | Admitting: Pharmacist Clinician (PhC)/ Clinical Pharmacy Specialist

## 2022-10-30 NOTE — Telephone Encounter (Signed)
Pharmacy Patient Advocate Encounter  Prior Authorization for REPATHA 140 MG/ML INJ has been approved.    Effective dates: 09/11/22 through 09/11/23   Received notification from [place] that prior authorization for REPATHA 140 MG/ML INJ is needed.    PA submitted on 10/30/22 [place] [id number] Status is pending  Karie Soda, [place] Patient Advocate Specialist Direct Number: [phone] Fax: [phone]

## 2022-10-30 NOTE — Telephone Encounter (Signed)
Insurance preference is for [place] in 2024.  Please start PA

## 2022-11-03 ENCOUNTER — Telehealth: Payer: Self-pay

## 2022-11-03 NOTE — Telephone Encounter (Signed)
Called to discuss PREP program, he would like to attend April class at [place], every T/Th 12-1:15; will contact in March to confirm start date and set up assessment visit.

## 2022-11-07 ENCOUNTER — Encounter (HOSPITAL_BASED_OUTPATIENT_CLINIC_OR_DEPARTMENT_OTHER): Payer: Self-pay

## 2022-11-07 NOTE — Telephone Encounter (Signed)
Hey I see you sent the prior auth request to the RX team, it looks like it was approved ok to send Rx?

## 2022-11-09 ENCOUNTER — Encounter (HOSPITAL_BASED_OUTPATIENT_CLINIC_OR_DEPARTMENT_OTHER): Payer: Self-pay

## 2022-11-09 MED ORDER — REPATHA SURECLICK 140 MG/ML ~~LOC~~ SOAJ: 140.0000 mg | SUBCUTANEOUS | 3 refills | Status: DC

## 2022-11-11 LAB — BASIC METABOLIC PANEL
BUN/Creatinine Ratio: 22 (ref 10–24)
BUN: 22 mg/dL (ref 8–27)
CO2: 22 mmol/L (ref 20–29)
Calcium: 9.4 mg/dL (ref 8.6–10.2)
Chloride: 98 mmol/L (ref 96–106)
Creatinine, Ser: 1 mg/dL (ref 0.76–1.27)
Glucose: 117 mg/dL — ABNORMAL HIGH (ref 70–99)
Potassium: 4.8 mmol/L (ref 3.5–5.2)
Sodium: 139 mmol/L (ref 134–144)
eGFR: 81 mL/min/{1.73_m2} (ref >59)

## 2022-11-15 ENCOUNTER — Other Ambulatory Visit: Payer: Self-pay | Admitting: *Deleted

## 2022-11-15 DIAGNOSIS — I5022 Chronic systolic (congestive) heart failure: Secondary | ICD-10-CM

## 2022-11-15 DIAGNOSIS — E785 Hyperlipidemia, unspecified: Secondary | ICD-10-CM

## 2022-12-05 ENCOUNTER — Telehealth: Payer: Self-pay

## 2022-12-05 NOTE — Telephone Encounter (Signed)
Called to confirm participation in [place] class on April 9, states he has MRI scheduled for low back pain next; he is symptomatic with pain and weakness in extremities;  discussed options, but needs to wait until MRI done and he has diagnosis; will tentatively schedule him to take April 29th class; he will call with any updates, if not I will contact him mid April to assess ability to attend PREP.

## 2022-12-13 ENCOUNTER — Telehealth: Payer: Self-pay | Admitting: Internal Medicine

## 2022-12-13 LAB — LIPID PANEL
Chol/HDL Ratio: 3.7 ratio (ref 0.0–5.0)
Cholesterol, Total: 151 mg/dL (ref 100–199)
HDL: 41 mg/dL (ref >39)
LDL Chol Calc (NIH): 92 mg/dL (ref 0–99)
Triglycerides: 99 mg/dL (ref 0–149)
VLDL Cholesterol Cal: 18 mg/dL (ref 5–40)

## 2022-12-13 NOTE — Telephone Encounter (Signed)
Faxed patient assistance application for Entresto to [employer] at [phone]

## 2022-12-18 NOTE — Telephone Encounter (Signed)
Patient approved to received Entresto from [employer] until 12/32/24

## 2022-12-19 ENCOUNTER — Encounter: Payer: Self-pay | Admitting: Neurological Surgery

## 2022-12-19 ENCOUNTER — Ambulatory Visit (HOSPITAL_BASED_OUTPATIENT_CLINIC_OR_DEPARTMENT_OTHER): Payer: Medicare Other | Admitting: Internal Medicine

## 2022-12-19 ENCOUNTER — Telehealth: Payer: Self-pay | Admitting: Internal Medicine

## 2022-12-19 DIAGNOSIS — M4316 Spondylolisthesis, lumbar region: Secondary | ICD-10-CM

## 2022-12-19 NOTE — Telephone Encounter (Signed)
Contacted patient to scheduled appointments. Patient is aware of appointments that are scheduled.   

## 2022-12-20 ENCOUNTER — Telehealth: Payer: Self-pay | Admitting: Neurology

## 2022-12-20 ENCOUNTER — Other Ambulatory Visit: Payer: Self-pay | Admitting: Neurological Surgery

## 2022-12-20 ENCOUNTER — Ambulatory Visit (INDEPENDENT_AMBULATORY_CARE_PROVIDER_SITE_OTHER): Payer: Medicare Other | Admitting: Neurology

## 2022-12-20 ENCOUNTER — Encounter: Payer: Self-pay | Admitting: Neurology

## 2022-12-20 VITALS — BP 131/57 | HR 71 | Ht 73.0 in | Wt 196.5 lb

## 2022-12-20 DIAGNOSIS — G3184 Mild cognitive impairment, so stated: Secondary | ICD-10-CM

## 2022-12-20 DIAGNOSIS — M4316 Spondylolisthesis, lumbar region: Secondary | ICD-10-CM

## 2022-12-20 MED ORDER — DONEPEZIL HCL 10 MG PO TABS: 10.0000 mg | ORAL_TABLET | Freq: Every morning | ORAL | 4 refills | Status: DC

## 2022-12-20 NOTE — Patient Instructions (Signed)
Continue current medication including Aricept 10 mg nightly Refer to formal neuropsychological testing Return in 1 year or sooner if worse.

## 2022-12-20 NOTE — Telephone Encounter (Signed)
Neuropsychology referral sent to [facility] (fax # [phone],  phone # [phone])

## 2022-12-20 NOTE — Progress Notes (Signed)
[facility]  PATIENT: [name] [name] DOB: 1951-04-24  REQUESTING CLINICIAN: Blair Heys, MD HISTORY FROM: Patient  REASON FOR VISIT: Memory deficits    HISTORICAL  CHIEF COMPLAINT:  Chief Complaint  Patient presents with   Follow-up    Rm 15, alone Memory loss, no questions or concerns reported Moca:28   INTERVAL HISTORY 12/20/2022:  Patient presents today for follow-up, last visit was a year ago.  Since then he has been doing well.  He reports taking the Aricept 10 mg nightly, denies any side effect from the medicine.  He still reports difficulty with memory, has to write things down, using a calendar or using his phone to remember.  He still independent but wife has also complained about his memory.  [place].  Currently he is going under a lot of stress because he is being worked up for recurrence of His cancer. He is interested in having additional testing such as neuropsychological testing or being screened for ADD.  HISTORY OF PRESENT ILLNESS:  This is a 72 year old gentleman with past medical history of chronic pain, depression, history of lung cancer, who is presenting with memory problem.  Patient describes memory problem as he cannot remember dates, having trouble remembering the year of his marriage.  He also reports that he is easily distracted, he will get up to go to a different room to pick up something but once he reaches the room, he will forget why he came here for the first. At time, he will forget what he is supposed to do.  This has been going on for the past couple years but worse in the past year.  He said that wife has complained about his memory.  He does have a family history of Alzheimer disease, aunt was diagnosed in her 55s and died in her 33s.  He is still independent, able to perform all activity of daily living.  Patient used to work for [employer],  he retired in 2020.  Prior to that he did a very detailed work, daily for 25 years.  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     TBI: No past history of TBI Stroke:   no past history of stroke Seizures:   no past history of seizures Sleep:   no history of sleep apnea.   Mood:  patient denies anxiety and depression  Functional status: independent in all ADLs and IADLs Patient lives with wife. Cooking: wife, will try to cook with reading instructions  Cleaning: patient  Shopping: will make a list if more than 3 things  Bathing: patient  Toileting: patient  Driving: Yes, no recent accident  Bills: No  Medications: Gabapentin, Flexeril, Opioids  Ever left the stove on by accident?: yes Forget how to use items around the house?: No  Getting lost going to familiar places?: No  Forgetting loved ones names?: No  Word finding difficulty? Yes  Sleep: Pretty good, will get about 8-10   OTHER MEDICAL CONDITIONS: Chronic pain, Depression, History of lung cancer (RLL removed, 4 sessions of chemo)    REVIEW OF SYSTEMS: Full 14 system review of systems performed and negative with exception of: as noted in the HPI   ALLERGIES: Allergies  Allergen Reactions   Statins Other (See Comments)    MYALGIAS   Chantix [Varenicline] Other (See Comments)    Ineffective; patient did not like the  way it made him feel   Diclofenac Sodium Other (See Comments)   Tricor [Fenofibrate] Other (See Comments)    ineffective for cholesterol (2014)   Zetia [Ezetimibe] Other (See Comments)    ineffective for cholesterol   Rosuvastatin Calcium Other (See Comments)    Muscle pain    HOME MEDICATIONS: Outpatient Medications Prior to Visit  Medication Sig Dispense Refill   aspirin EC 81 MG tablet Take 81 mg by mouth in the morning. Swallow whole.     clindamycin (CLEOCIN) 150 MG capsule Take 600 mg by mouth See admin instructions. Take 4 capsules (600 mg) by mouth prior to dental appointments      Evolocumab (REPATHA SURECLICK) 140 MG/ML SOAJ Inject 140 mg into the skin every 14 (fourteen) days. 6 mL 3   ibuprofen (ADVIL) 200 MG tablet Take 600 mg by mouth every 6 (six) hours as needed (pain.).     Magnesium 500 MG TABS Take 500 mg by mouth in the morning.     methocarbamol (ROBAXIN) 500 MG tablet Take 1 tablet (500 mg total) by mouth every 6 (six) hours as needed for muscle spasms. 30 tablet 1   sacubitril-valsartan (ENTRESTO) 24-26 MG Take 1 tablet by mouth 2 (two) times daily. 60 tablet 5   sertraline (ZOLOFT) 100 MG tablet Take 100 mg by mouth in the morning.     tamsulosin (FLOMAX) 0.4 MG CAPS capsule Take 0.4 mg by mouth in the morning.     donepezil (ARICEPT) 10 MG tablet Take 10 mg by mouth in the morning.     No facility-administered medications prior to visit.    PAST MEDICAL HISTORY: Past Medical History:  Diagnosis Date   Arthritis    Carpal tunnel syndrome    Right   Cataracts, bilateral    very mild   Coronary artery disease    Depression    Early cataracts, bilateral    Fatty liver    Gallstones    GERD (gastroesophageal reflux disease)    Headache 2011   HX OF VISUAL MIGRAINES - NO PAIN --MIGRAINES ATTRIBUTED TO PT CUTTING NICOTINE PATCH IN HALF BEFORE APPLYING TO HIS SKIN.     Hepatitis 2001   C -treated and no problems since  2001 'cured'   History of hiatal hernia    History of kidney stones    found on CT Scan   HOH (hard of hearing)    lung ca dx'd 12/06/19   RIGHT LUNG   Muscle cramps    hands and legs   Osteopenia    Pain    KNEE OA   Pre-diabetes    Right lower lobe lung mass    Shortness of breath dyspnea    "SOMETIMES FEELS LIKE I CAN'T CATCH A DEEP BREATH "- ON GOING FOR YEARS - STATES MEDICAL DOCTOR SENT HIM FOR CARDIAC WORK UP 2011 - PT STATES HEART OK   Stroke    HX POSSIBLE TIA - RT LEG NUMBNESS AND TINGLING FINGERS AND NUMBNESS RT FACE - ALL RESOLVED ; STATES NEUROLOGIST COULD NOT FIND ANY EVIDENCE OF STROKE - THOUGHT PT'S EPISODE  MIGHT BE FROM CUTTING NICOTINE PATCH IN HALF AND APPLYING TO SKIN   Trigger finger    LEFT MIDDLE FINGER   Wears glasses     PAST SURGICAL HISTORY: Past Surgical History:  Procedure Laterality Date   ANTERIOR CERVICAL DECOMP/DISCECTOMY FUSION N/A 09/26/2022   Procedure: Cervical four-five Anterior Cervical Decompression Fusion;  Surgeon: Barnett Abu, MD;  Location: [facility]  OR;  Service: Neurosurgery;  Laterality: N/A;   BRONCHIAL BIOPSY  02/24/2020   Procedure: BRONCHIAL BIOPSIES;  Surgeon: Leslye Peer, MD;  Location: [facility] ENDOSCOPY;  Service: Pulmonary;;   BRONCHIAL BRUSHINGS  02/24/2020   Procedure: BRONCHIAL BRUSHINGS;  Surgeon: Leslye Peer, MD;  Location: [facility] ENDOSCOPY;  Service: Pulmonary;;   BRONCHIAL NEEDLE ASPIRATION BIOPSY  02/24/2020   Procedure: BRONCHIAL NEEDLE ASPIRATION BIOPSIES;  Surgeon: Leslye Peer, MD;  Location: [facility] ENDOSCOPY;  Service: Pulmonary;;   BRONCHIAL WASHINGS  02/24/2020   Procedure: BRONCHIAL WASHINGS;  Surgeon: Leslye Peer, MD;  Location: [facility] ENDOSCOPY;  Service: Pulmonary;;   COLONOSCOPY     CORONARY PRESSURE/FFR STUDY N/A 08/09/2022   Procedure: INTRAVASCULAR PRESSURE WIRE/FFR STUDY;  Surgeon: Orbie Pyo, MD;  Location: [facility] INVASIVE CV LAB;  Service: Cardiovascular;  Laterality: N/A;   INTERCOSTAL NERVE BLOCK Right 04/28/2020   Procedure: INTERCOSTAL NERVE BLOCK;  Surgeon: Corliss Skains, MD;  Location: [facility] OR;  Service: Thoracic;  Laterality: Right;   left ankle surgery     fractured   LEFT HEART CATH AND CORONARY ANGIOGRAPHY N/A 06/29/2021   Procedure: LEFT HEART CATH AND CORONARY ANGIOGRAPHY;  Surgeon: Orbie Pyo, MD;  Location: [facility] INVASIVE CV LAB;  Service: Cardiovascular;  Laterality: N/A;   LEFT HEART CATH AND CORONARY ANGIOGRAPHY N/A 08/09/2022   Procedure: LEFT HEART CATH AND CORONARY ANGIOGRAPHY;  Surgeon: Orbie Pyo, MD;  Location: [facility] INVASIVE CV LAB;  Service: Cardiovascular;  Laterality: N/A;   left knee surgery      MENISCAL REMOVED   left shoulder     torn ligaments left shoulder   LUMBAR FUSION     L3 L4 L5   NODE DISSECTION Right 04/28/2020   Procedure: NODE DISSECTION;  Surgeon: Corliss Skains, MD;  Location: [facility] OR;  Service: Thoracic;  Laterality: Right;   RIGHT CARPAL TUNNEL RELEASE  2011   RIGHT KNEE ARTHROSCOPY   AUG OR SEPT 2015   AT [facility]   ROOT CANAL     ROTATOR CUFF REPAIR Left    2007   THORACOSCOPY Right 04/28/2020   XI ROBOTIC ASSISTED THORASCOPY-LOBECTOMY (Right)   TONSILLECTOMY     as child   TOTAL KNEE ARTHROPLASTY Left 07/14/2013   Procedure: LEFT TOTAL KNEE ARTHROPLASTY;  Surgeon: Shelda Pal, MD;  Location: [facility] ORS;  Service: Orthopedics;  Laterality: Left;   TOTAL KNEE ARTHROPLASTY Right 07/21/2014   Procedure: RIGHT TOTAL KNEE ARTHROPLASTY;  Surgeon: Shelda Pal, MD;  Location: [facility] ORS;  Service: Orthopedics;  Laterality: Right;   TOTAL SHOULDER ARTHROPLASTY Right 10/17/2019   Procedure: RIGHT REVERSE TOTAL SHOULDER REPLACEMENT;  Surgeon: Beverely Low, MD;  Location: [facility] ORS;  Service: Orthopedics;  Laterality: Right;  interscalene block   TRIGGER FINGER RELEASE Left    VIDEO BRONCHOSCOPY WITH ENDOBRONCHIAL NAVIGATION Right 02/24/2020   Procedure: VIDEO BRONCHOSCOPY WITH ENDOBRONCHIAL NAVIGATION;  Surgeon: Leslye Peer, MD;  Location: [facility] ENDOSCOPY;  Service: Pulmonary;  Laterality: Right;   WEDGE RESECTION Right 04/28/2020   XI ROBOTIC ASSISTED THORASCOPY-RIGHT LOWER LOBE WEDGE RESECTION (Right Chest)     FAMILY HISTORY: Family History  Problem Relation Age of Onset   Alcohol abuse Father    Hyperlipidemia Father    Heart attack Brother 28   Hyperlipidemia Brother    Hyperlipidemia Sister        3 sisters   Heart attack Brother    Hyperlipidemia Brother     SOCIAL HISTORY: Social History  Socioeconomic History   Marital status: Married    Spouse name: Not on file   Number of children: Not on file   Years of education:  Not on file   Highest education level: Not on file  Occupational History   Not on file  Tobacco Use   Smoking status: Former    Types: Cigarettes    Quit date: 04/30/2012    Years since quitting: 10.6   Smokeless tobacco: Never  Vaping Use   Vaping Use: Former  Substance and Sexual Activity   Alcohol use: Not Currently    Alcohol/week: 7.0 standard drinks of alcohol    Types: 7 Shots of liquor per week   Drug use: Not Currently    Types: Marijuana    Comment: ocassionally, last time smoked 07/2017   Sexual activity: Not on file  Other Topics Concern   Not on file  Social History Narrative   Not on file   Social Determinants of Health   Financial Resource Strain: Not on file  Food Insecurity: Not on file  Transportation Needs: Not on file  Physical Activity: Not on file  Stress: Not on file  Social Connections: Not on file  Intimate Partner Violence: Not on file    PHYSICAL EXAM  GENERAL EXAM/CONSTITUTIONAL: Vitals:  Vitals:   12/20/22 1307  BP: (!) 131/57  Pulse: 71  Weight: 196 lb 8 oz (89.1 kg)  Height: 6\' 1"  (1.854 m)   Body mass index is 25.93 kg/m. Wt Readings from Last 3 Encounters:  12/20/22 196 lb 8 oz (89.1 kg)  10/27/22 199 lb (90.3 kg)  10/24/22 201 lb 1.6 oz (91.2 kg)   Patient is in no distress; well developed, nourished and groomed; neck is supple  EYES: Visual fields full to confrontation, Extraocular movements intacts,   MUSCULOSKELETAL: Gait, strength, tone, movements noted in Neurologic exam below  NEUROLOGIC: MENTAL STATUS:      No data to display         awake, alert, oriented to person, place and time recent and remote memory intact normal attention and concentration language fluent, comprehension intact, naming intact fund of knowledge appropriate    12/20/2022    1:07 PM 12/19/2021    1:13 PM  Montreal Cognitive Assessment   Visuospatial/ Executive (0/5) 5 5  Naming (0/3) 3 3  Attention: Read list of digits (0/2) 2  2  Attention: Read list of letters (0/1) 1 1  Attention: Serial 7 subtraction starting at 100 (0/3) 3 3  Language: Repeat phrase (0/2) 2 2  Language : Fluency (0/1) 1 1  Abstraction (0/2) 2 2  Delayed Recall (0/5) 4 3  Orientation (0/6) 5 6  Total 28 28  Adjusted Score (based on education)  28    CRANIAL NERVE:  2nd, 3rd, 4th, 6th -visual fields full to confrontation, extraocular muscles intact, no nystagmus 5th - facial sensation symmetric 7th - facial strength symmetric 8th - hearing intact 9th - palate elevates symmetrically, uvula midline 11th - shoulder shrug symmetric 12th - tongue protrusion midline  MOTOR:  normal bulk and tone, full strength in the BUE, BLE  SENSORY:  normal and symmetric to light touch, vibration  COORDINATION:  finger-nose-finger, fine finger movements normal  REFLEXES:  deep tendon reflexes present and symmetric  GAIT/STATION:  normal   DIAGNOSTIC DATA (LABS, IMAGING, TESTING) - I reviewed patient records, labs, notes, testing and imaging myself where available.  Lab Results  Component Value Date   WBC 8.4 10/20/2022  HGB 14.3 10/20/2022   HCT 42.4 10/20/2022   MCV 88.9 10/20/2022   PLT 206 10/20/2022      Component Value Date/Time   NA 139 11/10/2022 1259   K 4.8 11/10/2022 1259   CL 98 11/10/2022 1259   CO2 22 11/10/2022 1259   GLUCOSE 117 (H) 11/10/2022 1259   GLUCOSE 120 (H) 10/20/2022 1359   BUN 22 11/10/2022 1259   CREATININE 1.00 11/10/2022 1259   CREATININE 0.91 10/20/2022 1359   CALCIUM 9.4 11/10/2022 1259   PROT 7.0 10/20/2022 1359   PROT 6.7 08/07/2022 0903   ALBUMIN 4.1 10/20/2022 1359   ALBUMIN 4.6 08/07/2022 0903   AST 25 10/20/2022 1359   ALT 27 10/20/2022 1359   ALKPHOS 80 10/20/2022 1359   BILITOT 0.6 10/20/2022 1359   GFRNONAA >60 10/20/2022 1359   GFRAA >60 06/14/2020 1436   Lab Results  Component Value Date   CHOL 151 12/12/2022   HDL 41 12/12/2022   LDLCALC 92 12/12/2022   TRIG 99 12/12/2022    CHOLHDL 3.7 12/12/2022   Lab Results  Component Value Date   HGBA1C 5.8 (H) 09/08/2022   Lab Results  Component Value Date   VITAMINB12 1,137 08/28/2022   Lab Results  Component Value Date   TSH 0.784 12/19/2021    MRI Brain 05/25/2020 No acute infarction, hemorrhage, hydrocephalus, extra-axial collection or mass lesion. Few remote white matter insults, nonspecific. Normal brain volume    ASSESSMENT AND PLAN  72 y.o. year old male with chronic pain, depression, history of lung cancer, who is presenting for follow up for his mild cognitive impairment.  He is on Aricept 10 mg nightly, still report difficulty with memory, difficulty with remembering, misplacing items and getting lost.  Will refer him for formal neuropsychological testing to get a baseline. I will see the patient in 1 year for follow-up.   1. Mild cognitive impairment      Patient Instructions  Continue current medication including Aricept 10 mg nightly Refer to formal neuropsychological testing Return in 1 year or sooner if worse.  Orders Placed This Encounter  Procedures   Ambulatory referral to Neuropsychology    Meds ordered this encounter  Medications   donepezil (ARICEPT) 10 MG tablet    Sig: Take 1 tablet (10 mg total) by mouth in the morning.    Dispense:  90 tablet    Refill:  4    Return in about 1 year (around 12/20/2023).  I have spent a total of 40 minutes dedicated to this patient today, preparing to see patient, performing a medically appropriate examination and evaluation, ordering tests and/or medications and procedures, and counseling and educating the patient/family/caregiver; independently interpreting result and communicating results to the family/patient/caregiver; and documenting clinical information in the electronic medical record.  Windell Norfolk, MD 12/20/2022, 1:50 PM  [facility] [address] [place], [place] [address] [phone]

## 2022-12-21 ENCOUNTER — Ambulatory Visit
Admission: RE | Admit: 2022-12-21 | Discharge: 2022-12-21 | Disposition: A | Discharge status: Home / Self Care | Payer: Medicare Other | Source: Ambulatory Visit | Attending: Neurological Surgery | Admitting: Neurological Surgery

## 2022-12-21 ENCOUNTER — Inpatient Hospital Stay: Payer: Medicare Other | Attending: Internal Medicine | Admitting: Internal Medicine

## 2022-12-21 ENCOUNTER — Inpatient Hospital Stay: Payer: Medicare Other

## 2022-12-21 VITALS — BP 115/71 | HR 93 | Temp 98.2°F | Resp 15 | Wt 196.8 lb

## 2022-12-21 DIAGNOSIS — C3490 Malignant neoplasm of unspecified part of unspecified bronchus or lung: Secondary | ICD-10-CM

## 2022-12-21 DIAGNOSIS — M899 Disorder of bone, unspecified: Secondary | ICD-10-CM | POA: Diagnosis not present

## 2022-12-21 DIAGNOSIS — Z85118 Personal history of other malignant neoplasm of bronchus and lung: Secondary | ICD-10-CM | POA: Diagnosis present

## 2022-12-21 DIAGNOSIS — M4316 Spondylolisthesis, lumbar region: Secondary | ICD-10-CM

## 2022-12-21 DIAGNOSIS — C3491 Malignant neoplasm of unspecified part of right bronchus or lung: Secondary | ICD-10-CM | POA: Diagnosis not present

## 2022-12-21 NOTE — Progress Notes (Signed)
[facility] [phone]   [phone]  OFFICE PROGRESS NOTE  Blair Heys, MD 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   PRIOR THERAPY:   1) Status post right lower lobectomy with lymph node dissection under the care of Dr. Cliffton Asters on 04/28/2020. 2)  Adjuvant systemic chemotherapy with cisplatin 75 mg/m2 and Alimta 500 mg/mm IV every 3 weeks.  First dose expected on 06/16/2020.  Status post 4 cycles.   CURRENT THERAPY:  Observation.  INTERVAL HISTORY: [name] [name] 72 y.o. male referred to the clinic today for follow-up visit accompanied by his wife.  The patient has been complaining of low back pain and he was seen by Dr. Danielle Dess with neurosurgery.  He had imaging studies in their office that showed suspicious lesion at the L3.  The patient had CT of the lumbar spine performed earlier today and he is here for evaluation and discussion of his scan results.  He also had CT scan of the chest performed in February 2024 that was unremarkable for any disease recurrence or metastasis.  He denied having any current chest pain, shortness of breath, cough or hemoptysis.  He has no nausea, vomiting, diarrhea or constipation.  He has no headache or visual changes.  He denied having any recent weight loss or night sweats.  He is here today for evaluation and recommendation regarding his condition.    MEDICAL HISTORY: Past Medical History:  Diagnosis Date   Arthritis    Carpal tunnel syndrome    Right   Cataracts, bilateral    very mild   Coronary artery disease    Depression    Early cataracts, bilateral    Fatty liver    Gallstones    GERD (gastroesophageal reflux disease)    Headache 2011   HX OF VISUAL MIGRAINES - NO PAIN --MIGRAINES ATTRIBUTED TO PT CUTTING NICOTINE PATCH  IN HALF BEFORE APPLYING TO HIS SKIN.     Hepatitis 2001   C -treated and no problems since  2001 'cured'   History of hiatal hernia    History of kidney stones    found on CT Scan   HOH (hard of hearing)    lung ca dx'd 12/06/19   RIGHT LUNG   Muscle cramps    hands and legs   Osteopenia    Pain    KNEE OA   Pre-diabetes    Right lower lobe lung mass    Shortness of breath dyspnea    "SOMETIMES FEELS LIKE I CAN'T CATCH A DEEP BREATH "- ON GOING FOR YEARS - STATES MEDICAL DOCTOR SENT HIM FOR CARDIAC WORK UP 2011 - PT STATES HEART OK   Stroke    HX POSSIBLE TIA - RT LEG NUMBNESS AND TINGLING FINGERS AND NUMBNESS RT FACE - ALL RESOLVED ; STATES NEUROLOGIST COULD NOT FIND ANY EVIDENCE OF STROKE - THOUGHT PT'S EPISODE MIGHT BE FROM CUTTING NICOTINE PATCH IN HALF AND APPLYING TO SKIN   Trigger finger    LEFT MIDDLE FINGER   Wears glasses     ALLERGIES:  is allergic to statins, chantix [varenicline], diclofenac sodium, tricor [fenofibrate], zetia [ezetimibe], and rosuvastatin calcium.  MEDICATIONS:  Current Outpatient Medications  Medication Sig Dispense Refill   aspirin EC 81 MG tablet Take 81 mg by mouth in the morning. Swallow  whole.     clindamycin (CLEOCIN) 150 MG capsule Take 600 mg by mouth See admin instructions. Take 4 capsules (600 mg) by mouth prior to dental appointments     donepezil (ARICEPT) 10 MG tablet Take 1 tablet (10 mg total) by mouth in the morning. 90 tablet 4   Evolocumab (REPATHA SURECLICK) 140 MG/ML SOAJ Inject 140 mg into the skin every 14 (fourteen) days. 6 mL 3   ibuprofen (ADVIL) 200 MG tablet Take 600 mg by mouth every 6 (six) hours as needed (pain.).     Magnesium 500 MG TABS Take 500 mg by mouth in the morning.     methocarbamol (ROBAXIN) 500 MG tablet Take 1 tablet (500 mg total) by mouth every 6 (six) hours as needed for muscle spasms. 30 tablet 1   sacubitril-valsartan (ENTRESTO) 24-26 MG Take 1 tablet by mouth 2 (two) times daily. 60 tablet 5    sertraline (ZOLOFT) 100 MG tablet Take 100 mg by mouth in the morning.     tamsulosin (FLOMAX) 0.4 MG CAPS capsule Take 0.4 mg by mouth in the morning.     No current facility-administered medications for this visit.    SURGICAL HISTORY:  Past Surgical History:  Procedure Laterality Date   ANTERIOR CERVICAL DECOMP/DISCECTOMY FUSION N/A 09/26/2022   Procedure: Cervical four-five Anterior Cervical Decompression Fusion;  Surgeon: Barnett Abu, MD;  Location: [facility] OR;  Service: Neurosurgery;  Laterality: N/A;   BRONCHIAL BIOPSY  02/24/2020   Procedure: BRONCHIAL BIOPSIES;  Surgeon: Leslye Peer, MD;  Location: [facility] ENDOSCOPY;  Service: Pulmonary;;   BRONCHIAL BRUSHINGS  02/24/2020   Procedure: BRONCHIAL BRUSHINGS;  Surgeon: Leslye Peer, MD;  Location: [facility] ENDOSCOPY;  Service: Pulmonary;;   BRONCHIAL NEEDLE ASPIRATION BIOPSY  02/24/2020   Procedure: BRONCHIAL NEEDLE ASPIRATION BIOPSIES;  Surgeon: Leslye Peer, MD;  Location: [facility] ENDOSCOPY;  Service: Pulmonary;;   BRONCHIAL WASHINGS  02/24/2020   Procedure: BRONCHIAL WASHINGS;  Surgeon: Leslye Peer, MD;  Location: [facility] ENDOSCOPY;  Service: Pulmonary;;   COLONOSCOPY     CORONARY PRESSURE/FFR STUDY N/A 08/09/2022   Procedure: INTRAVASCULAR PRESSURE WIRE/FFR STUDY;  Surgeon: Orbie Pyo, MD;  Location: [facility] INVASIVE CV LAB;  Service: Cardiovascular;  Laterality: N/A;   INTERCOSTAL NERVE BLOCK Right 04/28/2020   Procedure: INTERCOSTAL NERVE BLOCK;  Surgeon: Corliss Skains, MD;  Location: [facility] OR;  Service: Thoracic;  Laterality: Right;   left ankle surgery     fractured   LEFT HEART CATH AND CORONARY ANGIOGRAPHY N/A 06/29/2021   Procedure: LEFT HEART CATH AND CORONARY ANGIOGRAPHY;  Surgeon: Orbie Pyo, MD;  Location: [facility] INVASIVE CV LAB;  Service: Cardiovascular;  Laterality: N/A;   LEFT HEART CATH AND CORONARY ANGIOGRAPHY N/A 08/09/2022   Procedure: LEFT HEART CATH AND CORONARY ANGIOGRAPHY;  Surgeon: Orbie Pyo, MD;   Location: [facility] INVASIVE CV LAB;  Service: Cardiovascular;  Laterality: N/A;   left knee surgery     MENISCAL REMOVED   left shoulder     torn ligaments left shoulder   LUMBAR FUSION     L3 L4 L5   NODE DISSECTION Right 04/28/2020   Procedure: NODE DISSECTION;  Surgeon: Corliss Skains, MD;  Location: [facility] OR;  Service: Thoracic;  Laterality: Right;   RIGHT CARPAL TUNNEL RELEASE  2011   RIGHT KNEE ARTHROSCOPY   AUG OR SEPT 2015   AT [facility]   ROOT CANAL     ROTATOR CUFF REPAIR Left    2007  THORACOSCOPY Right 04/28/2020   XI ROBOTIC ASSISTED THORASCOPY-LOBECTOMY (Right)   TONSILLECTOMY     as child   TOTAL KNEE ARTHROPLASTY Left 07/14/2013   Procedure: LEFT TOTAL KNEE ARTHROPLASTY;  Surgeon: Shelda Pal, MD;  Location: [facility] ORS;  Service: Orthopedics;  Laterality: Left;   TOTAL KNEE ARTHROPLASTY Right 07/21/2014   Procedure: RIGHT TOTAL KNEE ARTHROPLASTY;  Surgeon: Shelda Pal, MD;  Location: [facility] ORS;  Service: Orthopedics;  Laterality: Right;   TOTAL SHOULDER ARTHROPLASTY Right 10/17/2019   Procedure: RIGHT REVERSE TOTAL SHOULDER REPLACEMENT;  Surgeon: Beverely Low, MD;  Location: [facility] ORS;  Service: Orthopedics;  Laterality: Right;  interscalene block   TRIGGER FINGER RELEASE Left    VIDEO BRONCHOSCOPY WITH ENDOBRONCHIAL NAVIGATION Right 02/24/2020   Procedure: VIDEO BRONCHOSCOPY WITH ENDOBRONCHIAL NAVIGATION;  Surgeon: Leslye Peer, MD;  Location: [facility] ENDOSCOPY;  Service: Pulmonary;  Laterality: Right;   WEDGE RESECTION Right 04/28/2020   XI ROBOTIC ASSISTED THORASCOPY-RIGHT LOWER LOBE WEDGE RESECTION (Right Chest)     REVIEW OF SYSTEMS:  Constitutional: positive for fatigue Eyes: negative Ears, nose, mouth, throat, and face: negative Respiratory: negative Cardiovascular: negative Gastrointestinal: negative Genitourinary:negative Integument/breast: negative Hematologic/lymphatic: negative Musculoskeletal:positive for back pain Neurological:  negative Behavioral/Psych: negative Endocrine: negative Allergic/Immunologic: negative   PHYSICAL EXAMINATION: General appearance: alert, cooperative, fatigued, and no distress Head: Normocephalic, without obvious abnormality, atraumatic Neck: no adenopathy, no JVD, supple, symmetrical, trachea midline, and thyroid not enlarged, symmetric, no tenderness/mass/nodules Lymph nodes: Cervical, supraclavicular, and axillary nodes normal. Resp: clear to auscultation bilaterally Back: symmetric, no curvature. ROM normal. No CVA tenderness. Cardio: regular rate and rhythm, S1, S2 normal, no murmur, click, rub or gallop GI: soft, non-tender; bowel sounds normal; no masses,  no organomegaly Extremities: extremities normal, atraumatic, no cyanosis or edema Neurologic: Alert and oriented X 3, normal strength and tone. Normal symmetric reflexes. Normal coordination and gait  ECOG PERFORMANCE STATUS: 1 - Symptomatic but completely ambulatory   Blood pressure 115/71, pulse 93, temperature 98.2 F (36.8 C), temperature source Oral, resp. rate 15, weight 196 lb 12.8 oz (89.3 kg), SpO2 95 %.  LABORATORY DATA: Lab Results  Component Value Date   WBC 8.4 10/20/2022   HGB 14.3 10/20/2022   HCT 42.4 10/20/2022   MCV 88.9 10/20/2022   PLT 206 10/20/2022      Chemistry      Component Value Date/Time   NA 139 11/10/2022 1259   K 4.8 11/10/2022 1259   CL 98 11/10/2022 1259   CO2 22 11/10/2022 1259   BUN 22 11/10/2022 1259   CREATININE 1.00 11/10/2022 1259   CREATININE 0.91 10/20/2022 1359      Component Value Date/Time   CALCIUM 9.4 11/10/2022 1259   ALKPHOS 80 10/20/2022 1359   AST 25 10/20/2022 1359   ALT 27 10/20/2022 1359   BILITOT 0.6 10/20/2022 1359       RADIOGRAPHIC STUDIES: CT LUMBAR SPINE WO CONTRAST  Result Date: 12/21/2022 CLINICAL DATA:  Spondylolisthesis of lumbar spine EXAM: CT LUMBAR SPINE WITHOUT CONTRAST TECHNIQUE: Multidetector CT imaging of the lumbar spine was  performed without intravenous contrast administration. Multiplanar CT image reconstructions were also generated. RADIATION DOSE REDUCTION: This exam was performed according to the departmental dose-optimization program which includes automated exposure control, adjustment of the mA and/or kV according to patient size and/or use of iterative reconstruction technique. COMPARISON:  Lumbar MRI 12/12/2022 FINDINGS: Segmentation: Transitional S1 vertebra as previously numbered. Alignment: Physiologic Vertebrae: L3-L5 PLIF with solid fusion. Pedicle screws are present at each  level. There are bubbly lucent lesions that have been acquired based on prior imaging associated with the left L3 pedicle screw and both L5 pedicle screws. No fracture or loosening. No bone lesion seen elsewhere Paraspinal and other soft tissues: Expected postoperative scarring in the posterior soft tissues. Disc levels: T12- L1: Disc height loss with mild endplate ridging. L1-L2: Mild disc narrowing and facet spurring. L2-L3: Disc narrowing and bulging. Moderate degenerative facet spurring. Mild or moderate spinal stenosis better assessed on prior MRI L3-L4: No bony impinge L4-L5: No bony impingement L5-S1:Disc narrowing and bulging. Degenerative facet spurring on both sides. At least moderate spinal stenosis. IMPRESSION: Bubbly lytic lesions at L3 and L5 bodies which are centered on the left L3 and bilateral L5 pedicle screws. This pattern suggests findings are reactive to the pedicle screws, although arthrodesis is solid band the appearance atypical. Need close follow-up based on history of lung cancer, consider systemic metastatic workup. Based on the lesion morphology alone, multiple myeloma can have a similar appearance and directed lab analysis is also suggested. Adjacent segment degeneration as seen by recent MRI. Electronically Signed   By: Tiburcio PeaJonathan  Watts M.D.   On: 12/21/2022 09:32     ASSESSMENT AND PLAN: This is a very pleasant 72  years old white male with a stage IIIa non-small cell lung cancer, mucinous adenocarcinoma status post right lower lobectomy with lymph node dissection in August 2021. The patient underwent adjuvant systemic chemotherapy with cisplatin and Alimta status post 4 cycles. The patient tolerated the previous 4 cycles of his treatment well with no concerning adverse effect except for fatigue. The patient has been on observation since that time and he is feeling fine with no concerning complaints except for the recent low back pain.  He was found to have a suspicious lesion at the L3 by his neurosurgeon. The patient had CT of the lumbar spine performed earlier today.  I personally and independently reviewed the imaging studies with the patient and his wife.  The scan showed bubbly lytic lesion at L3 and L5 bodies centered on the left L3 and bilateral L5 pedicle screws and this pattern are suggestive of reactive process to the pedicle screws but of course with his history of lung cancer metastatic disease or multiple myeloma could not be completely excluded. I recommended for the patient to have his myeloma panel performed today to rule out the possibility of multiple myeloma. I recommended for the patient to continue his routine follow-up visit and evaluation by Dr. Hilda LiasElsinore and if he is planning for spine fusion, he may consider biopsy of 1 of this lesion for confirmation of tissue diagnosis. I will see him back for follow-up visit in 6 months for evaluation with repeat CT scan of the chest for restaging of his disease. The patient was advised to call immediately if he has any concerning symptoms in the interval. The patient voices understanding of current disease status and treatment options and is in agreement with the current care plan.  All questions were answered. The patient knows to call the clinic with any problems, questions or concerns. We can certainly see the patient much sooner if  necessary.  Disclaimer: This note was dictated with voice recognition software. Similar sounding words can inadvertently be transcribed and may not be corrected upon review.

## 2022-12-22 LAB — BETA 2 MICROGLOBULIN, SERUM: Beta-2 Microglobulin: 1.7 mg/L (ref 0.6–2.4)

## 2022-12-22 LAB — KAPPA/LAMBDA LIGHT CHAINS
Kappa free light chain: 34.6 mg/L — ABNORMAL HIGH (ref 3.3–19.4)
Kappa, lambda light chain ratio: 1.95 — ABNORMAL HIGH (ref 0.26–1.65)
Lambda free light chains: 17.7 mg/L (ref 5.7–26.3)

## 2022-12-23 LAB — IGG, IGA, IGM
IgA: 273 mg/dL (ref 61–437)
IgG (Immunoglobin G), Serum: 758 mg/dL (ref 603–1613)
IgM (Immunoglobulin M), Srm: 53 mg/dL (ref 20–172)

## 2022-12-25 ENCOUNTER — Telehealth: Payer: Self-pay

## 2022-12-25 ENCOUNTER — Encounter: Payer: Self-pay | Admitting: Internal Medicine

## 2022-12-25 NOTE — Telephone Encounter (Signed)
Called to discuss possible participation in next PREP class on April 29; he is being worked up for lesion on his spine so will hold off starting until he is cleared/knows outcome of this lesion. Will contact first of May to see if he is able to talk May 14 class.

## 2022-12-27 ENCOUNTER — Telehealth: Payer: Self-pay | Admitting: Medical Oncology

## 2022-12-27 NOTE — Telephone Encounter (Signed)
Pt notified " I am sure it will be okay for him to wait until Dr. Danielle Dess is available.  Thank you ".

## 2023-01-02 ENCOUNTER — Encounter: Payer: Self-pay | Admitting: Internal Medicine

## 2023-01-02 ENCOUNTER — Ambulatory Visit: Payer: Medicare Other | Attending: Internal Medicine | Admitting: Internal Medicine

## 2023-01-02 VITALS — BP 110/70 | HR 67 | Ht 73.0 in | Wt 197.0 lb

## 2023-01-02 DIAGNOSIS — M791 Myalgia, unspecified site: Secondary | ICD-10-CM

## 2023-01-02 DIAGNOSIS — I5022 Chronic systolic (congestive) heart failure: Secondary | ICD-10-CM | POA: Insufficient documentation

## 2023-01-02 DIAGNOSIS — R0602 Shortness of breath: Secondary | ICD-10-CM | POA: Diagnosis not present

## 2023-01-02 DIAGNOSIS — I2510 Atherosclerotic heart disease of native coronary artery without angina pectoris: Secondary | ICD-10-CM | POA: Insufficient documentation

## 2023-01-02 DIAGNOSIS — T466X5A Adverse effect of antihyperlipidemic and antiarteriosclerotic drugs, initial encounter: Secondary | ICD-10-CM | POA: Insufficient documentation

## 2023-01-02 DIAGNOSIS — T466X5D Adverse effect of antihyperlipidemic and antiarteriosclerotic drugs, subsequent encounter: Secondary | ICD-10-CM

## 2023-01-02 MED ORDER — EMPAGLIFLOZIN 10 MG PO TABS: 10.0000 mg | ORAL_TABLET | Freq: Every day | ORAL | 3 refills | Status: DC

## 2023-01-02 MED ORDER — EMPAGLIFLOZIN 10 MG PO TABS: 10.0000 mg | ORAL_TABLET | Freq: Every day | ORAL | 0 refills | Status: DC

## 2023-01-02 NOTE — Patient Instructions (Signed)
Medication Instructions:  START Jardiance 10 mg daily  *If you need a refill on your cardiac medications before your next appointment, please call your pharmacy*  Follow-Up: At [facility], you and your health needs are our priority.  As part of our continuing mission to provide you with exceptional heart care, we have created designated Provider Care Teams.  These Care Teams include your primary Cardiologist (physician) and Advanced Practice Providers (APPs -  Physician Assistants and Nurse Practitioners) who all work together to provide you with the care you need, when you need it.  We recommend signing up for the patient portal called "MyChart".  Sign up information is provided on this After Visit Summary.  MyChart is used to connect with patients for Virtual Visits (Telemedicine).  Patients are able to view lab/test results, encounter notes, upcoming appointments, etc.  Non-urgent messages can be sent to your provider as well.   To learn more about what you can do with MyChart, go to [email or web address].    Your next appointment:   3 month(s)  Provider:   Chrystie Nose, MD

## 2023-01-02 NOTE — Progress Notes (Signed)
LIPID CLINIC CONSULT NOTE  Chief Complaint:  Fatigue, shortness of breath  Primary Care Physician: Blair Heys, MD  Primary Cardiologist:  Chrystie Nose, MD  HPI:  Mataeo Ingwersen is a 72 y.o. male who is being seen today for the evaluation of dyslipidemia, statin intolerance at the request of Blair Heys, MD.  This is a pleasant 72 year old male kindly referred for management of dyslipidemia with a history of statin intolerance (this included atorvastatin, ezetimibe, pravastatin, fenofibrate and pitavastatin).  He reports longstanding dyslipidemia and has been tried on both multiple statins, ezetimibe and fenofibrate in the past causing myalgias which were intolerable.  More recently had lipids off of medication and total cholesterol was 267, triglycerides 166, HDL 53 and LDL 183.  Non-HDL cholesterol 214.  There is a strong family history of heart disease. He related that all 5 siblings (3 girls, 2 boys) take cholesterol medicine and both brothers had heart attacks. Father's cholesterol was very high, difficult to keep below 300 (per patient report).   06/11/2020  Allante returns today for follow-up. Unfortunately he is CT calcium score was abnormal showing an elevated score of 1222, 93rd percentile for age and sex matched control. He denies any cardiac symptoms however was found on extracardiac imaging to have a macrolobulated mass in the right lower lobe concerning for primary bronchogenic neoplasm. Subsequently he underwent work-up and this was found to be hypermetabolic. 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 Fortunately no evidence of metastases and he is planning to undergo neoadjuvant therapy. He was able to get through the procedure well without any issues therefore I do not think that he has any significant obstructive coronary disease. He has responded well to Praluent with a marked reduction in cholesterol. Total  is now 143, triglycerides 103, HDL 37 and LDL of 87. For the time being we will continue this. He did get a grant from [employer] and I informed him that it may be that that is no longer active as of next summer. That foundation is not currently providing grants. We will have to seek out other all options possibly next year.  06/13/2021  Vernell returns today for follow-up.  Overall he says he is doing well.  He seems to now be in remission from the lung cancer which was picked up incidentally with calcium scoring.  Although he seems to be doing well from that and is well removed from chemotherapy, he reports he has had progressive fatigue and shortness of breath as well as some chest discomfort.  As noted he had a very high calcium score which was 1222.  He did not seem necessarily symptomatic at this time therefore we did not pursue additional testing however now may be having more symptoms that could be coronary.  06/24/2021  Deshone is seen today in follow-up.  He recently underwent CT coronary angiography for progressive fatigue and chest pressure.  He told me the other day he played pool for about an hour and a half and was completely exhausted and had chest pressure after it.  He said he took 3 full-strength aspirin with some relief in his symptoms.  Coronary CT showed a high calcium score of 1300, 91st percentile.  He was noted to have subtotal occlusion of the RCA (CAD RADS 5) and disease in the LAD and circumflex.  A small PFO was noted.  He was referred for FFR.  FFR showed positive for  reduced flow in the proximal RCA however the LAD and circumflex were not noted to be hemodynamically significant.  Cardiac catheterization has been recommended.  I shared the results with him today and he is in agreement.  Would like him to start aspirin 81 mg daily and will provide sublingual nitro as needed.  01/10/2022  Sheldon returns today for follow-up.  He seems to be doing well.  He denies any anginal symptoms  or worsening shortness of breath and fatigue.  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  He has been on aggressive medical therapy for this.  I had started him on Praluent.  His cholesterol is lower with total now 153 down from 186 and LDL of 78 (down from 116).  He does not take a daily aspirin.  He said been having issues with his left shoulder which apparently has multiple rotator cuff muscle tears and he says he may need eventually to have shoulder replacement.  He is currently on a narcotic (Vicodin), 4 times daily.  07/28/2022  Daden returns today for follow-up.  He requested a visit today because of progressive fatigue, left chest ache and shortness of breath.  He was seen back in August by Robin Searing, NP for preoperative risk assessment.  He is planning reverse left shoulder arthroplasty.  This procedure however was not performed and has been delayed until January due to some personal issues.  However, over the past several months, he has had worsening symptoms.  He is significantly more fatigued and short of breath and experiencing left-sided chest discomfort.  Of note, his last heart catheterization was in October 2022 which showed mild OM and LAD disease as well as high-grade disease of the distal left circumflex however this was considered a small vessel.  The right coronary was occluded with right to right and septal collaterals.  He is continue to follow with oncology regarding his lung cancer which is in remission without evidence of recurrent metastatic disease by CT in August.  Hemoglobin was 14.3 in August as well.  01/02/2023  Bon returns today for follow-up of shortness of breath.  In the interim he had seen Gillian Shields in February for shortness of breath and had an echocardiogram which showed reduction in LVEF down to 35 to 40%.  The etiology for this is  unknown however although he does have coronary artery disease it seems that this is more likely to be nonischemic.  He had had a repeat catheterization in the fall as well that showed stable coronary anatomy without any blockages.  He was started on Entresto which he is getting paid for by the company.  Unfortunately blood pressure remains low to low normal today 110/70.  Heart rate 67.  He is on no additional heart failure medications.  He cannot note any significant symptomatic difference on this medicine.  He has remained on Repatha.  His lipids have climbed somewhat with LDL now 92 but previously was in the 70s.  He has been less active and a struggling with back pain.  He had to have fairly urgent neck surgery for some spinal cord compromise.  He did not ultimately undergo shoulder surgery.  PMHx:  Past Medical History:  Diagnosis Date   Arthritis    Carpal tunnel syndrome    Right   Cataracts, bilateral    very mild   Coronary artery disease    Depression  Early cataracts, bilateral    Fatty liver    Gallstones    GERD (gastroesophageal reflux disease)    Headache 2011   HX OF VISUAL MIGRAINES - NO PAIN --MIGRAINES ATTRIBUTED TO PT CUTTING NICOTINE PATCH IN HALF BEFORE APPLYING TO HIS SKIN.     Hepatitis 2001   C -treated and no problems since  2001 'cured'   History of hiatal hernia    History of kidney stones    found on CT Scan   HOH (hard of hearing)    lung ca dx'd 12/06/19   RIGHT LUNG   Muscle cramps    hands and legs   Osteopenia    Pain    KNEE OA   Pre-diabetes    Right lower lobe lung mass    Shortness of breath dyspnea    "SOMETIMES FEELS LIKE I CAN'T CATCH A DEEP BREATH "- ON GOING FOR YEARS - STATES MEDICAL DOCTOR SENT HIM FOR CARDIAC WORK UP 2011 - PT STATES HEART OK   Stroke    HX POSSIBLE TIA - RT LEG NUMBNESS AND TINGLING FINGERS AND NUMBNESS RT FACE - ALL RESOLVED ; STATES NEUROLOGIST COULD NOT FIND ANY EVIDENCE OF STROKE - THOUGHT PT'S EPISODE MIGHT BE  FROM CUTTING NICOTINE PATCH IN HALF AND APPLYING TO SKIN   Trigger finger    LEFT MIDDLE FINGER   Wears glasses     Past Surgical History:  Procedure Laterality Date   ANTERIOR CERVICAL DECOMP/DISCECTOMY FUSION N/A 09/26/2022   Procedure: Cervical four-five Anterior Cervical Decompression Fusion;  Surgeon: Barnett Abu, MD;  Location: [facility] OR;  Service: Neurosurgery;  Laterality: N/A;   BRONCHIAL BIOPSY  02/24/2020   Procedure: BRONCHIAL BIOPSIES;  Surgeon: Leslye Peer, MD;  Location: [facility] ENDOSCOPY;  Service: Pulmonary;;   BRONCHIAL BRUSHINGS  02/24/2020   Procedure: BRONCHIAL BRUSHINGS;  Surgeon: Leslye Peer, MD;  Location: [facility] ENDOSCOPY;  Service: Pulmonary;;   BRONCHIAL NEEDLE ASPIRATION BIOPSY  02/24/2020   Procedure: BRONCHIAL NEEDLE ASPIRATION BIOPSIES;  Surgeon: Leslye Peer, MD;  Location: [facility] ENDOSCOPY;  Service: Pulmonary;;   BRONCHIAL WASHINGS  02/24/2020   Procedure: BRONCHIAL WASHINGS;  Surgeon: Leslye Peer, MD;  Location: [facility] ENDOSCOPY;  Service: Pulmonary;;   COLONOSCOPY     CORONARY PRESSURE/FFR STUDY N/A 08/09/2022   Procedure: INTRAVASCULAR PRESSURE WIRE/FFR STUDY;  Surgeon: Orbie Pyo, MD;  Location: [facility] INVASIVE CV LAB;  Service: Cardiovascular;  Laterality: N/A;   INTERCOSTAL NERVE BLOCK Right 04/28/2020   Procedure: INTERCOSTAL NERVE BLOCK;  Surgeon: Corliss Skains, MD;  Location: [facility] OR;  Service: Thoracic;  Laterality: Right;   left ankle surgery     fractured   LEFT HEART CATH AND CORONARY ANGIOGRAPHY N/A 06/29/2021   Procedure: LEFT HEART CATH AND CORONARY ANGIOGRAPHY;  Surgeon: Orbie Pyo, MD;  Location: [facility] INVASIVE CV LAB;  Service: Cardiovascular;  Laterality: N/A;   LEFT HEART CATH AND CORONARY ANGIOGRAPHY N/A 08/09/2022   Procedure: LEFT HEART CATH AND CORONARY ANGIOGRAPHY;  Surgeon: Orbie Pyo, MD;  Location: [facility] INVASIVE CV LAB;  Service: Cardiovascular;  Laterality: N/A;   left knee surgery     MENISCAL REMOVED   left  shoulder     torn ligaments left shoulder   LUMBAR FUSION     L3 L4 L5   NODE DISSECTION Right 04/28/2020   Procedure: NODE DISSECTION;  Surgeon: Corliss Skains, MD;  Location: [facility] OR;  Service: Thoracic;  Laterality: Right;   RIGHT CARPAL TUNNEL RELEASE  2011   RIGHT KNEE ARTHROSCOPY   AUG OR SEPT 2015   AT [facility]   ROOT CANAL     ROTATOR CUFF REPAIR Left    2007   THORACOSCOPY Right 04/28/2020   XI ROBOTIC ASSISTED THORASCOPY-LOBECTOMY (Right)   TONSILLECTOMY     as child   TOTAL KNEE ARTHROPLASTY Left 07/14/2013   Procedure: LEFT TOTAL KNEE ARTHROPLASTY;  Surgeon: Shelda Pal, MD;  Location: [facility] ORS;  Service: Orthopedics;  Laterality: Left;   TOTAL KNEE ARTHROPLASTY Right 07/21/2014   Procedure: RIGHT TOTAL KNEE ARTHROPLASTY;  Surgeon: Shelda Pal, MD;  Location: [facility] ORS;  Service: Orthopedics;  Laterality: Right;   TOTAL SHOULDER ARTHROPLASTY Right 10/17/2019   Procedure: RIGHT REVERSE TOTAL SHOULDER REPLACEMENT;  Surgeon: Beverely Low, MD;  Location: [facility] ORS;  Service: Orthopedics;  Laterality: Right;  interscalene block   TRIGGER FINGER RELEASE Left    VIDEO BRONCHOSCOPY WITH ENDOBRONCHIAL NAVIGATION Right 02/24/2020   Procedure: VIDEO BRONCHOSCOPY WITH ENDOBRONCHIAL NAVIGATION;  Surgeon: Leslye Peer, MD;  Location: [facility] ENDOSCOPY;  Service: Pulmonary;  Laterality: Right;   WEDGE RESECTION Right 04/28/2020   XI ROBOTIC ASSISTED THORASCOPY-RIGHT LOWER LOBE WEDGE RESECTION (Right Chest)     FAMHx:  Family History  Problem Relation Age of Onset   Alcohol abuse Father    Hyperlipidemia Father    Heart attack Brother 40   Hyperlipidemia Brother    Hyperlipidemia Sister        3 sisters   Heart attack Brother    Hyperlipidemia Brother     SOCHx:   reports that he quit smoking about 10 years ago. His smoking use included cigarettes. He has never used smokeless tobacco. He reports that he does not currently use alcohol after a past usage of  about 7.0 standard drinks of alcohol per week. He reports that he does not currently use drugs after having used the following drugs: Marijuana.  ALLERGIES:  Allergies  Allergen Reactions   Statins Other (See Comments)    MYALGIAS   Chantix [Varenicline] Other (See Comments)    Ineffective; patient did not like the way it made him feel   Diclofenac Sodium Other (See Comments)   Tricor [Fenofibrate] Other (See Comments)    ineffective for cholesterol (2014)   Zetia [Ezetimibe] Other (See Comments)    ineffective for cholesterol   Rosuvastatin Calcium Other (See Comments)    Muscle pain    ROS: Pertinent items noted in HPI and remainder of comprehensive ROS otherwise negative.  HOME MEDS: Current Outpatient Medications on File Prior to Visit  Medication Sig Dispense Refill   aspirin EC 81 MG tablet Take 81 mg by mouth in the morning. Swallow whole.     clindamycin (CLEOCIN) 150 MG capsule Take 600 mg by mouth See admin instructions. Take 4 capsules (600 mg) by mouth prior to dental appointments     donepezil (ARICEPT) 10 MG tablet Take 1 tablet (10 mg total) by mouth in the morning. 90 tablet 4   Evolocumab (REPATHA SURECLICK) 140 MG/ML SOAJ Inject 140 mg into the skin every 14 (fourteen) days. 6 mL 3   ibuprofen (ADVIL) 200 MG tablet Take 600 mg by mouth every 6 (six) hours as needed (pain.).     Magnesium 500 MG TABS Take 500 mg by mouth in the morning.     sacubitril-valsartan (ENTRESTO) 24-26 MG Take 1 tablet by mouth 2 (two) times daily. 60 tablet 5   sertraline (ZOLOFT) 100 MG tablet Take 100  mg by mouth in the morning.     tamsulosin (FLOMAX) 0.4 MG CAPS capsule Take 0.4 mg by mouth in the morning.     methocarbamol (ROBAXIN) 500 MG tablet Take 1 tablet (500 mg total) by mouth every 6 (six) hours as needed for muscle spasms. (Patient not taking: Reported on 01/02/2023) 30 tablet 1   No current facility-administered medications on file prior to visit.    LABS/IMAGING: No  results found for this or any previous visit (from the past 48 hour(s)). No results found.  LIPID PANEL:    Component Value Date/Time   CHOL 151 12/12/2022 0956   TRIG 99 12/12/2022 0956   HDL 41 12/12/2022 0956   CHOLHDL 3.7 12/12/2022 0956   CHOLHDL 5.6 10/31/2007 0830   VLDL 26 10/31/2007 0830   LDLCALC 92 12/12/2022 0956    WEIGHTS: Wt Readings from Last 3 Encounters:  01/02/23 197 lb (89.4 kg)  12/21/22 196 lb 12.8 oz (89.3 kg)  12/20/22 196 lb 8 oz (89.1 kg)    VITALS: BP 110/70   Pulse 67   Ht 6\' 1"  (1.854 m)   Wt 197 lb (89.4 kg)   SpO2 98%   BMI 25.99 kg/m   EXAM: General appearance: alert and no distress Neck: no carotid bruit, no JVD, and thyroid not enlarged, symmetric, no tenderness/mass/nodules Lungs: clear to auscultation bilaterally Heart: regular rate and rhythm, S1, S2 normal, no murmur, click, rub or gallop Abdomen: soft, non-tender; bowel sounds normal; no masses,  no organomegaly Extremities: extremities normal, atraumatic, no cyanosis or edema Pulses: 2+ and symmetric Skin: Skin color, texture, turgor normal. No rashes or lesions Neurologic: Grossly normal Psych: Pleasant  EKG: Deferred  ASSESSMENT: Acute systolic congestive heart failure, LVEF 35 to 40% Abnormal coronary CT angiogram showing calcium score of 1300, 91st percentile, subtotal occlusion of the RCA with significant LAD and circumflex disease, positive FFR in the proximal RCA (06/2021) -confirmed by left heart catheterization in November 2022, medical management recommended Mixed dyslipidemia with high LDL cholesterol Possible familial hyperlipidemia-Dutch score 4 Strong family history of high cholesterol Statin intolerance, Zetia and fenofibrate intolerance-myalgias Abnormal CAC score of 1222, 93rd percentile for age and sex matched control (01/2020) Lung cancer status post wedge resection Small PFO-unknown significance  PLAN: 1.   Mr. Stavely has had some progressive  shortness of breath and was started on Entresto for reduced LVEF 35 to 40%.  At this point his blood pressure would not allow further titration of the medicine.  I advise adding an SGLT2 inhibitor for cardiovascular mortality benefit.  Will start Jardiance 10 mg daily.  Samples will be provided if available and will reach out for prior authorization.  Plan follow-up with me in about 3 months.  Chrystie Nose, MD, [facility], FACP  [place]  [facility]  Medical Director of the Advanced Lipid Disorders &  Cardiovascular Risk Reduction Clinic Diplomate of the American Board of Clinical Lipidology Attending Cardiologist  Direct Dial: [phone]  Fax: [phone]  Website:  [email or web address] 01/02/2023, 10:39 AM

## 2023-01-04 ENCOUNTER — Other Ambulatory Visit: Payer: Self-pay | Admitting: Neurological Surgery

## 2023-01-04 DIAGNOSIS — C3490 Malignant neoplasm of unspecified part of unspecified bronchus or lung: Secondary | ICD-10-CM

## 2023-01-12 ENCOUNTER — Telehealth: Payer: Self-pay

## 2023-01-12 NOTE — Telephone Encounter (Signed)
Called to offer PREP class at [facility] on May 14, he reports he his having brain MRI that day. He would like to attend the June 3 class, will contact mid-May to follow up and confirm

## 2023-01-23 ENCOUNTER — Ambulatory Visit
Admission: RE | Admit: 2023-01-23 | Discharge: 2023-01-23 | Disposition: A | Discharge status: Home / Self Care | Payer: Medicare Other | Source: Ambulatory Visit | Attending: Neurological Surgery | Admitting: Neurological Surgery

## 2023-01-23 DIAGNOSIS — C3490 Malignant neoplasm of unspecified part of unspecified bronchus or lung: Secondary | ICD-10-CM

## 2023-01-23 MED ORDER — GADOPICLENOL 0.5 MMOL/ML IV SOLN
9.0000 mL | Freq: Once | INTRAVENOUS | Status: AC | PRN
  Administered 2023-01-23: 9 mL via INTRAVENOUS

## 2023-01-30 ENCOUNTER — Other Ambulatory Visit: Payer: Medicare Other

## 2023-01-30 ENCOUNTER — Encounter (HOSPITAL_BASED_OUTPATIENT_CLINIC_OR_DEPARTMENT_OTHER): Payer: Self-pay

## 2023-02-01 ENCOUNTER — Telehealth: Payer: Self-pay

## 2023-02-01 NOTE — Telephone Encounter (Signed)
He returned my call, reports he is still having back pain and limited with walking/mobility. Since this is an exercise and nutrition class, I have asked him to ask his provider to re-send the referral when he is ready/able to attend/participate.

## 2023-02-01 NOTE — Telephone Encounter (Signed)
Okay to hold for 2 weeks and then if he will please provide [place] an update on symptoms after that time.  CCing Dr. Rennis Golden as [place] Childes.   Alver Sorrow, NP

## 2023-02-01 NOTE — Telephone Encounter (Signed)
Called re: PREP program schedule for June/July; left voicemail requesting call back.

## 2023-02-05 ENCOUNTER — Encounter (HOSPITAL_BASED_OUTPATIENT_CLINIC_OR_DEPARTMENT_OTHER): Payer: Self-pay

## 2023-02-15 NOTE — Telephone Encounter (Signed)
FYI patient reporting BP after holding jardiance as directed

## 2023-03-01 ENCOUNTER — Encounter: Payer: Self-pay | Admitting: Neurology

## 2023-03-01 ENCOUNTER — Other Ambulatory Visit: Payer: Self-pay | Admitting: Neurology

## 2023-03-02 ENCOUNTER — Telehealth: Payer: Self-pay | Admitting: Internal Medicine

## 2023-03-02 NOTE — Telephone Encounter (Signed)
  Per MyChart scheduling message:  My appointment request was for my concerns about the effects Sherryll Burger is having on my body; dizziness and imbalance along with BP changes. It is not as bad as when I was taking Jardiance but nearly so. I have been monitoring BP daily and can supply a record since 5/22. My question is, was it proscribed in response to my concerns on Jul 28 2022 of extreme fatigue? If so I would like to discuss stopping it since the problem turned out to be a 50% compression of my spine between C4 / C5.  In addition I have recently been diagnosed with ADHD and want to discuss possible medications.  I have also been getting temporary intense headaches, 3 in last 2 wk. That is very unusual for me.

## 2023-03-02 NOTE — Telephone Encounter (Signed)
  Per MyChart scheduling message:   My appointment request was for my concerns about the effects Sherryll Burger is having on my  body;dizziness and imbalance along with BP changes. It is not as bad as when I was taking  Jardiance but nearly so. I have been monitoring BP daily and can supply a record since 5/22. My question is, was it proscribed in response to my concerns on Jul 28 2022 of extreme  fatigue?  If so I would like to discuss stopping it since the problem turned out to be a  50% compression of my spine between C4 / C5.  In addition I have recently been diagnosed with ADHD and  want to discuss possible medications.   I have also been getting temporary intense headaches,  3 in last 2 wk. That is very unusual for me.     Spoke to the pt, he is currently taken [facility]. For the past 4-5 months pt has experienced very     painful headaches, dizziness, cough  and feeling  imbalanced. Pt stated he will try another medication. Pt stated he did not take his evening  and morning (today) dose of Entresto. Will forward to MD, nurse and pharm D for advise.    Blood pressure   6/21  129/77  HR 77 6/20    93/67   HR 96 ( headache)           126/77 HR 93 ( 2 hour nap)           103/69 HR 89 ( 2 hours later, headache) 6/19    101/62    HR 80

## 2023-03-02 NOTE — Telephone Encounter (Signed)
Spoke to the patient, explained Dr. Rennis Golden recommendation:   Ok to advise stopping Entresto - monitor blood pressure.   Pt voiced understanding.

## 2023-03-05 ENCOUNTER — Encounter (HOSPITAL_BASED_OUTPATIENT_CLINIC_OR_DEPARTMENT_OTHER): Payer: Self-pay

## 2023-03-13 ENCOUNTER — Encounter: Payer: Self-pay | Admitting: Internal Medicine

## 2023-03-23 ENCOUNTER — Other Ambulatory Visit: Payer: Self-pay

## 2023-03-23 ENCOUNTER — Inpatient Hospital Stay
Admission: RE | Admit: 2023-03-23 | Discharge: 2023-03-23 | Disposition: A | Discharge status: Home / Self Care | Payer: Self-pay | Source: Ambulatory Visit | Attending: Radiation Oncology | Admitting: Radiation Oncology

## 2023-03-23 DIAGNOSIS — C3491 Malignant neoplasm of unspecified part of right bronchus or lung: Secondary | ICD-10-CM

## 2023-03-26 ENCOUNTER — Inpatient Hospital Stay: Payer: Medicare Other | Attending: Internal Medicine

## 2023-03-26 ENCOUNTER — Other Ambulatory Visit: Payer: Self-pay | Admitting: Internal Medicine

## 2023-03-26 DIAGNOSIS — C3490 Malignant neoplasm of unspecified part of unspecified bronchus or lung: Secondary | ICD-10-CM

## 2023-03-27 ENCOUNTER — Telehealth: Payer: Self-pay | Admitting: Radiation Therapy

## 2023-03-27 ENCOUNTER — Other Ambulatory Visit: Payer: Self-pay | Admitting: Radiation Therapy

## 2023-03-27 DIAGNOSIS — C7951 Secondary malignant neoplasm of bone: Secondary | ICD-10-CM

## 2023-03-27 NOTE — Telephone Encounter (Signed)
Pt discussed in 7/15 brain and spine conference. PET scan recommended, this was ordered by Dr. Arbutus Ped and is scheduled on 7/24. He will see Dr. Mitzi Hansen on 7/26 to review those results and discuss treatment options.   Dr. Danielle Dess is willing to do a bone biopsy if requested/needed based on PET scan results.   I spoke with Mr. Lautner to share this information. He was thankful for the call and plans to attend the scheduled visits.   Jalene Mullet R.T.(R)(T) Radiation Special Procedures Navigator

## 2023-04-04 ENCOUNTER — Encounter: Payer: Self-pay | Admitting: Internal Medicine

## 2023-04-04 ENCOUNTER — Ambulatory Visit (HOSPITAL_COMMUNITY)
Admission: RE | Admit: 2023-04-04 | Discharge: 2023-04-04 | Disposition: A | Discharge status: Home / Self Care | Payer: Medicare Other | Source: Ambulatory Visit | Attending: Internal Medicine | Admitting: Internal Medicine

## 2023-04-04 DIAGNOSIS — C3490 Malignant neoplasm of unspecified part of unspecified bronchus or lung: Secondary | ICD-10-CM | POA: Insufficient documentation

## 2023-04-04 LAB — GLUCOSE, CAPILLARY: Glucose-Capillary: 100 mg/dL — ABNORMAL HIGH (ref 70–99)

## 2023-04-04 MED ORDER — FLUDEOXYGLUCOSE F - 18 (FDG) INJECTION
9.0000 | Freq: Once | INTRAVENOUS | Status: AC | PRN
  Administered 2023-04-04: 9.83 via INTRAVENOUS

## 2023-04-05 ENCOUNTER — Encounter: Payer: Self-pay | Admitting: Internal Medicine

## 2023-04-05 ENCOUNTER — Ambulatory Visit: Payer: Medicare Other | Attending: Internal Medicine | Admitting: Internal Medicine

## 2023-04-05 VITALS — BP 104/68 | HR 75 | Ht 73.0 in | Wt 196.8 lb

## 2023-04-05 DIAGNOSIS — E785 Hyperlipidemia, unspecified: Secondary | ICD-10-CM | POA: Insufficient documentation

## 2023-04-05 DIAGNOSIS — M791 Myalgia, unspecified site: Secondary | ICD-10-CM | POA: Diagnosis present

## 2023-04-05 DIAGNOSIS — T466X5D Adverse effect of antihyperlipidemic and antiarteriosclerotic drugs, subsequent encounter: Secondary | ICD-10-CM

## 2023-04-05 DIAGNOSIS — I5022 Chronic systolic (congestive) heart failure: Secondary | ICD-10-CM | POA: Diagnosis present

## 2023-04-05 DIAGNOSIS — I25118 Atherosclerotic heart disease of native coronary artery with other forms of angina pectoris: Secondary | ICD-10-CM | POA: Insufficient documentation

## 2023-04-05 DIAGNOSIS — T466X5A Adverse effect of antihyperlipidemic and antiarteriosclerotic drugs, initial encounter: Secondary | ICD-10-CM | POA: Diagnosis present

## 2023-04-05 DIAGNOSIS — I10 Essential (primary) hypertension: Secondary | ICD-10-CM | POA: Diagnosis present

## 2023-04-05 NOTE — Patient Instructions (Signed)
Medication Instructions:  No Changes *If you need a refill on your cardiac medications before your next appointment, please call your pharmacy*   Lab Work: MRN, Lipoprotein A If you have labs (blood work) drawn today and your tests are completely normal, you will receive your results only by: MyChart Message (if you have MyChart) OR A paper copy in the mail If you have any lab test that is abnormal or we need to change your treatment, we will call you to review the results.   Testing/Procedures: No Testing   Follow-Up: At [facility], you and your health needs are our priority.  As part of our continuing mission to provide you with exceptional heart care, we have created designated Provider Care Teams.  These Care Teams include your primary Cardiologist (physician) and Advanced Practice Providers (APPs -  Physician Assistants and Nurse Practitioners) who all work together to provide you with the care you need, when you need it.  We recommend signing up for the patient portal called "MyChart".  Sign up information is provided on this After Visit Summary.  MyChart is used to connect with patients for Virtual Visits (Telemedicine).  Patients are able to view lab/test results, encounter notes, upcoming appointments, etc.  Non-urgent messages can be sent to your provider as well.   To learn more about what you can do with MyChart, go to [email or web address].    Your next appointment:   6 month(s)  Provider:   Chrystie Nose, MD

## 2023-04-05 NOTE — Progress Notes (Signed)
LIPID CLINIC CONSULT NOTE  Chief Complaint:  Follow-up  Primary Care Physician: Blair Heys, MD  Primary Cardiologist:  Chrystie Nose, MD  HPI:  [name] [name] is a 72 y.o. male who is being seen today for the evaluation of dyslipidemia, statin intolerance at the request of Blair Heys, MD.  This is a pleasant 72 year old male kindly referred for management of dyslipidemia with a history of statin intolerance (this included atorvastatin, ezetimibe, pravastatin, fenofibrate and pitavastatin).  He reports longstanding dyslipidemia and has been tried on both multiple statins, ezetimibe and fenofibrate in the past causing myalgias which were intolerable.  More recently had lipids off of medication and total cholesterol was 267, triglycerides 166, HDL 53 and LDL 183.  Non-HDL cholesterol 214.  There is a strong family history of heart disease. He related that all 5 siblings (3 girls, 2 boys) take cholesterol medicine and both brothers had heart attacks. Father's cholesterol was very high, difficult to keep below 300 (per patient report).   06/11/2020  [name] [name] returns today for follow-up. Unfortunately he is CT calcium score was abnormal showing an elevated score of 1222, 93rd percentile for age and sex matched control. He denies any cardiac symptoms however was found on extracardiac imaging to have a macrolobulated mass in the right lower lobe concerning for primary bronchogenic neoplasm. Subsequently he underwent work-up and this was found to be hypermetabolic. 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 Fortunately no evidence of metastases and he is planning to undergo neoadjuvant therapy. He was able to get through the procedure well without any issues therefore I do not think that he has any significant obstructive coronary disease. He has responded well to Praluent with a marked reduction in cholesterol. Total is now 143,  triglycerides 103, HDL 37 and LDL of 87. For the time being we will continue this. He did get a grant from [employer] and I informed him that it may be that that is no longer active as of next summer. That foundation is not currently providing grants. We will have to seek out other all options possibly next year.  06/13/2021  [name] [name] returns today for follow-up.  Overall he says he is doing well.  He seems to now be in remission from the lung cancer which was picked up incidentally with calcium scoring.  Although he seems to be doing well from that and is well removed from chemotherapy, he reports he has had progressive fatigue and shortness of breath as well as some chest discomfort.  As noted he had a very high calcium score which was 1222.  He did not seem necessarily symptomatic at this time therefore we did not pursue additional testing however now may be having more symptoms that could be coronary.  06/24/2021  [name] [name] is seen today in follow-up.  He recently underwent CT coronary angiography for progressive fatigue and chest pressure.  He told me the other day he played pool for about an hour and a half and was completely exhausted and had chest pressure after it.  He said he took 3 full-strength aspirin with some relief in his symptoms.  Coronary CT showed a high calcium score of 1300, 91st percentile.  He was noted to have subtotal occlusion of the RCA (CAD RADS 5) and disease in the LAD and circumflex.  A small PFO was noted.  He was referred for FFR.  FFR showed positive for reduced flow in  the proximal RCA however the LAD and circumflex were not noted to be hemodynamically significant.  Cardiac catheterization has been recommended.  I shared the results with him today and he is in agreement.  Would like him to start aspirin 81 mg daily and will provide sublingual nitro as needed.  01/10/2022  [name] [name] returns today for follow-up.  He seems to be doing well.  He denies any anginal symptoms or worsening  shortness of breath and fatigue.  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  He has been on aggressive medical therapy for this.  I had started him on Praluent.  His cholesterol is lower with total now 153 down from 186 and LDL of 78 (down from 116).  He does not take a daily aspirin.  He said been having issues with his left shoulder which apparently has multiple rotator cuff muscle tears and he says he may need eventually to have shoulder replacement.  He is currently on a narcotic (Vicodin), 4 times daily.  07/28/2022  [name] [name] returns today for follow-up.  He requested a visit today because of progressive fatigue, left chest ache and shortness of breath.  He was seen back in August by Robin Searing, NP for preoperative risk assessment.  He is planning reverse left shoulder arthroplasty.  This procedure however was not performed and has been delayed until January due to some personal issues.  However, over the past several months, he has had worsening symptoms.  He is significantly more fatigued and short of breath and experiencing left-sided chest discomfort.  Of note, his last heart catheterization was in October 2022 which showed mild OM and LAD disease as well as high-grade disease of the distal left circumflex however this was considered a small vessel.  The right coronary was occluded with right to right and septal collaterals.  He is continue to follow with oncology regarding his lung cancer which is in remission without evidence of recurrent metastatic disease by CT in August.  Hemoglobin was 14.3 in August as well.  01/02/2023  [name] [name] returns today for follow-up of shortness of breath.  In the interim he had seen Gillian Shields in February for shortness of breath and had an echocardiogram which showed reduction in LVEF down to 35 to 40%.  The etiology for this is unknown however  although he does have coronary artery disease it seems that this is more likely to be nonischemic.  He had had a repeat catheterization in the fall as well that showed stable coronary anatomy without any blockages.  He was started on Entresto which he is getting paid for by the company.  Unfortunately blood pressure remains low to low normal today 110/70.  Heart rate 67.  He is on no additional heart failure medications.  He cannot note any significant symptomatic difference on this medicine.  He has remained on Repatha.  His lipids have climbed somewhat with LDL now 92 but previously was in the 70s.  He has been less active and a struggling with back pain.  He had to have fairly urgent neck surgery for some spinal cord compromise.  He did not ultimately undergo shoulder surgery.  04/05/2023  [name] [name] returns today for follow-up.  Unfortunately, he was unable to tolerate both Entresto and Jardiance saying he did not feel well.  He had some issues with low blood pressure although he has been tracking it closely and his  blood pressures have improved to generally between 100-1 20 systolic.  Blood pressure today was 104/68.  Unfortunately he is not currently on any heart failure medications.  He had seen a neuropsychiatrist who started him on Strattera.  He is interested in a stimulant such as extended release Ritalin, but was concerned about the risk of worsening cardiovascular disease.  I actually think this would be okay if he feels that it would be helpful for him.  While it may increase heart rate and blood pressure I think that may not be necessarily a bad thing as at this point we have very little room to put him on any guideline directed medical therapy for heart failure.  Perhaps he might benefit from low-dose beta-blocker but otherwise he could not tolerate some of the medications we typically would use for heart failure.  Unfortunately tells me he also has a new lesion on his spine which might be cancerous.   He said he had a PET scan yesterday and has follow-up with radiation oncology tomorrow.  PMHx:  Past Medical History:  Diagnosis Date   Arthritis    Carpal tunnel syndrome    Right   Cataracts, bilateral    very mild   Coronary artery disease    Depression    Early cataracts, bilateral    Fatty liver    Gallstones    GERD (gastroesophageal reflux disease)    Headache 2011   HX OF VISUAL MIGRAINES - NO PAIN --MIGRAINES ATTRIBUTED TO PT CUTTING NICOTINE PATCH IN HALF BEFORE APPLYING TO HIS SKIN.     Hepatitis 2001   C -treated and no problems since  2001 'cured'   History of hiatal hernia    History of kidney stones    found on CT Scan   HOH (hard of hearing)    lung ca dx'd 12/06/19   RIGHT LUNG   Muscle cramps    hands and legs   Osteopenia    Pain    KNEE OA   Pre-diabetes    Right lower lobe lung mass    Shortness of breath dyspnea    "SOMETIMES FEELS LIKE I CAN'T CATCH A DEEP BREATH "- ON GOING FOR YEARS - STATES MEDICAL DOCTOR SENT HIM FOR CARDIAC WORK UP 2011 - PT STATES HEART OK   Stroke (HCC)    HX POSSIBLE TIA - RT LEG NUMBNESS AND TINGLING FINGERS AND NUMBNESS RT FACE - ALL RESOLVED ; STATES NEUROLOGIST COULD NOT FIND ANY EVIDENCE OF STROKE - THOUGHT PT'S EPISODE MIGHT BE FROM CUTTING NICOTINE PATCH IN HALF AND APPLYING TO SKIN   Trigger finger    LEFT MIDDLE FINGER   Wears glasses     Past Surgical History:  Procedure Laterality Date   ANTERIOR CERVICAL DECOMP/DISCECTOMY FUSION N/A 09/26/2022   Procedure: Cervical four-five Anterior Cervical Decompression Fusion;  Surgeon: Barnett Abu, MD;  Location: [facility] OR;  Service: Neurosurgery;  Laterality: N/A;   BRONCHIAL BIOPSY  02/24/2020   Procedure: BRONCHIAL BIOPSIES;  Surgeon: Leslye Peer, MD;  Location: [facility] ENDOSCOPY;  Service: Pulmonary;;   BRONCHIAL BRUSHINGS  02/24/2020   Procedure: BRONCHIAL BRUSHINGS;  Surgeon: Leslye Peer, MD;  Location: [facility] ENDOSCOPY;  Service: Pulmonary;;   BRONCHIAL NEEDLE  ASPIRATION BIOPSY  02/24/2020   Procedure: BRONCHIAL NEEDLE ASPIRATION BIOPSIES;  Surgeon: Leslye Peer, MD;  Location: [facility] ENDOSCOPY;  Service: Pulmonary;;   BRONCHIAL WASHINGS  02/24/2020   Procedure: BRONCHIAL WASHINGS;  Surgeon: Leslye Peer, MD;  Location: [facility] ENDOSCOPY;  Service: Pulmonary;;   COLONOSCOPY     CORONARY PRESSURE/FFR STUDY N/A 08/09/2022   Procedure: INTRAVASCULAR PRESSURE WIRE/FFR STUDY;  Surgeon: Orbie Pyo, MD;  Location: [facility] INVASIVE CV LAB;  Service: Cardiovascular;  Laterality: N/A;   INTERCOSTAL NERVE BLOCK Right 04/28/2020   Procedure: INTERCOSTAL NERVE BLOCK;  Surgeon: Corliss Skains, MD;  Location: [facility] OR;  Service: Thoracic;  Laterality: Right;   left ankle surgery     fractured   LEFT HEART CATH AND CORONARY ANGIOGRAPHY N/A 06/29/2021   Procedure: LEFT HEART CATH AND CORONARY ANGIOGRAPHY;  Surgeon: Orbie Pyo, MD;  Location: [facility] INVASIVE CV LAB;  Service: Cardiovascular;  Laterality: N/A;   LEFT HEART CATH AND CORONARY ANGIOGRAPHY N/A 08/09/2022   Procedure: LEFT HEART CATH AND CORONARY ANGIOGRAPHY;  Surgeon: Orbie Pyo, MD;  Location: [facility] INVASIVE CV LAB;  Service: Cardiovascular;  Laterality: N/A;   left knee surgery     MENISCAL REMOVED   left shoulder     torn ligaments left shoulder   LUMBAR FUSION     L3 L4 L5   NODE DISSECTION Right 04/28/2020   Procedure: NODE DISSECTION;  Surgeon: Corliss Skains, MD;  Location: [facility] OR;  Service: Thoracic;  Laterality: Right;   RIGHT CARPAL TUNNEL RELEASE  2011   RIGHT KNEE ARTHROSCOPY   AUG OR SEPT 2015   AT [facility]   ROOT CANAL     ROTATOR CUFF REPAIR Left    2007   THORACOSCOPY Right 04/28/2020   XI ROBOTIC ASSISTED THORASCOPY-LOBECTOMY (Right)   TONSILLECTOMY     as child   TOTAL KNEE ARTHROPLASTY Left 07/14/2013   Procedure: LEFT TOTAL KNEE ARTHROPLASTY;  Surgeon: Shelda Pal, MD;  Location: [facility] ORS;  Service: Orthopedics;  Laterality: Left;   TOTAL KNEE  ARTHROPLASTY Right 07/21/2014   Procedure: RIGHT TOTAL KNEE ARTHROPLASTY;  Surgeon: Shelda Pal, MD;  Location: [facility] ORS;  Service: Orthopedics;  Laterality: Right;   TOTAL SHOULDER ARTHROPLASTY Right 10/17/2019   Procedure: RIGHT REVERSE TOTAL SHOULDER REPLACEMENT;  Surgeon: Beverely Low, MD;  Location: [facility] ORS;  Service: Orthopedics;  Laterality: Right;  interscalene block   TRIGGER FINGER RELEASE Left    VIDEO BRONCHOSCOPY WITH ENDOBRONCHIAL NAVIGATION Right 02/24/2020   Procedure: VIDEO BRONCHOSCOPY WITH ENDOBRONCHIAL NAVIGATION;  Surgeon: Leslye Peer, MD;  Location: [facility] ENDOSCOPY;  Service: Pulmonary;  Laterality: Right;   WEDGE RESECTION Right 04/28/2020   XI ROBOTIC ASSISTED THORASCOPY-RIGHT LOWER LOBE WEDGE RESECTION (Right Chest)     FAMHx:  Family History  Problem Relation Age of Onset   Alcohol abuse Father    Hyperlipidemia Father    Heart attack Brother 45   Hyperlipidemia Brother    Hyperlipidemia Sister        3 sisters   Heart attack Brother    Hyperlipidemia Brother     SOCHx:   reports that he quit smoking about 10 years ago. His smoking use included cigarettes. He has never used smokeless tobacco. He reports that he does not currently use alcohol after a past usage of about 7.0 standard drinks of alcohol per week. He reports that he does not currently use drugs after having used the following drugs: Marijuana.  ALLERGIES:  Allergies  Allergen Reactions   Statins Other (See Comments)    MYALGIAS   Chantix [Varenicline] Other (See Comments)    Ineffective; patient did not like the way it made him feel   Diclofenac Sodium Other (See Comments)   Tricor [Fenofibrate]  Other (See Comments)    ineffective for cholesterol (2014)   Zetia [Ezetimibe] Other (See Comments)    ineffective for cholesterol   Rosuvastatin Calcium Other (See Comments)    Muscle pain    ROS: Pertinent items noted in HPI and remainder of comprehensive ROS otherwise negative.  HOME  MEDS: Current Outpatient Medications on File Prior to Visit  Medication Sig Dispense Refill   aspirin EC 81 MG tablet Take 81 mg by mouth in the morning. Swallow whole.     atomoxetine (STRATTERA) 10 MG capsule Take 10 mg by mouth daily.     clindamycin (CLEOCIN) 150 MG capsule Take 600 mg by mouth See admin instructions. Take 4 capsules (600 mg) by mouth prior to dental appointments     Evolocumab (REPATHA SURECLICK) 140 MG/ML SOAJ Inject 140 mg into the skin every 14 (fourteen) days. 6 mL 3   ibuprofen (ADVIL) 200 MG tablet Take 600 mg by mouth every 6 (six) hours as needed (pain.).     Magnesium 500 MG TABS Take 500 mg by mouth in the morning.     sertraline (ZOLOFT) 100 MG tablet Take 100 mg by mouth in the morning.     tamsulosin (FLOMAX) 0.4 MG CAPS capsule Take 0.4 mg by mouth in the morning.     methocarbamol (ROBAXIN) 500 MG tablet Take 1 tablet (500 mg total) by mouth every 6 (six) hours as needed for muscle spasms. (Patient not taking: Reported on 01/02/2023) 30 tablet 1   No current facility-administered medications on file prior to visit.    LABS/IMAGING: Results for orders placed or performed during the hospital encounter of 04/04/23 (from the past 48 hour(s))  Glucose, capillary     Status: Abnormal   Collection Time: 04/04/23  1:11 PM  Result Value Ref Range   Glucose-Capillary 100 (H) 70 - 99 mg/dL    Comment: Glucose reference range applies only to samples taken after fasting for at least 8 hours.   NM PET Image Restage (PS) Skull Base to Thigh (F-18 FDG)  Result Date: 04/05/2023 CLINICAL DATA:  Subsequent treatment strategy for non-small cell lung cancer. Abnormal lumbar MRI. EXAM: NUCLEAR MEDICINE PET SKULL BASE TO THIGH TECHNIQUE: 9.83 mCi F-18 FDG was injected intravenously. Full-ring PET imaging was performed from the skull base to thigh after the radiotracer. CT data was obtained and used for attenuation correction and anatomic localization. Fasting blood glucose: 100  mg/dl COMPARISON:  Chest CT 16/06/9603. PET-CT 03/10/2020. CT lumbar spine 12/21/2022. Lumbar MRI 03/20/2023 and 12/12/2022. FINDINGS: Mediastinal blood pool activity: SUV max 1.8 NECK: No hypermetabolic cervical lymph nodes are identified. No suspicious activity identified within the pharyngeal mucosal space. Incidental CT findings: Bilateral carotid atherosclerosis. CHEST: There are no hypermetabolic mediastinal, hilar or axillary lymph nodes. No hypermetabolic pulmonary activity or suspicious nodularity. Incidental CT findings: Status post right lower lobectomy. Previously demonstrated small nodule posteriorly at the right lung base is not well visualized, although is grossly stable on image 93/4, without hypermetabolic activity. Mild centrilobular emphysema. Atherosclerosis of the aorta, great vessels and coronary arteries. ABDOMEN/PELVIS: There is no hypermetabolic activity within the liver, adrenal glands, spleen or pancreas. There is no hypermetabolic nodal activity in the abdomen or pelvis. Incidental CT findings: Aortic and branch vessel atherosclerosis without evidence of aneurysm. Small gallstones are noted. 5.1 cm cyst in the central portion of the right kidney without hypermetabolic activity; no follow-up imaging recommended. Prominent stool throughout the colon. Mild prostatomegaly. SKELETON: There are grossly stable postsurgical changes from  previous L3-5 posterior lumbar and interbody fusion (S1 transitional). However, there is new hypermetabolic activity at the operative levels, especially within the L3 and L4 vertebral bodies (SUV max 11.8). Multiple intraosseous lucencies have developed from the previous MRI, as seen on interval lumbar spine imaging. No paraspinal inflammatory changes are identified. No other osseous activity suspicious for metastatic disease. Incidental CT findings: Previous cervical fusion and right shoulder reverse arthroplasty. IMPRESSION: 1. Status post right lower lobectomy.  No evidence of local recurrence or extraosseous metastatic disease. 2. New hypermetabolic activity within the lumbar spine with new osseous lucencies status post previous L3-5 PLIF, as seen on recent lumbar spine imaging. No definite signs of acute inflammation or hardware loosening. Findings are concerning for metastatic disease or multiple myeloma. Tissue sampling recommended. 3. No other evidence of metastatic disease. 4. Aortic Atherosclerosis (ICD10-I70.0) and Emphysema (ICD10-J43.9). Electronically Signed   By: Carey Bullocks M.D.   On: 04/05/2023 10:52    LIPID PANEL:    Component Value Date/Time   CHOL 151 12/12/2022 0956   TRIG 99 12/12/2022 0956   HDL 41 12/12/2022 0956   CHOLHDL 3.7 12/12/2022 0956   CHOLHDL 5.6 10/31/2007 0830   VLDL 26 10/31/2007 0830   LDLCALC 92 12/12/2022 0956    WEIGHTS: Wt Readings from Last 3 Encounters:  04/05/23 196 lb 12.8 oz (89.3 kg)  01/02/23 197 lb (89.4 kg)  12/21/22 196 lb 12.8 oz (89.3 kg)    VITALS: BP 104/68 (BP Location: Left Arm, Patient Position: Sitting, Cuff Size: Normal)   Pulse 75   Ht 6\' 1"  (1.854 m)   Wt 196 lb 12.8 oz (89.3 kg)   SpO2 98%   BMI 25.96 kg/m   EXAM: General appearance: alert and no distress Neck: no carotid bruit, no JVD, and thyroid not enlarged, symmetric, no tenderness/mass/nodules Lungs: clear to auscultation bilaterally Heart: regular rate and rhythm, S1, S2 normal, no murmur, click, rub or gallop Abdomen: soft, non-tender; bowel sounds normal; no masses,  no organomegaly Extremities: extremities normal, atraumatic, no cyanosis or edema Pulses: 2+ and symmetric Skin: Skin color, texture, turgor normal. No rashes or lesions Neurologic: Grossly normal Psych: Pleasant  EKG: EKG Interpretation Date/Time:  Thursday April 05 2023 11:29:16 EDT Ventricular Rate:  75 PR Interval:  172 QRS Duration:  94 QT Interval:  394 QTC Calculation: 439 R Axis:   -88  Text Interpretation: Normal sinus rhythm  Left axis deviation Inferior infarct , age undetermined When compared with ECG of 09-Aug-2022 09:08, Premature ventricular complexes are no longer Present Confirmed by Zoila Shutter [phone]) on 04/05/2023 11:37:32 AM    ASSESSMENT: Chronic systolic congestive heart failure, LVEF 35 to 40% Abnormal coronary CT angiogram showing calcium score of 1300, 91st percentile, subtotal occlusion of the RCA with significant LAD and circumflex disease, positive FFR in the proximal RCA (06/2021) -confirmed by left heart catheterization in November 2022, medical management recommended Mixed dyslipidemia with high LDL cholesterol Possible familial hyperlipidemia-Dutch score 4 Strong family history of high cholesterol Statin intolerance, Zetia and fenofibrate intolerance-myalgias Abnormal CAC score of 1222, 93rd percentile for age and sex matched control (01/2020) Lung cancer status post wedge resection Small PFO-unknown significance  PLAN: 1.   Mr. Nguyen was found to have systolic congestive heart failure but has been intolerant of Entresto and Jardiance.  He is not on any heart failure medicines at this time primarily due to low blood pressure.  He said he was more symptomatic on the medications and without them.  He unfortunately  was recently noted to have a lesion on his spine which might be recurrent cancer.  He has a number of upcoming appointments to address this.  He is doing well on Repatha and had lipids which showed good improvement earlier this year.  He will need repeat lipids prior to follow-up in about 6 months.  For now I would not make any medicine adjustments.  If he wishes to proceed with stimulant therapy for his ADHD, that would be acceptable from a cardiac standpoint.  It actually may allow [place] to introduce a beta-blocker.  Follow-up with me in 6 months.  Chrystie Nose, MD, [facility], FACP  [place]  [facility]  Medical Director of the Advanced Lipid Disorders &  Cardiovascular Risk  Reduction Clinic Diplomate of the American Board of Clinical Lipidology Attending Cardiologist  Direct Dial: [phone]  Fax: [phone]  Website:  [email or web address] 04/05/2023, 12:46 PM

## 2023-04-05 NOTE — Progress Notes (Signed)
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  Patient has been complaining of lower back pain and was evaluated by Dr. Danielle Dess.  Imaging obtained showed a suspicious lesion at the L3.  Location(s) of Symptomatic Metastases: L3  PET 04/04/2023:   MRI L Spine 03/23/2023:   Past/Anticipated chemotherapy by medical oncology, if any:  Dr. Arbutus Ped 12/21/2022 -The scan showed bubbly lytic lesion at L3 and L5 bodies centered on the left L3 and bilateral L5 pedicle screws and this pattern are suggestive of reactive process to the pedicle screws but of course with his history of lung cancer metastatic disease or multiple myeloma could not be completely excluded.  -I recommended for the patient to have his myeloma panel performed today to rule out the possibility of multiple myeloma.  -I recommended for the patient to continue his routine follow-up visit and evaluation by Dr. Hilda Lias and if he is planning for spine fusion, he may consider biopsy of 1 of this lesion for confirmation of tissue diagnosis.    Pain on a scale of 0-10 is:  Denies pain today.  He reports he has occasional pain to his bilateral shoulders, lower back, and in his groin.  He takes 1000 mg tylenol on occasion and he receives spinal injections with neurosurgeon Dr. Danielle Dess.   If Spine Met(s), symptoms, if any, include: Bowel/Bladder retention or incontinence (please describe): No Numbness or weakness in extremities (please describe): Denies. Current Decadron regimen, if applicable: n/a  Ambulatory status? Walker? Wheelchair?: Ambulatory  SAFETY ISSUES: Prior radiation? No Pacemaker/ICD? No Possible current pregnancy? N/a Is the patient on methotrexate? No  Current Complaints / other details:

## 2023-04-06 ENCOUNTER — Encounter: Payer: Self-pay | Admitting: Radiation Oncology

## 2023-04-06 ENCOUNTER — Other Ambulatory Visit: Payer: Self-pay

## 2023-04-06 ENCOUNTER — Ambulatory Visit
Admission: RE | Admit: 2023-04-06 | Discharge: 2023-04-06 | Disposition: A | Discharge status: Home / Self Care | Payer: Medicare Other | Source: Ambulatory Visit | Attending: Radiation Oncology | Admitting: Radiation Oncology

## 2023-04-06 VITALS — BP 116/76 | HR 78 | Temp 98.0°F | Resp 20 | Ht 73.0 in | Wt 195.8 lb

## 2023-04-06 DIAGNOSIS — M47816 Spondylosis without myelopathy or radiculopathy, lumbar region: Secondary | ICD-10-CM | POA: Insufficient documentation

## 2023-04-06 DIAGNOSIS — Z8673 Personal history of transient ischemic attack (TIA), and cerebral infarction without residual deficits: Secondary | ICD-10-CM | POA: Diagnosis not present

## 2023-04-06 DIAGNOSIS — Z87442 Personal history of urinary calculi: Secondary | ICD-10-CM | POA: Diagnosis not present

## 2023-04-06 DIAGNOSIS — C3431 Malignant neoplasm of lower lobe, right bronchus or lung: Secondary | ICD-10-CM | POA: Diagnosis not present

## 2023-04-06 DIAGNOSIS — C7951 Secondary malignant neoplasm of bone: Secondary | ICD-10-CM | POA: Insufficient documentation

## 2023-04-06 DIAGNOSIS — I2510 Atherosclerotic heart disease of native coronary artery without angina pectoris: Secondary | ICD-10-CM | POA: Diagnosis not present

## 2023-04-06 DIAGNOSIS — Z79899 Other long term (current) drug therapy: Secondary | ICD-10-CM | POA: Diagnosis not present

## 2023-04-06 DIAGNOSIS — K219 Gastro-esophageal reflux disease without esophagitis: Secondary | ICD-10-CM | POA: Insufficient documentation

## 2023-04-06 DIAGNOSIS — M129 Arthropathy, unspecified: Secondary | ICD-10-CM | POA: Insufficient documentation

## 2023-04-06 DIAGNOSIS — N400 Enlarged prostate without lower urinary tract symptoms: Secondary | ICD-10-CM | POA: Diagnosis not present

## 2023-04-06 DIAGNOSIS — Z87891 Personal history of nicotine dependence: Secondary | ICD-10-CM | POA: Insufficient documentation

## 2023-04-06 DIAGNOSIS — I6523 Occlusion and stenosis of bilateral carotid arteries: Secondary | ICD-10-CM | POA: Insufficient documentation

## 2023-04-06 DIAGNOSIS — I700 Atherosclerosis of aorta: Secondary | ICD-10-CM | POA: Diagnosis not present

## 2023-04-06 DIAGNOSIS — M8580 Other specified disorders of bone density and structure, unspecified site: Secondary | ICD-10-CM | POA: Insufficient documentation

## 2023-04-06 DIAGNOSIS — M48061 Spinal stenosis, lumbar region without neurogenic claudication: Secondary | ICD-10-CM | POA: Insufficient documentation

## 2023-04-06 DIAGNOSIS — K760 Fatty (change of) liver, not elsewhere classified: Secondary | ICD-10-CM | POA: Insufficient documentation

## 2023-04-06 NOTE — Progress Notes (Signed)
Radiation Oncology         [phone] ________________________________  Name: [name] [name]        MRN: [id number]  Date of Service: 04/06/2023 DOB: Jan 30, 1951  CC:Blair Heys, MD  Barnett Abu, MD     REFERRING PHYSICIAN: Barnett Abu, MD   DIAGNOSIS: Stage IIIA non-small cell lung cancer, adenocarcinoma with new hypermetabolic activity within the lumbar spine; s/p lobectomy with lymph node dissection and chemotherapy   The encounter diagnosis was Metastatic cancer to spine [facility]).   HISTORY OF PRESENT ILLNESS: [name] [name] is a 72 y.o. male seen at the request of Dr. Danielle Dess. Patient was diagnosed with stage IIIA (T4, N0, M0) non-small cell lung cancer, mucinous adenocarcinoma in August 2021 treated with a right lower lobectomy with lymph node dissection and adjuvant chemotherapy. After completing 4 cycles of chemotherapy he remained under observation with Dr. Arbutus Ped.   He started to develop low back pain which prompted a CT of the lumbar spine on 12/21/22. Imaging showed bubbly lytic lesions at L3 and L5 bodies centered on the left L3 and bilateral L5 pedicle screws. It was unclear if these findings are reactive to the screws, metastatic disease, or multiple myeloma. Dr. Arbutus Ped ordered a PET scan and multiple myeloma panel for further workup.   The multiple myeloma workup was negative. PET scan on 04/04/23 showed new hypermetabolic activity within the lumbar spine with new osseous lucencies status post previous L3-5. No other evidence of metastatic disease was seen.   Of note, patient has an extensive history of low back pain. He had severe spondylosis and stenosis at L3-4 and L4-5. He underwent surgical decompression, stabilization, revision of fixation and arthrodesis L3-L5 with posterior lateral fixation and posterior lateral arthrodesis on 09/07/17. Most recently, he started to develop low back pain again as stated above. Dr. Danielle Dess has been giving him steroid injections to  this area that have provided recently.  He is present today with his supportive wife. His pain has been worse at night. He states he has to get up 1/4 of his nights to take 1000mg  Tylenol which alleviates some of his pain. He denies any numbness, tingling, or weakness in his legs or urinary incontinence.    PREVIOUS RADIATION THERAPY: No   PAST MEDICAL HISTORY:  Past Medical History:  Diagnosis Date   Arthritis    Carpal tunnel syndrome    Right   Cataracts, bilateral    very mild   Coronary artery disease    Depression    Early cataracts, bilateral    Fatty liver    Gallstones    GERD (gastroesophageal reflux disease)    Headache 2011   HX OF VISUAL MIGRAINES - NO PAIN --MIGRAINES ATTRIBUTED TO PT CUTTING NICOTINE PATCH IN HALF BEFORE APPLYING TO HIS SKIN.     Hepatitis 2001   C -treated and no problems since  2001 'cured'   History of hiatal hernia    History of kidney stones    found on CT Scan   HOH (hard of hearing)    lung ca dx'd 12/06/19   RIGHT LUNG   Muscle cramps    hands and legs   Osteopenia    Pain    KNEE OA   Pre-diabetes    Right lower lobe lung mass    Shortness of breath dyspnea    "SOMETIMES FEELS LIKE I CAN'T CATCH A DEEP BREATH "- ON GOING FOR YEARS - STATES MEDICAL DOCTOR SENT HIM FOR CARDIAC WORK UP 2011 -  PT STATES HEART OK   Stroke (HCC)    HX POSSIBLE TIA - RT LEG NUMBNESS AND TINGLING FINGERS AND NUMBNESS RT FACE - ALL RESOLVED ; STATES NEUROLOGIST COULD NOT FIND ANY EVIDENCE OF STROKE - THOUGHT PT'S EPISODE MIGHT BE FROM CUTTING NICOTINE PATCH IN HALF AND APPLYING TO SKIN   Trigger finger    LEFT MIDDLE FINGER   Wears glasses        PAST SURGICAL HISTORY: Past Surgical History:  Procedure Laterality Date   ANTERIOR CERVICAL DECOMP/DISCECTOMY FUSION N/A 09/26/2022   Procedure: Cervical four-five Anterior Cervical Decompression Fusion;  Surgeon: Barnett Abu, MD;  Location: [facility] OR;  Service: Neurosurgery;  Laterality: N/A;   BRONCHIAL  BIOPSY  02/24/2020   Procedure: BRONCHIAL BIOPSIES;  Surgeon: Leslye Peer, MD;  Location: [facility] ENDOSCOPY;  Service: Pulmonary;;   BRONCHIAL BRUSHINGS  02/24/2020   Procedure: BRONCHIAL BRUSHINGS;  Surgeon: Leslye Peer, MD;  Location: [facility] ENDOSCOPY;  Service: Pulmonary;;   BRONCHIAL NEEDLE ASPIRATION BIOPSY  02/24/2020   Procedure: BRONCHIAL NEEDLE ASPIRATION BIOPSIES;  Surgeon: Leslye Peer, MD;  Location: [facility] ENDOSCOPY;  Service: Pulmonary;;   BRONCHIAL WASHINGS  02/24/2020   Procedure: BRONCHIAL WASHINGS;  Surgeon: Leslye Peer, MD;  Location: [facility] ENDOSCOPY;  Service: Pulmonary;;   COLONOSCOPY     CORONARY PRESSURE/FFR STUDY N/A 08/09/2022   Procedure: INTRAVASCULAR PRESSURE WIRE/FFR STUDY;  Surgeon: Orbie Pyo, MD;  Location: [facility] INVASIVE CV LAB;  Service: Cardiovascular;  Laterality: N/A;   INTERCOSTAL NERVE BLOCK Right 04/28/2020   Procedure: INTERCOSTAL NERVE BLOCK;  Surgeon: Corliss Skains, MD;  Location: [facility] OR;  Service: Thoracic;  Laterality: Right;   left ankle surgery     fractured   LEFT HEART CATH AND CORONARY ANGIOGRAPHY N/A 06/29/2021   Procedure: LEFT HEART CATH AND CORONARY ANGIOGRAPHY;  Surgeon: Orbie Pyo, MD;  Location: [facility] INVASIVE CV LAB;  Service: Cardiovascular;  Laterality: N/A;   LEFT HEART CATH AND CORONARY ANGIOGRAPHY N/A 08/09/2022   Procedure: LEFT HEART CATH AND CORONARY ANGIOGRAPHY;  Surgeon: Orbie Pyo, MD;  Location: [facility] INVASIVE CV LAB;  Service: Cardiovascular;  Laterality: N/A;   left knee surgery     MENISCAL REMOVED   left shoulder     torn ligaments left shoulder   LUMBAR FUSION     L3 L4 L5   NODE DISSECTION Right 04/28/2020   Procedure: NODE DISSECTION;  Surgeon: Corliss Skains, MD;  Location: [facility] OR;  Service: Thoracic;  Laterality: Right;   RIGHT CARPAL TUNNEL RELEASE  2011   RIGHT KNEE ARTHROSCOPY   AUG OR SEPT 2015   AT [facility]   ROOT CANAL     ROTATOR CUFF REPAIR Left    2007    THORACOSCOPY Right 04/28/2020   XI ROBOTIC ASSISTED THORASCOPY-LOBECTOMY (Right)   TONSILLECTOMY     as child   TOTAL KNEE ARTHROPLASTY Left 07/14/2013   Procedure: LEFT TOTAL KNEE ARTHROPLASTY;  Surgeon: Shelda Pal, MD;  Location: [facility] ORS;  Service: Orthopedics;  Laterality: Left;   TOTAL KNEE ARTHROPLASTY Right 07/21/2014   Procedure: RIGHT TOTAL KNEE ARTHROPLASTY;  Surgeon: Shelda Pal, MD;  Location: [facility] ORS;  Service: Orthopedics;  Laterality: Right;   TOTAL SHOULDER ARTHROPLASTY Right 10/17/2019   Procedure: RIGHT REVERSE TOTAL SHOULDER REPLACEMENT;  Surgeon: Beverely Low, MD;  Location: [facility] ORS;  Service: Orthopedics;  Laterality: Right;  interscalene block   TRIGGER FINGER RELEASE Left    VIDEO BRONCHOSCOPY WITH ENDOBRONCHIAL NAVIGATION Right  02/24/2020   Procedure: VIDEO BRONCHOSCOPY WITH ENDOBRONCHIAL NAVIGATION;  Surgeon: Leslye Peer, MD;  Location: [facility] ENDOSCOPY;  Service: Pulmonary;  Laterality: Right;   WEDGE RESECTION Right 04/28/2020   XI ROBOTIC ASSISTED THORASCOPY-RIGHT LOWER LOBE WEDGE RESECTION (Right Chest)      FAMILY HISTORY:  Family History  Problem Relation Age of Onset   Alcohol abuse Father    Hyperlipidemia Father    Heart attack Brother 35   Hyperlipidemia Brother    Hyperlipidemia Sister        3 sisters   Heart attack Brother    Hyperlipidemia Brother      SOCIAL HISTORY:  reports that he quit smoking about 10 years ago. His smoking use included cigarettes. He has never used smokeless tobacco. He reports that he does not currently use alcohol after a past usage of about 7.0 standard drinks of alcohol per week. He reports that he does not currently use drugs after having used the following drugs: Marijuana.   ALLERGIES: Statins, Chantix [varenicline], Diclofenac sodium, Tricor [fenofibrate], Zetia [ezetimibe], and Rosuvastatin calcium   MEDICATIONS:  Current Outpatient Medications  Medication Sig Dispense Refill   aspirin EC 81 MG tablet  Take 81 mg by mouth in the morning. Swallow whole.     atomoxetine (STRATTERA) 10 MG capsule Take 10 mg by mouth daily.     clindamycin (CLEOCIN) 150 MG capsule Take 600 mg by mouth See admin instructions. Take 4 capsules (600 mg) by mouth prior to dental appointments     Evolocumab (REPATHA SURECLICK) 140 MG/ML SOAJ Inject 140 mg into the skin every 14 (fourteen) days. 6 mL 3   ibuprofen (ADVIL) 200 MG tablet Take 600 mg by mouth every 6 (six) hours as needed (pain.).     Magnesium 500 MG TABS Take 500 mg by mouth in the morning.     sertraline (ZOLOFT) 100 MG tablet Take 100 mg by mouth in the morning.     tamsulosin (FLOMAX) 0.4 MG CAPS capsule Take 0.4 mg by mouth in the morning.     methocarbamol (ROBAXIN) 500 MG tablet Take 1 tablet (500 mg total) by mouth every 6 (six) hours as needed for muscle spasms. (Patient not taking: Reported on 01/02/2023) 30 tablet 1   No current facility-administered medications for this encounter.     REVIEW OF SYSTEMS: Notable for that above.      PHYSICAL EXAM:  Wt Readings from Last 3 Encounters:  04/06/23 195 lb 12.8 oz (88.8 kg)  04/05/23 196 lb 12.8 oz (89.3 kg)  01/02/23 197 lb (89.4 kg)   Temp Readings from Last 3 Encounters:  04/06/23 98 F (36.7 C)  12/21/22 98.2 F (36.8 C) (Oral)  10/24/22 98 F (36.7 C) (Oral)   BP Readings from Last 3 Encounters:  04/06/23 116/76  04/05/23 104/68  01/02/23 110/70   Pulse Readings from Last 3 Encounters:  04/06/23 78  04/05/23 75  01/02/23 67   Pain Assessment Pain Score: 0-No pain/10  In general this is a well appearing male in no acute distress. He's alert and oriented x4 and appropriate throughout the examination. Cardiopulmonary assessment is negative for acute distress and he exhibits normal effort.     ECOG = 1  0 - Asymptomatic (Fully active, able to carry on all predisease activities without restriction)  1 - Symptomatic but completely ambulatory (Restricted in physically  strenuous activity but ambulatory and able to carry out work of a light or sedentary nature. For example, light housework,  office work)  2 - Symptomatic, <50% in bed during the day (Ambulatory and capable of all self care but unable to carry out any work activities. Up and about more than 50% of waking hours)  3 - Symptomatic, >50% in bed, but not bedbound (Capable of only limited self-care, confined to bed or chair 50% or more of waking hours)  4 - Bedbound (Completely disabled. Cannot carry on any self-care. Totally confined to bed or chair)  5 - Death   Santiago Glad MM, Creech RH, Tormey DC, et al. [phone]). "Toxicity and response criteria of the [facility]". Am. Evlyn Clines. Oncol. 5 (6): 649-55    LABORATORY DATA:  Lab Results  Component Value Date   WBC 8.4 10/20/2022   HGB 14.3 10/20/2022   HCT 42.4 10/20/2022   MCV 88.9 10/20/2022   PLT 206 10/20/2022   Lab Results  Component Value Date   NA 139 11/10/2022   K 4.8 11/10/2022   CL 98 11/10/2022   CO2 22 11/10/2022   Lab Results  Component Value Date   ALT 27 10/20/2022   AST 25 10/20/2022   ALKPHOS 80 10/20/2022   BILITOT 0.6 10/20/2022      RADIOGRAPHY: NM PET Image Restage (PS) Skull Base to Thigh (F-18 FDG)  Result Date: 04/05/2023 CLINICAL DATA:  Subsequent treatment strategy for non-small cell lung cancer. Abnormal lumbar MRI. EXAM: NUCLEAR MEDICINE PET SKULL BASE TO THIGH TECHNIQUE: 9.83 mCi F-18 FDG was injected intravenously. Full-ring PET imaging was performed from the skull base to thigh after the radiotracer. CT data was obtained and used for attenuation correction and anatomic localization. Fasting blood glucose: 100 mg/dl COMPARISON:  Chest CT 96/12/5407. PET-CT 03/10/2020. CT lumbar spine 12/21/2022. Lumbar MRI 03/20/2023 and 12/12/2022. FINDINGS: Mediastinal blood pool activity: SUV max 1.8 NECK: No hypermetabolic cervical lymph nodes are identified. No suspicious activity identified within the  pharyngeal mucosal space. Incidental CT findings: Bilateral carotid atherosclerosis. CHEST: There are no hypermetabolic mediastinal, hilar or axillary lymph nodes. No hypermetabolic pulmonary activity or suspicious nodularity. Incidental CT findings: Status post right lower lobectomy. Previously demonstrated small nodule posteriorly at the right lung base is not well visualized, although is grossly stable on image 93/4, without hypermetabolic activity. Mild centrilobular emphysema. Atherosclerosis of the aorta, great vessels and coronary arteries. ABDOMEN/PELVIS: There is no hypermetabolic activity within the liver, adrenal glands, spleen or pancreas. There is no hypermetabolic nodal activity in the abdomen or pelvis. Incidental CT findings: Aortic and branch vessel atherosclerosis without evidence of aneurysm. Small gallstones are noted. 5.1 cm cyst in the central portion of the right kidney without hypermetabolic activity; no follow-up imaging recommended. Prominent stool throughout the colon. Mild prostatomegaly. SKELETON: There are grossly stable postsurgical changes from previous L3-5 posterior lumbar and interbody fusion (S1 transitional). However, there is new hypermetabolic activity at the operative levels, especially within the L3 and L4 vertebral bodies (SUV max 11.8). Multiple intraosseous lucencies have developed from the previous MRI, as seen on interval lumbar spine imaging. No paraspinal inflammatory changes are identified. No other osseous activity suspicious for metastatic disease. Incidental CT findings: Previous cervical fusion and right shoulder reverse arthroplasty. IMPRESSION: 1. Status post right lower lobectomy. No evidence of local recurrence or extraosseous metastatic disease. 2. New hypermetabolic activity within the lumbar spine with new osseous lucencies status post previous L3-5 PLIF, as seen on recent lumbar spine imaging. No definite signs of acute inflammation or hardware loosening.  Findings are concerning for metastatic disease or multiple myeloma.  Tissue sampling recommended. 3. No other evidence of metastatic disease. 4. Aortic Atherosclerosis (ICD10-I70.0) and Emphysema (ICD10-J43.9). Electronically Signed   By: Carey Bullocks M.D.   On: 04/05/2023 10:52       IMPRESSION/PLAN: 1. Stage IIIA non-small cell lung cancer, adenocarcinoma with new hypermetabolic activity within the lumbar spine; s/p lobectomy with lymph node dissection and chemotherapy  Recent PET shows concerns for metastatic disease or multiple myeloma to the L3-5 spine. This happens to be the same area where he had spinal surgery. It is unclear if the hypermetabolism is due to disease or inflammation. Dr. Danielle Dess believes he can biopsy this lesion. If pathology reveals metastatic disease, patient is a candidate for palliative radiation to this area. We will wait for tissue confirmation prior to treatment.  We discussed the available radiation techniques, and focused on the details and logistics of delivery. We discussed and outlined the risks, benefits, short and long-term effects associated with radiotherapy. Patient and his wife were encouraged to ask questions that were answered to their stated satisfaction.   We will wait for final pathology from spine biopsy. Patient is currently waiting for biopsy to be scheduled with Dr. Verlee Rossetti office.    In a visit lasting 60 minutes, greater than 50% of the time was spent face to face discussing the patient's condition, in preparation for the discussion, and coordinating the patient's care.   The above documentation reflects my direct findings during this shared patient visit. Please see the separate note by Dr. Mitzi Hansen on this date for the remainder of the patient's plan of care.    Joyice Faster, PA-C    **Disclaimer: This note was dictated with voice recognition software. Similar sounding words can inadvertently be transcribed and this note may contain  transcription errors which may not have been corrected upon publication of note.**

## 2023-04-11 ENCOUNTER — Other Ambulatory Visit: Payer: Self-pay | Admitting: Neurological Surgery

## 2023-04-12 NOTE — Progress Notes (Signed)
Surgical Instructions   Your procedure is scheduled on Monday April 16, 2023. Report to [facility] Main Entrance "A" at 2:55 P.M. then check in with the Admitting office. Any questions or running late day of surgery: call [phone]  Questions prior to your surgery date: call [phone], Monday-Friday, 8am-4pm. If you experience any cold or flu symptoms such as cough, fever, chills, shortness of breath, etc. between now and your scheduled surgery, please notify us at the above number.     Remember:  Do not eat after midnight the night before your surgery  You may drink clear liquids until 1:55 P.M. the day of your surgery.   Clear liquids allowed are: Water, Non-Citrus Juices (without pulp), Carbonated Beverages, Clear Tea, Black Coffee Only (NO MILK, CREAM OR POWDERED CREAMER of any kind), and Gatorade.    Take these medicines the morning of surgery with A SIP OF WATER  loratadine (CLARITIN)  sertraline (ZOLOFT)  tamsulosin [facility])   May take these medicines IF NEEDED: acetaminophen (TYLENOL)    Follow your surgeon's instructions on when to stop Aspirin.  If no instructions were given by your surgeon then you will need to call the office to get those instructions.     One week prior to surgery, STOP taking any Aleve, Naproxen, Ibuprofen, Motrin, Advil, Goody's, BC's, all herbal medications, fish oil, and non-prescription vitamins.                     Do NOT Smoke (Tobacco/Vaping) for 24 hours prior to your procedure.  If you use a CPAP at night, you may bring your mask/headgear for your overnight stay.   You will be asked to remove any contacts, glasses, piercing's, hearing aid's, dentures/partials prior to surgery. Please bring cases for these items if needed.    Patients discharged the day of surgery will not be allowed to drive home, and someone needs to stay with them for 24 hours.  SURGICAL WAITING ROOM VISITATION Patients may have no more than 2 support people in  the waiting area - these visitors may rotate.   Pre-op nurse will coordinate an appropriate time for 1 ADULT support person, who may not rotate, to accompany patient in pre-op.  Children under the age of 77 must have an adult with them who is not the patient and must remain in the main waiting area with an adult.  If the patient needs to stay at the hospital during part of their recovery, the visitor guidelines for inpatient rooms apply.  Please refer to the [facility] website for the visitor guidelines for any additional information.   If you received a COVID test during your pre-op visit  it is requested that you wear a mask when out in public, stay away from anyone that may not be feeling well and notify your surgeon if you develop symptoms. If you have been in contact with anyone that has tested positive in the last 10 days please notify you surgeon.      Pre-operative 5 CHG Bathing Instructions   You can play a key role in reducing the risk of infection after surgery. Your skin needs to be as free of germs as possible. You can reduce the number of germs on your skin by washing with CHG (chlorhexidine gluconate) soap before surgery. CHG is an antiseptic soap that kills germs and continues to kill germs even after washing.   DO NOT use if you have an allergy to chlorhexidine/CHG or antibacterial soaps. If  your skin becomes reddened or irritated, stop using the CHG and notify one of our RNs at [phone].   Please shower with the CHG soap starting 4 days before surgery using the following schedule:     Please keep in mind the following:  DO NOT shave, including legs and underarms, starting the day of your first shower.   You may shave your face at any point before/day of surgery.  Place clean sheets on your bed the day you start using CHG soap. Use a clean washcloth (not used since being washed) for each shower. DO NOT sleep with pets once you start using the CHG.   CHG Shower  Instructions:  If you choose to wash your hair and private area, wash first with your normal shampoo/soap.  After you use shampoo/soap, rinse your hair and body thoroughly to remove shampoo/soap residue.  Turn the water OFF and apply about 3 tablespoons (45 ml) of CHG soap to a CLEAN washcloth.  Apply CHG soap ONLY FROM YOUR NECK DOWN TO YOUR TOES (washing for 3-5 minutes)  DO NOT use CHG soap on face, private areas, open wounds, or sores.  Pay special attention to the area where your surgery is being performed.  If you are having back surgery, having someone wash your back for you may be helpful. Wait 2 minutes after CHG soap is applied, then you may rinse off the CHG soap.  Pat dry with a clean towel  Put on clean clothes/pajamas   If you choose to wear lotion, please use ONLY the CHG-compatible lotions on the back of this paper.   Additional instructions for the day of surgery: DO NOT APPLY any lotions, deodorants or cologne.    Do not bring valuables to the hospital. [facility] is not responsible for any belongings/valuables. Put on clean/comfortable clothes.  Please brush your teeth.  Ask your nurse before applying any prescription medications to the skin.     CHG Compatible Lotions   Aveeno Moisturizing lotion  Cetaphil Moisturizing Cream  Cetaphil Moisturizing Lotion  Clairol Herbal Essence Moisturizing Lotion, Dry Skin  Clairol Herbal Essence Moisturizing Lotion, Extra Dry Skin  Clairol Herbal Essence Moisturizing Lotion, Normal Skin  Curel Age Defying Therapeutic Moisturizing Lotion with Alpha Hydroxy  Curel Extreme Care Body Lotion  Curel Soothing Hands Moisturizing Hand Lotion  Curel Therapeutic Moisturizing Cream, Fragrance-Free  Curel Therapeutic Moisturizing Lotion, Fragrance-Free  Curel Therapeutic Moisturizing Lotion, Original Formula  Eucerin Daily Replenishing Lotion  Eucerin Dry Skin Therapy Plus Alpha Hydroxy Crme  Eucerin Dry Skin Therapy Plus Alpha  Hydroxy Lotion  Eucerin Original Crme  Eucerin Original Lotion  Eucerin Plus Crme Eucerin Plus Lotion  Eucerin TriLipid Replenishing Lotion  Keri Anti-Bacterial Hand Lotion  Keri Deep Conditioning Original Lotion Dry Skin Formula Softly Scented  Keri Deep Conditioning Original Lotion, Fragrance Free Sensitive Skin Formula  Keri Lotion Fast Absorbing Fragrance Free Sensitive Skin Formula  Keri Lotion Fast Absorbing Softly Scented Dry Skin Formula  Keri Original Lotion  Keri Skin Renewal Lotion Keri Silky Smooth Lotion  Keri Silky Smooth Sensitive Skin Lotion  Nivea Body Creamy Conditioning Oil  Nivea Body Extra Enriched Lotion  Nivea Body Original Lotion  Nivea Body Sheer Moisturizing Lotion Nivea Crme  Nivea Skin Firming Lotion  NutraDerm 30 Skin Lotion  NutraDerm Skin Lotion  NutraDerm Therapeutic Skin Cream  NutraDerm Therapeutic Skin Lotion  ProShield Protective Hand Cream  Provon moisturizing lotion  Please read over the following fact sheets that you were given.

## 2023-04-13 ENCOUNTER — Encounter (HOSPITAL_COMMUNITY): Payer: Self-pay

## 2023-04-13 ENCOUNTER — Other Ambulatory Visit: Payer: Self-pay

## 2023-04-13 ENCOUNTER — Encounter (HOSPITAL_COMMUNITY)
Admission: RE | Admit: 2023-04-13 | Discharge: 2023-04-13 | Disposition: A | Discharge status: Home / Self Care | Payer: Medicare Other | Source: Ambulatory Visit | Attending: Neurological Surgery | Admitting: Neurological Surgery

## 2023-04-13 VITALS — BP 101/72 | HR 99 | Temp 98.3°F | Resp 18 | Ht 73.0 in | Wt 196.4 lb

## 2023-04-13 DIAGNOSIS — Z01818 Encounter for other preprocedural examination: Secondary | ICD-10-CM | POA: Diagnosis not present

## 2023-04-13 HISTORY — DX: Attention-deficit hyperactivity disorder, unspecified type: F90.9

## 2023-04-13 LAB — COMPREHENSIVE METABOLIC PANEL
ALT: 20 U/L (ref 0–44)
AST: 26 U/L (ref 15–41)
Albumin: 4.2 g/dL (ref 3.5–5.0)
Alkaline Phosphatase: 92 U/L (ref 38–126)
Anion gap: 9 (ref 5–15)
BUN: 16 mg/dL (ref 8–23)
CO2: 26 mmol/L (ref 22–32)
Calcium: 9.4 mg/dL (ref 8.9–10.3)
Chloride: 101 mmol/L (ref 98–111)
Creatinine, Ser: 1.06 mg/dL (ref 0.61–1.24)
GFR, Estimated: >60 mL/min (ref >60)
Glucose, Bld: 103 mg/dL — ABNORMAL HIGH (ref 70–99)
Potassium: 4.3 mmol/L (ref 3.5–5.1)
Sodium: 136 mmol/L (ref 135–145)
Total Bilirubin: 0.9 mg/dL (ref 0.3–1.2)
Total Protein: 7.3 g/dL (ref 6.5–8.1)

## 2023-04-13 LAB — CBC
HCT: 45.7 % (ref 39.0–52.0)
Hemoglobin: 14.8 g/dL (ref 13.0–17.0)
MCH: 29.9 pg (ref 26.0–34.0)
MCHC: 32.4 g/dL (ref 30.0–36.0)
MCV: 92.3 fL (ref 80.0–100.0)
Platelets: 187 10*3/uL (ref 150–400)
RBC: 4.95 MIL/uL (ref 4.22–5.81)
RDW: 13 % (ref 11.5–15.5)
WBC: 8 10*3/uL (ref 4.0–10.5)
nRBC: 0 % (ref 0.0–0.2)

## 2023-04-13 LAB — SURGICAL PCR SCREEN
MRSA, PCR: NEGATIVE
Staphylococcus aureus: NEGATIVE

## 2023-04-13 NOTE — Progress Notes (Signed)
PCP - Dr Blair Heys Cardiologist - Dr Zoila Shutter  CT Chest x-ray - 10/20/22 EKG - 04/05/23 Stress Test - 02/20/20 ECHO - 09/21/22 Cardiac Cath - 08/09/22  ICD Pacemaker/Loop - n/a  Sleep Study -  n/a CPAP - none  Diabetes Type - n/a  Aspirin Instructions: Last dose was on 04/10/23.   ERAS: Clear liquids til 1:55 PM DOS  Anesthesia review: Yes  STOP now taking any Aspirin (unless otherwise instructed by your surgeon), Aleve, Naproxen, Ibuprofen, Motrin, Advil, Goody's, BC's, all herbal medications, fish oil, and all vitamins.   Coronavirus Screening Do you have any of the following symptoms:  Cough yes/no: No Fever (>100.70F)  yes/no: No Runny nose yes/no: No Sore throat yes/no: No Difficulty breathing/shortness of breath  yes/no: No  Have you traveled in the last 14 days and where? yes/no: No  Patient verbalized understanding of instructions that were given to them at the PAT appointment. Patient was also instructed that they will need to review over the PAT instructions again at home before surgery.

## 2023-04-16 ENCOUNTER — Encounter: Payer: Self-pay | Admitting: Internal Medicine

## 2023-04-16 ENCOUNTER — Ambulatory Visit (HOSPITAL_COMMUNITY): Payer: Medicare Other

## 2023-04-16 ENCOUNTER — Encounter (HOSPITAL_COMMUNITY)
Admission: RE | Disposition: A | Discharge status: Home / Self Care | Payer: Self-pay | Source: Home / Self Care | Attending: Neurological Surgery

## 2023-04-16 ENCOUNTER — Ambulatory Visit (HOSPITAL_COMMUNITY)
Admission: RE | Admit: 2023-04-16 | Discharge: 2023-04-16 | Disposition: A | Discharge status: Home / Self Care | Payer: Medicare Other | Attending: Neurological Surgery | Admitting: Neurological Surgery

## 2023-04-16 ENCOUNTER — Encounter (HOSPITAL_COMMUNITY): Payer: Self-pay | Admitting: Neurological Surgery

## 2023-04-16 ENCOUNTER — Ambulatory Visit (HOSPITAL_COMMUNITY): Payer: Medicare Other | Admitting: Anesthesiology

## 2023-04-16 ENCOUNTER — Ambulatory Visit (HOSPITAL_BASED_OUTPATIENT_CLINIC_OR_DEPARTMENT_OTHER): Payer: Medicare Other | Admitting: Anesthesiology

## 2023-04-16 ENCOUNTER — Other Ambulatory Visit: Payer: Self-pay

## 2023-04-16 DIAGNOSIS — M899 Disorder of bone, unspecified: Secondary | ICD-10-CM | POA: Diagnosis not present

## 2023-04-16 DIAGNOSIS — M1990 Unspecified osteoarthritis, unspecified site: Secondary | ICD-10-CM | POA: Insufficient documentation

## 2023-04-16 DIAGNOSIS — Z85118 Personal history of other malignant neoplasm of bronchus and lung: Secondary | ICD-10-CM | POA: Insufficient documentation

## 2023-04-16 DIAGNOSIS — M8580 Other specified disorders of bone density and structure, unspecified site: Secondary | ICD-10-CM | POA: Diagnosis not present

## 2023-04-16 DIAGNOSIS — I679 Cerebrovascular disease, unspecified: Secondary | ICD-10-CM

## 2023-04-16 DIAGNOSIS — G9589 Other specified diseases of spinal cord: Secondary | ICD-10-CM

## 2023-04-16 DIAGNOSIS — Z87891 Personal history of nicotine dependence: Secondary | ICD-10-CM

## 2023-04-16 DIAGNOSIS — G709 Myoneural disorder, unspecified: Secondary | ICD-10-CM | POA: Insufficient documentation

## 2023-04-16 DIAGNOSIS — Z981 Arthrodesis status: Secondary | ICD-10-CM | POA: Insufficient documentation

## 2023-04-16 DIAGNOSIS — K219 Gastro-esophageal reflux disease without esophagitis: Secondary | ICD-10-CM | POA: Diagnosis not present

## 2023-04-16 DIAGNOSIS — I2510 Atherosclerotic heart disease of native coronary artery without angina pectoris: Secondary | ICD-10-CM | POA: Insufficient documentation

## 2023-04-16 LAB — AEROBIC/ANAEROBIC CULTURE W GRAM STAIN (SURGICAL/DEEP WOUND): Gram Stain: NONE SEEN

## 2023-04-16 SURGERY — BONE MARROW ASPIRATION FOR SPINE FUSION ONLY (BMAC)
Anesthesia: General

## 2023-04-16 MED ORDER — LIDOCAINE-EPINEPHRINE 1 %-1:100000 IJ SOLN
INTRAMUSCULAR | Status: DC | PRN
  Administered 2023-04-16: 5 mL

## 2023-04-16 MED ORDER — PROPOFOL 10 MG/ML IV BOLUS
INTRAVENOUS | Status: DC | PRN
Start: 2023-04-16 — End: 2023-04-16
  Administered 2023-04-16: 140 mg via INTRAVENOUS

## 2023-04-16 MED ORDER — CHLORHEXIDINE GLUCONATE 0.12 % MT SOLN
15.0000 mL | OROMUCOSAL | Status: AC
  Filled 2023-04-16: qty 15

## 2023-04-16 MED ORDER — FENTANYL CITRATE (PF) 250 MCG/5ML IJ SOLN
INTRAMUSCULAR | Status: DC | PRN
  Administered 2023-04-16: 25 ug via INTRAVENOUS
  Administered 2023-04-16: 100 ug via INTRAVENOUS
  Administered 2023-04-16: 25 ug via INTRAVENOUS

## 2023-04-16 MED ORDER — BUPIVACAINE HCL (PF) 0.5 % IJ SOLN
INTRAMUSCULAR | Status: DC | PRN
  Administered 2023-04-16: 5 mL

## 2023-04-16 MED ORDER — CHLORHEXIDINE GLUCONATE 0.12 % MT SOLN
OROMUCOSAL | Status: AC
  Administered 2023-04-16: 15 mL via OROMUCOSAL
  Filled 2023-04-16: qty 15

## 2023-04-16 MED ORDER — PROPOFOL 10 MG/ML IV BOLUS
INTRAVENOUS | Status: AC
  Filled 2023-04-16: qty 20

## 2023-04-16 MED ORDER — ROCURONIUM BROMIDE 10 MG/ML (PF) SYRINGE
PREFILLED_SYRINGE | INTRAVENOUS | Status: DC | PRN
  Administered 2023-04-16: 40 mg via INTRAVENOUS

## 2023-04-16 MED ORDER — MIDAZOLAM HCL 2 MG/2ML IJ SOLN
INTRAMUSCULAR | Status: DC | PRN
  Administered 2023-04-16: 2 mg via INTRAVENOUS

## 2023-04-16 MED ORDER — MIDAZOLAM HCL 2 MG/2ML IJ SOLN
INTRAMUSCULAR | Status: AC
  Filled 2023-04-16: qty 2

## 2023-04-16 MED ORDER — LIDOCAINE-EPINEPHRINE 1 %-1:100000 IJ SOLN
INTRAMUSCULAR | Status: AC
  Filled 2023-04-16: qty 1

## 2023-04-16 MED ORDER — LIDOCAINE 2% (20 MG/ML) 5 ML SYRINGE
INTRAMUSCULAR | Status: DC | PRN
  Administered 2023-04-16: 80 mg via INTRAVENOUS

## 2023-04-16 MED ORDER — PHENYLEPHRINE 80 MCG/ML (10ML) SYRINGE FOR IV PUSH (FOR BLOOD PRESSURE SUPPORT)
PREFILLED_SYRINGE | INTRAVENOUS | Status: AC
  Filled 2023-04-16: qty 30

## 2023-04-16 MED ORDER — CEFAZOLIN SODIUM-DEXTROSE 2-3 GM-%(50ML) IV SOLR
INTRAVENOUS | Status: DC | PRN
Start: 2023-04-16 — End: 2023-04-16
  Administered 2023-04-16: 2 g via INTRAVENOUS

## 2023-04-16 MED ORDER — FENTANYL CITRATE (PF) 250 MCG/5ML IJ SOLN
INTRAMUSCULAR | Status: AC
  Filled 2023-04-16: qty 5

## 2023-04-16 MED ORDER — ONDANSETRON HCL 4 MG/2ML IJ SOLN
INTRAMUSCULAR | Status: DC | PRN
  Administered 2023-04-16: 4 mg via INTRAVENOUS

## 2023-04-16 MED ORDER — OXYCODONE-ACETAMINOPHEN 5-325 MG PO TABS
1.0000 | ORAL_TABLET | ORAL | 0 refills | Status: DC | PRN
Start: 2023-04-16 — End: 2023-12-26

## 2023-04-16 MED ORDER — LACTATED RINGERS IV SOLN: INTRAVENOUS | Status: DC

## 2023-04-16 MED ORDER — DEXAMETHASONE SODIUM PHOSPHATE 10 MG/ML IJ SOLN
INTRAMUSCULAR | Status: DC | PRN
  Administered 2023-04-16: 10 mg via INTRAVENOUS

## 2023-04-16 MED ORDER — CEFAZOLIN SODIUM-DEXTROSE 2-4 GM/100ML-% IV SOLN
INTRAVENOUS | Status: AC
  Filled 2023-04-16: qty 100

## 2023-04-16 MED ORDER — PHENYLEPHRINE 80 MCG/ML (10ML) SYRINGE FOR IV PUSH (FOR BLOOD PRESSURE SUPPORT)
PREFILLED_SYRINGE | INTRAVENOUS | Status: DC | PRN
  Administered 2023-04-16 (×4): 80 ug via INTRAVENOUS

## 2023-04-16 MED ORDER — 0.9 % SODIUM CHLORIDE (POUR BTL) OPTIME
TOPICAL | Status: DC | PRN
  Administered 2023-04-16: 1000 mL

## 2023-04-16 MED ORDER — BUPIVACAINE HCL (PF) 0.5 % IJ SOLN
INTRAMUSCULAR | Status: AC
  Filled 2023-04-16: qty 30

## 2023-04-16 MED ORDER — SUGAMMADEX SODIUM 200 MG/2ML IV SOLN
INTRAVENOUS | Status: DC | PRN
  Administered 2023-04-16: 200 mg via INTRAVENOUS

## 2023-04-16 SURGICAL SUPPLY — 52 items
ADH SKN CLS APL DERMABOND .7 (GAUZE/BANDAGES/DRESSINGS) ×1
BAG COUNTER SPONGE SURGICOUNT (BAG) ×1 IMPLANT
BAG SPNG CNTER NS LX DISP (BAG) ×1
BLADE CLIPPER SURG (BLADE) IMPLANT
CANISTER SUCT 3000ML PPV (MISCELLANEOUS) ×1 IMPLANT
CNTNR URN SCR LID CUP LEK RST (MISCELLANEOUS) IMPLANT
CONT SPEC 4OZ STRL OR WHT (MISCELLANEOUS) ×2
DERMABOND ADVANCED .7 DNX12 (GAUZE/BANDAGES/DRESSINGS) IMPLANT
DEVICE BIOPSY BONE KYPHX (INSTRUMENTS) IMPLANT
DEVICE DISSECT PLASMABLAD 3.0S (MISCELLANEOUS) ×1 IMPLANT
DRAPE C-ARM 42X72 X-RAY (DRAPES) ×2 IMPLANT
DRAPE LAPAROTOMY 100X72X124 (DRAPES) ×1 IMPLANT
DURAPREP 26ML APPLICATOR (WOUND CARE) ×1 IMPLANT
ELECT REM PT RETURN 9FT ADLT (ELECTROSURGICAL) ×1
ELECTRODE REM PT RTRN 9FT ADLT (ELECTROSURGICAL) ×1 IMPLANT
GAUZE 4X4 16PLY ~~LOC~~+RFID DBL (SPONGE) IMPLANT
GAUZE SPONGE 4X4 12PLY STRL (GAUZE/BANDAGES/DRESSINGS) ×1 IMPLANT
GLOVE BIOGEL PI IND STRL 8.5 (GLOVE) ×1 IMPLANT
GLOVE ECLIPSE 8.5 STRL (GLOVE) ×1 IMPLANT
GLOVE EXAM NITRILE XL STR (GLOVE) IMPLANT
GOWN STRL REUS W/ TWL LRG LVL3 (GOWN DISPOSABLE) ×1 IMPLANT
GOWN STRL REUS W/ TWL XL LVL3 (GOWN DISPOSABLE) ×1 IMPLANT
GOWN STRL REUS W/TWL 2XL LVL3 (GOWN DISPOSABLE) ×1 IMPLANT
GOWN STRL REUS W/TWL LRG LVL3 (GOWN DISPOSABLE) ×1
GOWN STRL REUS W/TWL XL LVL3 (GOWN DISPOSABLE) ×1
INTRODUCER DEVICE OSTEO LEVEL (INTRODUCER) IMPLANT
KIT BASIN OR (CUSTOM PROCEDURE TRAY) ×1 IMPLANT
KIT TURNOVER KIT B (KITS) ×1 IMPLANT
MARKER SKIN DUAL TIP RULER LAB (MISCELLANEOUS) ×1 IMPLANT
NDL HYPO 22X1.5 SAFETY MO (MISCELLANEOUS) ×1 IMPLANT
NEEDLE HYPO 22X1.5 SAFETY MO (MISCELLANEOUS) ×1 IMPLANT
NS IRRIG 1000ML POUR BTL (IV SOLUTION) ×1 IMPLANT
PACK LAMINECTOMY NEURO (CUSTOM PROCEDURE TRAY) ×1 IMPLANT
PAD ARMBOARD 7.5X6 YLW CONV (MISCELLANEOUS) ×1 IMPLANT
PLASMABLADE 3.0S (MISCELLANEOUS)
SPIKE FLUID TRANSFER (MISCELLANEOUS) ×1 IMPLANT
SPONGE SURGIFOAM ABS GEL SZ50 (HEMOSTASIS) ×1 IMPLANT
SPONGE T-LAP 4X18 ~~LOC~~+RFID (SPONGE) IMPLANT
STAPLER SKIN PROX WIDE 3.9 (STAPLE) IMPLANT
STRIP CLOSURE SKIN 1/2X4 (GAUZE/BANDAGES/DRESSINGS) IMPLANT
SUT VIC AB 0 CT1 18XCR BRD8 (SUTURE) ×1 IMPLANT
SUT VIC AB 0 CT1 8-18 (SUTURE)
SUT VIC AB 2-0 CP2 18 (SUTURE) ×1 IMPLANT
SUT VIC AB 3-0 SH 8-18 (SUTURE) ×1 IMPLANT
SUT VIC AB 4-0 RB1 18 (SUTURE) ×1 IMPLANT
SWAB COLLECTION DEVICE MRSA (MISCELLANEOUS) IMPLANT
SWAB CULTURE ESWAB REG 1ML (MISCELLANEOUS) IMPLANT
SYR CONTROL 10ML LL (SYRINGE) ×1 IMPLANT
TOWEL GREEN STERILE (TOWEL DISPOSABLE) ×1 IMPLANT
TOWEL GREEN STERILE FF (TOWEL DISPOSABLE) ×1 IMPLANT
TUBING FEATHERFLOW (TUBING) ×1 IMPLANT
WATER STERILE IRR 1000ML POUR (IV SOLUTION) ×1 IMPLANT

## 2023-04-16 NOTE — Transfer of Care (Signed)
Immediate Anesthesia Transfer of Care Note  Patient: [name] [name]  Procedure(s) Performed: Flouro guided bone biopsy of Lumbar Vertebral Body  Patient Location: PACU  Anesthesia Type:General  Level of Consciousness: awake, alert , and oriented  Airway & Oxygen Therapy: Patient Spontanous Breathing  Post-op Assessment: Report given to RN and Post -op Vital signs reviewed and stable  Post vital signs: Reviewed and stable  Last Vitals:  Vitals Value Taken Time  BP 101/82 04/16/23 1852  Temp    Pulse 80 04/16/23 1853  Resp 11 04/16/23 1853  SpO2 95 % 04/16/23 1853  Vitals shown include unfiled device data.  Last Pain:  Vitals:   04/16/23 1512  TempSrc: Oral         Complications: No notable events documented.

## 2023-04-16 NOTE — Interval H&P Note (Signed)
History and Physical Interval Note:  04/16/2023 5:33 PM  [name] [name]  has presented today for surgery, with the diagnosis of metastatic to bone.  The various methods of treatment have been discussed with the patient and family. After consideration of risks, benefits and other options for treatment, the patient has consented to  Procedure(s): Flouro guided bone biopsy of L3 (N/A) as a surgical intervention.  The patient's history has been reviewed, patient examined, no change in status, stable for surgery.  I have reviewed the patient's chart and labs.  Questions were answered to the patient's satisfaction.     Stefani Dama

## 2023-04-16 NOTE — Anesthesia Postprocedure Evaluation (Signed)
Anesthesia Post Note  Patient: [name] [name]  Procedure(s) Performed: Flouro guided bone biopsy of Lumbar Vertebral Body     Patient location during evaluation: PACU Anesthesia Type: General Level of consciousness: awake and alert Pain management: pain level controlled Vital Signs Assessment: post-procedure vital signs reviewed and stable Respiratory status: spontaneous breathing, nonlabored ventilation and respiratory function stable Cardiovascular status: stable and blood pressure returned to baseline Anesthetic complications: no   No notable events documented.  Last Vitals:  Vitals:   04/16/23 1900 04/16/23 1915  BP: 119/76 129/77  Pulse: 75 77  Resp: 10 11  Temp:  (!) 36.3 C  SpO2: 97% 97%    Last Pain:  Vitals:   04/16/23 1915  TempSrc:   PainSc: 0-No pain                 Beryle Lathe

## 2023-04-16 NOTE — Anesthesia Preprocedure Evaluation (Signed)
Anesthesia Evaluation  Patient identified by MRN, date of birth, ID band Patient awake    Reviewed: Allergy & Precautions, NPO status , Patient's Chart, lab work & pertinent test results  Airway Mallampati: II  TM Distance: >3 FB Neck ROM: Limited    Dental no notable dental hx. (+) Dental Advisory Given, Teeth Intact   Pulmonary shortness of breath, former smoker   Pulmonary exam normal        Cardiovascular + CAD   Rhythm:Regular Rate:Normal  Echo 09/21/2022 1. Left ventricular ejection fraction, by estimation, is 35 to 40%. Left ventricular ejection fraction by 3D volume is 39 %. The left ventricle has moderately decreased function. The left ventricle demonstrates global hypokinesis. There is mild left  ventricular hypertrophy. Left ventricular diastolic parameters are consistent with Grade I diastolic dysfunction (impaired relaxation).  2. Right ventricular systolic function is normal. The right ventricular size is normal. Tricuspid regurgitation signal is inadequate for assessing PA pressure.  3. The mitral valve is abnormal. Trivial mitral valve regurgitation.  4. The aortic valve is tricuspid. Aortic valve regurgitation is not visualized. Aortic valve sclerosis is present, with no evidence of aortic valve stenosis.  5. The inferior vena cava is normal in size with greater than 50% respiratory variability, suggesting right atrial pressure of 3 mmHg.   - Comparison echocardiogram 05/11/2011: LVEF 70.1%, trivial TR/TR, grade 1 diastolic dysfunction.    Cardiac Cath 08/09/2022  Ost LAD lesion is 15% stenosed.  Mid LAD lesion is 20% stenosed.  Dist Cx lesion is 90% stenosed.  Prox RCA lesion is 100% stenosed.  1st Mrg lesion is 40% stenosed.  1. Relatively unchanged burden of disease with chronic total occlusion of right coronary artery collateralized by the left system and bridging collaterals and a subtotally  occluded distal left circumflex which subtends a very small area.  2. RFR assessment of the first obtuse marginal was 0.92; medical management should be pursued. 3. LVEDP of 10 mmHg.  Recommendation: Medical management.    Neuro/Psych  Headaches PSYCHIATRIC DISORDERS  Depression     Neuromuscular disease CVA, No Residual Symptoms    GI/Hepatic hiatal hernia,GERD  Controlled,,(+) Hepatitis -, C  Endo/Other  negative endocrine ROS    Renal/GU negative Renal ROS     Musculoskeletal  (+) Arthritis , Osteoarthritis,    Abdominal Normal abdominal exam  (+)   Peds  Hematology negative hematology ROS (+)   Anesthesia Other Findings   Reproductive/Obstetrics                             Anesthesia Physical Anesthesia Plan  ASA: 3  Anesthesia Plan: General   Post-op Pain Management: Tylenol PO (pre-op)*   Induction: Intravenous  PONV Risk Score and Plan: 2 and Ondansetron, Dexamethasone and Treatment may vary due to age or medical condition  Airway Management Planned: Oral ETT  Additional Equipment: None  Intra-op Plan:   Post-operative Plan: Extubation in OR  Informed Consent: I have reviewed the patients History and Physical, chart, labs and discussed the procedure including the risks, benefits and alternatives for the proposed anesthesia with the patient or authorized representative who has indicated his/her understanding and acceptance.     Dental advisory given  Plan Discussed with: CRNA  Anesthesia Plan Comments: (PAT note written 09/25/2022 by Shonna Chock, PA-C.  )       Anesthesia Quick Evaluation

## 2023-04-16 NOTE — Op Note (Signed)
Date of surgery: 04/16/2023 Preoperative diagnosis: Vertebral body lesions L3-L4-L5 Postoperative diagnosis: Same Procedure: Vertebral body bone biopsies Surgeon: Barnett Abu Anesthesia: General endotracheal Indications: [name] [name] is a 73 year old individual who has had a decompression and fusion done about 8 years ago at L3-L5.  He has since developed cancer that has been treated for successfully.  Some unusual lesions showed up on an MRI that he was having done for back pain.  These were concerning for cancer and follow-up shows that there is been enlargement after several months.  A PET scan was done which shows only lesions in the vertebral bodies at L3-L4-L5.  The patient is now undergoing vertebral body biopsy for better diagnostic clarification.  Procedure: Patient was brought to the operating room supine on the stretcher.  After the smooth induction of general tracheal anesthesia, he was carefully turned prone.  The back was prepped with alcohol DuraPrep and draped in a sterile fashion and then fluoroscopy was used in AP and lateral projections to verify and localize an oblique  Trajectory to the pedicular entry site at L5.  The skin in this area was infiltrated with lidocaine with epinephrine and a small vertical stab incision was created a Jamshidi needle was then guided to the lateral aspect of the pedicle at L5 and bone penetration was performed the bone was penetrated under fluoroscopic guidance in AP and lateral projection and when the outer cortex was well penetrated the inner cannula was removed and a bone harvesting needle was inserted and tapped to obtain a core of the patient's cancellous bone.  As the core was removed and exposed onto a piece of Telfa there is noted to be some bloody pus that was present in the cannula along with the bone.  The pus was then cultured separately and sent as a separate specimen also.  Further aspiration yielded some bloody fluid which was sent.  At  this point the trocar was removed from the L5 vertebrae it was decided to also biopsy the L4 vertebrae similar trajectory was chosen.  L4 was entered just on the inferolateral aspect of the pedicle.  A second core of bone was obtained and sent for pathology.  Here there was some liquefied portion of the blood that was removed and this was also sent for sample and a bone marrow aspirate was performed here.  Once the aspirate was withdrawn the needle was withdrawn.  A singular stitch of 4-0 Vicryl was used in the subcuticular skin.  Patient tolerated procedure well was returned to recovery room in stable condition.

## 2023-04-16 NOTE — H&P (Signed)
[name] [name] is an 72 y.o. male.   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  In 2018 he underwent decompression and fusion L3-L5.  Postoperatively he had some chronic pain was slow to heal but ultimately formed a solid arthrodesis allowed him to return to normal function.  He was then diagnosed with a lung cancer and had undergone treatment for that process.  He is followed in the cancer center.  About 6 months ago he was developing increasing back pain and workup suggested the presence of some degenerative changes but could not rule out the possibility of metastatic disease in the lumbar spine.  It was suggested that this be followed radiographically and a month ago he underwent further workup with a repeat MRI which suggested the presence of advancing metastatic disease between L3 and L5.  He then underwent a PET scan which suggested that this was the only hypermetabolic area in the spine.  Because of the fact that his disease otherwise appears well-controlled is very unusual that we just fine this metabolic activity in the lumbar spine.  A biopsy has been suggested for further confirmation of the nature of the degenerative process that is occurring in the lumbar spine and to identify a both neoplasm and is involved with it.  Past Medical History:  Diagnosis Date   ADHD (attention deficit hyperactivity disorder)    Arthritis    Carpal tunnel syndrome    Right   Cataracts, bilateral    very mild   Coronary artery disease    Depression    Early cataracts, bilateral    Fatty liver    Gallstones    GERD (gastroesophageal reflux disease)    Headache 2011   HX OF VISUAL MIGRAINES - NO PAIN --MIGRAINES ATTRIBUTED TO PT CUTTING NICOTINE PATCH IN HALF BEFORE APPLYING TO HIS SKIN.     Hepatitis 2001   C  -treated and no problems since  2001 'cured'   History of hiatal hernia    patient denies this dx as of 04/13/23   History of kidney stones    found on CT Scan   HOH (hard of hearing)    wears hearing aids   lung ca dx'd 12/06/19   RIGHT Lower  LUNG   Mild cognitive impairment 12/19/2021   Muscle cramps    hands and legs   Osteopenia    Pain    KNEE OA   Pre-diabetes    diet controlled   Right lower lobe lung mass    Shortness of breath dyspnea    "SOMETIMES FEELS LIKE I CAN'T CATCH A DEEP BREATH "- ON GOING FOR YEARS - STATES MEDICAL DOCTOR SENT HIM FOR CARDIAC WORK UP 2011 - PT STATES HEART OK   Stroke (HCC)    HX POSSIBLE TIA - RT LEG NUMBNESS AND TINGLING FINGERS AND NUMBNESS RT FACE - ALL RESOLVED ; STATES NEUROLOGIST COULD NOT FIND ANY EVIDENCE OF STROKE - THOUGHT PT'S EPISODE MIGHT BE FROM CUTTING NICOTINE PATCH IN HALF AND APPLYING TO SKIN   Trigger finger    LEFT MIDDLE FINGER   Wears glasses     Past Surgical History:  Procedure Laterality Date   ANTERIOR CERVICAL DECOMP/DISCECTOMY FUSION N/A 09/26/2022   Procedure: Cervical four-five Anterior Cervical Decompression Fusion;  Surgeon: Barnett Abu, MD;  Location: [facility] OR;  Service:  Neurosurgery;  Laterality: N/A;   BRONCHIAL BIOPSY  02/24/2020   Procedure: BRONCHIAL BIOPSIES;  Surgeon: Leslye Peer, MD;  Location: [facility] ENDOSCOPY;  Service: Pulmonary;;   BRONCHIAL BRUSHINGS  02/24/2020   Procedure: BRONCHIAL BRUSHINGS;  Surgeon: Leslye Peer, MD;  Location: [facility] ENDOSCOPY;  Service: Pulmonary;;   BRONCHIAL NEEDLE ASPIRATION BIOPSY  02/24/2020   Procedure: BRONCHIAL NEEDLE ASPIRATION BIOPSIES;  Surgeon: Leslye Peer, MD;  Location: [facility] ENDOSCOPY;  Service: Pulmonary;;   BRONCHIAL WASHINGS  02/24/2020   Procedure: BRONCHIAL WASHINGS;  Surgeon: Leslye Peer, MD;  Location[facility]COPY;  Service: Pulmonary;;   COLONOSCOPY     CORONARY PRESSURE/FFR STUDY N/A 08/09/2022   Procedure: INTRAVASCULAR PRESSURE WIRE/FFR STUDY;   Surgeon: Orbie Pyo, MD;  Location: [facility] INVASIVE CV LAB;  Service: Cardiovascular;  Laterality: N/A;   INTERCOSTAL NERVE BLOCK Right 04/28/2020   Procedure: INTERCOSTAL NERVE BLOCK;  Surgeon: Corliss Skains, MD;  Location: [facility] OR;  Service: Thoracic;  Laterality: Right;   left ankle surgery     fractured   LEFT HEART CATH AND CORONARY ANGIOGRAPHY N/A 06/29/2021   Procedure: LEFT HEART CATH AND CORONARY ANGIOGRAPHY;  Surgeon: Orbie Pyo, MD;  Location: [facility] INVASIVE CV LAB;  Service: Cardiovascular;  Laterality: N/A;   LEFT HEART CATH AND CORONARY ANGIOGRAPHY N/A 08/09/2022   Procedure: LEFT HEART CATH AND CORONARY ANGIOGRAPHY;  Surgeon: Orbie Pyo, MD;  Location: [facility] INVASIVE CV LAB;  Service: Cardiovascular;  Laterality: N/A;   left knee surgery     MENISCAL REMOVED   left shoulder     torn ligaments left shoulder   LUMBAR FUSION     L3 L4 L5   NODE DISSECTION Right 04/28/2020   Procedure: NODE DISSECTION;  Surgeon: Corliss Skains, MD;  Location: [facility] OR;  Service: Thoracic;  Laterality: Right;   RIGHT CARPAL TUNNEL RELEASE  2011   RIGHT KNEE ARTHROSCOPY   AUG OR SEPT 2015   AT [facility]   ROOT CANAL     ROTATOR CUFF REPAIR Left    2007   THORACOSCOPY Right 04/28/2020   XI ROBOTIC ASSISTED THORASCOPY-LOBECTOMY (Right)   TONSILLECTOMY     as child   TOTAL KNEE ARTHROPLASTY Left 07/14/2013   Procedure: LEFT TOTAL KNEE ARTHROPLASTY;  Surgeon: Shelda Pal, MD;  Location: [facility] ORS;  Service: Orthopedics;  Laterality: Left;   TOTAL KNEE ARTHROPLASTY Right 07/21/2014   Procedure: RIGHT TOTAL KNEE ARTHROPLASTY;  Surgeon: Shelda Pal, MD;  Location: [facility] ORS;  Service: Orthopedics;  Laterality: Right;   TOTAL SHOULDER ARTHROPLASTY Right 10/17/2019   Procedure: RIGHT REVERSE TOTAL SHOULDER REPLACEMENT;  Surgeon: Beverely Low, MD;  Location: [facility] ORS;  Service: Orthopedics;  Laterality: Right;  interscalene block   TRIGGER FINGER RELEASE Left    VIDEO  BRONCHOSCOPY WITH ENDOBRONCHIAL NAVIGATION Right 02/24/2020   Procedure: VIDEO BRONCHOSCOPY WITH ENDOBRONCHIAL NAVIGATION;  Surgeon: Leslye Peer, MD;  Location: [facility] ENDOSCOPY;  Service: Pulmonary;  Laterality: Right;   WEDGE RESECTION Right 04/28/2020   XI ROBOTIC ASSISTED THORASCOPY-RIGHT LOWER LOBE WEDGE RESECTION (Right Chest)     Family History  Problem Relation Age of Onset   Alcohol abuse Father    Hyperlipidemia Father    Heart attack Brother 33   Hyperlipidemia Brother    Hyperlipidemia Sister        3 sisters   Heart attack Brother    Hyperlipidemia Brother    Social History:  reports that he quit smoking about 10  years ago. His smoking use included cigarettes. He has never used smokeless tobacco. He reports that he does not currently use alcohol after a past usage of about 7.0 standard drinks of alcohol per week. He reports that he does not currently use drugs after having used the following drugs: Marijuana.  Allergies:  Allergies  Allergen Reactions   Statins Other (See Comments)    MYALGIAS   Chantix [Varenicline] Other (See Comments)    Ineffective; patient did not like the way it made him feel   Diclofenac Sodium Other (See Comments)   Tricor [Fenofibrate] Other (See Comments)    ineffective for cholesterol (2014)   Zetia [Ezetimibe] Other (See Comments)    ineffective for cholesterol   Rosuvastatin Calcium Other (See Comments)    Muscle pain    No medications prior to admission.    No results found for this or any previous visit (from the past 48 hour(s)). No results found.  Review of Systems  Constitutional:  Positive for activity change.  Respiratory:         Hx of lung Cancer  Musculoskeletal:  Positive for back pain.  All other systems reviewed and are negative.   There were no vitals taken for this visit. Physical Exam Constitutional:      Appearance: Normal appearance.  HENT:     Head: Normocephalic and atraumatic.     Right Ear:  Tympanic membrane normal.     Left Ear: Tympanic membrane normal.     Nose: Nose normal.     Mouth/Throat:     Mouth: Mucous membranes are moist.     Pharynx: Oropharynx is clear.  Eyes:     Extraocular Movements: Extraocular movements intact.     Conjunctiva/sclera: Conjunctivae normal.     Pupils: Pupils are equal, round, and reactive to light.  Cardiovascular:     Rate and Rhythm: Normal rate and regular rhythm.     Pulses: Normal pulses.     Heart sounds: Normal heart sounds.  Pulmonary:     Effort: Pulmonary effort is normal.     Breath sounds: Normal breath sounds.  Abdominal:     General: Abdomen is flat. Bowel sounds are normal.     Palpations: Abdomen is soft.  Musculoskeletal:        General: Normal range of motion.     Cervical back: Normal range of motion.  Skin:    Capillary Refill: Capillary refill takes less than 2 seconds.  Neurological:     General: No focal deficit present.     Mental Status: He is alert.  Psychiatric:        Behavior: Behavior normal.        Thought Content: Thought content normal.        Judgment: Judgment normal.      Assessment/Plan Metastatic disease to L3-L5.  History of lung cancer.  History of fusion L3-L5.  Plan: Percutaneous bone biopsy of L3 and L4.  Stefani Dama, MD 04/16/2023, 1:13 PM

## 2023-04-16 NOTE — Anesthesia Procedure Notes (Signed)
Procedure Name: Intubation Date/Time: 04/16/2023 5:53 PM  Performed by: Dairl Ponder, CRNAPre-anesthesia Checklist: Patient identified, Emergency Drugs available, Suction available and Patient being monitored Patient Re-evaluated:Patient Re-evaluated prior to induction Oxygen Delivery Method: Circle System Utilized Preoxygenation: Pre-oxygenation with 100% oxygen Induction Type: IV induction Ventilation: Mask ventilation without difficulty Laryngoscope Size: Mac and 4 Grade View: Grade I Tube type: Oral Tube size: 7.5 mm Number of attempts: 1 Airway Equipment and Method: Stylet and Oral airway Placement Confirmation: ETT inserted through vocal cords under direct vision, positive ETCO2 and breath sounds checked- equal and bilateral Secured at: 24 cm Tube secured with: Tape Dental Injury: Teeth and Oropharynx as per pre-operative assessment

## 2023-04-16 NOTE — Discharge Summary (Signed)
Physician Discharge Summary  Patient ID: [name] [name] MRN: [id number] DOB/AGE: Jul 17, 1951 72 y.o.  Admit date: 04/16/2023 Discharge date: 04/16/2023  Admission Diagnoses: Metastatic bone lesions L3-L4-L5  Discharge Diagnoses: Metastatic bone lesions L3-L4-L5 Active Problems:   * No active hospital problems. *   Discharged Condition: Good  Hospital Course: Patient was admitted to undergo bone biopsy at L3-L4-L5.  He underwent biopsies of L4 and L5 and he tolerated it well.  Consults: None  Significant Diagnostic Studies: None  Treatments: surgery: See op note  Discharge Exam: Blood pressure 129/77, pulse 77, temperature (!) 97.3 F (36.3 C), resp. rate 11, height 6\' 1"  (1.854 m), weight 88.5 kg, SpO2 97%. Incisions clean and dry motor function is intact  Disposition: Discharge disposition: 01-Home or Self Care       Discharge Instructions     Call MD for:  redness, tenderness, or signs of infection (pain, swelling, redness, odor or green/yellow discharge around incision site)   Complete by: As directed    Call MD for:  severe uncontrolled pain   Complete by: As directed    Call MD for:  temperature >100.4   Complete by: As directed    Diet - low sodium heart healthy   Complete by: As directed    Incentive spirometry RT   Complete by: As directed    Increase activity slowly   Complete by: As directed       Allergies as of 04/16/2023       Reactions   Statins Other (See Comments)   MYALGIAS   Chantix [varenicline] Other (See Comments)   Ineffective; patient did not like the way it made him feel   Diclofenac Sodium Other (See Comments)   Tricor [fenofibrate] Other (See Comments)   ineffective for cholesterol (2014)   Zetia [ezetimibe] Other (See Comments)   ineffective for cholesterol   Rosuvastatin Calcium Other (See Comments)   Muscle pain        Medication List     STOP taking these medications    aspirin EC 81 MG tablet   Magnesium 500 MG  Tabs       TAKE these medications    acetaminophen 500 MG tablet Commonly known as: TYLENOL Take 1,000 mg by mouth every 6 (six) hours as needed for moderate pain or mild pain.   atomoxetine 40 MG capsule Commonly known as: STRATTERA Take 40 mg by mouth daily.   clindamycin 150 MG capsule Commonly known as: CLEOCIN Take 600 mg by mouth See admin instructions. Take 4 capsules (600 mg) by mouth prior to dental appointments   ibuprofen 200 MG tablet Commonly known as: ADVIL Take 600 mg by mouth every 6 (six) hours as needed (pain.).   loratadine 10 MG tablet Commonly known as: CLARITIN Take 10 mg by mouth daily.   oxyCODONE-acetaminophen 5-325 MG tablet Commonly known as: Percocet Take 1 tablet by mouth every 4 (four) hours as needed for severe pain.   Repatha SureClick 140 MG/ML Soaj Generic drug: Evolocumab Inject 140 mg into the skin every 14 (fourteen) days.   sertraline 100 MG tablet Commonly known as: ZOLOFT Take 100 mg by mouth in the morning.   tamsulosin 0.4 MG Caps capsule Commonly known as: FLOMAX Take 0.4 mg by mouth in the morning.         Signed: Shary Key  04/16/2023, 7:21 PM

## 2023-04-20 ENCOUNTER — Inpatient Hospital Stay: Payer: Medicare Other | Attending: Internal Medicine

## 2023-04-20 ENCOUNTER — Other Ambulatory Visit: Payer: Self-pay

## 2023-04-20 ENCOUNTER — Ambulatory Visit (HOSPITAL_BASED_OUTPATIENT_CLINIC_OR_DEPARTMENT_OTHER): Payer: Medicare Other | Admitting: Internal Medicine

## 2023-04-20 DIAGNOSIS — M545 Low back pain, unspecified: Secondary | ICD-10-CM | POA: Insufficient documentation

## 2023-04-20 DIAGNOSIS — Z9221 Personal history of antineoplastic chemotherapy: Secondary | ICD-10-CM | POA: Diagnosis not present

## 2023-04-20 DIAGNOSIS — Z85118 Personal history of other malignant neoplasm of bronchus and lung: Secondary | ICD-10-CM | POA: Diagnosis present

## 2023-04-20 DIAGNOSIS — C3490 Malignant neoplasm of unspecified part of unspecified bronchus or lung: Secondary | ICD-10-CM

## 2023-04-20 LAB — CMP (CANCER CENTER ONLY)
ALT: 22 U/L (ref 0–44)
AST: 29 U/L (ref 15–41)
Albumin: 4.1 g/dL (ref 3.5–5.0)
Alkaline Phosphatase: 88 U/L (ref 38–126)
Anion gap: 8 (ref 5–15)
BUN: 21 mg/dL (ref 8–23)
CO2: 27 mmol/L (ref 22–32)
Calcium: 9.2 mg/dL (ref 8.9–10.3)
Chloride: 102 mmol/L (ref 98–111)
Creatinine: 1.22 mg/dL (ref 0.61–1.24)
GFR, Estimated: >60 mL/min (ref >60)
Glucose, Bld: 137 mg/dL — ABNORMAL HIGH (ref 70–99)
Potassium: 3.6 mmol/L (ref 3.5–5.1)
Sodium: 137 mmol/L (ref 135–145)
Total Bilirubin: 0.8 mg/dL (ref 0.3–1.2)
Total Protein: 6.9 g/dL (ref 6.5–8.1)

## 2023-04-20 LAB — CBC WITH DIFFERENTIAL (CANCER CENTER ONLY)
Abs Immature Granulocytes: 0.01 10*3/uL (ref 0.00–0.07)
Basophils Absolute: 0 10*3/uL (ref 0.0–0.1)
Basophils Relative: 1 %
Eosinophils Absolute: 0.1 10*3/uL (ref 0.0–0.5)
Eosinophils Relative: 3 %
HCT: 41.2 % (ref 39.0–52.0)
Hemoglobin: 14.3 g/dL (ref 13.0–17.0)
Immature Granulocytes: 0 %
Lymphocytes Relative: 20 %
Lymphs Abs: 0.6 10*3/uL — ABNORMAL LOW (ref 0.7–4.0)
MCH: 30.6 pg (ref 26.0–34.0)
MCHC: 34.7 g/dL (ref 30.0–36.0)
MCV: 88.2 fL (ref 80.0–100.0)
Monocytes Absolute: 0.3 10*3/uL (ref 0.1–1.0)
Monocytes Relative: 12 %
Neutro Abs: 1.8 10*3/uL (ref 1.7–7.7)
Neutrophils Relative %: 64 %
Platelet Count: 119 10*3/uL — ABNORMAL LOW (ref 150–400)
RBC: 4.67 MIL/uL (ref 4.22–5.81)
RDW: 12.9 % (ref 11.5–15.5)
WBC Count: 2.8 10*3/uL — ABNORMAL LOW (ref 4.0–10.5)
nRBC: 0 % (ref 0.0–0.2)

## 2023-04-23 ENCOUNTER — Encounter: Payer: Self-pay | Admitting: Neurology

## 2023-04-24 ENCOUNTER — Inpatient Hospital Stay (HOSPITAL_BASED_OUTPATIENT_CLINIC_OR_DEPARTMENT_OTHER): Payer: Medicare Other | Admitting: Internal Medicine

## 2023-04-24 ENCOUNTER — Other Ambulatory Visit: Payer: Self-pay

## 2023-04-24 VITALS — BP 111/69 | HR 88 | Temp 97.9°F | Resp 16 | Ht 73.0 in | Wt 195.8 lb

## 2023-04-24 DIAGNOSIS — C3490 Malignant neoplasm of unspecified part of unspecified bronchus or lung: Secondary | ICD-10-CM | POA: Diagnosis not present

## 2023-04-24 DIAGNOSIS — Z85118 Personal history of other malignant neoplasm of bronchus and lung: Secondary | ICD-10-CM | POA: Diagnosis not present

## 2023-04-24 NOTE — Progress Notes (Signed)
[facility] Telephone:([phone]   [phone]  OFFICE PROGRESS NOTE  Blair Heys, MD 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   PRIOR THERAPY:   1) Status post right lower lobectomy with lymph node dissection under the care of Dr. Cliffton Asters on 04/28/2020. 2)  Adjuvant systemic chemotherapy with cisplatin 75 mg/m2 and Alimta 500 mg/mm IV every 3 weeks.  First dose expected on 06/16/2020.  Status post 4 cycles.   CURRENT THERAPY:  Observation.  INTERVAL HISTORY: [name] [name] 72 y.o. male returns to the clinic today for follow-up visit.  The patient is feeling fine today with no concerning complaints except for the low back pain.  He had a PET scan performed on April 01, 2023 and that showed no evidence for recurrent disease in the chest or abdomen but there was new hypermetabolic activity within the lumbar spine with new osseous lucency status post previous L3-5 PLIF that was concerning for metastatic disease.  The patient was seen by Dr. Danielle Dess and he underwent biopsy of this lesion and it was negative for malignancy.  He denied having any current shortness of breath, cough or hemoptysis but has occasional left-sided chest pain.  He has no nausea, vomiting, diarrhea or constipation.  He has no headache or visual changes.  He is here today for evaluation and recommendation regarding his condition.    MEDICAL HISTORY: Past Medical History:  Diagnosis Date   ADHD (attention deficit hyperactivity disorder)    Arthritis    Carpal tunnel syndrome    Right   Cataracts, bilateral    very mild   Coronary artery disease    Depression    Early cataracts, bilateral    Fatty liver    Gallstones    GERD (gastroesophageal reflux disease)    Headache 2011   HX OF VISUAL MIGRAINES - NO  PAIN --MIGRAINES ATTRIBUTED TO PT CUTTING NICOTINE PATCH IN HALF BEFORE APPLYING TO HIS SKIN.     Hepatitis 2001   C -treated and no problems since  2001 'cured'   History of hiatal hernia    patient denies this dx as of 04/13/23   History of kidney stones    found on CT Scan   HOH (hard of hearing)    wears hearing aids   lung ca dx'd 12/06/19   RIGHT Lower  LUNG   Mild cognitive impairment 12/19/2021   Muscle cramps    hands and legs   Osteopenia    Pain    KNEE OA   Pre-diabetes    diet controlled   Right lower lobe lung mass    Shortness of breath dyspnea    "SOMETIMES FEELS LIKE I CAN'T CATCH A DEEP BREATH "- ON GOING FOR YEARS - STATES MEDICAL DOCTOR SENT HIM FOR CARDIAC WORK UP 2011 - PT STATES HEART OK   Stroke (HCC)    HX POSSIBLE TIA - RT LEG NUMBNESS AND TINGLING FINGERS AND NUMBNESS RT FACE - ALL RESOLVED ; STATES NEUROLOGIST COULD NOT FIND ANY EVIDENCE OF STROKE - THOUGHT PT'S EPISODE MIGHT BE FROM CUTTING NICOTINE PATCH IN HALF AND APPLYING TO SKIN   Trigger finger    LEFT MIDDLE FINGER   Wears glasses     ALLERGIES:  is allergic to statins, chantix [varenicline], diclofenac sodium, tricor [fenofibrate], zetia [  ezetimibe], and rosuvastatin calcium.  MEDICATIONS:  Current Outpatient Medications  Medication Sig Dispense Refill   acetaminophen (TYLENOL) 500 MG tablet Take 1,000 mg by mouth every 6 (six) hours as needed for moderate pain or mild pain.     atomoxetine (STRATTERA) 40 MG capsule Take 40 mg by mouth daily.     clindamycin (CLEOCIN) 150 MG capsule Take 600 mg by mouth See admin instructions. Take 4 capsules (600 mg) by mouth prior to dental appointments     Evolocumab (REPATHA SURECLICK) 140 MG/ML SOAJ Inject 140 mg into the skin every 14 (fourteen) days. 6 mL 3   ibuprofen (ADVIL) 200 MG tablet Take 600 mg by mouth every 6 (six) hours as needed (pain.).     loratadine (CLARITIN) 10 MG tablet Take 10 mg by mouth daily.     oxyCODONE-acetaminophen (PERCOCET)  5-325 MG tablet Take 1 tablet by mouth every 4 (four) hours as needed for severe pain. 20 tablet 0   sertraline (ZOLOFT) 100 MG tablet Take 100 mg by mouth in the morning.     tamsulosin (FLOMAX) 0.4 MG CAPS capsule Take 0.4 mg by mouth in the morning.     No current facility-administered medications for this visit.    SURGICAL HISTORY:  Past Surgical History:  Procedure Laterality Date   ANTERIOR CERVICAL DECOMP/DISCECTOMY FUSION N/A 09/26/2022   Procedure: Cervical four-five Anterior Cervical Decompression Fusion;  Surgeon: Barnett Abu, MD;  [facility];  Service: Neurosurgery;  Laterality: N/A;   BRONCHIAL BIOPSY  02/24/2020   Procedure: BRONCHIAL BIOPSIES;  Surgeon: Leslye Peer, MD;  Location: [facility] ENDOSCOPY;  Service: Pulmonary;;   BRONCHIAL BRUSHINGS  02/24/2020   Procedure: BRONCHIAL BRUSHINGS;  Surgeon: Leslye Peer, MD;  Location: [facility] ENDOSCOPY;  Service: Pulmonary;;   BRONCHIAL NEEDLE ASPIRATION BIOPSY  02/24/2020   Procedure: BRONCHIAL NEEDLE ASPIRATION BIOPSIES;  Surgeon: Leslye Peer, MD;  Location: [facility] ENDOSCOPY;  Service: Pulmonary;;   BRONCHIAL WASHINGS  02/24/2020   Procedure: BRONCHIAL WASHINGS;  Surgeon: Leslye Peer, MD;  Location: [facility] ENDOSCOPY;  Service: Pulmonary;;   COLONOSCOPY     CORONARY PRESSURE/FFR STUDY N/A 08/09/2022   Procedure: INTRAVASCULAR PRESSURE WIRE/FFR STUDY;  Surgeon: Orbie Pyo, MD;  [facility] INVAS[facility] CV LAB;  Service: Cardiovascular;  Laterality: N/A;   INTERCOSTAL NERVE BLOCK Right 04/28/2020   Procedure: INTERCOSTAL NERVE BLOCK;  Surgeon: Corliss Skains, MD;  [facility] OR;  Service: Thoracic;  Laterality: Right;   left ankle surgery     fractured   LEFT HEART CATH AND CORONARY ANGIOGRAPHY N/A 06/29/2021   Procedure: LEFT HEART CATH AND CORONARY ANGIOGRAPHY;  Surgeon: Orbie Pyo, MD;  [facility] INVAS[facility] CV LAB;  Service: Cardiovascular;  Laterality: N/A;   LEFT HEART CATH AND CORONARY ANGIOGRAPHY N/A  08/09/2022   Procedure: LEFT HEART CATH AND CORONARY ANGIOGRAPHY;  Surgeon: Orbie Pyo, MD;  [facility] INVAS[facility] CV LAB;  Service: Cardiovascular;  Laterality: N/A;   left knee surgery     MENISCAL REMOVED   left shoulder     torn ligaments left shoulder   LUMBAR FUSION     L3 L4 L5   NODE DISSECTION Right 04/28/2020   Procedure: NODE DISSECTION;  Surgeon: Corliss Skains, MD;  [facility] OR;  Service: Thoracic;  Laterality: Right;   RIGHT CARPAL TUNNEL RELEASE  2011   RIGHT KNEE ARTHROSCOPY   AUG OR SEPT 2015   AT [facility]   ROOT CANAL  ROTATOR CUFF REPAIR Left    2007   THORACOSCOPY Right 04/28/2020   XI ROBOTIC ASSISTED THORASCOPY-LOBECTOMY (Right)   TONSILLECTOMY     as child   TOTAL KNEE ARTHROPLASTY Left 07/14/2013   Procedure: LEFT TOTAL KNEE ARTHROPLASTY;  Surgeon: Shelda Pal, MD;  [facility]: [facility] ORS;  Service: Orthopedics;  Laterality: Left;   TOTAL KNEE ARTHROPLASTY Right 07/21/2014   Procedure: RIGHT TOTAL KNEE ARTHROPLASTY;  Surgeon: Shelda Pal, MD;  Location: [facility] ORS;  Service: Orthopedics;  Laterality: Right;   TOTAL SHOULDER ARTHROPLASTY Right 10/17/2019   Procedure: RIGHT REVERSE TOTAL SHOULDER REPLACEMENT;  Surgeon: Beverely Low, MD;  Location: [facility] ORS;  Service: Orthopedics;  Laterality: Right;  interscalene block   TRIGGER FINGER RELEASE Left    VIDEO BRONCHOSCOPY WITH ENDOBRONCHIAL NAVIGATION Right 02/24/2020   Procedure: VIDEO BRONCHOSCOPY WITH ENDOBRONCHIAL NAVIGATION;  Surgeon: Leslye Peer, MD;  Location: [facility] ENDOSCOPY;  Service: Pulmonary;  Laterality: Right;   WEDGE RESECTION Right 04/28/2020   XI ROBOTIC ASSISTED THORASCOPY-RIGHT LOWER LOBE WEDGE RESECTION (Right Chest)     REVIEW OF SYSTEMS:  Constitutional: positive for fatigue Eyes: negative Ears, nose, mouth, throat, and face: negative Respiratory: positive for pleurisy/chest pain Cardiovascular: negative Gastrointestinal:  negative Genitourinary:negative Integument/breast: negative Hematologic/lymphatic: negative Musculoskeletal:positive for back pain Neurological: negative Behavioral/Psych: negative Endocrine: negative Allergic/Immunologic: negative   PHYSICAL EXAMINATION: General appearance: alert, cooperative, fatigued, and no distress Head: Normocephalic, without obvious abnormality, atraumatic Neck: no adenopathy, no JVD, supple, symmetrical, trachea midline, and thyroid not enlarged, symmetric, no tenderness/mass/nodules Lymph nodes: Cervical, supraclavicular, and axillary nodes normal. Resp: clear to auscultation bilaterally Back: symmetric, no curvature. ROM normal. No CVA tenderness. Cardio: regular rate and rhythm, S1, S2 normal, no murmur, click, rub or gallop GI: soft, non-tender; bowel sounds normal; no masses,  no organomegaly Extremities: extremities normal, atraumatic, no cyanosis or edema Neurologic: Alert and oriented X 3, normal strength and tone. Normal symmetric reflexes. Normal coordination and gait  ECOG PERFORMANCE STATUS: 1 - Symptomatic but completely ambulatory   Blood pressure 111/69, pulse 88, temperature 97.9 F (36.6 C), temperature source Oral, resp. rate 16, height 6\' 1"  (1.854 m), weight 195 lb 12.8 oz (88.8 kg), SpO2 100%.  LABORATORY DATA: Lab Results  Component Value Date   WBC 2.8 (L) 04/20/2023   HGB 14.3 04/20/2023   HCT 41.2 04/20/2023   MCV 88.2 04/20/2023   PLT 119 (L) 04/20/2023      Chemistry      Component Value Date/Time   NA 137 04/20/2023 1001   NA 139 11/10/2022 1259   K 3.6 04/20/2023 1001   CL 102 04/20/2023 1001   CO2 27 04/20/2023 1001   BUN 21 04/20/2023 1001   BUN 22 11/10/2022 1259   CREATININE 1.22 04/20/2023 1001      Component Value Date/Time   CALCIUM 9.2 04/20/2023 1001   ALKPHOS 88 04/20/2023 1001   AST 29 04/20/2023 1001   ALT 22 04/20/2023 1001   BILITOT 0.8 04/20/2023 1001       RADIOGRAPHIC STUDIES: DG Lumbar  Spine 2-3 Views  Result Date: 04/16/2023 CLINICAL DATA:  Elective surgery. Bone biopsy of lumbar vertebral body. EXAM: LUMBAR SPINE - 2-3 VIEW COMPARISON:  None Available. FINDINGS: Five fluoroscopic spot views of the lumbar spine obtained in the operating room. L3-L5 posterior lumbar fusion hardware. Surgical instruments project over the L5 and L4 vertebral bodies. Fluoroscopy time 48 seconds. Dose 31.41 mGy. IMPRESSION: Intraoperative fluoroscopy. Electronically Signed   By: Ivette Loyal.D.  On: 04/16/2023 20:10   DG C-Arm 1-60 Min-No Report  Result Date: 04/16/2023 Fluoroscopy was utilized by the requesting physician.  No radiographic interpretation.   NM PET Image Restage (PS) Skull Base to Thigh (F-18 FDG)  Result Date: 04/05/2023 CLINICAL DATA:  Subsequent treatment strategy for non-small cell lung cancer. Abnormal lumbar MRI. EXAM: NUCLEAR MEDICINE PET SKULL BASE TO THIGH TECHNIQUE: 9.83 mCi F-18 FDG was injected intravenously. Full-ring PET imaging was performed from the skull base to thigh after the radiotracer. CT data was obtained and used for attenuation correction and anatomic localization. Fasting blood glucose: 100 mg/dl COMPARISON:  Chest CT 47/42/5956. PET-CT 03/10/2020. CT lumbar spine 12/21/2022. Lumbar MRI 03/20/2023 and 12/12/2022. FINDINGS: Mediastinal blood pool activity: SUV max 1.8 NECK: No hypermetabolic cervical lymph nodes are identified. No suspicious activity identified within the pharyngeal mucosal space. Incidental CT findings: Bilateral carotid atherosclerosis. CHEST: There are no hypermetabolic mediastinal, hilar or axillary lymph nodes. No hypermetabolic pulmonary activity or suspicious nodularity. Incidental CT findings: Status post right lower lobectomy. Previously demonstrated small nodule posteriorly at the right lung base is not well visualized, although is grossly stable on image 93/4, without hypermetabolic activity. Mild centrilobular emphysema.  Atherosclerosis of the aorta, great vessels and coronary arteries. ABDOMEN/PELVIS: There is no hypermetabolic activity within the liver, adrenal glands, spleen or pancreas. There is no hypermetabolic nodal activity in the abdomen or pelvis. Incidental CT findings: Aortic and branch vessel atherosclerosis without evidence of aneurysm. Small gallstones are noted. 5.1 cm cyst in the central portion of the right kidney without hypermetabolic activity; no follow-up imaging recommended. Prominent stool throughout the colon. Mild prostatomegaly. SKELETON: There are grossly stable postsurgical changes from previous L3-5 posterior lumbar and interbody fusion (S1 transitional). However, there is new hypermetabolic activity at the operative levels, especially within the L3 and L4 vertebral bodies (SUV max 11.8). Multiple intraosseous lucencies have developed from the previous MRI, as seen on interval lumbar spine imaging. No paraspinal inflammatory changes are identified. No other osseous activity suspicious for metastatic disease. Incidental CT findings: Previous cervical fusion and right shoulder reverse arthroplasty. IMPRESSION: 1. Status post right lower lobectomy. No evidence of local recurrence or extraosseous metastatic disease. 2. New hypermetabolic activity within the lumbar spine with new osseous lucencies status post previous L3-5 PLIF, as seen on recent lumbar spine imaging. No definite signs of acute inflammation or hardware loosening. Findings are concerning for metastatic disease or multiple myeloma. Tissue sampling recommended. 3. No other evidence of metastatic disease. 4. Aortic Atherosclerosis (ICD10-I70.0) and Emphysema (ICD10-J43.9). Electronically Signed   By: Carey Bullocks M.D.   On: 04/05/2023 10:52     ASSESSMENT AND PLAN: This is a very pleasant 72 years old white male with a stage IIIa non-small cell lung cancer, mucinous adenocarcinoma status post right lower lobectomy with lymph node  dissection in August 2021. The patient underwent adjuvant systemic chemotherapy with cisplatin and Alimta status post 4 cycles. The patient tolerated the previous 4 cycles of his treatment well with no concerning adverse effect except for fatigue. The patient has been on observation since that time and he is feeling fine with no concerning complaints except for the recent low back pain.  He was found to have a suspicious lesion at the L3 by his neurosurgeon. The patient had CT of the lumbar spine performed earlier today.  I personally and independently reviewed the imaging studies with the patient and his wife.  The scan showed bubbly lytic lesion at L3 and L5 bodies centered on  the left L3 and bilateral L5 pedicle screws and this pattern are suggestive of reactive process to the pedicle screws but of course with his history of lung cancer metastatic disease or multiple myeloma could not be completely excluded. A PET scan on April 01, 2023 showed no concerning finding for disease recurrence but there was hypermetabolic activity in the L3-5 concerning for metastatic disease.  The patient underwent biopsy under the care of Dr. Danielle Dess and it was negative for malignancy. I had a lengthy discussion with the patient today about his condition and further evaluation of his condition. In the absence of any malignancy, I will see him back for follow-up visit in 6 months for evaluation with repeat CT scan of the chest for restaging of his disease. The patient was advised to call immediately if he has any other concerning issues in the interval.  The patient voices understanding of current disease status and treatment options and is in agreement with the current care plan.  All questions were answered. The patient knows to call the clinic with any problems, questions or concerns. We can certainly see the patient much sooner if necessary.  Disclaimer: This note was dictated with voice recognition software. Similar  sounding words can inadvertently be transcribed and may not be corrected upon review.

## 2023-04-24 NOTE — Addendum Note (Signed)
Addended by: Princella Ion on: 04/24/2023 04:04 PM   Modules accepted: Orders

## 2023-04-27 ENCOUNTER — Telehealth: Payer: Self-pay | Admitting: Radiology

## 2023-04-27 NOTE — Telephone Encounter (Signed)
I called [name] [name] today to follow-up after our initial consult. Biopsy of the lumbar spine showed no evidence of malignancy. He had met with Dr. Arbutus Ped on 04/24/23 to review these results. I reiterated that there is no role for radiation at this time. If his symptoms do worsen prior to his next follow-up, he was advised to call back immediately. He expressed understanding of this plan and was appreciative of the call. He is scheduled to see Dr. Danielle Dess on 05/18/23 and Dr. Arbutus Ped in 6 months.    Joyice Faster, PA-C

## 2023-05-18 ENCOUNTER — Encounter: Payer: Self-pay | Admitting: Internal Medicine

## 2023-05-18 DIAGNOSIS — I2510 Atherosclerotic heart disease of native coronary artery without angina pectoris: Secondary | ICD-10-CM

## 2023-05-18 DIAGNOSIS — I5022 Chronic systolic (congestive) heart failure: Secondary | ICD-10-CM

## 2023-05-21 ENCOUNTER — Telehealth: Payer: Self-pay

## 2023-05-21 NOTE — Telephone Encounter (Signed)
Called me and left voicemail, returned his call; he would like to attend PREP now; have asked for another referral; will call him back Friday or Monday to set up assessment visit so he can attend 9/23 class at [place] [facility].

## 2023-05-25 ENCOUNTER — Telehealth: Payer: Self-pay

## 2023-05-29 NOTE — Progress Notes (Signed)
[facility] PREP Evaluation  Patient Details  Name: [name] [name] MRN: [id number] Date of Birth: 1950/12/10 Age: 72 y.o. PCP: Blair Heys, MD  Vitals:   05/29/23 1024  BP: (!) 90/50  Pulse: 99  SpO2: 97%  Weight: 196 lb (88.9 kg)     [facility] Eval - 05/29/23 1000       [facility] "PREP" Location   [facility] "PREP" Location [name]      Referral    Referring Provider Walker    Reason for referral Heart Failure;High Cholesterol;Inactivity;Cancer    Program Start Date 06/12/23      Measurement   Waist Circumference 40 inches    Hip Circumference 40 inches    Body fat 25.9 percent      Information for Trainer   Goals --   Lose 5 pounds by end of program; build strength/stamina; establish strength training routine   Current Exercise none    Orthopedic Concerns --   Bilat TKA; R shoulder replacement; Lumbar fusion L3-5; L shoulder arthritis   Pertinent Medical History --   CHF, s/p lung CA w/lobectomy; dyslipidemia     Timed Up and Go (TUGS)   Timed Up and Go Low risk <9 seconds      Mobility and Daily Activities   I find it easy to walk up or down two or more flights of stairs. 3    I have no trouble taking out the trash. 4    I do housework such as vacuuming and dusting on my own without difficulty. 4    I can easily lift a gallon of milk (8lbs). 4    I can easily walk a mile. 1    I have no trouble reaching into high cupboards or reaching down to pick up something from the floor. 3    I do not have trouble doing out-door work such as Loss adjuster, chartered, raking leaves, or gardening. 2      Mobility and Daily Activities   I feel younger than my age. 4    I feel independent. 4    I feel energetic. 3    I live an active life.  1    I feel strong. 1    I feel healthy. 2    I feel active as other people my age. 2      How fit and strong are you.   Fit and Strong Total Score 38            Past Medical History:  Diagnosis Date   ADHD (attention deficit hyperactivity  disorder)    Arthritis    Carpal tunnel syndrome    Right   Cataracts, bilateral    very mild   Coronary artery disease    Depression    Early cataracts, bilateral    Fatty liver    Gallstones    GERD (gastroesophageal reflux disease)    Headache 2011   HX OF VISUAL MIGRAINES - NO PAIN --MIGRAINES ATTRIBUTED TO PT CUTTING NICOTINE PATCH IN HALF BEFORE APPLYING TO HIS SKIN.     Hepatitis 2001   C -treated and no problems since  2001 'cured'   History of hiatal hernia    patient denies this dx as of 04/13/23   History of kidney stones    found on CT Scan   HOH (hard of hearing)    wears hearing aids   lung ca dx'd 12/06/19   RIGHT Lower  LUNG   Mild cognitive impairment  12/19/2021   Muscle cramps    hands and legs   Osteopenia    Pain    KNEE OA   Pre-diabetes    diet controlled   Right lower lobe lung mass    Shortness of breath dyspnea    "SOMETIMES FEELS LIKE I CAN'T CATCH A DEEP BREATH "- ON GOING FOR YEARS - STATES MEDICAL DOCTOR SENT HIM FOR CARDIAC WORK UP 2011 - PT STATES HEART OK   Stroke (HCC)    HX POSSIBLE TIA - RT LEG NUMBNESS AND TINGLING FINGERS AND NUMBNESS RT FACE - ALL RESOLVED ; STATES NEUROLOGIST COULD NOT FIND ANY EVIDENCE OF STROKE - THOUGHT PT'S EPISODE MIGHT BE FROM CUTTING NICOTINE PATCH IN HALF AND APPLYING TO SKIN   Trigger finger    LEFT MIDDLE FINGER   Wears glasses    Past Surgical History:  Procedure Laterality Date   ANTERIOR CERVICAL DECOMP/DISCECTOMY FUSION N/A 09/26/2022   Procedure: Cervical four-five Anterior Cervical Decompression Fusion;  Surgeon: Barnett Abu, MD;  [facility] OR;  Service: Neurosurgery;  Laterality: N/A;   BRONCHIAL BIOPSY  02/24/2020   Procedure: BRONCHIAL BIOPSIES;  Surgeon: Leslye Peer, MD;  Location: [facility] ENDOSCOPY;  Service: Pulmonary;;   BRONCHIAL BRUSHINGS  02/24/2020   Procedure: BRONCHIAL BRUSHINGS;  Surgeon: Leslye Peer, MD;  Location: [facility] ENDOSCOPY;  Service: Pulmonary;;   BRONCHIAL NEEDLE  ASPIRATION BIOPSY  02/24/2020   Procedure: BRONCHIAL NEEDLE ASPIRATION BIOPSIES;  Surgeon: Leslye Peer, MD;  Location: [facility] ENDOSCOPY;  Service: Pulmonary;;   BRONCHIAL WASHINGS  02/24/2020   Procedure: BRONCHIAL WASHINGS;  Surgeon: Leslye Peer, MD;  Location: [facility] ENDOSCOPY;  Service: Pulmonary;;   COLONOSCOPY     CORONARY PRESSURE/FFR STUDY N/A 08/09/2022   Procedure: INTRAVASCULAR PRESSURE WIRE/FFR STUDY;  Surgeon: Orbie Pyo, MD;  [facility] INVAS[facility] CV LAB;  Service: Cardiovascular;  Laterality: N/A;   INTERCOSTAL NERVE BLOCK Right 04/28/2020   Procedure: INTERCOSTAL NERVE BLOCK;  Surgeon: Corliss Skains, MD;  [facility] OR;  Service: Thoracic;  Laterality: Right;   left ankle surgery     fractured   LEFT HEART CATH AND CORONARY ANGIOGRAPHY N/A 06/29/2021   Procedure: LEFT HEART CATH AND CORONARY ANGIOGRAPHY;  Surgeon: Orbie Pyo, MD;  [facility] LAB;  Service: Cardiovascular;  Laterality: N/A;   LEFT HEART CATH AND CORONARY ANGIOGRAPHY N/A 08/09/2022   Procedure: LEFT HEART CATH AND CORONARY ANGIOGRAPHY;  Surgeon: Orbie Pyo, MD;  [facility] LAB;  Service: Cardiovascular;  Laterality: N/A;   left knee surgery     MENISCAL REMOVED   left shoulder     torn ligaments left shoulder   LUMBAR FUSION     L3 L4 L5   NODE DISSECTION Right 04/28/2020   Procedure: NODE DISSECTION;  Surgeon: Corliss Skains, MD;  [facility]: [facility];  Service: Thoracic;  Laterality: Right;   RIGHT CARPAL TUNNEL RELEASE  2011   RIGHT KNEE ARTHROSCOPY   AUG OR SEPT 2015   AT [facility]   ROOT CANAL     ROTATOR CUFF REPAIR Left    2007   THORACOSCOPY Right 04/28/2020   XI ROBOTIC ASSISTED THORASCOPY-LOBECTOMY (Right)   TONSILLECTOMY     as child   TOTAL KNEE ARTHROPLASTY Left 07/14/2013   Procedure: LEFT TOTAL KNEE ARTHROPLASTY;  Surgeon: Shelda Pal, MD;  [facility]: [facility] ORS;  Service: Orthopedics;  Laterality: Left;   TOTAL KNEE  ARTHROPLASTY Right 07/21/2014   Procedure: RIGHT  TOTAL KNEE ARTHROPLASTY;  Surgeon: Shelda Pal, MD;  Location: [facility]S;  Service: Orthopedics;  Laterality: Right;   TOTAL SHOULDER ARTHROPLASTY Right 10/17/2019   Procedure: RIGHT REVERSE TOTAL SHOULDER REPLACEMENT;  Surgeon: Beverely Low, MD;  Location: [facility] ORS;  Service: Orthopedics;  Laterality: Right;  interscalene block   TRIGGER FINGER RELEASE Left    VIDEO BRONCHOSCOPY WITH ENDOBRONCHIAL NAVIGATION Right 02/24/2020   Procedure: VIDEO BRONCHOSCOPY WITH ENDOBRONCHIAL NAVIGATION;  Surgeon: Leslye Peer, MD;  Location: [facility] ENDOSCOPY;  Service: Pulmonary;  Laterality: Right;   WEDGE RESECTION Right 04/28/2020   XI ROBOTIC ASSISTED THORASCOPY-RIGHT LOWER LOBE WEDGE RESECTION (Right Chest)    Social History   Tobacco Use  Smoking Status Former   Current packs/day: 0.00   Types: Cigarettes   Quit date: 04/30/2012   Years since quitting: 11.0  Smokeless Tobacco Never  To begin PREP Class at [employer] on 10/1, every T/Th 10-11:15  Antania Hoefling B Amarianna Abplanalp 05/29/2023, 10:30 AM

## 2023-06-12 NOTE — Progress Notes (Signed)
[facility] PREP Weekly Session  Patient Details  Name: Loretta Kluender MRN: [id number] Date of Birth: [birth year] Age: 72 y.o. PCP: Blair Heys, MD  There were no vitals filed for this visit.   [facility] Weekly seesion - 06/12/23 1100       [facility] "PREP" Location   [facility] "PREP" Location [name]      Weekly Session   Topic Discussed Goal setting and welcome to the program   Introductions, review of PREP notebook, tour of facility; option to workout on cardio machine   Classes attended to date 1             Kamri Gotsch B Emarion Toral 06/12/2023, 11:36 AM

## 2023-06-19 ENCOUNTER — Encounter: Payer: Self-pay | Admitting: Internal Medicine

## 2023-06-19 NOTE — Progress Notes (Signed)
[facility] PREP Weekly Session  Patient Details  Name: [name] [name] MRN: [id number] Date of Birth: 1951-07-09 Age: 72 y.o. PCP: Blair Heys, MD  Vitals:   06/19/23 1131  Weight: 198 lb 9.6 oz (90.1 kg)     [facility] Weekly seesion - 06/19/23 1100       [facility] "PREP" Location   [facility] "PREP" Location [name]      Weekly Session   Topic Discussed Importance of resistance training;Other ways to be active   Goal: work up to 150 min/wk of cardio and strength training 2-3 times/wk for 20-40 minutes   Classes attended to date 3             Quran Vasco B Mckenzie Bove 06/19/2023, 11:32 AM

## 2023-06-22 ENCOUNTER — Ambulatory Visit (HOSPITAL_COMMUNITY)
Admission: RE | Admit: 2023-06-22 | Discharge: 2023-06-22 | Disposition: A | Discharge status: Home / Self Care | Payer: Medicare Other | Source: Ambulatory Visit | Attending: Internal Medicine | Admitting: Internal Medicine

## 2023-06-22 ENCOUNTER — Other Ambulatory Visit: Payer: Self-pay

## 2023-06-22 DIAGNOSIS — C3490 Malignant neoplasm of unspecified part of unspecified bronchus or lung: Secondary | ICD-10-CM | POA: Insufficient documentation

## 2023-06-22 MED ORDER — IOHEXOL 300 MG/ML  SOLN
75.0000 mL | Freq: Once | INTRAMUSCULAR | Status: AC | PRN
  Administered 2023-06-22: 75 mL via INTRAVENOUS

## 2023-06-22 MED ORDER — SODIUM CHLORIDE (PF) 0.9 % IJ SOLN
INTRAMUSCULAR | Status: AC
  Filled 2023-06-22: qty 50

## 2023-06-23 ENCOUNTER — Telehealth: Payer: Self-pay | Admitting: Internal Medicine

## 2023-06-26 NOTE — Progress Notes (Signed)
[facility] PREP Weekly Session  Patient Details  Name: [name] [name] MRN: [id number] Date of Birth: 03-14-1951 Age: 72 y.o. PCP: Debroah Loop, DO  Vitals:   06/26/23 1129  Weight: 199 lb 9.6 oz (90.5 kg)     [facility] Weekly seesion - 06/26/23 1100       [facility] "PREP" Location   [facility] "PREP" Location [name]      Weekly Session   Topic Discussed Healthy eating tips   Foods to reduce, foods to increase; introduced YUKA app; eat the rainbow of colors   Classes attended to date 5             Joyice Magda B Jacy Howat 06/26/2023, 11:30 AM

## 2023-07-03 NOTE — Progress Notes (Signed)
[facility] PREP Weekly Session  Patient Details  Name: [name] [name] MRN: [id number] Date of Birth: 16-Jan-1951 Age: 72 y.o. PCP: Debroah Loop, DO  Vitals:   07/03/23 1135  Weight: 198 lb 6.4 oz (90 kg)     [facility] Weekly seesion - 07/03/23 1100       [facility] "PREP" Location   [facility] "PREP" Location [name]      Weekly Session   Topic Discussed Health habits   Sugar demo; limit added sugars to 24 gm/day for women and 36gm/day for men; asked to come on Thursday for strength training on the machines in wellness center.   Minutes exercised this week 150 minutes    Classes attended to date 7             Annalaura Sauseda B Tanishi Nault 07/03/2023, 11:35 AM

## 2023-07-10 ENCOUNTER — Inpatient Hospital Stay: Payer: Medicare Other | Attending: Internal Medicine | Admitting: Internal Medicine

## 2023-07-10 DIAGNOSIS — C3490 Malignant neoplasm of unspecified part of unspecified bronchus or lung: Secondary | ICD-10-CM | POA: Diagnosis not present

## 2023-07-10 NOTE — Progress Notes (Signed)
[facility] PREP Weekly Session  Patient Details  Name: [name] [name] MRN: [id number] Date of Birth: Jun 09, 1951 Age: 72 y.o. PCP: Debroah Loop, DO  Vitals:   07/10/23 1123  Weight: 198 lb 12.8 oz (90.2 kg)     [facility] Weekly seesion - 07/10/23 1100       [facility] "PREP" Location   [facility] "PREP" Location [name]      Weekly Session   Topic Discussed Restaurant Eating   Salt demo: limit Na intake to 1500-2300 mg/day   Minutes exercised this week 290 minutes    Classes attended to date 8             Devorah Givhan B Bane Hagy 07/10/2023, 11:24 AM

## 2023-07-10 NOTE — Progress Notes (Signed)
[facility] [phone]   [phone]  PROGRESS NOTE FOR TELEMEDICINE VISITS  Debroah Loop, DO [address] [place] [address]  I connected withNAME@ on 07/10/23 at  9:00 AM EDT by telephone visit and verified that I am speaking with the correct person using two identifiers.   I discussed the limitations, risks, security and privacy concerns of performing an evaluation and management service by telemedicine and the availability of in-person appointments. I also discussed with the patient that there may be a patient responsible charge related to this service. The patient expressed understanding and agreed to proceed.  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   PRIOR THERAPY:   1) Status post right lower lobectomy with lymph node dissection under the care of Dr. Cliffton Asters on 04/28/2020. 2)  Adjuvant systemic chemotherapy with cisplatin 75 mg/m2 and Alimta 500 mg/mm IV every 3 weeks.  First dose expected on 06/16/2020.  Status post 4 cycles.   CURRENT THERAPY:  Observation. INTERVAL HISTORY: [name] [name] 72 y.o. male has a telephone virtual visit with me today for evaluation and discussion of his scan results.Discussed the use of AI scribe software for clinical note transcription with the patient, who gave verbal consent to proceed.  History of Present Illness   The patient, a 72 year old with a history of stage three lung cancer, underwent a right lower lobectomy and four cycles of adjuvant chemotherapy with cisplatin and Alimta in August 2021. She has been under observation for the past three years. The patient has been participating in a 12-week exercise  program referred by her cardiologist, which includes diet and nutrition education and guided workouts.  However, the patient has been experiencing a consistent sharp pain on the right side of her chest, at about elbow height. This pain could potentially be related to gallbladder stones, as identified on the scan. The patient plans to consult with her primary care provider regarding this issue.      MEDICAL HISTORY: Past Medical History:  Diagnosis Date   ADHD (attention deficit hyperactivity disorder)    Arthritis    Carpal tunnel syndrome    Right   Cataracts, bilateral    very mild   Coronary artery disease    Depression    Early cataracts, bilateral    Fatty liver    Gallstones    GERD (gastroesophageal reflux disease)    Headache 2011   HX OF VISUAL MIGRAINES - NO PAIN --MIGRAINES ATTRIBUTED TO PT CUTTING NICOTINE PATCH IN HALF BEFORE APPLYING TO HIS SKIN.     Hepatitis 2001   C -treated and no problems since  2001 'cured'   History of hiatal hernia    patient denies this dx as of 04/13/23   History of kidney stones    found on CT Scan   HOH (hard of hearing)    wears hearing aids   lung ca dx'd 12/06/19   RIGHT Lower  LUNG   Mild cognitive impairment 12/19/2021   Muscle cramps    hands and legs   Osteopenia    Pain    KNEE OA   Pre-diabetes    diet controlled   Right lower lobe lung mass    Shortness  of breath dyspnea    "SOMETIMES FEELS LIKE I CAN'T CATCH A DEEP BREATH "- ON GOING FOR YEARS - STATES MEDICAL DOCTOR SENT HIM FOR CARDIAC WORK UP 2011 - PT STATES HEART OK   Stroke (HCC)    HX POSSIBLE TIA - RT LEG NUMBNESS AND TINGLING FINGERS AND NUMBNESS RT FACE - ALL RESOLVED ; STATES NEUROLOGIST COULD NOT FIND ANY EVIDENCE OF STROKE - THOUGHT PT'S EPISODE MIGHT BE FROM CUTTING NICOTINE PATCH IN HALF AND APPLYING TO SKIN   Trigger finger    LEFT MIDDLE FINGER   Wears glasses     ALLERGIES:  is allergic to statins, chantix [varenicline], diclofenac sodium, tricor  [fenofibrate], zetia [ezetimibe], and rosuvastatin calcium.  MEDICATIONS:  Current Outpatient Medications  Medication Sig Dispense Refill   acetaminophen (TYLENOL) 500 MG tablet Take 1,000 mg by mouth every 6 (six) hours as needed for moderate pain or mild pain.     atomoxetine (STRATTERA) 80 MG capsule Take 80 mg by mouth daily.     clindamycin (CLEOCIN) 150 MG capsule Take 600 mg by mouth See admin instructions. Take 4 capsules (600 mg) by mouth prior to dental appointments     Evolocumab (REPATHA SURECLICK) 140 MG/ML SOAJ Inject 140 mg into the skin every 14 (fourteen) days. 6 mL 3   ibuprofen (ADVIL) 200 MG tablet Take 600 mg by mouth every 6 (six) hours as needed (pain.).     loratadine (CLARITIN) 10 MG tablet Take 10 mg by mouth daily.     oxyCODONE-acetaminophen (PERCOCET) 5-325 MG tablet Take 1 tablet by mouth every 4 (four) hours as needed for severe pain. 20 tablet 0   sertraline (ZOLOFT) 100 MG tablet Take 100 mg by mouth in the morning.     tamsulosin (FLOMAX) 0.4 MG CAPS capsule Take 0.4 mg by mouth in the morning.     No current facility-administered medications for this visit.    SURGICAL HISTORY:  Past Surgical History:  Procedure Laterality Date   ANTERIOR CERVICAL DECOMP/DISCECTOMY FUSION N/A 09/26/2022   Procedure: Cervical four-five Anterior Cervical Decompression Fusion;  Surgeon: Barnett Abu, MD;  Location: [facility] OR;  Service: Neurosurgery;  Laterality: N/A;   BRONCHIAL BIOPSY  02/24/2020   Procedure: BRONCHIAL BIOPSIES;  Surgeon: Leslye Peer, MD;  Location: [facility] ENDOSCOPY;  Service: Pulmonary;;   BRONCHIAL BRUSHINGS  02/24/2020   Procedure: BRONCHIAL BRUSHINGS;  Surgeon: Leslye Peer, MD;  Location: [facility] ENDOSCOPY;  Service: Pulmonary;;   BRONCHIAL NEEDLE ASPIRATION BIOPSY  02/24/2020   Procedure: BRONCHIAL NEEDLE ASPIRATION BIOPSIES;  Surgeon: Leslye Peer, MD;  Location: [facility] ENDOSCOPY;  Service: Pulmonary;;   BRONCHIAL WASHINGS  02/24/2020   Procedure:  BRONCHIAL WASHINGS;  Surgeon: Leslye Peer, MD;  Location: [facility] ENDOSCOPY;  Service: Pulmonary;;   COLONOSCOPY     CORONARY PRESSURE/FFR STUDY N/A 08/09/2022   Procedure: INTRAVASCULAR PRESSURE WIRE/FFR STUDY;  Surgeon: Orbie Pyo, MD;  Location: [facility] INVASIVE CV LAB;  Service: Cardiovascular;  Laterality: N/A;   INTERCOSTAL NERVE BLOCK Right 04/28/2020   Procedure: INTERCOSTAL NERVE BLOCK;  Surgeon: Corliss Skains, MD;  Location: [facility] OR;  Service: Thoracic;  Laterality: Right;   left ankle surgery     fractured   LEFT HEART CATH AND CORONARY ANGIOGRAPHY N/A 06/29/2021   Procedure: LEFT HEART CATH AND CORONARY ANGIOGRAPHY;  Surgeon: Orbie Pyo, MD;  Location: [facility] INVASIVE CV LAB;  Service: Cardiovascular;  Laterality: N/A;   LEFT HEART CATH AND CORONARY ANGIOGRAPHY N/A 08/09/2022   Procedure: LEFT  HEART CATH AND CORONARY ANGIOGRAPHY;  Surgeon: Orbie Pyo, MD;  Location: [facility] INVASIVE CV LAB;  Service: Cardiovascular;  Laterality: N/A;   left knee surgery     MENISCAL REMOVED   left shoulder     torn ligaments left shoulder   LUMBAR FUSION     L3 L4 L5   NODE DISSECTION Right 04/28/2020   Procedure: NODE DISSECTION;  Surgeon: Corliss Skains, MD;  Location: [facility] OR;  Service: Thoracic;  Laterality: Right;   RIGHT CARPAL TUNNEL RELEASE  2011   RIGHT KNEE ARTHROSCOPY   AUG OR SEPT 2015   AT [facility]   ROOT CANAL     ROTATOR CUFF REPAIR Left    2007   THORACOSCOPY Right 04/28/2020   XI ROBOTIC ASSISTED THORASCOPY-LOBECTOMY (Right)   TONSILLECTOMY     as child   TOTAL KNEE ARTHROPLASTY Left 07/14/2013   Procedure: LEFT TOTAL KNEE ARTHROPLASTY;  Surgeon: Shelda Pal, MD;  Location: [facility] ORS;  Service: Orthopedics;  Laterality: Left;   TOTAL KNEE ARTHROPLASTY Right 07/21/2014   Procedure: RIGHT TOTAL KNEE ARTHROPLASTY;  Surgeon: Shelda Pal, MD;  Location: [facility] ORS;  Service: Orthopedics;  Laterality: Right;   TOTAL SHOULDER ARTHROPLASTY Right  10/17/2019   Procedure: RIGHT REVERSE TOTAL SHOULDER REPLACEMENT;  Surgeon: Beverely Low, MD;  Location: [facility] ORS;  Service: Orthopedics;  Laterality: Right;  interscalene block   TRIGGER FINGER RELEASE Left    VIDEO BRONCHOSCOPY WITH ENDOBRONCHIAL NAVIGATION Right 02/24/2020   Procedure: VIDEO BRONCHOSCOPY WITH ENDOBRONCHIAL NAVIGATION;  Surgeon: Leslye Peer, MD;  Location: [facility] ENDOSCOPY;  Service: Pulmonary;  Laterality: Right;   WEDGE RESECTION Right 04/28/2020   XI ROBOTIC ASSISTED THORASCOPY-RIGHT LOWER LOBE WEDGE RESECTION (Right Chest)     REVIEW OF SYSTEMS:  A comprehensive review of systems was negative.     LABORATORY DATA: Lab Results  Component Value Date   WBC 2.8 (L) 04/20/2023   HGB 14.3 04/20/2023   HCT 41.2 04/20/2023   MCV 88.2 04/20/2023   PLT 119 (L) 04/20/2023      Chemistry      Component Value Date/Time   NA 137 04/20/2023 1001   NA 139 11/10/2022 1259   K 3.6 04/20/2023 1001   CL 102 04/20/2023 1001   CO2 27 04/20/2023 1001   BUN 21 04/20/2023 1001   BUN 22 11/10/2022 1259   CREATININE 1.22 04/20/2023 1001      Component Value Date/Time   CALCIUM 9.2 04/20/2023 1001   ALKPHOS 88 04/20/2023 1001   AST 29 04/20/2023 1001   ALT 22 04/20/2023 1001   BILITOT 0.8 04/20/2023 1001       RADIOGRAPHIC STUDIES: CT Chest W Contrast  Result Date: 07/03/2023 CLINICAL DATA:  Non-small cell lung cancer.  Restaging. * Tracking Code: BO * EXAM: CT CHEST WITH CONTRAST TECHNIQUE: Multidetector CT imaging of the chest was performed during intravenous contrast administration. RADIATION DOSE REDUCTION: This exam was performed according to the departmental dose-optimization program which includes automated exposure control, adjustment of the mA and/or kV according to patient size and/or use of iterative reconstruction technique. CONTRAST:  75mL OMNIPAQUE IOHEXOL 300 MG/ML  SOLN COMPARISON:  PET-CT dated 04/04/2023 FINDINGS: Cardiovascular: Heart size is normal.  Aortic atherosclerosis and coronary artery calcifications. No pericardial effusion. Mediastinum/Nodes: No enlarged mediastinal, hilar, or axillary lymph nodes. Thyroid gland, trachea, and esophagus demonstrate no significant findings. Lungs/Pleura: No pleural effusion. Status post right lower lobectomy. Emphysema. No airspace consolidation or pneumothorax. Stable 6 mm  right lower lobe nodule, image 127/5. This is remained unchanged since January of 2022. Upper Abdomen: No acute abnormality. Again seen are scattered enhancing nodules within the spleen which appears similar in size and multiplicity compared with the previous exam. Small stones noted layering within the dependent portion of the gallbladder. Cyst within the right kidney is unchanged measuring 5.4 cm. No follow-up imaging recommended. Musculoskeletal: Previous right shoulder arthroplasty. Partially visualized postoperative changes involving the lumbar spine with underlying lucent bone lesion. IMPRESSION: 1. Status post right lower lobectomy. No signs of local recurrence or metastatic disease within the chest. 2. Stable 6 mm right lower lobe nodule. 3. Stable scattered enhancing nodules within the spleen. 4. Cholelithiasis. 5. Coronary artery calcifications. 6. Partially visualized postoperative changes involving the lumbar spine with underlying lucent bone lesion. 7. Aortic Atherosclerosis (ICD10-I70.0) and Emphysema (ICD10-J43.9). Electronically Signed   By: Signa Kell M.D.   On: 07/03/2023 13:43    ASSESSMENT AND PLAN:  This is a very pleasant 72 years old white male with a stage IIIa non-small cell lung cancer, mucinous adenocarcinoma status post right lower lobectomy with lymph node dissection in August 2021. The patient underwent adjuvant systemic chemotherapy with cisplatin and Alimta status post 4 cycles. The patient tolerated the previous 4 cycles of his treatment well with no concerning adverse effect except for fatigue. The patient has  been on observation since that time and he is feeling fine with no concerning complaints except for the recent low back pain.  He was found to have a suspicious lesion at the L3 by his neurosurgeon. The patient had CT of the lumbar spine performed earlier today.  I personally and independently reviewed the imaging studies with the patient and his wife.  The scan showed bubbly lytic lesion at L3 and L5 bodies centered on the left L3 and bilateral L5 pedicle screws and this pattern are suggestive of reactive process to the pedicle screws but of course with his history of lung cancer metastatic disease or multiple myeloma could not be completely excluded. A PET scan on April 01, 2023 showed no concerning finding for disease recurrence but there was hypermetabolic activity in the L3-5 concerning for metastatic disease.  The patient underwent biopsy under the care of Dr. Danielle Dess and it was negative for malignancy.  The patient is currently on observation.  He had repeat CT scan of the chest performed recently.  I personally and independently reviewed the scan and discussed the result with the patient today.  There is no evidence for disease progression on the recent the scan.    Stage III Lung Cancer   Status post right lower lobectomy and adjuvant chemotherapy with cisplatin and Alimta in 2021. Recent PET scan showed an area of concern, but biopsy was negative. Most recent chest scan on June 22, 2023, showed stable 6mm right lower lobe nodule with no evidence of recurrence or metastatic disease.   -Continue observation.   -Plan for follow-up in six months with labs and scan.    Gallstones   Asymptomatic gallstones noted on recent chest scan. Patient reports consistent sharp pain on right side at elbow height, which could be related to gallstones.   -Recommend consultation with primary care physician and possible surgical consultation if pain persists.    General Health Maintenance   Patient is  participating in a 12-week exercise program through the [employer], referred by her cardiologist.   -Encourage continuation of exercise program.   The patient was advised to call  immediately if she has any other concerning symptoms in the interval. I discussed the assessment and treatment plan with the patient. The patient was provided an opportunity to ask questions and all were answered. The patient agreed with the plan and demonstrated an understanding of the instructions.   The patient was advised to call back or seek an in-person evaluation if the symptoms worsen or if the condition fails to improve as anticipated.  I provided 15 minutes of non face-to-face telephone visit time during this encounter, and > 50% was spent counseling as documented under my assessment & plan.  Lajuana Matte, MD 07/10/2023 8:09 AM  Disclaimer: This note was dictated with voice recognition software. Similar sounding words can inadvertently be transcribed and may not be corrected upon review.

## 2023-07-12 ENCOUNTER — Other Ambulatory Visit: Payer: Self-pay | Admitting: Family Medicine

## 2023-07-12 ENCOUNTER — Ambulatory Visit
Admission: RE | Admit: 2023-07-12 | Discharge: 2023-07-12 | Disposition: A | Discharge status: Home / Self Care | Payer: Medicare Other | Source: Ambulatory Visit | Attending: Family Medicine | Admitting: Family Medicine

## 2023-07-12 DIAGNOSIS — R0781 Pleurodynia: Secondary | ICD-10-CM

## 2023-07-17 NOTE — Progress Notes (Signed)
[name] PREP Weekly Session  Patient Details  Name: [name] [name] MRN: [id number] Date of Birth: Feb 27, 1951 Age: 72 y.o. PCP: Debroah Loop, DO  Vitals:   07/17/23 1132  Weight: 196 lb 8 oz (89.1 kg)     [name] Weekly seesion - 07/17/23 1100       [name] "PREP" Location   [name] "PREP" Location [name] [name]      Weekly Session   Topic Discussed Stress management and problem solving   finger tip mudra breathwork to reduce stress; importance of sleep, 7-9 hrs/night; baby to bed concept; importance of using CPAP w/OSA   Minutes exercised this week 275 minutes    Classes attended to date 40             [name] 07/17/2023, 11:33 AM

## 2023-07-24 NOTE — Progress Notes (Signed)
[facility] PREP Weekly Session  Patient Details  Name: [name] [name] MRN: [id number] Date of Birth: 1951/04/29 Age: 72 y.o. PCP: Debroah Loop, DO  There were no vitals filed for this visit.   [facility] Weekly seesion - 07/24/23 1100       [facility] "PREP" Location   [facility] "PREP" Location [name]      Weekly Session   Topic Discussed Expectations and non-scale victories   Halfway through program, reivew, reivist, restate goals; Staying positive--what's your WHY and WHO are you doing this for?   Classes attended to date 58             Sonia Baller 07/24/2023, 11:24 AM

## 2023-08-14 ENCOUNTER — Encounter: Payer: Self-pay | Admitting: Internal Medicine

## 2023-08-16 NOTE — Progress Notes (Signed)
[facility] PREP Weekly Session  Patient Details  Name: [name] [name] MRN: [id number] Date of Birth: 01-15-1951 Age: 72 y.o. PCP: Debroah Loop, DO  There were no vitals filed for this visit.   [facility] Weekly seesion - 08/16/23 1000       [facility] "PREP" Location   [facility] "PREP" Location [name]      Weekly Session   Topic Discussed Other   Portion control; visualize your portion size demo; review of [employer] food/nutrition label.   Classes attended to date 22             Douglas Rooks B Krisha Beegle 08/16/2023, 10:59 AM

## 2023-08-21 NOTE — Progress Notes (Signed)
[facility] PREP Weekly Session  Patient Details  Name: [name] [name] MRN: [id number] Date of Birth: Oct 21, 1950 Age: 72 y.o. PCP: Debroah Loop, DO  Vitals:   08/21/23 1121  Weight: 192 lb 3.2 oz (87.2 kg)     [facility] Weekly seesion - 08/21/23 1100       [facility] "PREP" Location   [facility] "PREP" Location [name]      Weekly Session   Topic Discussed Calorie breakdown   Carbs, proteins and fats---USDA recommendattions; simple vs. comlex carbs; [facility] membership talk w/Nick   Classes attended to date 63             Keno Caraway B Prescious Hurless 08/21/2023, 11:22 AM

## 2023-08-22 ENCOUNTER — Ambulatory Visit: Payer: Self-pay | Admitting: General Surgery

## 2023-08-23 ENCOUNTER — Telehealth: Payer: Self-pay

## 2023-08-23 ENCOUNTER — Telehealth: Payer: Self-pay | Admitting: *Deleted

## 2023-08-23 NOTE — Telephone Encounter (Signed)
   Pre-operative Risk Assessment    Patient Name: [name] [name]  DOB: 16-Apr-1951 MRN: [id number]  Last ov:04/05/23 Dr. Rennis Golden  Upcoming visit: Unknown      Request for Surgical Clearance    Procedure:   Inguinal hernia surgery   Date of Surgery:  Clearance TBD                                 Surgeon:  Melody Haver, MD  Surgeon's Group or Practice Name:  [facility]  Phone number:  [phone] Fax number:  [phone]   Type of Clearance Requested:   - Medical    Type of Anesthesia:  General    Additional requests/questions:    [name]   08/23/2023, 2:10 PM

## 2023-08-23 NOTE — Telephone Encounter (Signed)
   Name: [name] [name]  DOB: 28-Nov-1950  MRN: [id number]  Primary Cardiologist: Chrystie Nose, MD   Preoperative team, please contact this patient and set up a phone call appointment for further preoperative risk assessment. Please obtain consent and complete medication review. Thank you for your help. Last seen 04/05/2023 not follow up appointment is listed.   I confirm that guidance regarding antiplatelet and oral anticoagulation therapy has been completed and, if necessary, noted below..  Not on anticoagulants or antiplatelets.   I also confirmed the patient resides in the state of [place]. As per [facility] Medical Board telemedicine laws, the patient must reside in the state in which the provider is licensed.   Joni Reining, NP 08/23/2023, 3:18 PM [facility]

## 2023-08-23 NOTE — Telephone Encounter (Signed)
Pt has been scheduled tele pre op appt 09/06/23 as procedure is being planned for early Jan 2025 per the pt. Pt tells me he is now on ASA 81 mg daily per Dr. Rennis Golden. I have added ASA to med list. We will need to confirm with requesting office if ASA if needing to be held for procedure.

## 2023-08-23 NOTE — Telephone Encounter (Signed)
Pt has been scheduled tele pre op appt 09/06/23 as procedure is being planned for early Jan 2025 per the pt. Pt tells me he is now on ASA 81 mg daily per Dr. Rennis Golden. I have added ASA to med list. We will need to confirm with requesting office if ASA if needing to be held for procedure.   Med rec and consent are done.      Patient Consent for Virtual Visit        [name] [name] has provided verbal consent on 08/23/2023 for a virtual visit (video or telephone).   CONSENT FOR VIRTUAL VISIT FOR:  [name] [name]  By participating in this virtual visit I agree to the following:  I hereby voluntarily request, consent and authorize [facility] and its employed or contracted physicians, physician assistants, nurse practitioners or other licensed health care professionals (the Practitioner), to provide me with telemedicine health care services (the "Services") as deemed necessary by the treating Practitioner. I acknowledge and consent to receive the Services by the Practitioner via telemedicine. I understand that the telemedicine visit will involve communicating with the Practitioner through live audiovisual communication technology and the disclosure of certain medical information by electronic transmission. I acknowledge that I have been given the opportunity to request an in-person assessment or other available alternative prior to the telemedicine visit and am voluntarily participating in the telemedicine visit.  I understand that I have the right to withhold or withdraw my consent to the use of telemedicine in the course of my care at any time, without affecting my right to future care or treatment, and that the Practitioner or I may terminate the telemedicine visit at any time. I understand that I have the right to inspect all information obtained and/or recorded in the course of the telemedicine visit and may receive copies of available information for a reasonable fee.  I understand that  some of the potential risks of receiving the Services via telemedicine include:  Delay or interruption in medical evaluation due to technological equipment failure or disruption; Information transmitted may not be sufficient (e.g. poor resolution of images) to allow for appropriate medical decision making by the Practitioner; and/or  In rare instances, security protocols could fail, causing a breach of personal health information.  Furthermore, I acknowledge that it is my responsibility to provide information about my medical history, conditions and care that is complete and accurate to the best of my ability. I acknowledge that Practitioner's advice, recommendations, and/or decision may be based on factors not within their control, such as incomplete or inaccurate data provided by me or distortions of diagnostic images or specimens that may result from electronic transmissions. I understand that the practice of medicine is not an exact science and that Practitioner makes no warranties or guarantees regarding treatment outcomes. I acknowledge that a copy of this consent can be made available to me via my patient portal [facility] MyChart), or I can request a printed copy by calling the office of [facility].    I understand that my insurance will be billed for this visit.   I have read or had this consent read to me. I understand the contents of this consent, which adequately explains the benefits and risks of the Services being provided via telemedicine.  I have been provided ample opportunity to ask questions regarding this consent and the Services and have had my questions answered to my satisfaction. I give my informed consent for the services to be provided  through the use of telemedicine in my medical care

## 2023-08-25 ENCOUNTER — Encounter (HOSPITAL_COMMUNITY): Payer: Self-pay

## 2023-08-25 ENCOUNTER — Ambulatory Visit (HOSPITAL_COMMUNITY)
Admission: RE | Admit: 2023-08-25 | Discharge: 2023-08-25 | Disposition: A | Discharge status: Home / Self Care | Payer: Medicare Other | Source: Ambulatory Visit | Attending: Internal Medicine | Admitting: Internal Medicine

## 2023-08-25 VITALS — BP 104/69 | HR 86 | Temp 97.8°F | Resp 18

## 2023-08-25 DIAGNOSIS — K648 Other hemorrhoids: Secondary | ICD-10-CM

## 2023-08-25 DIAGNOSIS — K602 Anal fissure, unspecified: Secondary | ICD-10-CM | POA: Diagnosis not present

## 2023-08-25 MED ORDER — HYDROCORTISONE ACETATE 25 MG RE SUPP: 25.0000 mg | Freq: Two times a day (BID) | RECTAL | 0 refills | Status: AC

## 2023-08-25 MED ORDER — LIDOCAINE-PRILOCAINE 2.5-2.5 % EX CREA: 1.0000 | TOPICAL_CREAM | CUTANEOUS | 0 refills | Status: DC | PRN

## 2023-08-25 MED ORDER — CYCLOBENZAPRINE HCL 5 MG PO TABS: 5.0000 mg | ORAL_TABLET | Freq: Three times a day (TID) | ORAL | 0 refills | Status: DC | PRN

## 2023-08-25 NOTE — ED Provider Notes (Signed)
[facility]    CSN: [id number] Arrival date & time: 08/25/23  1504      History   Chief Complaint Chief Complaint  Patient presents with   Hemorrhoids    Have been in increasing pain from rectal spasms for several days. Seeking some kind of relief for the immediate until more aggressive treatment. - Entered by patient    HPI [name] [name] is a 72 y.o. male.   72 year old male who presents to urgent care with complaints of rectal pain.  He reports that on Monday he was very constipated and had a very large hard bowel movement.  He felt significant pain during this and has continued to have pain after.  He took MiraLAX at that time which seems to have resolved his constipation but now he is having very mushy bowel movements.  Since then he has been having spasms in his rectal area causing significant pain at times.  He also feels sharp pains when he is having bowel movements.  He is already scheduled to see a gastroenterologist on Wednesday but wanted to come in to see if there was anything he could take to help get him through until then.     Past Medical History:  Diagnosis Date   ADHD (attention deficit hyperactivity disorder)    Arthritis    Carpal tunnel syndrome    Right   Cataracts, bilateral    very mild   Coronary artery disease    Depression    Early cataracts, bilateral    Fatty liver    Gallstones    GERD (gastroesophageal reflux disease)    Headache 2011   HX OF VISUAL MIGRAINES - NO PAIN --MIGRAINES ATTRIBUTED TO PT CUTTING NICOTINE PATCH IN HALF BEFORE APPLYING TO HIS SKIN.     Hepatitis 2001   C -treated and no problems since  2001 'cured'   History of hiatal hernia    patient denies this dx as of 04/13/23   History of kidney stones    found on CT Scan   HOH (hard of hearing)    wears hearing aids   lung ca dx'd 12/06/19   RIGHT Lower  LUNG   Mild cognitive impairment 12/19/2021   Muscle cramps    hands and legs   Osteopenia    Pain     KNEE OA   Pre-diabetes    diet controlled   Right lower lobe lung mass    Shortness of breath dyspnea    "SOMETIMES FEELS LIKE I CAN'T CATCH A DEEP BREATH "- ON GOING FOR YEARS - STATES MEDICAL DOCTOR SENT HIM FOR CARDIAC WORK UP 2011 - PT STATES HEART OK   Stroke (HCC)    HX POSSIBLE TIA - RT LEG NUMBNESS AND TINGLING FINGERS AND NUMBNESS RT FACE - ALL RESOLVED ; STATES NEUROLOGIST COULD NOT FIND ANY EVIDENCE OF STROKE - THOUGHT PT'S EPISODE MIGHT BE FROM CUTTING NICOTINE PATCH IN HALF AND APPLYING TO SKIN   Trigger finger    LEFT MIDDLE FINGER   Wears glasses     Patient Active Problem List   Diagnosis Date Noted   Cervical myelopathy (HCC) 09/26/2022   Abnormal findings on diagnostic imaging of heart and coronary circulation    Adenocarcinoma of right lung, stage 3 (HCC) 05/13/2020   Encounter for antineoplastic chemotherapy 05/13/2020   Goals of care, counseling/discussion 05/13/2020   S/P robot-assisted surgical procedure 04/28/2020   Right lower lobe lung mass 02/18/2020   S/P shoulder replacement, right 10/17/2019  Pseudoarthrosis of lumbar spine 09/07/2017   Spondylolisthesis of lumbar region 03/19/2017   Contusion of right hand 01/31/2016   S/P right TKA 07/21/2014   Overweight (BMI 25.0-29.9) 07/15/2013    Past Surgical History:  Procedure Laterality Date   ANTERIOR CERVICAL DECOMP/DISCECTOMY FUSION N/A 09/26/2022   Procedure: Cervical four-five Anterior Cervical Decompression Fusion;  Surgeon: Barnett Abu, MD;  Location: [facility];  Service: Neurosurgery;  Laterality: N/A;   BRONCHIAL BIOPSY  02/24/2020   Procedure: BRONCHIAL BIOPSIES;  Surgeon: Leslye Peer, MD;  Location: [facility] ENDOSCOPY;  Service: Pulmonary;;   BRONCHIAL BRUSHINGS  02/24/2020   Procedure: BRONCHIAL BRUSHINGS;  Surgeon: Leslye Peer, MD;  Location: [facility] ENDOSCOPY;  Service: Pulmonary;;   BRONCHIAL NEEDLE ASPIRATION BIOPSY  02/24/2020   Procedure: BRONCHIAL NEEDLE ASPIRATION BIOPSIES;  Surgeon:  Leslye Peer, MD;  Location: [facility] ENDOSCOPY;  Service: Pulmonary;;   BRONCHIAL WASHINGS  02/24/2020   Procedure: BRONCHIAL WASHINGS;  Surgeon: Leslye Peer, MD;  Location: [facility] ENDOSCOPY;  Service: Pulmonary;;   COLONOSCOPY     CORONARY PRESSURE/FFR STUDY N/A 08/09/2022   Procedure: INTRAVASCULAR PRESSURE WIRE/FFR STUDY;  Surgeon: Orbie Pyo, MD;  [facility];  Service: Cardiovascular;  Laterality: N/A;   INTERCOSTAL NERVE BLOCK Right 04/28/2020   Procedure: INTERCOSTAL NERVE BLOCK;  Surgeon: Corliss Skains, MD;  Location: [facility];  Service: Thoracic;  Laterality: Right;   left ankle surgery     fractured   LEFT HEART CATH AND CORONARY ANGIOGRAPHY N/A 06/29/2021   Procedure: LEFT HEART CATH AND CORONARY ANGIOGRAPHY;  Surgeon: Orbie Pyo, MD;  [facility];  Service: Cardiovascular;  Laterality: N/A;   LEFT HEART CATH AND CORONARY ANGIOGRAPHY N/A 08/09/2022   Procedure: LEFT HEART CATH AND CORONARY ANGIOGRAPHY;  Surgeon: Orbie Pyo, MD;  [facility];  Service: Cardiovascular;  Laterality: N/A;   left knee surgery     MENISCAL REMOVED   left shoulder     torn ligaments left shoulder   LUMBAR FUSION     L3 L4 L5   NODE DISSECTION Right 04/28/2020   Procedure: NODE DISSECTION;  Surgeon: Corliss Skains, MD;  Location: [facility];  Service: Thoracic;  Laterality: Right;   RIGHT CARPAL TUNNEL RELEASE  2011   RIGHT KNEE ARTHROSCOPY   AUG OR SEPT 2015   AT [facility]   ROOT CANAL     ROTATOR CUFF REPAIR Left    2007   THORACOSCOPY Right 04/28/2020   XI ROBOTIC ASSISTED THORASCOPY-LOBECTOMY (Right)   TONSILLECTOMY     as child   TOTAL KNEE ARTHROPLASTY Left 07/14/2013   Procedure: LEFT TOTAL KNEE ARTHROPLASTY;  Surgeon: Shelda Pal, MD;  Location: [facility]S;  Service: Orthopedics;  Laterality: Left;   TOTAL KNEE ARTHROPLASTY Right 07/21/2014   Procedure: RIGHT TOTAL KNEE ARTHROPLASTY;  Surgeon: Shelda Pal, MD;  Location: [facility]S;  Service: Orthopedics;  Laterality: Right;   TOTAL SHOULDER ARTHROPLASTY Right 10/17/2019   Procedure: RIGHT REVERSE TOTAL SHOULDER REPLACEMENT;  Surgeon: Beverely Low, MD;  Location: [facility];  Service: Orthopedics;  Laterality: Right;  interscalene block   TRIGGER FINGER RELEASE Left    VIDEO BRONCHOSCOPY WITH ENDOBRONCHIAL NAVIGATION Right 02/24/2020   Procedure: VIDEO BRONCHOSCOPY WITH ENDOBRONCHIAL NAVIGATION;  Surgeon: Leslye Peer, MD;  Location: [facility] ENDOSCOPY;  Service: Pulmonary;  Laterality: Right;   WEDGE RESECTION Right 04/28/2020   XI ROBOTIC ASSISTED THORASCOPY-RIGHT LOWER LOBE WEDGE RESECTION (Right Chest)  Home Medications    Prior to Admission medications   Medication Sig Start Date End Date Taking? Authorizing Provider  acetaminophen (TYLENOL) 500 MG tablet Take 1,000 mg by mouth every 6 (six) hours as needed for moderate pain or mild pain.    [provider]  amphetamine-dextroamphetamine (ADDERALL XR) 15 MG 24 hr capsule Take 15 mg by mouth daily. 08/22/23   [provider]  aspirin EC 81 MG tablet Take 81 mg by mouth daily. Swallow whole.    [provider]  atomoxetine (STRATTERA) 80 MG capsule Take 80 mg by mouth daily. Patient not taking: Reported on 08/23/2023    [provider]  clindamycin (CLEOCIN) 150 MG capsule Take 600 mg by mouth See admin instructions. Take 4 capsules (600 mg) by mouth prior to dental appointments Patient not taking: Reported on 08/23/2023 12/23/19   [provider]  Evolocumab (REPATHA SURECLICK) 140 MG/ML SOAJ Inject 140 mg into the skin every 14 (fourteen) days. 11/09/22   Hilty, Lisette Abu, MD  ibuprofen (ADVIL) 200 MG tablet Take 600 mg by mouth every 6 (six) hours as needed (pain.).    [provider]  loratadine (CLARITIN) 10 MG tablet Take 10 mg by mouth daily. Patient not taking: Reported on 08/23/2023    [provider]   oxyCODONE-acetaminophen (PERCOCET) 5-325 MG tablet Take 1 tablet by mouth every 4 (four) hours as needed for severe pain. Patient not taking: Reported on 08/23/2023 04/16/23 04/15/24  Barnett Abu, MD  sertraline (ZOLOFT) 100 MG tablet Take 100 mg by mouth in the morning. 05/13/21   [provider]  tamsulosin (FLOMAX) 0.4 MG CAPS capsule Take 0.4 mg by mouth in the morning. 03/24/21   [provider]    Family History Family History  Problem Relation Age of Onset   Alcohol abuse Father    Hyperlipidemia Father    Heart attack Brother 108   Hyperlipidemia Brother    Hyperlipidemia Sister        3 sisters   Heart attack Brother    Hyperlipidemia Brother     Social History Social History   Tobacco Use   Smoking status: Former    Current packs/day: 0.00    Types: Cigarettes    Quit date: 04/30/2012    Years since quitting: 11.3   Smokeless tobacco: Never  Vaping Use   Vaping status: Former  Substance Use Topics   Alcohol use: Not Currently    Alcohol/week: 7.0 standard drinks of alcohol    Types: 7 Shots of liquor per week    Comment: Now drinks 2 beers per week   Drug use: Not Currently    Types: Marijuana    Comment: ocassionally, last time smoked 07/2017     Allergies   Statins, Chantix [varenicline], Diclofenac sodium, Tricor [fenofibrate], Zetia [ezetimibe], and Rosuvastatin calcium   Review of Systems Review of Systems  Constitutional:  Negative for chills and fever.  HENT:  Negative for ear pain and sore throat.   Eyes:  Negative for pain and visual disturbance.  Respiratory:  Negative for cough and shortness of breath.   Cardiovascular:  Negative for chest pain and palpitations.  Gastrointestinal:  Positive for rectal pain. Negative for abdominal pain and vomiting.  Genitourinary:  Negative for dysuria and hematuria.  Musculoskeletal:  Negative for arthralgias and back pain.  Skin:  Negative for color change and rash.  Neurological:  Negative  for seizures and syncope.  All other systems reviewed and are negative.  Physical Exam Triage Vital Signs ED Triage Vitals  Encounter Vitals Group     BP 08/25/23 1553 104/69     Systolic BP Percentile --      Diastolic BP Percentile --      Pulse Rate 08/25/23 1553 86     Resp 08/25/23 1553 18     Temp 08/25/23 1553 97.8 F (36.6 C)     Temp Source 08/25/23 1553 Oral     SpO2 08/25/23 1553 96 %     Weight --      Height --      Head Circumference --      Peak Flow --      Pain Score 08/25/23 1551 5     Pain Loc --      Pain Education --      Exclude from Growth Chart --    No data found.  Updated Vital Signs BP 104/69 (BP Location: Left Arm)   Pulse 86   Temp 97.8 F (36.6 C) (Oral)   Resp 18   SpO2 96%   Visual Acuity Right Eye Distance:   Left Eye Distance:   Bilateral Distance:    Right Eye Near:   Left Eye Near:    Bilateral Near:     Physical Exam Vitals and nursing note reviewed.  Constitutional:      General: He is not in acute distress.    Appearance: He is well-developed.  HENT:     Head: Normocephalic and atraumatic.  Eyes:     Conjunctiva/sclera: Conjunctivae normal.  Cardiovascular:     Rate and Rhythm: Normal rate and regular rhythm.     Heart sounds: No murmur heard. Pulmonary:     Effort: Pulmonary effort is normal. No respiratory distress.     Breath sounds: Normal breath sounds.  Abdominal:     Palpations: Abdomen is soft.     Tenderness: There is no abdominal tenderness.  Genitourinary:   Musculoskeletal:        General: No swelling.     Cervical back: Neck supple.  Skin:    General: Skin is warm and dry.     Capillary Refill: Capillary refill takes less than 2 seconds.  Neurological:     Mental Status: He is alert.  Psychiatric:        Mood and Affect: Mood normal.      UC Treatments / Results  Labs (all labs ordered are listed, but only abnormal results are displayed) Labs Reviewed - No data to  display  EKG   Radiology No results found.  Procedures Procedures (including critical care time)  Medications Ordered in UC Medications - No data to display  Initial Impression / Assessment and Plan / UC Course  I have reviewed the triage vital signs and the nursing notes.  Pertinent labs & imaging results that were available during my care of the patient were reviewed by me and considered in my medical decision making (see chart for details).     Inflamed internal hemorrhoid  Rectal fissure   Likely inflamed internal hemorrhoid along with small rectal fissure. Will treat with the following:  Hydrocortisone 25mg  twice daily until follow up with gastroenterologist  Lidocaine cream 3-4 times daily to the rectum as needed for pain Cyclobenzaprine 5mg  every 8 hours as needed for spasms but use caution as this can cause sleepiness. Do not drive or drink while taking this medication. Keep follow up appointment with Gastroenterology Keep bowels as soft as possible. May  use miralax or colace to help with this. Avoid stimulant laxatives.  Return to urgent care or PCP if symptoms worsen or fail to resolve.    Final Clinical Impressions(s) / UC Diagnoses   Final diagnoses:  None   Discharge Instructions   None    ED Prescriptions   None    PDMP not reviewed this encounter.   Landis Martins, [place] 08/25/23 1656

## 2023-08-25 NOTE — ED Triage Notes (Signed)
Pt presents with hemorrhoids x 5 days. Pt rates his pain 5/10 at this time, states the pain "comes and goes 24 hours a day." Pt reports his BM are abnormal for him, denies n/v/d. Pt has taken Imodium, OTC suppositories, and OTC hemorrhoid cream with no relief.

## 2023-08-25 NOTE — Discharge Instructions (Addendum)
Likely inflamed internal hemorrhoid along with small rectal fissure. Will treat with the following:  Hydrocortisone 25mg  twice daily until follow up with gastroenterologist  Lidocaine cream 3-4 times daily to the rectum as needed for pain Cyclobenzaprine 5mg  every 8 hours as needed for spasms but use caution as this can cause sleepiness. Do not drive or drink while taking this medication. Keep follow up appointment with Gastroenterology Keep bowels as soft as possible. May use miralax or colace to help with this. Avoid stimulant laxatives.  Return to urgent care or PCP if symptoms worsen or fail to resolve.

## 2023-08-27 NOTE — Telephone Encounter (Signed)
Confirmed ASA will need to be held.

## 2023-08-28 NOTE — Progress Notes (Signed)
[facility] PREP Weekly Session  Patient Details  Name: Derrik Hauskins MRN: [id number] Date of Birth: 17-Sep-1950 Age: 72 y.o. PCP: Debroah Loop, DO  Vitals:   08/28/23 1150  Weight: 192 lb 12.8 oz (87.5 kg)     [facility] Weekly seesion - 08/28/23 1100       [facility] "PREP" Location   [facility] "PREP" Location [name]      Weekly Session   Topic Discussed Other   How fit and strong survey completed; fit testing completed; final assessment visits scheduled for Thursday, review of documents to bring to that visit.   Classes attended to date 51             Ryon Layton B Kasarah Sitts 08/28/2023, 11:51 AM

## 2023-08-30 NOTE — Progress Notes (Signed)
[facility] PREP Evaluation  Patient Details  Name: [name] [name] MRN: [id number] Date of Birth: [birth year] Age: 72 y.o. PCP: Debroah Loop, DO  Vitals:   08/30/23 1039  BP: 110/70  Pulse: 77  SpO2: 99%  Weight: 192 lb 12.8 oz (87.5 kg)     [facility] Eval - 08/30/23 1000       [facility] "PREP" Location   [facility] "PREP" Location [name]      Referral    Program Start Date 06/12/23    Program End Date 08/30/23      Measurement   Waist Circumference 40 inches    Waist Circumference End Program 40 inches    Hip Circumference 40 inches    Hip Circumference End Program 39 inches    Body fat 25.4 percent      Mobility and Daily Activities   I find it easy to walk up or down two or more flights of stairs. 3    I have no trouble taking out the trash. 4    I do housework such as vacuuming and dusting on my own without difficulty. 4    I can easily lift a gallon of milk (8lbs). 4    I can easily walk a mile. 2    I have no trouble reaching into high cupboards or reaching down to pick up something from the floor. 4    I do not have trouble doing out-door work such as Loss adjuster, chartered, raking leaves, or gardening. 4      Mobility and Daily Activities   I feel younger than my age. 4    I feel independent. 4    I feel energetic. 4    I live an active life.  3    I feel strong. 3    I feel healthy. 4    I feel active as other people my age. 4      How fit and strong are you.   Fit and Strong Total Score 51            Past Medical History:  Diagnosis Date   ADHD (attention deficit hyperactivity disorder)    Arthritis    Carpal tunnel syndrome    Right   Cataracts, bilateral    very mild   Coronary artery disease    Depression    Early cataracts, bilateral    Fatty liver    Gallstones    GERD (gastroesophageal reflux disease)    Headache 2011   HX OF VISUAL MIGRAINES - NO PAIN --MIGRAINES ATTRIBUTED TO PT CUTTING NICOTINE PATCH IN HALF BEFORE APPLYING TO HIS SKIN.      Hepatitis 2001   C -treated and no problems since  2001 'cured'   History of hiatal hernia    patient denies this dx as of 04/13/23   History of kidney stones    found on CT Scan   HOH (hard of hearing)    wears hearing aids   lung ca dx'd 12/06/19   RIGHT Lower  LUNG   Mild cognitive impairment 12/19/2021   Muscle cramps    hands and legs   Osteopenia    Pain    KNEE OA   Pre-diabetes    diet controlled   Right lower lobe lung mass    Shortness of breath dyspnea    "SOMETIMES FEELS LIKE I CAN'T CATCH A DEEP BREATH "- ON GOING FOR YEARS - STATES MEDICAL DOCTOR SENT HIM FOR CARDIAC WORK UP 2011 -  PT STATES HEART OK   Stroke (HCC)    HX POSSIBLE TIA - RT LEG NUMBNESS AND TINGLING FINGERS AND NUMBNESS RT FACE - ALL RESOLVED ; STATES NEUROLOGIST COULD NOT FIND ANY EVIDENCE OF STROKE - THOUGHT PT'S EPISODE MIGHT BE FROM CUTTING NICOTINE PATCH IN HALF AND APPLYING TO SKIN   Trigger finger    LEFT MIDDLE FINGER   Wears glasses    Past Surgical History:  Procedure Laterality Date   ANTERIOR CERVICAL DECOMP/DISCECTOMY FUSION N/A 09/26/2022   Procedure: Cervical four-five Anterior Cervical Decompression Fusion;  Surgeon: Barnett Abu, MD;  [facility];  Service: Neurosurgery;  Laterality: N/A;   BRONCHIAL BIOPSY  02/24/2020   Procedure: BRONCHIAL BIOPSIES;  Surgeon: Leslye Peer, MD;  Location: [facility] ENDOSCOPY;  Service: Pulmonary;;   BRONCHIAL BRUSHINGS  02/24/2020   Procedure: BRONCHIAL BRUSHINGS;  Surgeon: Leslye Peer, MD;  Location: [facility] ENDOSCOPY;  Service: Pulmonary;;   BRONCHIAL NEEDLE ASPIRATION BIOPSY  02/24/2020   Procedure: BRONCHIAL NEEDLE ASPIRATION BIOPSIES;  Surgeon: Leslye Peer, MD;  Location: [facility] ENDOSCOPY;  Service: Pulmonary;;   BRONCHIAL WASHINGS  02/24/2020   Procedure: BRONCHIAL WASHINGS;  Surgeon: Leslye Peer, MD;  Location: [facility] ENDOSCOPY;  Service: Pulmonary;;   COLONOSCOPY     CORONARY PRESSURE/FFR STUDY N/A 08/09/2022   Procedure: INTRAVASCULAR  PRESSURE WIRE/FFR STUDY;  Surgeon: Orbie Pyo, MD;  [facility] INVASIVE CV LAB;  Service: Cardiovascular;  Laterality: N/A;   INTERCOSTAL NERVE BLOCK Right 04/28/2020   Procedure: INTERCOSTAL NERVE BLOCK;  Surgeon: Corliss Skains, MD;  [facility]: [facility] OR;  Service: Thoracic;  Laterality: Right;   left ankle surgery     fractured   LEFT HEART CATH AND CORONARY ANGIOGRAPHY N/A 06/29/2021   Procedure: LEFT HEART CATH AND CORONARY ANGIOGRAPHY;  Surgeon: Orbie Pyo, MD;  [facility] LAB;  Service: Cardiovascular;  Laterality: N/A;   LEFT HEART CATH AND CORONARY ANGIOGRAPHY N/A 08/09/2022   Procedure: LEFT HEART CATH AND CORONARY ANGIOGRAPHY;  Surgeon: Orbie Pyo, MD;  [facility];  Service: Cardiovascular;  Laterality: N/A;   left knee surgery     MENISCAL REMOVED   left shoulder     torn ligaments left shoulder   LUMBAR FUSION     L3 L4 L5   NODE DISSECTION Right 04/28/2020   Procedure: NODE DISSECTION;  Surgeon: Corliss Skains, MD;  [facility];  Service: Thoracic;  Laterality: Right;   RIGHT CARPAL TUNNEL RELEASE  2011   RIGHT KNEE ARTHROSCOPY   AUG OR SEPT 2015   AT [facility]   ROOT CANAL     ROTATOR CUFF REPAIR Left    2007   THORACOSCOPY Right 04/28/2020   XI ROBOTIC ASSISTED THORASCOPY-LOBECTOMY (Right)   TONSILLECTOMY     as child   TOTAL KNEE ARTHROPLASTY Left 07/14/2013   Procedure: LEFT TOTAL KNEE ARTHROPLASTY;  Surgeon: Shelda Pal, MD;  Location: [facility] ORS;  Service: Orthopedics;  Laterality: Left;   TOTAL KNEE ARTHROPLASTY Right 07/21/2014   Procedure: RIGHT TOTAL KNEE ARTHROPLASTY;  Surgeon: Shelda Pal, MD;  Location: [facility]S;  Service: Orthopedics;  Laterality: Right;   TOTAL SHOULDER ARTHROPLASTY Right 10/17/2019   Procedure: RIGHT REVERSE TOTAL SHOULDER REPLACEMENT;  Surgeon: Beverely Low, MD;  Location: [facility]S;  Service: Orthopedics;  Laterality: Right;  interscalene block   TRIGGER  FINGER RELEASE Left    VIDEO BRONCHOSCOPY WITH ENDOBRONCHIAL NAVIGATION Right 02/24/2020   Procedure: VIDEO BRONCHOSCOPY WITH  ENDOBRONCHIAL NAVIGATION;  Surgeon: Leslye Peer, MD;  Location: [facility] ENDOSCOPY;  Service: Pulmonary;  Laterality: Right;   WEDGE RESECTION Right 04/28/2020   XI ROBOTIC ASSISTED THORASCOPY-RIGHT LOWER LOBE WEDGE RESECTION (Right Chest)    Social History   Tobacco Use  Smoking Status Former   Current packs/day: 0.00   Types: Cigarettes   Quit date: 04/30/2012   Years since quitting: 11.3  Smokeless Tobacco Never  Inches lost: 1 Pounds lost: 3.2 How fit and strong survey: 06/08/23: 38 08/28/23: 51  Taliya Mcclard B Kimba Lottes 08/30/2023, 10:41 AM

## 2023-09-06 ENCOUNTER — Ambulatory Visit: Payer: Medicare Other | Attending: Cardiology | Admitting: Cardiology

## 2023-09-06 DIAGNOSIS — Z01810 Encounter for preprocedural cardiovascular examination: Secondary | ICD-10-CM | POA: Diagnosis not present

## 2023-09-06 NOTE — Progress Notes (Signed)
Virtual Visit via Telephone Note   Because of [name] [name]'s co-morbid illnesses, he is at least at moderate risk for complications without adequate follow up.  This format is felt to be most appropriate for this patient at this time.  The patient did not have access to video technology/had technical difficulties with video requiring transitioning to audio format only (telephone).  All issues noted in this document were discussed and addressed.  No physical exam could be performed with this format.  Please refer to the patient's chart for his consent to telehealth for [facility].  Evaluation Performed:  Preoperative cardiovascular risk assessment _____________   Date:  09/06/2023   Patient ID:  [name] [name], DOB [birth year], MRN [id number] Patient Location:  Home Provider location:   Office  Primary Care Provider:  Debroah Loop, DO Primary Cardiologist:  Chrystie Nose, MD  Chief Complaint / Patient Profile   72 y.o. y/o male with a h/o HFrEF, CAD being medically managed, hyperlipidemia, mild carotid stenosis, who is pending inguinal hernia surgery with Dr. Hillery Hunter, date TBD and presents today for telephonic preoperative cardiovascular risk assessment.  History of Present Illness    [name] [name] is a 72 y.o. male who presents via audio/video conferencing for a telehealth visit today.  Pt was last seen in cardiology clinic on 04/05/23 by Dr. Rennis Golden.  At that time [name] [name] was doing well.  The patient is now pending procedure as outlined above. Since his last visit, he has remained stable from a cardiac standpoint.   Today he denies chest pain, shortness of breath, lower extremity edema, fatigue, palpitations, melena, hematuria, hemoptysis, diaphoresis, weakness, presyncope, syncope, orthopnea, and PND.   Past Medical History    Past Medical History:  Diagnosis Date   ADHD (attention deficit hyperactivity disorder)    Arthritis    Carpal tunnel syndrome     Right   Cataracts, bilateral    very mild   Coronary artery disease    Depression    Early cataracts, bilateral    Fatty liver    Gallstones    GERD (gastroesophageal reflux disease)    Headache 2011   HX OF VISUAL MIGRAINES - NO PAIN --MIGRAINES ATTRIBUTED TO PT CUTTING NICOTINE PATCH IN HALF BEFORE APPLYING TO HIS SKIN.     Hepatitis 2001   C -treated and no problems since  2001 'cured'   History of hiatal hernia    patient denies this dx as of 04/13/23   History of kidney stones    found on CT Scan   HOH (hard of hearing)    wears hearing aids   lung ca dx'd 12/06/19   RIGHT Lower  LUNG   Mild cognitive impairment 12/19/2021   Muscle cramps    hands and legs   Osteopenia    Pain    KNEE OA   Pre-diabetes    diet controlled   Right lower lobe lung mass    Shortness of breath dyspnea    "SOMETIMES FEELS LIKE I CAN'T CATCH A DEEP BREATH "- ON GOING FOR YEARS - STATES MEDICAL DOCTOR SENT HIM FOR CARDIAC WORK UP 2011 - PT STATES HEART OK   Stroke (HCC)    HX POSSIBLE TIA - RT LEG NUMBNESS AND TINGLING FINGERS AND NUMBNESS RT FACE - ALL RESOLVED ; STATES NEUROLOGIST COULD NOT FIND ANY EVIDENCE OF STROKE - THOUGHT PT'S EPISODE MIGHT BE FROM CUTTING NICOTINE PATCH IN HALF AND APPLYING TO SKIN   Trigger finger  LEFT MIDDLE FINGER   Wears glasses    Past Surgical History:  Procedure Laterality Date   ANTERIOR CERVICAL DECOMP/DISCECTOMY FUSION N/A 09/26/2022   Procedure: Cervical four-five Anterior Cervical Decompression Fusion;  Surgeon: Barnett Abu, MD;  [facility];  Service: Neurosurgery;  Laterality: N/A;   BRONCHIAL BIOPSY  02/24/2020   Procedure: BRONCHIAL BIOPSIES;  Surgeon: Leslye Peer, MD;  Location: [facility] ENDOSCOPY;  Service: Pulmonary;;   BRONCHIAL BRUSHINGS  02/24/2020   Procedure: BRONCHIAL BRUSHINGS;  Surgeon: Leslye Peer, MD;  Location: [facility] ENDOSCOPY;  Service: Pulmonary;;   BRONCHIAL NEEDLE ASPIRATION BIOPSY  02/24/2020   Procedure: BRONCHIAL NEEDLE  ASPIRATION BIOPSIES;  Surgeon: Leslye Peer, MD;  Location: [facility] ENDOSCOPY;  Service: Pulmonary;;   BRONCHIAL WASHINGS  02/24/2020   Procedure: BRONCHIAL WASHINGS;  Surgeon: Leslye Peer, MD;  Location: [facility] ENDOSCOPY;  Service: Pulmonary;;   COLONOSCOPY     CORONARY PRESSURE/FFR STUDY N/A 08/09/2022   Procedure: INTRAVASCULAR PRESSURE WIRE/FFR STUDY;  Surgeon: Orbie Pyo, MD;  [facility];  Service: Cardiovascular;  Laterality: N/A;   INTERCOSTAL NERVE BLOCK Right 04/28/2020   Procedure: INTERCOSTAL NERVE BLOCK;  Surgeon: Corliss Skains, MD;  [facility];  Service: Thoracic;  Laterality: Right;   left ankle surgery     fractured   LEFT HEART CATH AND CORONARY ANGIOGRAPHY N/A 06/29/2021   Procedure: LEFT HEART CATH AND CORONARY ANGIOGRAPHY;  Surgeon: Orbie Pyo, MD;  [facility] LAB;  Service: Cardiovascular;  Laterality: N/A;   LEFT HEART CATH AND CORONARY ANGIOGRAPHY N/A 08/09/2022   Procedure: LEFT HEART CATH AND CORONARY ANGIOGRAPHY;  Surgeon: Orbie Pyo, MD;  [facility] CV LAB;  Service: Cardiovascular;  Laterality: N/A;   left knee surgery     MENISCAL REMOVED   left shoulder     torn ligaments left shoulder   LUMBAR FUSION     L3 L4 L5   NODE DISSECTION Right 04/28/2020   Procedure: NODE DISSECTION;  Surgeon: Corliss Skains, MD;  [facility];  Service: Thoracic;  Laterality: Right;   RIGHT CARPAL TUNNEL RELEASE  2011   RIGHT KNEE ARTHROSCOPY   AUG OR SEPT 2015   AT [facility]   ROOT CANAL     ROTATOR CUFF REPAIR Left    2007   THORACOSCOPY Right 04/28/2020   XI ROBOTIC ASSISTED THORASCOPY-LOBECTOMY (Right)   TONSILLECTOMY     as child   TOTAL KNEE ARTHROPLASTY Left 07/14/2013   Procedure: LEFT TOTAL KNEE ARTHROPLASTY;  Surgeon: Shelda Pal, MD;  Location: [facility];  Service: Orthopedics;  Laterality: Left;   TOTAL KNEE ARTHROPLASTY Right 07/21/2014   Procedure: RIGHT TOTAL KNEE  ARTHROPLASTY;  Surgeon: Shelda Pal, MD;  Location: [facility];  Service: Orthopedics;  Laterality: Right;   TOTAL SHOULDER ARTHROPLASTY Right 10/17/2019   Procedure: RIGHT REVERSE TOTAL SHOULDER REPLACEMENT;  Surgeon: Beverely Low, MD;  Location: [facility];  Service: Orthopedics;  Laterality: Right;  interscalene block   TRIGGER FINGER RELEASE Left    VIDEO BRONCHOSCOPY WITH ENDOBRONCHIAL NAVIGATION Right 02/24/2020   Procedure: VIDEO BRONCHOSCOPY WITH ENDOBRONCHIAL NAVIGATION;  Surgeon: Leslye Peer, MD;  Location: [facility] ENDOSCOPY;  Service: Pulmonary;  Laterality: Right;   WEDGE RESECTION Right 04/28/2020   XI ROBOTIC ASSISTED THORASCOPY-RIGHT LOWER LOBE WEDGE RESECTION (Right Chest)     Allergies  Allergies  Allergen Reactions   Statins Other (See Comments)    MYALGIAS   Chantix [Varenicline] Other (  See Comments)    Ineffective; patient did not like the way it made him feel   Diclofenac Sodium Other (See Comments)   Tricor [Fenofibrate] Other (See Comments)    ineffective for cholesterol (2014)   Zetia [Ezetimibe] Other (See Comments)    ineffective for cholesterol   Rosuvastatin Calcium Other (See Comments)    Muscle pain    Home Medications    Prior to Admission medications   Medication Sig Start Date End Date Taking? Authorizing Provider  acetaminophen (TYLENOL) 500 MG tablet Take 1,000 mg by mouth every 6 (six) hours as needed for moderate pain or mild pain.    [provider]  amphetamine-dextroamphetamine (ADDERALL XR) 15 MG 24 hr capsule Take 15 mg by mouth daily. 08/22/23   [provider]  aspirin EC 81 MG tablet Take 81 mg by mouth daily. Swallow whole.    [provider]  atomoxetine (STRATTERA) 80 MG capsule Take 80 mg by mouth daily. Patient not taking: Reported on 08/23/2023    [provider]  clindamycin (CLEOCIN) 150 MG capsule Take 600 mg by mouth See admin instructions. Take 4 capsules (600 mg) by mouth prior to dental  appointments Patient not taking: Reported on 08/23/2023 12/23/19   [provider]  cyclobenzaprine (FLEXERIL) 5 MG tablet Take 1 tablet (5 mg total) by mouth 3 (three) times daily as needed for muscle spasms. 08/25/23   White, Elizabeth A, PA-C  Evolocumab (REPATHA SURECLICK) 140 MG/ML SOAJ Inject 140 mg into the skin every 14 (fourteen) days. 11/09/22   Hilty, Lisette Abu, MD  ibuprofen (ADVIL) 200 MG tablet Take 600 mg by mouth every 6 (six) hours as needed (pain.).    [provider]  lidocaine-prilocaine (EMLA) cream Apply 1 Application topically as needed. 08/25/23   White, Elizabeth A, PA-C  loratadine (CLARITIN) 10 MG tablet Take 10 mg by mouth daily. Patient not taking: Reported on 08/23/2023    [provider]  oxyCODONE-acetaminophen (PERCOCET) 5-325 MG tablet Take 1 tablet by mouth every 4 (four) hours as needed for severe pain. Patient not taking: Reported on 08/23/2023 04/16/23 04/15/24  Barnett Abu, MD  sertraline (ZOLOFT) 100 MG tablet Take 100 mg by mouth in the morning. 05/13/21   [provider]  tamsulosin (FLOMAX) 0.4 MG CAPS capsule Take 0.4 mg by mouth in the morning. 03/24/21   [provider]    Physical Exam    Vital Signs:  Travelle Jacobo does not have vital signs available for review today.  Given telephonic nature of communication, physical exam is limited. AAOx3. NAD. Normal affect.  Speech and respirations are unlabored.  Accessory Clinical Findings   None Assessment & Plan    1.  Preoperative Cardiovascular Risk Assessment:inguinal hernia surgery with Dr. Hillery Hunter, date TBD  Mr. Franklyn's perioperative risk of a major cardiac event is 11% according to the Revised Cardiac Risk Index (RCRI). Therefore, he is at high risk for perioperative complications.  His functional capacity is good at 7.34 METs according to the Duke Activity Status Index (DASI). Recommendations: According to ACC/AHA guidelines, no further  cardiovascular testing needed.  The patient may proceed to surgery at acceptable risk.   Antiplatelet and/or Anticoagulation Recommendations: Aspirin can be held for 5-7 days prior to his surgery.  Please resume Aspirin post operatively when it is felt to be safe from a bleeding standpoint.   The patient was advised that if he develops new symptoms prior to surgery to contact our office to arrange for  a follow-up visit, and he verbalized understanding.  A copy of this note will be routed to requesting surgeon.  Time:   Today, I have spent 6 minutes with the patient with telehealth technology discussing medical history, symptoms, and management plan.    Rip Harbour, NP  09/06/2023, 2:11 PM

## 2023-09-18 LAB — NMR, LIPOPROFILE
Cholesterol, Total: 167 mg/dL (ref 100–199)
HDL Particle Number: 31.3 umol/L (ref >=30.5)
HDL-C: 47 mg/dL (ref >39)
LDL Particle Number: 1048 nmol/L — ABNORMAL HIGH (ref <1000)
LDL Size: 20.9 nmol (ref >20.5)
LDL-C (NIH Calc): 98 mg/dL (ref 0–99)
LP-IR Score: 49 — ABNORMAL HIGH (ref <=45)
Small LDL Particle Number: 541 nmol/L — ABNORMAL HIGH (ref <=527)
Triglycerides: 123 mg/dL (ref 0–149)

## 2023-09-18 LAB — LIPOPROTEIN A (LPA): Lipoprotein (a): 231.6 nmol/L — ABNORMAL HIGH (ref <75.0)

## 2023-09-21 ENCOUNTER — Ambulatory Visit (INDEPENDENT_AMBULATORY_CARE_PROVIDER_SITE_OTHER): Payer: Medicare Other

## 2023-09-21 DIAGNOSIS — I6522 Occlusion and stenosis of left carotid artery: Secondary | ICD-10-CM | POA: Diagnosis not present

## 2023-10-10 ENCOUNTER — Other Ambulatory Visit (HOSPITAL_COMMUNITY): Payer: Medicare Other

## 2023-10-10 NOTE — Progress Notes (Signed)
Surgical Instructions   Your procedure is scheduled on Wednesday, February 5, 25. Report to [facility] Main Entrance "A" at 6:30 A.M., then check in with the Admitting office. Any questions or running late day of surgery: call [phone]  Questions prior to your surgery date: call [phone], Monday-Friday, 8am-4pm. If you experience any cold or flu symptoms such as cough, fever, chills, shortness of breath, etc. between now and your scheduled surgery, please notify us at the above number.     Remember:  Do not eat or drink after midnight the night before your surgery   Take these medicines the morning of surgery with A SIP OF WATER   sertraline (ZOLOFT)  tamsulosin [facility])   May take these medicines IF NEEDED: cyclobenzaprine (FLEXERIL)    Per your surgeon's instructions, Aspiring should be held for 5 day's prior to your surgery.  Your last dose of Aspirin should be on or before 10-11-23.   One week prior to surgery, STOP taking any Aleve, Naproxen, Motrin, Advil, Goody's, BC's, all herbal medications, fish oil, and non-prescription vitamins.                     Do NOT Smoke (Tobacco/Vaping) for 24 hours prior to your procedure.  If you use a CPAP at night, you may bring your mask/headgear for your overnight stay.   You will be asked to remove any contacts, glasses, piercing's, hearing aid's, dentures/partials prior to surgery. Please bring cases for these items if needed.    Patients discharged the day of surgery will not be allowed to drive home, and someone needs to stay with them for 24 hours.  SURGICAL WAITING ROOM VISITATION Patients may have no more than 2 support people in the waiting area - these visitors may rotate.   Pre-op nurse will coordinate an appropriate time for 1 ADULT support person, who may not rotate, to accompany patient in pre-op.  Children under the age of 38 must have an adult with them who is not the patient and must remain in the main waiting  area with an adult.  If the patient needs to stay at the hospital during part of their recovery, the visitor guidelines for inpatient rooms apply.  Please refer to the [facility] website for the visitor guidelines for any additional information.   If you received a COVID test during your pre-op visit  it is requested that you wear a mask when out in public, stay away from anyone that may not be feeling well and notify your surgeon if you develop symptoms. If you have been in contact with anyone that has tested positive in the last 10 days please notify you surgeon.      Pre-operative CHG Bathing Instructions   You can play a key role in reducing the risk of infection after surgery. Your skin needs to be as free of germs as possible. You can reduce the number of germs on your skin by washing with CHG (chlorhexidine gluconate) soap before surgery. CHG is an antiseptic soap that kills germs and continues to kill germs even after washing.   DO NOT use if you have an allergy to chlorhexidine/CHG or antibacterial soaps. If your skin becomes reddened or irritated, stop using the CHG and notify one of our RNs at [phone].              TAKE A SHOWER THE NIGHT BEFORE SURGERY AND THE DAY OF SURGERY    Please keep in mind the  following:  DO NOT shave, including legs and underarms, 48 hours prior to surgery.   You may shave your face before/day of surgery.  Place clean sheets on your bed the night before surgery Use a clean washcloth (not used since being washed) for each shower. DO NOT sleep with pet's night before surgery.  CHG Shower Instructions:  Wash your face and private area with normal soap. If you choose to wash your hair, wash first with your normal shampoo.  After you use shampoo/soap, rinse your hair and body thoroughly to remove shampoo/soap residue.  Turn the water OFF and apply half the bottle of CHG soap to a CLEAN washcloth.  Apply CHG soap ONLY FROM YOUR NECK DOWN TO YOUR  TOES (washing for 3-5 minutes)  DO NOT use CHG soap on face, private areas, open wounds, or sores.  Pay special attention to the area where your surgery is being performed.  If you are having back surgery, having someone wash your back for you may be helpful. Wait 2 minutes after CHG soap is applied, then you may rinse off the CHG soap.  Pat dry with a clean towel  Put on clean pajamas    Additional instructions for the day of surgery: DO NOT APPLY any lotions, deodorants, cologne, or perfumes.   Do not wear jewelry or makeup Do not wear nail polish, gel polish, artificial nails, or any other type of covering on natural nails (fingers and toes) Do not bring valuables to the hospital. [facility] is not responsible for valuables/personal belongings. Put on clean/comfortable clothes.  Please brush your teeth.  Ask your nurse before applying any prescription medications to the skin.

## 2023-10-11 ENCOUNTER — Encounter (HOSPITAL_COMMUNITY)
Admission: RE | Admit: 2023-10-11 | Discharge: 2023-10-11 | Disposition: A | Discharge status: Home / Self Care | Payer: Medicare Other | Source: Ambulatory Visit | Attending: General Surgery | Admitting: General Surgery

## 2023-10-11 ENCOUNTER — Encounter (HOSPITAL_COMMUNITY): Payer: Self-pay

## 2023-10-11 ENCOUNTER — Other Ambulatory Visit: Payer: Self-pay

## 2023-10-11 VITALS — BP 116/70 | HR 86 | Temp 97.8°F | Resp 18 | Ht 73.0 in | Wt 194.0 lb

## 2023-10-11 DIAGNOSIS — C3431 Malignant neoplasm of lower lobe, right bronchus or lung: Secondary | ICD-10-CM | POA: Diagnosis not present

## 2023-10-11 DIAGNOSIS — K760 Fatty (change of) liver, not elsewhere classified: Secondary | ICD-10-CM | POA: Insufficient documentation

## 2023-10-11 DIAGNOSIS — K769 Liver disease, unspecified: Secondary | ICD-10-CM | POA: Diagnosis present

## 2023-10-11 DIAGNOSIS — I2510 Atherosclerotic heart disease of native coronary artery without angina pectoris: Secondary | ICD-10-CM | POA: Diagnosis not present

## 2023-10-11 DIAGNOSIS — K449 Diaphragmatic hernia without obstruction or gangrene: Secondary | ICD-10-CM | POA: Insufficient documentation

## 2023-10-11 DIAGNOSIS — Z9889 Other specified postprocedural states: Secondary | ICD-10-CM | POA: Insufficient documentation

## 2023-10-11 DIAGNOSIS — Z8673 Personal history of transient ischemic attack (TIA), and cerebral infarction without residual deficits: Secondary | ICD-10-CM | POA: Insufficient documentation

## 2023-10-11 DIAGNOSIS — Z01818 Encounter for other preprocedural examination: Secondary | ICD-10-CM | POA: Diagnosis present

## 2023-10-11 DIAGNOSIS — Z87891 Personal history of nicotine dependence: Secondary | ICD-10-CM | POA: Diagnosis not present

## 2023-10-11 DIAGNOSIS — R7303 Prediabetes: Secondary | ICD-10-CM | POA: Insufficient documentation

## 2023-10-11 DIAGNOSIS — F909 Attention-deficit hyperactivity disorder, unspecified type: Secondary | ICD-10-CM | POA: Diagnosis not present

## 2023-10-11 DIAGNOSIS — Z96611 Presence of right artificial shoulder joint: Secondary | ICD-10-CM | POA: Diagnosis not present

## 2023-10-11 DIAGNOSIS — K219 Gastro-esophageal reflux disease without esophagitis: Secondary | ICD-10-CM | POA: Insufficient documentation

## 2023-10-11 DIAGNOSIS — Z01812 Encounter for preprocedural laboratory examination: Secondary | ICD-10-CM | POA: Insufficient documentation

## 2023-10-11 DIAGNOSIS — K4020 Bilateral inguinal hernia, without obstruction or gangrene, not specified as recurrent: Secondary | ICD-10-CM | POA: Insufficient documentation

## 2023-10-11 DIAGNOSIS — Z96653 Presence of artificial knee joint, bilateral: Secondary | ICD-10-CM | POA: Insufficient documentation

## 2023-10-11 DIAGNOSIS — Z79899 Other long term (current) drug therapy: Secondary | ICD-10-CM | POA: Insufficient documentation

## 2023-10-11 DIAGNOSIS — Z981 Arthrodesis status: Secondary | ICD-10-CM | POA: Diagnosis not present

## 2023-10-11 DIAGNOSIS — G3184 Mild cognitive impairment, so stated: Secondary | ICD-10-CM | POA: Insufficient documentation

## 2023-10-11 LAB — CBC
HCT: 45.4 % (ref 39.0–52.0)
Hemoglobin: 15 g/dL (ref 13.0–17.0)
MCH: 30.1 pg (ref 26.0–34.0)
MCHC: 33 g/dL (ref 30.0–36.0)
MCV: 91 fL (ref 80.0–100.0)
Platelets: 182 10*3/uL (ref 150–400)
RBC: 4.99 MIL/uL (ref 4.22–5.81)
RDW: 13.2 % (ref 11.5–15.5)
WBC: 8.5 10*3/uL (ref 4.0–10.5)
nRBC: 0 % (ref 0.0–0.2)

## 2023-10-11 LAB — COMPREHENSIVE METABOLIC PANEL
ALT: 20 U/L (ref 0–44)
AST: 20 U/L (ref 15–41)
Albumin: 3.8 g/dL (ref 3.5–5.0)
Alkaline Phosphatase: 102 U/L (ref 38–126)
Anion gap: 7 (ref 5–15)
BUN: 19 mg/dL (ref 8–23)
CO2: 27 mmol/L (ref 22–32)
Calcium: 9.5 mg/dL (ref 8.9–10.3)
Chloride: 105 mmol/L (ref 98–111)
Creatinine, Ser: 1.13 mg/dL (ref 0.61–1.24)
GFR, Estimated: >60 mL/min (ref >60)
Glucose, Bld: 135 mg/dL — ABNORMAL HIGH (ref 70–99)
Potassium: 4.4 mmol/L (ref 3.5–5.1)
Sodium: 139 mmol/L (ref 135–145)
Total Bilirubin: 0.8 mg/dL (ref 0.0–1.2)
Total Protein: 6.8 g/dL (ref 6.5–8.1)

## 2023-10-11 NOTE — Progress Notes (Signed)
Surgical Instructions   Your procedure is scheduled on Wednesday, October 17, 2023. Report to [facility] Main Entrance "A" at 6:30 A.M., then check in with the Admitting office. Any questions or running late day of surgery: call [phone]  Questions prior to your surgery date: call [phone], Monday-Friday, 8am-4pm. If you experience any cold or flu symptoms such as cough, fever, chills, shortness of breath, etc. between now and your scheduled surgery, please notify us at the above number.     Remember:  Do not eat or drink after midnight the night before your surgery   Take these medicines the morning of surgery with A SIP OF WATER   sertraline (ZOLOFT)  tamsulosin [facility])   May take these medicines IF NEEDED: cyclobenzaprine (FLEXERIL)    Per your doctor, STOP your Aspirin 5-7 days prior to surgery.  One week prior to surgery, STOP taking any Aleve, Naproxen, Motrin, Advil, Goody's, BC's, all herbal medications, fish oil, and non-prescription vitamins.                     Do NOT Smoke (Tobacco/Vaping) for 24 hours prior to your procedure.  If you use a CPAP at night, you may bring your mask/headgear for your overnight stay.   You will be asked to remove any contacts, glasses, piercing's, hearing aid's, dentures/partials prior to surgery. Please bring cases for these items if needed.    Patients discharged the day of surgery will not be allowed to drive home, and someone needs to stay with them for 24 hours.  SURGICAL WAITING ROOM VISITATION Patients may have no more than 2 support people in the waiting area - these visitors may rotate.   Pre-op nurse will coordinate an appropriate time for 1 ADULT support person, who may not rotate, to accompany patient in pre-op.  Children under the age of 9 must have an adult with them who is not the patient and must remain in the main waiting area with an adult.  If the patient needs to stay at the hospital during part of their  recovery, the visitor guidelines for inpatient rooms apply.  Please refer to the [facility] website for the visitor guidelines for any additional information.   If you received a COVID test during your pre-op visit  it is requested that you wear a mask when out in public, stay away from anyone that may not be feeling well and notify your surgeon if you develop symptoms. If you have been in contact with anyone that has tested positive in the last 10 days please notify you surgeon.      Pre-operative CHG Bathing Instructions   You can play a key role in reducing the risk of infection after surgery. Your skin needs to be as free of germs as possible. You can reduce the number of germs on your skin by washing with CHG (chlorhexidine gluconate) soap before surgery. CHG is an antiseptic soap that kills germs and continues to kill germs even after washing.   DO NOT use if you have an allergy to chlorhexidine/CHG or antibacterial soaps. If your skin becomes reddened or irritated, stop using the CHG and notify one of our RNs at [phone].              TAKE A SHOWER THE NIGHT BEFORE SURGERY AND THE DAY OF SURGERY    Please keep in mind the following:  DO NOT shave, including legs and underarms, 48 hours prior to surgery.   You  may shave your face before/day of surgery.  Place clean sheets on your bed the night before surgery Use a clean washcloth (not used since being washed) for each shower. DO NOT sleep with pet's night before surgery.  CHG Shower Instructions:  Wash your face and private area with normal soap. If you choose to wash your hair, wash first with your normal shampoo.  After you use shampoo/soap, rinse your hair and body thoroughly to remove shampoo/soap residue.  Turn the water OFF and apply half the bottle of CHG soap to a CLEAN washcloth.  Apply CHG soap ONLY FROM YOUR NECK DOWN TO YOUR TOES (washing for 3-5 minutes)  DO NOT use CHG soap on face, private areas, open wounds,  or sores.  Pay special attention to the area where your surgery is being performed.  If you are having back surgery, having someone wash your back for you may be helpful. Wait 2 minutes after CHG soap is applied, then you may rinse off the CHG soap.  Pat dry with a clean towel  Put on clean pajamas    Additional instructions for the day of surgery: DO NOT APPLY any lotions, deodorants, cologne, or perfumes.   Do not wear jewelry or makeup Do not wear nail polish, gel polish, artificial nails, or any other type of covering on natural nails (fingers and toes) Do not bring valuables to the hospital. [facility] is not responsible for valuables/personal belongings. Put on clean/comfortable clothes.  Please brush your teeth.  Ask your nurse before applying any prescription medications to the skin.

## 2023-10-11 NOTE — Progress Notes (Signed)
PCP - Dr. Judithann Sheen Cardiologist - Dr. Rennis Golden  Chest x-ray -  EKG - 04-05-23 Stress Test - 02-20-20 ECHO - 09-21-22 Cardiac Cath - 08-09-22  Sleep Study - Reports he had a sleep study, but was negative CPAP -   Fasting Blood Sugar - pre-diabetes, does not check sugar at home  Blood Thinner Instructions: Aspirin Instructions: MD instructions to hold ASA 5-7 days Pt reports he stopped ASA "couple weeks ago"  ERAS Protcol - NPO PRE-SURGERY Ensure or G2-   COVID TEST- n/a   Anesthesia review: Hx CAD, TIA  Patient denies shortness of breath, fever, cough and chest pain at PAT appointment

## 2023-10-12 NOTE — Anesthesia Preprocedure Evaluation (Signed)
Anesthesia Evaluation    Airway        Dental   Pulmonary former smoker          Cardiovascular      Neuro/Psych    GI/Hepatic   Endo/Other    Renal/GU      Musculoskeletal   Abdominal   Peds  Hematology   Anesthesia Other Findings   Reproductive/Obstetrics                             Anesthesia Physical Anesthesia Plan  ASA:   Anesthesia Plan:    Post-op Pain Management:    Induction:   PONV Risk Score and Plan:   Airway Management Planned:   Additional Equipment:   Intra-op Plan:   Post-operative Plan:   Informed Consent:   Plan Discussed with:   Anesthesia Plan Comments: (PAT note written 10/12/2023 by Shonna Chock, PA-C.  )       Anesthesia Quick Evaluation

## 2023-10-12 NOTE — Progress Notes (Signed)
Anesthesia Chart Review:  Case: [id number] Date/Time: 10/17/23 0815   Procedures:      XI ROBOTIC ASSISTED BILATERAL INGUINAL HERNIA WITH MESH (Bilateral) - GEN AND TAP BLOCK ROOM 10     HERNIA REPAIR UMBILICAL ADULT - GEN AND TAP BLOCK   Anesthesia type: General   Pre-op diagnosis: BILATERAL INGUINAL HERNIA, UMBILICAL HERNIA   [facility] ROOM 10 / [facility] OR   Surgeons: Moise Boring, MD       DISCUSSION: Patient is a 73 year old male scheduled for the above procedure.  History includes former smoker (quit 04/30/12), CAD (100% CTO RCA w/ collaterals by left system, bridging collaterals subtotally occluded dLCx, medical management 08/09/22), TIA (10/30/07), Stage IIIA (T4, N0, M0) non-small cell lung cancer (s/p Robotic assisted RL Lobectomy 04/28/20, chemotherapy), pre-diabetes, dyspnea, GERD, hiatal hernia, hepatitis C (treated), fatty liver, OA (s/p left TKA 07/14/13; right TKA 07/21/14; right reverse TSA 10/17/19), spinal surgery (L3-5 PLIF 03/20/17, revision 09/07/17; C4-5 ACDF 09/26/22), hard of hearing, mild cognitive impairment, ADHD.  He is followed by cardiologist Dr. Rennis Golden for CAD with LHC on 08/09/22 showing relatively unchanged burden of coronary disease with CTO RCA with collaterals by the left system and bridging collaterals and a subtotally occluded dLCx with medical therapy recommended. On 09/21/22 he had an echocardiogram that showed LVEF 35-40%, with global hypokinesis, grade 1 diastolic dysfunction, normal RVSF, trivial MR.   Preoperative Cardiovascular Risk Assessment per Reather Littler, NP on 09/06/23:  "Mr. Baris's perioperative risk of a major cardiac event is 11% according to the Revised Cardiac Risk Index (RCRI). Therefore, he is at high risk for perioperative complications.  His functional capacity is good at 7.34 METs according to the Duke Activity Status Index (DASI). Recommendations: According to ACC/AHA guidelines, no further cardiovascular testing needed.  The patient  may proceed to surgery at acceptable risk.   Antiplatelet and/or Anticoagulation Recommendations: Aspirin can be held for 5-7 days prior to his surgery.  Please resume Aspirin post operatively when it is felt to be safe from a bleeding standpoint." He reported holding ASA a couple of weeks ago.  Last oncology visit with Dr. Arbutus Ped was on 07/10/23 for follow-up lung cancer, s/p RL lobectomy and chemotherapy in 2021. He underwent vertebral body biopsies L4 & L5 on 04/16/23 due to lyttic lesion at L3 and L5 on 12/21/22 CT that were suggestive of reactive to pedicel screws but metastatic disease or multiple myeloma were also in the differential. 04/04/23 PET scan also showed hypermetabolism; However, biopsies results came back negative for carcinoma. 06/22/23 CT showed stable 6 mm RLL nodule with no evidence of recurrence or metastatic disease. Continue observation planned with six month follow-up with labs and scan.  Anesthesia team to evaluate on the day of surgery.    VS: BP 116/70   Pulse 86   Temp 36.6 C   Resp 18   Ht 6\' 1"  (1.854 m)   Wt 88 kg   SpO2 97%   BMI 25.60 kg/m    PROVIDERS: Debroah Loop, DO is PCP Chrystie Nose, MD is cardiologist Brynda Greathouse, MD CT surgeon Si Gaul, MD is HEM-ONC Windell Norfolk, MD is neurologist   LABS: Labs reviewed: Acceptable for surgery. Last A1c results seen is 5.8% on 09/08/22. (all labs ordered are listed, but only abnormal results are displayed)  Labs Reviewed  COMPREHENSIVE METABOLIC PANEL - Abnormal; Notable for the following components:      Result Value   Glucose, Bld 135 (*)    All  other components within normal limits  CBC     IMAGES: CT Chest 06/22/23: IMPRESSION: 1. Status post right lower lobectomy. No signs of local recurrence or metastatic disease within the chest. 2. Stable 6 mm right lower lobe nodule. 3. Stable scattered enhancing nodules within the spleen. 4. Cholelithiasis. 5. Coronary artery  calcifications. 6. Partially visualized postoperative changes involving the lumbar spine with underlying lucent bone lesion. 7. Aortic Atherosclerosis (ICD10-I70.0) and Emphysema (ICD10-J43.9).   PET Scan 04/04/23: IMPRESSION: 1. Status post right lower lobectomy. No evidence of local recurrence or extraosseous metastatic disease. 2. New hypermetabolic activity within the lumbar spine with new osseous lucencies status post previous L3-5 PLIF, as seen on recent lumbar spine imaging. No definite signs of acute inflammation or hardware loosening. Findings are concerning for metastatic disease or multiple myeloma. Tissue sampling recommended. 3. No other evidence of metastatic disease. 4. Aortic Atherosclerosis (ICD10-I70.0) and Emphysema (ICD10-J43.9).   MRI Brain 01/23/23: IMPRESSION: 1. No evidence of intracranial metastatic disease. 2. Mild chronic small vessel ischemic changes within the cerebral white matter.   CT L-spine 12/21/22: IMPRESSION: Bubbly lytic lesions at L3 and L5 bodies which are centered on the left L3 and bilateral L5 pedicle screws. This pattern suggests findings are reactive to the pedicle screws, although arthrodesis is solid band the appearance atypical. Need close follow-up based on history of lung cancer, consider systemic metastatic workup. Based on the lesion morphology alone, multiple myeloma can have a similar appearance and directed lab analysis is also suggested. - Adjacent segment degeneration as seen by recent MRI.   EKG: 04/05/23:  Normal sinus rhythm Left axis deviation Inferior infarct , age undetermined When compared with ECG of 09-Aug-2022 09:08, Premature ventricular complexes are no longer Present Confirmed by Zoila Shutter [id number]) on 04/05/2023 11:37:32 AM   CV: US Carotid 09/20/2022 Summary:  - Right Carotid: The extracranial vessels were near-normal with only minimal wall thickening or plaque.  - Left Carotid: The extracranial  vessels were near-normal with only minimal wall thickening or plaque.   - Vertebrals: Bilateral vertebral arteries demonstrate antegrade flow.  - Subclavians: Normal flow hemodynamics were seen in bilateral subclavian arteries.   Echo 09/21/2022 IMPRESSIONS   1. Left ventricular ejection fraction, by estimation, is 35 to 40%. Left  ventricular ejection fraction by 3D volume is 39 %. The left ventricle has  moderately decreased function. The left ventricle demonstrates global  hypokinesis. There is mild left  ventricular hypertrophy. Left ventricular diastolic parameters are  consistent with Grade I diastolic dysfunction (impaired relaxation).   2. Right ventricular systolic function is normal. The right ventricular  size is normal. Tricuspid regurgitation signal is inadequate for assessing  PA pressure.   3. The mitral valve is abnormal. Trivial mitral valve regurgitation.   4. The aortic valve is tricuspid. Aortic valve regurgitation is not  visualized. Aortic valve sclerosis is present, with no evidence of aortic  valve stenosis.   5. The inferior vena cava is normal in size with greater than 50%  respiratory variability, suggesting right atrial pressure of 3 mmHg.  - Comparison echocardiogram 05/11/2011: LVEF 70.1%, trivial TR/TR, grade 1 diastolic dysfunction.      Cardiac Cath 08/09/2022   Ost LAD lesion is 15% stenosed.   Mid LAD lesion is 20% stenosed.   Dist Cx lesion is 90% stenosed.   Prox RCA lesion is 100% stenosed.   1st Mrg lesion is 40% stenosed.   1.  Relatively unchanged burden of disease with chronic total occlusion of  right coronary artery collateralized by the left system and bridging collaterals and a subtotally occluded distal left circumflex which subtends a very small area.   2.  RFR assessment of the first obtuse marginal was 0.92; medical management should be pursued. 3.  LVEDP of 10 mmHg.   Recommendation: Medical management.   Past Medical History:   Diagnosis Date   ADHD (attention deficit hyperactivity disorder)    Arthritis    Carpal tunnel syndrome    Right   Cataracts, bilateral    very mild   Coronary artery disease    Depression    Early cataracts, bilateral    Fatty liver    Gallstones    GERD (gastroesophageal reflux disease)    Headache 2011   HX OF VISUAL MIGRAINES - NO PAIN --MIGRAINES ATTRIBUTED TO PT CUTTING NICOTINE PATCH IN HALF BEFORE APPLYING TO HIS SKIN.     Hepatitis 2001   C -treated and no problems since  2001 'cured'   History of hiatal hernia    patient denies this dx as of 04/13/23   History of kidney stones    found on CT Scan   HOH (hard of hearing)    wears hearing aids   lung ca dx'd 12/06/19   RIGHT Lower  LUNG   Mild cognitive impairment 12/19/2021   Muscle cramps    hands and legs   Osteopenia    Pain    KNEE OA   Pre-diabetes    diet controlled   Right lower lobe lung mass    Shortness of breath dyspnea    "SOMETIMES FEELS LIKE I CAN'T CATCH A DEEP BREATH "- ON GOING FOR YEARS - STATES MEDICAL DOCTOR SENT HIM FOR CARDIAC WORK UP 2011 - PT STATES HEART OK   Stroke (HCC)    HX POSSIBLE TIA - RT LEG NUMBNESS AND TINGLING FINGERS AND NUMBNESS RT FACE - ALL RESOLVED ; STATES NEUROLOGIST COULD NOT FIND ANY EVIDENCE OF STROKE - THOUGHT PT'S EPISODE MIGHT BE FROM CUTTING NICOTINE PATCH IN HALF AND APPLYING TO SKIN   Trigger finger    LEFT MIDDLE FINGER   Wears glasses     Past Surgical History:  Procedure Laterality Date   ANTERIOR CERVICAL DECOMP/DISCECTOMY FUSION N/A 09/26/2022   Procedure: Cervical four-five Anterior Cervical Decompression Fusion;  Surgeon: Barnett Abu, MD;  Location: [facility] OR;  Service: Neurosurgery;  Laterality: N/A;   BRONCHIAL BIOPSY  02/24/2020   Procedure: BRONCHIAL BIOPSIES;  Surgeon: Leslye Peer, MD;  Location: [facility] ENDOSCOPY;  Service: Pulmonary;;   BRONCHIAL BRUSHINGS  02/24/2020   Procedure: BRONCHIAL BRUSHINGS;  Surgeon: Leslye Peer, MD;  Location: [facility]  ENDOSCOPY;  Service: Pulmonary;;   BRONCHIAL NEEDLE ASPIRATION BIOPSY  02/24/2020   Procedure: BRONCHIAL NEEDLE ASPIRATION BIOPSIES;  Surgeon: Leslye Peer, MD;  Location: [facility] ENDOSCOPY;  Service: Pulmonary;;   BRONCHIAL WASHINGS  02/24/2020   Procedure: BRONCHIAL WASHINGS;  Surgeon: Leslye Peer, MD;  Location: [facility] ENDOSCOPY;  Service: Pulmonary;;   COLONOSCOPY     CORONARY PRESSURE/FFR STUDY N/A 08/09/2022   Procedure: INTRAVASCULAR PRESSURE WIRE/FFR STUDY;  Surgeon: Orbie Pyo, MD;  [facility] INVASIVE CV LAB;  Service: Cardiovascular;  Laterality: N/A;   INTERCOSTAL NERVE BLOCK Right 04/28/2020   Procedure: INTERCOSTAL NERVE BLOCK;  Surgeon: Corliss Skains, MD;  Location: [facility] OR;  Service: Thoracic;  Laterality: Right;   left ankle surgery     fractured   LEFT HEART CATH AND CORONARY ANGIOGRAPHY N/A 06/29/2021  Procedure: LEFT HEART CATH AND CORONARY ANGIOGRAPHY;  Surgeon: Orbie Pyo, MD;  [facility] INVASIVE CV LAB;  Service: Cardiovascular;  Laterality: N/A;   LEFT HEART CATH AND CORONARY ANGIOGRAPHY N/A 08/09/2022   Procedure: LEFT HEART CATH AND CORONARY ANGIOGRAPHY;  Surgeon: Orbie Pyo, MD;  [facility] INVASIVE CV LAB;  Service: Cardiovascular;  Laterality: N/A;   left knee surgery     MENISCAL REMOVED   left shoulder     torn ligaments left shoulder   LUMBAR FUSION     L3 L4 L5   NODE DISSECTION Right 04/28/2020   Procedure: NODE DISSECTION;  Surgeon: Corliss Skains, MD;  Location: [facility] OR;  Service: Thoracic;  Laterality: Right;   RIGHT CARPAL TUNNEL RELEASE  2011   RIGHT KNEE ARTHROSCOPY   AUG OR SEPT 2015   AT [facility]   ROOT CANAL     ROTATOR CUFF REPAIR Left    2007   THORACOSCOPY Right 04/28/2020   XI ROBOTIC ASSISTED THORASCOPY-LOBECTOMY (Right)   TONSILLECTOMY     as child   TOTAL KNEE ARTHROPLASTY Left 07/14/2013   Procedure: LEFT TOTAL KNEE ARTHROPLASTY;  Surgeon: Shelda Pal, MD;  Location: [facility] ORS;   Service: Orthopedics;  Laterality: Left;   TOTAL KNEE ARTHROPLASTY Right 07/21/2014   Procedure: RIGHT TOTAL KNEE ARTHROPLASTY;  Surgeon: Shelda Pal, MD;  Location: [facility] ORS;  Service: Orthopedics;  Laterality: Right;   TOTAL SHOULDER ARTHROPLASTY Right 10/17/2019   Procedure: RIGHT REVERSE TOTAL SHOULDER REPLACEMENT;  Surgeon: Beverely Low, MD;  Location: [facility] ORS;  Service: Orthopedics;  Laterality: Right;  interscalene block   TRIGGER FINGER RELEASE Left    VIDEO BRONCHOSCOPY WITH ENDOBRONCHIAL NAVIGATION Right 02/24/2020   Procedure: VIDEO BRONCHOSCOPY WITH ENDOBRONCHIAL NAVIGATION;  Surgeon: Leslye Peer, MD;  Location: [facility] ENDOSCOPY;  Service: Pulmonary;  Laterality: Right;   WEDGE RESECTION Right 04/28/2020   XI ROBOTIC ASSISTED THORASCOPY-RIGHT LOWER LOBE WEDGE RESECTION (Right Chest)     MEDICATIONS:  ADDERALL XR 25 MG 24 hr capsule   aspirin EC 81 MG tablet   clindamycin (CLEOCIN) 150 MG capsule   cyclobenzaprine (FLEXERIL) 5 MG tablet   Evolocumab (REPATHA SURECLICK) 140 MG/ML SOAJ   ibuprofen (ADVIL) 200 MG tablet   lidocaine-prilocaine (EMLA) cream   oxyCODONE-acetaminophen (PERCOCET) 5-325 MG tablet   sertraline (ZOLOFT) 100 MG tablet   tamsulosin (FLOMAX) 0.4 MG CAPS capsule   No current facility-administered medications for this encounter.  Clindamycin is as needed for dental procedures.   Shonna Chock, PA-C Surgical Short Stay/Anesthesiology [facility] Phone [phone] [facility] Phone [phone] 10/12/2023 6:08 PM

## 2023-10-15 ENCOUNTER — Inpatient Hospital Stay: Payer: Medicare Other | Attending: Internal Medicine

## 2023-10-15 ENCOUNTER — Ambulatory Visit (HOSPITAL_COMMUNITY)
Admission: RE | Admit: 2023-10-15 | Discharge: 2023-10-15 | Disposition: A | Discharge status: Home / Self Care | Payer: Medicare Other | Source: Ambulatory Visit | Attending: Internal Medicine | Admitting: Internal Medicine

## 2023-10-15 DIAGNOSIS — M899 Disorder of bone, unspecified: Secondary | ICD-10-CM | POA: Insufficient documentation

## 2023-10-15 DIAGNOSIS — Z902 Acquired absence of lung [part of]: Secondary | ICD-10-CM | POA: Insufficient documentation

## 2023-10-15 DIAGNOSIS — C3490 Malignant neoplasm of unspecified part of unspecified bronchus or lung: Secondary | ICD-10-CM | POA: Insufficient documentation

## 2023-10-15 DIAGNOSIS — R911 Solitary pulmonary nodule: Secondary | ICD-10-CM | POA: Diagnosis not present

## 2023-10-15 DIAGNOSIS — M25551 Pain in right hip: Secondary | ICD-10-CM | POA: Insufficient documentation

## 2023-10-15 DIAGNOSIS — Z85118 Personal history of other malignant neoplasm of bronchus and lung: Secondary | ICD-10-CM | POA: Insufficient documentation

## 2023-10-15 LAB — CMP (CANCER CENTER ONLY)
ALT: 15 U/L (ref 0–44)
AST: 18 U/L (ref 15–41)
Albumin: 4.4 g/dL (ref 3.5–5.0)
Alkaline Phosphatase: 106 U/L (ref 38–126)
Anion gap: 4 — ABNORMAL LOW (ref 5–15)
BUN: 25 mg/dL — ABNORMAL HIGH (ref 8–23)
CO2: 30 mmol/L (ref 22–32)
Calcium: 9.4 mg/dL (ref 8.9–10.3)
Chloride: 104 mmol/L (ref 98–111)
Creatinine: 1.11 mg/dL (ref 0.61–1.24)
GFR, Estimated: >60 mL/min (ref >60)
Glucose, Bld: 100 mg/dL — ABNORMAL HIGH (ref 70–99)
Potassium: 4.8 mmol/L (ref 3.5–5.1)
Sodium: 138 mmol/L (ref 135–145)
Total Bilirubin: 0.5 mg/dL (ref 0.0–1.2)
Total Protein: 6.9 g/dL (ref 6.5–8.1)

## 2023-10-15 LAB — CBC WITH DIFFERENTIAL (CANCER CENTER ONLY)
Abs Immature Granulocytes: 0.02 10*3/uL (ref 0.00–0.07)
Basophils Absolute: 0 10*3/uL (ref 0.0–0.1)
Basophils Relative: 0 %
Eosinophils Absolute: 0.1 10*3/uL (ref 0.0–0.5)
Eosinophils Relative: 1 %
HCT: 45.9 % (ref 39.0–52.0)
Hemoglobin: 15.1 g/dL (ref 13.0–17.0)
Immature Granulocytes: 0 %
Lymphocytes Relative: 13 %
Lymphs Abs: 1 10*3/uL (ref 0.7–4.0)
MCH: 29.8 pg (ref 26.0–34.0)
MCHC: 32.9 g/dL (ref 30.0–36.0)
MCV: 90.5 fL (ref 80.0–100.0)
Monocytes Absolute: 0.6 10*3/uL (ref 0.1–1.0)
Monocytes Relative: 7 %
Neutro Abs: 6.1 10*3/uL (ref 1.7–7.7)
Neutrophils Relative %: 79 %
Platelet Count: 174 10*3/uL (ref 150–400)
RBC: 5.07 MIL/uL (ref 4.22–5.81)
RDW: 13.2 % (ref 11.5–15.5)
WBC Count: 7.8 10*3/uL (ref 4.0–10.5)
nRBC: 0 % (ref 0.0–0.2)

## 2023-10-15 MED ORDER — IOHEXOL 300 MG/ML  SOLN
75.0000 mL | Freq: Once | INTRAMUSCULAR | Status: AC | PRN
  Administered 2023-10-15: 75 mL via INTRAVENOUS

## 2023-10-17 ENCOUNTER — Ambulatory Visit (HOSPITAL_BASED_OUTPATIENT_CLINIC_OR_DEPARTMENT_OTHER): Payer: Medicare Other | Admitting: Certified Registered"

## 2023-10-17 ENCOUNTER — Ambulatory Visit (HOSPITAL_COMMUNITY): Payer: Self-pay | Admitting: Vascular Surgery

## 2023-10-17 ENCOUNTER — Ambulatory Visit (HOSPITAL_COMMUNITY)
Admission: RE | Admit: 2023-10-17 | Discharge: 2023-10-17 | Disposition: A | Discharge status: Home / Self Care | Payer: Medicare Other | Attending: General Surgery | Admitting: General Surgery

## 2023-10-17 ENCOUNTER — Encounter (HOSPITAL_COMMUNITY)
Admission: RE | Disposition: A | Discharge status: Home / Self Care | Payer: Self-pay | Source: Home / Self Care | Attending: General Surgery

## 2023-10-17 ENCOUNTER — Encounter (HOSPITAL_COMMUNITY): Payer: Self-pay | Admitting: General Surgery

## 2023-10-17 ENCOUNTER — Other Ambulatory Visit: Payer: Medicare Other

## 2023-10-17 ENCOUNTER — Other Ambulatory Visit: Payer: Self-pay

## 2023-10-17 DIAGNOSIS — Z9221 Personal history of antineoplastic chemotherapy: Secondary | ICD-10-CM | POA: Diagnosis not present

## 2023-10-17 DIAGNOSIS — M1990 Unspecified osteoarthritis, unspecified site: Secondary | ICD-10-CM | POA: Diagnosis not present

## 2023-10-17 DIAGNOSIS — Z87891 Personal history of nicotine dependence: Secondary | ICD-10-CM | POA: Insufficient documentation

## 2023-10-17 DIAGNOSIS — K429 Umbilical hernia without obstruction or gangrene: Secondary | ICD-10-CM | POA: Insufficient documentation

## 2023-10-17 DIAGNOSIS — I509 Heart failure, unspecified: Secondary | ICD-10-CM | POA: Diagnosis not present

## 2023-10-17 DIAGNOSIS — Z79899 Other long term (current) drug therapy: Secondary | ICD-10-CM | POA: Insufficient documentation

## 2023-10-17 DIAGNOSIS — K219 Gastro-esophageal reflux disease without esophagitis: Secondary | ICD-10-CM | POA: Diagnosis not present

## 2023-10-17 DIAGNOSIS — K449 Diaphragmatic hernia without obstruction or gangrene: Secondary | ICD-10-CM | POA: Diagnosis not present

## 2023-10-17 DIAGNOSIS — Z7982 Long term (current) use of aspirin: Secondary | ICD-10-CM | POA: Diagnosis not present

## 2023-10-17 DIAGNOSIS — R519 Headache, unspecified: Secondary | ICD-10-CM | POA: Diagnosis not present

## 2023-10-17 DIAGNOSIS — I110 Hypertensive heart disease with heart failure: Secondary | ICD-10-CM | POA: Diagnosis not present

## 2023-10-17 DIAGNOSIS — D176 Benign lipomatous neoplasm of spermatic cord: Secondary | ICD-10-CM | POA: Insufficient documentation

## 2023-10-17 DIAGNOSIS — F32A Depression, unspecified: Secondary | ICD-10-CM | POA: Diagnosis not present

## 2023-10-17 DIAGNOSIS — I2510 Atherosclerotic heart disease of native coronary artery without angina pectoris: Secondary | ICD-10-CM | POA: Diagnosis not present

## 2023-10-17 DIAGNOSIS — Z9089 Acquired absence of other organs: Secondary | ICD-10-CM | POA: Diagnosis not present

## 2023-10-17 DIAGNOSIS — I358 Other nonrheumatic aortic valve disorders: Secondary | ICD-10-CM | POA: Diagnosis not present

## 2023-10-17 DIAGNOSIS — K4090 Unilateral inguinal hernia, without obstruction or gangrene, not specified as recurrent: Secondary | ICD-10-CM | POA: Diagnosis not present

## 2023-10-17 DIAGNOSIS — I10 Essential (primary) hypertension: Secondary | ICD-10-CM

## 2023-10-17 DIAGNOSIS — Z85118 Personal history of other malignant neoplasm of bronchus and lung: Secondary | ICD-10-CM | POA: Insufficient documentation

## 2023-10-17 HISTORY — PX: UMBILICAL HERNIA REPAIR: SHX196

## 2023-10-17 SURGERY — REPAIR, HERNIA, INGUINAL, BILATERAL, ROBOT-ASSISTED
Anesthesia: General | Site: Abdomen | Laterality: Right

## 2023-10-17 MED ORDER — BUPIVACAINE-EPINEPHRINE (PF) 0.25% -1:200000 IJ SOLN
INTRAMUSCULAR | Status: AC
  Filled 2023-10-17: qty 30

## 2023-10-17 MED ORDER — LIDOCAINE 2% (20 MG/ML) 5 ML SYRINGE
INTRAMUSCULAR | Status: DC | PRN
  Administered 2023-10-17: 60 mg via INTRAVENOUS

## 2023-10-17 MED ORDER — DEXAMETHASONE SODIUM PHOSPHATE 10 MG/ML IJ SOLN
INTRAMUSCULAR | Status: DC | PRN
  Administered 2023-10-17: 5 mg via INTRAVENOUS

## 2023-10-17 MED ORDER — BUPIVACAINE LIPOSOME 1.3 % IJ SUSP: 20.0000 mL | Freq: Once | INTRAMUSCULAR | Status: DC

## 2023-10-17 MED ORDER — CHLORHEXIDINE GLUCONATE CLOTH 2 % EX PADS: 6.0000 | MEDICATED_PAD | Freq: Once | CUTANEOUS | Status: DC

## 2023-10-17 MED ORDER — HYDROMORPHONE HCL 1 MG/ML IJ SOLN
0.2500 mg | INTRAMUSCULAR | Status: DC | PRN
  Administered 2023-10-17 (×4): 0.5 mg via INTRAVENOUS

## 2023-10-17 MED ORDER — HYDROMORPHONE HCL 1 MG/ML IJ SOLN
INTRAMUSCULAR | Status: AC
  Filled 2023-10-17: qty 1

## 2023-10-17 MED ORDER — IBUPROFEN 200 MG PO TABS
600.0000 mg | ORAL_TABLET | Freq: Four times a day (QID) | ORAL | 0 refills | Status: AC
Start: 2023-10-17 — End: 2023-10-23

## 2023-10-17 MED ORDER — ONDANSETRON HCL 4 MG/2ML IJ SOLN
INTRAMUSCULAR | Status: DC | PRN
  Administered 2023-10-17: 4 mg via INTRAVENOUS

## 2023-10-17 MED ORDER — OXYCODONE HCL 5 MG PO TABS: 5.0000 mg | ORAL_TABLET | Freq: Three times a day (TID) | ORAL | 0 refills | Status: AC | PRN

## 2023-10-17 MED ORDER — CHLORHEXIDINE GLUCONATE 0.12 % MT SOLN
15.0000 mL | Freq: Once | OROMUCOSAL | Status: AC
  Administered 2023-10-17: 15 mL via OROMUCOSAL
  Filled 2023-10-17: qty 15

## 2023-10-17 MED ORDER — SODIUM CHLORIDE 0.9 % IV SOLN: 250.0000 mL | INTRAVENOUS | Status: DC | PRN

## 2023-10-17 MED ORDER — ACETAMINOPHEN 500 MG PO TABS: 1000.0000 mg | ORAL_TABLET | ORAL | Status: DC

## 2023-10-17 MED ORDER — ROCURONIUM BROMIDE 10 MG/ML (PF) SYRINGE
PREFILLED_SYRINGE | INTRAVENOUS | Status: AC
  Filled 2023-10-17: qty 10

## 2023-10-17 MED ORDER — PROPOFOL 10 MG/ML IV BOLUS
INTRAVENOUS | Status: DC | PRN
  Administered 2023-10-17: 150 mg via INTRAVENOUS

## 2023-10-17 MED ORDER — LACTATED RINGERS IV SOLN: INTRAVENOUS | Status: DC

## 2023-10-17 MED ORDER — FENTANYL CITRATE (PF) 250 MCG/5ML IJ SOLN
INTRAMUSCULAR | Status: AC
  Filled 2023-10-17: qty 5

## 2023-10-17 MED ORDER — SODIUM CHLORIDE (PF) 0.9 % IJ SOLN
INTRAMUSCULAR | Status: DC | PRN
  Administered 2023-10-17: 20 mL

## 2023-10-17 MED ORDER — EPHEDRINE 5 MG/ML INJ
INTRAVENOUS | Status: AC
  Filled 2023-10-17: qty 5

## 2023-10-17 MED ORDER — MORPHINE SULFATE (PF) 2 MG/ML IV SOLN: 2.0000 mg | INTRAVENOUS | Status: DC | PRN

## 2023-10-17 MED ORDER — PHENYLEPHRINE 80 MCG/ML (10ML) SYRINGE FOR IV PUSH (FOR BLOOD PRESSURE SUPPORT)
PREFILLED_SYRINGE | INTRAVENOUS | Status: DC | PRN
  Administered 2023-10-17: 160 ug via INTRAVENOUS

## 2023-10-17 MED ORDER — DEXAMETHASONE SODIUM PHOSPHATE 10 MG/ML IJ SOLN
INTRAMUSCULAR | Status: AC
  Filled 2023-10-17: qty 1

## 2023-10-17 MED ORDER — SUGAMMADEX SODIUM 200 MG/2ML IV SOLN
INTRAVENOUS | Status: DC | PRN
  Administered 2023-10-17: 200 mg via INTRAVENOUS

## 2023-10-17 MED ORDER — OXYCODONE HCL 5 MG PO TABS
5.0000 mg | ORAL_TABLET | Freq: Once | ORAL | Status: AC | PRN
  Administered 2023-10-17: 5 mg via ORAL

## 2023-10-17 MED ORDER — ACETAMINOPHEN 500 MG PO TABS
1000.0000 mg | ORAL_TABLET | Freq: Once | ORAL | Status: AC
  Administered 2023-10-17: 1000 mg via ORAL
  Filled 2023-10-17: qty 2

## 2023-10-17 MED ORDER — LIDOCAINE 2% (20 MG/ML) 5 ML SYRINGE
INTRAMUSCULAR | Status: AC
  Filled 2023-10-17: qty 5

## 2023-10-17 MED ORDER — CEFAZOLIN SODIUM-DEXTROSE 2-4 GM/100ML-% IV SOLN
2.0000 g | INTRAVENOUS | Status: AC
  Administered 2023-10-17: 2 g via INTRAVENOUS
  Filled 2023-10-17: qty 100

## 2023-10-17 MED ORDER — GABAPENTIN 300 MG PO CAPS
300.0000 mg | ORAL_CAPSULE | ORAL | Status: AC
  Administered 2023-10-17: 300 mg via ORAL
  Filled 2023-10-17: qty 1

## 2023-10-17 MED ORDER — SODIUM CHLORIDE 0.9% FLUSH: 3.0000 mL | INTRAVENOUS | Status: DC | PRN

## 2023-10-17 MED ORDER — OXYCODONE HCL 5 MG PO TABS
ORAL_TABLET | ORAL | Status: AC
  Filled 2023-10-17: qty 1

## 2023-10-17 MED ORDER — ACETAMINOPHEN 650 MG RE SUPP: 650.0000 mg | RECTAL | Status: DC | PRN

## 2023-10-17 MED ORDER — PROPOFOL 10 MG/ML IV BOLUS
INTRAVENOUS | Status: AC
  Filled 2023-10-17: qty 20

## 2023-10-17 MED ORDER — ACETAMINOPHEN 325 MG PO TABS: 650.0000 mg | ORAL_TABLET | ORAL | Status: DC | PRN

## 2023-10-17 MED ORDER — BUPIVACAINE LIPOSOME 1.3 % IJ SUSP
INTRAMUSCULAR | Status: AC
  Filled 2023-10-17: qty 20

## 2023-10-17 MED ORDER — ONDANSETRON HCL 4 MG/2ML IJ SOLN: 4.0000 mg | Freq: Once | INTRAMUSCULAR | Status: DC | PRN

## 2023-10-17 MED ORDER — ONDANSETRON HCL 4 MG/2ML IJ SOLN
INTRAMUSCULAR | Status: AC
  Filled 2023-10-17: qty 2

## 2023-10-17 MED ORDER — OXYCODONE HCL 5 MG/5ML PO SOLN: 5.0000 mg | Freq: Once | ORAL | Status: AC | PRN

## 2023-10-17 MED ORDER — SUCCINYLCHOLINE CHLORIDE 200 MG/10ML IV SOSY
PREFILLED_SYRINGE | INTRAVENOUS | Status: AC
  Filled 2023-10-17: qty 10

## 2023-10-17 MED ORDER — BUPIVACAINE LIPOSOME 1.3 % IJ SUSP
INTRAMUSCULAR | Status: DC | PRN
  Administered 2023-10-17: 20 mL

## 2023-10-17 MED ORDER — ORAL CARE MOUTH RINSE: 15.0000 mL | Freq: Once | OROMUCOSAL | Status: AC

## 2023-10-17 MED ORDER — AMISULPRIDE (ANTIEMETIC) 5 MG/2ML IV SOLN: 10.0000 mg | Freq: Once | INTRAVENOUS | Status: DC | PRN

## 2023-10-17 MED ORDER — SODIUM CHLORIDE 0.9% FLUSH: 3.0000 mL | Freq: Two times a day (BID) | INTRAVENOUS | Status: DC

## 2023-10-17 MED ORDER — EPHEDRINE SULFATE-NACL 50-0.9 MG/10ML-% IV SOSY
PREFILLED_SYRINGE | INTRAVENOUS | Status: DC | PRN
  Administered 2023-10-17: 5 mg via INTRAVENOUS

## 2023-10-17 MED ORDER — BUPIVACAINE-EPINEPHRINE 0.25% -1:200000 IJ SOLN
INTRAMUSCULAR | Status: DC | PRN
  Administered 2023-10-17: 15 mL

## 2023-10-17 MED ORDER — 0.9 % SODIUM CHLORIDE (POUR BTL) OPTIME
TOPICAL | Status: DC | PRN
  Administered 2023-10-17: 1000 mL

## 2023-10-17 MED ORDER — OXYCODONE HCL 5 MG PO TABS: 5.0000 mg | ORAL_TABLET | ORAL | Status: DC | PRN

## 2023-10-17 MED ORDER — ROCURONIUM BROMIDE 10 MG/ML (PF) SYRINGE
PREFILLED_SYRINGE | INTRAVENOUS | Status: DC | PRN
  Administered 2023-10-17: 70 mg via INTRAVENOUS

## 2023-10-17 MED ORDER — ACETAMINOPHEN 325 MG PO TABS
650.0000 mg | ORAL_TABLET | Freq: Four times a day (QID) | ORAL | 0 refills | Status: AC
Start: 2023-10-17 — End: 2023-10-23

## 2023-10-17 SURGICAL SUPPLY — 49 items
BAG COUNTER SPONGE SURGICOUNT (BAG) ×2 IMPLANT
BLADE CLIPPER SURG (BLADE) IMPLANT
CANISTER SUCT 3000ML PPV (MISCELLANEOUS) IMPLANT
CHLORAPREP W/TINT 26 (MISCELLANEOUS) ×2 IMPLANT
COVER MAYO STAND STRL (DRAPES) ×2 IMPLANT
COVER SURGICAL LIGHT HANDLE (MISCELLANEOUS) ×2 IMPLANT
COVER TIP SHEARS 8 DVNC (MISCELLANEOUS) ×2 IMPLANT
DEFOGGER SCOPE WARMER CLEARIFY (MISCELLANEOUS) IMPLANT
DERMABOND ADVANCED .7 DNX12 (GAUZE/BANDAGES/DRESSINGS) ×2 IMPLANT
DRAPE ARM DVNC X/XI (DISPOSABLE) ×8 IMPLANT
DRAPE COLUMN DVNC XI (DISPOSABLE) ×2 IMPLANT
DRAPE CV SPLIT W-CLR ANES SCRN (DRAPES) ×2 IMPLANT
DRAPE SURG ORHT 6 SPLT 77X108 (DRAPES) ×2 IMPLANT
DRIVER NDL MEGA SUTCUT DVNCXI (INSTRUMENTS) ×2 IMPLANT
DRIVER NDLE MEGA SUTCUT DVNCXI (INSTRUMENTS) ×2
ELECT REM PT RETURN 9FT ADLT (ELECTROSURGICAL) ×2
ELECTRODE REM PT RTRN 9FT ADLT (ELECTROSURGICAL) ×2 IMPLANT
FORCEPS BPLR 8 MD DVNC XI (FORCEP) IMPLANT
FORCEPS BPLR FENES DVNC XI (FORCEP) ×2 IMPLANT
GLOVE BIO SURGEON STRL SZ7 (GLOVE) ×2 IMPLANT
GLOVE BIOGEL PI IND STRL 7.5 (GLOVE) ×2 IMPLANT
GOWN STRL REUS W/ TWL LRG LVL3 (GOWN DISPOSABLE) ×4 IMPLANT
GOWN STRL REUS W/TWL XL LVL3 (GOWN DISPOSABLE) ×2 IMPLANT
KIT BASIN OR (CUSTOM PROCEDURE TRAY) ×2 IMPLANT
KIT TURNOVER KIT B (KITS) ×2 IMPLANT
MARKER SKIN DUAL TIP RULER LAB (MISCELLANEOUS) ×2 IMPLANT
MESH 3DMAX MID 5X7 RT XLRG (Mesh General) IMPLANT
NDL HYPO 22X1.5 SAFETY MO (MISCELLANEOUS) ×2 IMPLANT
NDL HYPO 25GX1X1/2 BEV (NEEDLE) ×2 IMPLANT
NDL INSUFFLATION 14GA 120MM (NEEDLE) ×2 IMPLANT
NEEDLE HYPO 22X1.5 SAFETY MO (MISCELLANEOUS) ×2
NEEDLE HYPO 25GX1X1/2 BEV (NEEDLE) ×2
NEEDLE INSUFFLATION 14GA 120MM (NEEDLE) ×2
NS IRRIG 1000ML POUR BTL (IV SOLUTION) ×2 IMPLANT
OBTURATOR OPTICAL STND 8 DVNC (TROCAR) ×2
OBTURATOR OPTICALSTD 8 DVNC (TROCAR) ×2 IMPLANT
PAD ARMBOARD 7.5X6 YLW CONV (MISCELLANEOUS) ×4 IMPLANT
SCISSORS MNPLR CVD DVNC XI (INSTRUMENTS) ×2 IMPLANT
SEAL UNIV 5-12 XI (MISCELLANEOUS) ×6 IMPLANT
SET TUBE SMOKE EVAC HIGH FLOW (TUBING) ×2 IMPLANT
STOPCOCK 4 WAY LG BORE MALE ST (IV SETS) ×2 IMPLANT
SUT MNCRL AB 4-0 PS2 18 (SUTURE) ×2 IMPLANT
SUT VIC AB 3-0 SH 27X BRD (SUTURE) ×2 IMPLANT
SUT VIC AB 3-0 SH 27XBRD (SUTURE) ×2 IMPLANT
SUT VICRYL 0 UR6 27IN ABS (SUTURE) IMPLANT
SUT VLOC 180 2-0 9IN GS21 (SUTURE) IMPLANT
SYR CONTROL 10ML LL (SYRINGE) ×2 IMPLANT
TOWEL GREEN STERILE (TOWEL DISPOSABLE) ×2 IMPLANT
TRAY LAPAROSCOPIC MC (CUSTOM PROCEDURE TRAY) ×2 IMPLANT

## 2023-10-17 NOTE — Anesthesia Procedure Notes (Signed)
 Procedure Name: Intubation Date/Time: 10/17/2023 8:51 AM  Performed by: Crisoforo Burnard KIDD, CRNAPre-anesthesia Checklist: Patient identified, Emergency Drugs available, Suction available, Patient being monitored and Timeout performed Patient Re-evaluated:Patient Re-evaluated prior to induction Oxygen Delivery Method: Circle system utilized Preoxygenation: Pre-oxygenation with 100% oxygen Induction Type: IV induction Ventilation: Mask ventilation without difficulty Grade View: Grade I Tube type: Oral Tube size: 7.0 mm Number of attempts: 1 Airway Equipment and Method: Stylet and Video-laryngoscopy Placement Confirmation: ETT inserted through vocal cords under direct vision, positive ETCO2 and breath sounds checked- equal and bilateral Secured at: 22 cm Tube secured with: Tape Dental Injury: Teeth and Oropharynx as per pre-operative assessment

## 2023-10-17 NOTE — Anesthesia Postprocedure Evaluation (Signed)
 Anesthesia Post Note  Patient: [name] [name]  Procedure(s) Performed: XI ROBOTIC ASSISTED RIGHT INGUINAL HERNIA WITH MESH (Right: Abdomen) HERNIA REPAIR UMBILICAL ADULT (Abdomen)     Patient location during evaluation: PACU Anesthesia Type: General Level of consciousness: awake and alert, oriented and patient cooperative Pain management: pain level controlled Vital Signs Assessment: post-procedure vital signs reviewed and stable Respiratory status: spontaneous breathing, nonlabored ventilation and respiratory function stable Cardiovascular status: blood pressure returned to baseline and stable Postop Assessment: no apparent nausea or vomiting Anesthetic complications: no   No notable events documented.  Last Vitals:  Vitals:   10/17/23 1130 10/17/23 1145  BP: 127/83 122/70  Pulse: 76 75  Resp: 12 19  Temp:  36.6 C  SpO2: 95% 98%    Last Pain:  Vitals:   10/17/23 1145  TempSrc:   PainSc: 4                  Almarie CHRISTELLA Marchi

## 2023-10-17 NOTE — Discharge Instructions (Signed)

## 2023-10-17 NOTE — Op Note (Addendum)
 [name] [name] ([id number])  Operative Report   Date 10/17/2023  PREOPERATIVE DIAGNOSIS:  Bilateral inguinal hernia Umbilical hernia  POSTOPERATIVE DIAGNOSIS:  Right-sided inguinal hernia Umbilical hernia  PROCEDURE:  Robotic right Inguinal Hernia Repair with Mesh (Bard 3D right midweight XL polypropelene mesh) 2.  Open primary umbilical hernia repair 3. Bilateral transversus abdominus plane blocks  SURGEON: Cordella Idler, MD  ANESTHESIA: General Anesthesia    INTRAOPERATIVE FINDINGS: Fat-containing right direct inguinal hernia  IMPLANTS: Implant Name Type Inv. Item Serial No. Manufacturer Lot No. LRB No. Used Action  MESH 3DMAX MID 5X7 RT MARSHA - [id number] Mesh General MESH 3DMAX MID 5X7 RT XLRG  [employer] BARD ACCESS [id number] Right 1 Implanted    ESTIMATED BLOOD LOSS: Minimal  COMPLICATIONS: None  SPECIMENS: None  OPERATIVE INDICATIONS: Pt is a 73 y.o. male who had right sided inguinal pain as well as an umbilical bulge.  He underwent a CT scan that was suggestive of bilateral inguinal hernias.  The patient desires definitive repair.  The procedure's risks, benefits, and alternatives were explained to the patient.  Risks, including the risks of bleeding, infection, need for mesh removal, and potential for hernia recurrence, were discussed.  The patient agreed to proceed and signed informed consent in front of a witness.   DESCRIPTION OF PROCEDURE: After preoperatively identifying the patient in holding, the patient was brought to the operating room and placed supine on the operating room table.  Both arms were tucked and padded to avoid potential nerve injury.  Sequential Compression Devices were placed bilaterally.  After induction of general anesthesia, the patient was given the appropriate perioperative antibiotics.  The abdomen was prepped and draped in a typical sterile fashion.  A JACHO approved time out, where the name of the patient, the operation, and the intended  site were confirmed.  The procedure was begun.  An 8 mm periumbilical incision was made, and the umbilicus was grasped.  The abdomen was accessed with a Veress needle via the standard drop technique in the LUQ at Palmer's point.  Insufflation was established.  An 8 mm optical port was placed under direct visualization in the LLQ.  A camera was introduced into the abdomen, and a thorough inspection of the abdomen was performed to confirm there was no additional pathology.  Two additional 8 mm robotic ports were placed under direct visualization, one through the umbilical hernia and another in the RLQ.  The patient was then placed into 15-degree Trendelenburg position to facilitate examination of the bilateral inguinal spaces.  A thorough inspection of the abdomen was undertaken. There was an umbilical defect as well as a right-sided direct inguinal hernia. There was no evidence of a left-sided inguinal hernia.    The [employer] robot was brought into the field and docked.  An 8 mm 30-degree scope was placed in the mid-abdominal port.  The peritoneum was taken down 6 cm superior to the hernia from the anterior abdominal wall, and dissection was taken inferiorly towards the hernia.  Medial to the epigastric vessels, the parietal compartment was dissected and completed to visualize the rectus muscle.  This dissection was carried down to the symphysis pubis and obturator foramen.  The retropubic space was dissected to expose at least 2 cm contralateral to the midline.  Cooper's ligament was exposed and cleared at least 2 cm below the ligament to ensure adequate space for the inferior border of the mesh.  Hesselbach's triangle was cleared, assessing for a direct hernia. The direct hernia was  reduced, dissecting the contents away from the border of the transversalis fascia.  Any herniated femoral contents were reduced as well.  Lateral to the epigastric vessels, the dissection was carried out into the visceral  compartment, continuing in the true preperitoneal plane.  The cord structures were inspected and isolated. There was no evidence of an indirect hernia and only a small cord lipoma. The dissection was continued until visualization of the reflected peritoneum with the line originating 2 cm below Cooper's medially and across the psoas muscle in the lateral compartment.  Having achieved a complete dissection with a critical view of the entire myopectineal orifice, a piece of Bard 3D midweight XL right Polypropylene Mesh was introduced into the field.  It was centered at the iliopubic tract, with the medial side crossing the midline and the inferior edge positioned 2 cm below Cooper's ligament.  With complete coverage of the myopectineal orifice, the inferior edge of the peritoneum was posterior and inferior to the mesh.  The lateral aspect of the mesh extended 3-5 cm beyond the lateral edge of the psoas.  Cephalad retraction of the peritoneal flap did not cause lifting of the inferior mesh edge or cord structures.  The mesh was fixated using an interrupted 3-0 Vicryl suture placed to the ipsilateral Coopers ligament.  The peritoneal flap was closed with a running 3-0 Stratafix barbed suture.  Hemostasis was assured in the entirety of the abdomen.  I then moved toward placing a bilateral TAP block to help with post-operative pain.  Under direct visualization, the transversus abdominus plane was infiltrated with liposomal bupivocaine bilaterally.  I then turned my attention to doing a primary repair of the umbilical hernia. A suture passer was used to close the defect with an interrupted 0 vicryl using the laparoscope for visualization. The two lateral ports were removed under direct visualization.  The abdomen was desufflated under direct visualization.  The port sites were closed, local anesthetic was infiltrated, and Dermabond was applied.  After confirming twice that the sponge, needle, and instrument counts were  correct, the procedure was terminated, the patient was extubated and transferred to the recovery room in stable condition.     DISPOSITION: Stable to PACU.   Electronically Signed By: Cordella DELENA Idler 10/17/2023 10:23 AM

## 2023-10-17 NOTE — H&P (Signed)
 [name] [name] 03-Sep-1951  [id number].    HPI:  73 y/o M w/ a hz of CAD, TIA, small cell lung cancer s/p lobectomy and chemotherapy, and bilateral inguinal hernias who presents for elective repair.  He reports that he is in his usual state of health and denies any recent changes in medication. He was evaluated by anesthesia prior to surgery and deemed to have an 11% risk of a major cardiac event in the perioperative period.    ROS: Review of Systems  Constitutional: Negative.   HENT: Negative.    Eyes: Negative.   Respiratory: Negative.    Cardiovascular: Negative.   Gastrointestinal: Negative.   Genitourinary: Negative.   Musculoskeletal: Negative.   Skin: Negative.   Neurological: Negative.   Endo/Heme/Allergies: Negative.   Psychiatric/Behavioral: Negative.      Family History  Problem Relation Age of Onset   Alcohol  abuse Father    Hyperlipidemia Father    Heart attack Brother 61   Hyperlipidemia Brother    Hyperlipidemia Sister        3 sisters   Heart attack Brother    Hyperlipidemia Brother     Past Medical History:  Diagnosis Date   ADHD (attention deficit hyperactivity disorder)    Arthritis    Carpal tunnel syndrome    Right   Cataracts, bilateral    very mild   Coronary artery disease    Depression    Early cataracts, bilateral    Fatty liver    Gallstones    GERD (gastroesophageal reflux disease)    Headache 2011   HX OF VISUAL MIGRAINES - NO PAIN --MIGRAINES ATTRIBUTED TO PT CUTTING NICOTINE  PATCH IN HALF BEFORE APPLYING TO HIS SKIN.     Hepatitis 2001   C -treated and no problems since  2001 'cured'   History of hiatal hernia    patient denies this dx as of 04/13/23   History of kidney stones    found on CT Scan   HOH (hard of hearing)    wears hearing aids   lung ca dx'd 12/06/19   RIGHT Lower  LUNG   Mild cognitive impairment 12/19/2021   Muscle cramps    hands and legs   Osteopenia    Pain    KNEE OA   Pre-diabetes    diet  controlled   Right lower lobe lung mass    Shortness of breath dyspnea    SOMETIMES FEELS LIKE I CAN'T CATCH A DEEP BREATH - ON GOING FOR YEARS - STATES MEDICAL DOCTOR SENT HIM FOR CARDIAC WORK UP 2011 - PT STATES HEART OK   Stroke (HCC)    HX POSSIBLE TIA - RT LEG NUMBNESS AND TINGLING FINGERS AND NUMBNESS RT FACE - ALL RESOLVED ; STATES NEUROLOGIST COULD NOT FIND ANY EVIDENCE OF STROKE - THOUGHT PT'S EPISODE MIGHT BE FROM CUTTING NICOTINE  PATCH IN HALF AND APPLYING TO SKIN   Trigger finger    LEFT MIDDLE FINGER   Wears glasses     Past Surgical History:  Procedure Laterality Date   ANTERIOR CERVICAL DECOMP/DISCECTOMY FUSION N/A 09/26/2022   Procedure: Cervical four-five Anterior Cervical Decompression Fusion;  Surgeon: Colon Shove, MD;  Location: [facility] OR;  Service: Neurosurgery;  Laterality: N/A;   BRONCHIAL BIOPSY  02/24/2020   Procedure: BRONCHIAL BIOPSIES;  Surgeon: Shelah Lamar RAMAN, MD;  Location: [facility] ENDOSCOPY;  Service: Pulmonary;;   BRONCHIAL BRUSHINGS  02/24/2020   Procedure: BRONCHIAL BRUSHINGS;  Surgeon: Shelah Lamar RAMAN, MD;  Location: [facility]  ENDOSCOPY;  Service: Pulmonary;;   BRONCHIAL NEEDLE ASPIRATION BIOPSY  02/24/2020   Procedure: BRONCHIAL NEEDLE ASPIRATION BIOPSIES;  Surgeon: Shelah Lamar RAMAN, MD;  Location: [facility] ENDOSCOPY;  Service: Pulmonary;;   BRONCHIAL WASHINGS  02/24/2020   Procedure: BRONCHIAL WASHINGS;  Surgeon: Shelah Lamar RAMAN, MD;  Location: [facility] ENDOSCOPY;  Service: Pulmonary;;   COLONOSCOPY     CORONARY PRESSURE/FFR STUDY N/A 08/09/2022   Procedure: INTRAVASCULAR PRESSURE WIRE/FFR STUDY;  Surgeon: Wendel Lurena POUR, MD;  Location: [facility] INVASIVE CV LAB;  Service: Cardiovascular;  Laterality: N/A;   INTERCOSTAL NERVE BLOCK Right 04/28/2020   Procedure: INTERCOSTAL NERVE BLOCK;  Surgeon: Shyrl Linnie KIDD, MD;  Location: [facility] OR;  Service: Thoracic;  Laterality: Right;   left ankle surgery     fractured   LEFT HEART CATH AND CORONARY ANGIOGRAPHY N/A 06/29/2021    Procedure: LEFT HEART CATH AND CORONARY ANGIOGRAPHY;  Surgeon: Wendel Lurena POUR, MD;  Location: [facility] INVASIVE CV LAB;  Service: Cardiovascular;  Laterality: N/A;   LEFT HEART CATH AND CORONARY ANGIOGRAPHY N/A 08/09/2022   Procedure: LEFT HEART CATH AND CORONARY ANGIOGRAPHY;  Surgeon: Wendel Lurena POUR, MD;  Location: [facility] INVASIVE CV LAB;  Service: Cardiovascular;  Laterality: N/A;   left knee surgery     MENISCAL REMOVED   left shoulder     torn ligaments left shoulder   LUMBAR FUSION     L3 L4 L5   NODE DISSECTION Right 04/28/2020   Procedure: NODE DISSECTION;  Surgeon: Shyrl Linnie KIDD, MD;  Location: [facility] OR;  Service: Thoracic;  Laterality: Right;   RIGHT CARPAL TUNNEL RELEASE  2011   RIGHT KNEE ARTHROSCOPY   AUG OR SEPT 2015   AT [facility]   ROOT CANAL     ROTATOR CUFF REPAIR Left    2007   THORACOSCOPY Right 04/28/2020   XI ROBOTIC ASSISTED THORASCOPY-LOBECTOMY (Right)   TONSILLECTOMY     as child   TOTAL KNEE ARTHROPLASTY Left 07/14/2013   Procedure: LEFT TOTAL KNEE ARTHROPLASTY;  Surgeon: Donnice JONETTA Car, MD;  Location: [facility] ORS;  Service: Orthopedics;  Laterality: Left;   TOTAL KNEE ARTHROPLASTY Right 07/21/2014   Procedure: RIGHT TOTAL KNEE ARTHROPLASTY;  Surgeon: Donnice JONETTA Car, MD;  Location: [facility] ORS;  Service: Orthopedics;  Laterality: Right;   TOTAL SHOULDER ARTHROPLASTY Right 10/17/2019   Procedure: RIGHT REVERSE TOTAL SHOULDER REPLACEMENT;  Surgeon: Kay Kemps, MD;  Location: [facility] ORS;  Service: Orthopedics;  Laterality: Right;  interscalene block   TRIGGER FINGER RELEASE Left    VIDEO BRONCHOSCOPY WITH ENDOBRONCHIAL NAVIGATION Right 02/24/2020   Procedure: VIDEO BRONCHOSCOPY WITH ENDOBRONCHIAL NAVIGATION;  Surgeon: Shelah Lamar RAMAN, MD;  Location: [facility] ENDOSCOPY;  Service: Pulmonary;  Laterality: Right;   WEDGE RESECTION Right 04/28/2020   XI ROBOTIC ASSISTED THORASCOPY-RIGHT LOWER LOBE WEDGE RESECTION (Right Chest)     Social History:  reports that he quit  smoking about 11 years ago. His smoking use included cigarettes. He has never used smokeless tobacco. He reports current alcohol  use of about 7.0 standard drinks of alcohol  per week. He reports that he does not currently use drugs after having used the following drugs: Marijuana.  Allergies:  Allergies  Allergen Reactions   Statins Other (See Comments)    MYALGIAS   Chantix [Varenicline] Other (See Comments)    Ineffective; patient did not like the way it made him feel   Diclofenac Sodium Other (See Comments)   Tricor [Fenofibrate] Other (See Comments)    ineffective for cholesterol (2014)  Zetia [Ezetimibe] Other (See Comments)    ineffective for cholesterol   Rosuvastatin Calcium Other (See Comments)    Muscle pain    Medications Prior to Admission  Medication Sig Dispense Refill   ADDERALL XR 25 MG 24 hr capsule Take 25 mg by mouth daily.     clindamycin  (CLEOCIN ) 150 MG capsule Take 600 mg by mouth See admin instructions. Take 4 capsules (600 mg) by mouth prior to dental appointments     cyclobenzaprine  (FLEXERIL ) 5 MG tablet Take 1 tablet (5 mg total) by mouth 3 (three) times daily as needed for muscle spasms. 30 tablet 0   Evolocumab  (REPATHA  SURECLICK) 140 MG/ML SOAJ Inject 140 mg into the skin every 14 (fourteen) days. 6 mL 3   ibuprofen  (ADVIL ) 200 MG tablet Take 600 mg by mouth every 6 (six) hours as needed for moderate pain (pain score 4-6).     sertraline  (ZOLOFT ) 100 MG tablet Take 100 mg by mouth in the morning.     tamsulosin  (FLOMAX ) 0.4 MG CAPS capsule Take 0.4 mg by mouth in the morning.     aspirin  EC 81 MG tablet Take 81 mg by mouth daily. Swallow whole.     lidocaine -prilocaine  (EMLA ) cream Apply 1 Application topically as needed. (Patient not taking: Reported on 10/09/2023) 30 g 0   oxyCODONE -acetaminophen  (PERCOCET) 5-325 MG tablet Take 1 tablet by mouth every 4 (four) hours as needed for severe pain. (Patient not taking: Reported on 08/23/2023) 20 tablet 0     Physical Exam: Blood pressure 133/86, pulse 70, temperature 97.9 F (36.6 C), temperature source Oral, resp. rate 18, height 6' 1 (1.854 m), weight 88.5 kg, SpO2 94%. Gen: male, NAD Abd: soft, non-distended, non-tender  Results for orders placed or performed in visit on 10/15/23 (from the past 48 hours)  CMP (Cancer Center only)     Status: Abnormal   Collection Time: 10/15/23  2:11 PM  Result Value Ref Range   Sodium 138 135 - 145 mmol/L   Potassium 4.8 3.5 - 5.1 mmol/L   Chloride 104 98 - 111 mmol/L   CO2 30 22 - 32 mmol/L   Glucose, Bld 100 (H) 70 - 99 mg/dL    Comment: Glucose reference range applies only to samples taken after fasting for at least 8 hours.   BUN 25 (H) 8 - 23 mg/dL   Creatinine 8.88 9.38 - 1.24 mg/dL   Calcium 9.4 8.9 - 89.6 mg/dL   Total Protein 6.9 6.5 - 8.1 g/dL   Albumin  4.4 3.5 - 5.0 g/dL   AST 18 15 - 41 U/L   ALT 15 0 - 44 U/L   Alkaline Phosphatase 106 38 - 126 U/L   Total Bilirubin 0.5 0.0 - 1.2 mg/dL   GFR, Estimated >39 >39 mL/min    Comment: (NOTE) Calculated using the CKD-EPI Creatinine Equation (2021)    Anion gap 4 (L) 5 - 15    Comment: Performed at [facility], [address] [place], [place] [address]  CBC with Differential (Cancer Center Only)     Status: None   Collection Time: 10/15/23  2:11 PM  Result Value Ref Range   WBC Count 7.8 4.0 - 10.5 K/uL   RBC 5.07 4.22 - 5.81 MIL/uL   Hemoglobin 15.1 13.0 - 17.0 g/dL   HCT 54.0 60.9 - 47.9 %   MCV 90.5 80.0 - 100.0 fL   MCH 29.8 26.0 - 34.0 pg   MCHC 32.9 30.0 -  36.0 g/dL   RDW 86.7 88.4 - 84.4 %   Platelet Count 174 150 - 400 K/uL   nRBC 0.0 0.0 - 0.2 %   Neutrophils Relative % 79 %   Neutro Abs 6.1 1.7 - 7.7 K/uL   Lymphocytes Relative 13 %   Lymphs Abs 1.0 0.7 - 4.0 K/uL   Monocytes Relative 7 %   Monocytes Absolute 0.6 0.1 - 1.0 K/uL   Eosinophils Relative 1 %   Eosinophils Absolute 0.1 0.0 - 0.5 K/uL   Basophils Relative 0 %   Basophils  Absolute 0.0 0.0 - 0.1 K/uL   Immature Granulocytes 0 %   Abs Immature Granulocytes 0.02 0.00 - 0.07 K/uL    Comment: Performed at [facility], [address] [place], [place] [address]   No results found.  Assessment/Plan 72 y/o M w/ a hx of CAD, TIA, and lung cancer s/p lobectomy who presents for elective bilateral inguinal hernia repairs.  - Will proceed to the OR. We discussed the alternatives and potential risks of surgery, including but not limited to: bleeding, infection, damage to bowel or surrounding structures, chronic pain, mesh complications, cardiac complications, recurrent hernia, and need for additional procedure. All questions were addressed and consent was obtained.    Cordella DELENA Polly Marlis Cheron Surgery 10/17/2023, 7:05 AM Please see Amion for pager number during day hours 7:00am-4:30pm or 7:00am -11:30am on weekends

## 2023-10-17 NOTE — Transfer of Care (Signed)
 Immediate Anesthesia Transfer of Care Note  Patient: [name] [name]  Procedure(s) Performed: XI ROBOTIC ASSISTED RIGHT INGUINAL HERNIA WITH MESH (Right: Abdomen) HERNIA REPAIR UMBILICAL ADULT (Abdomen)  Patient Location: PACU  Anesthesia Type:General  Level of Consciousness: awake, alert , and oriented  Airway & Oxygen Therapy: Patient Spontanous Breathing  Post-op Assessment: Report given to RN  Post vital signs: Reviewed and stable  Last Vitals:  Vitals Value Taken Time  BP 135/80 10/17/23 1020  Temp 36.7 C 10/17/23 1020  Pulse 73 10/17/23 1024  Resp 17 10/17/23 1024  SpO2 99 % 10/17/23 1024  Vitals shown include unfiled device data.  Last Pain:  Vitals:   10/17/23 0708  TempSrc:   PainSc: 4       Patients Stated Pain Goal: 3 (10/17/23 0708)  Complications: No notable events documented.

## 2023-10-18 ENCOUNTER — Encounter (HOSPITAL_COMMUNITY): Payer: Self-pay | Admitting: General Surgery

## 2023-10-24 ENCOUNTER — Inpatient Hospital Stay (HOSPITAL_BASED_OUTPATIENT_CLINIC_OR_DEPARTMENT_OTHER): Payer: Medicare Other | Admitting: Internal Medicine

## 2023-10-24 VITALS — BP 120/76 | HR 80 | Temp 98.2°F | Resp 18 | Ht 73.0 in | Wt 196.6 lb

## 2023-10-24 DIAGNOSIS — C3490 Malignant neoplasm of unspecified part of unspecified bronchus or lung: Secondary | ICD-10-CM | POA: Diagnosis not present

## 2023-10-24 NOTE — Progress Notes (Signed)
[facility] [phone]   [phone]  OFFICE PROGRESS NOTE  Debroah Loop, DO 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   PRIOR THERAPY:   1) Status post right lower lobectomy with lymph node dissection under the care of Dr. Cliffton Asters on 04/28/2020. 2)  Adjuvant systemic chemotherapy with cisplatin 75 mg/m2 and Alimta 500 mg/mm IV every 3 weeks.  First dose expected on 06/16/2020.  Status post 4 cycles.   CURRENT THERAPY:  Observation.  INTERVAL HISTORY: [name] [name] 73 y.o. male returns to the clinic today for 50-month follow-up visit.Discussed the use of AI scribe software for clinical note transcription with the patient, who gave verbal consent to proceed.  History of Present Illness   [name] [name] is a 73 year old male with lung cancer who presents for follow-up after recent hernia surgery and ongoing cancer surveillance.  He underwent a lobectomy for lung cancer in August 2021, followed by four cycles of chemotherapy with cisplatin and Alimta. He has been under surveillance since completing chemotherapy and has no new issues related to cancer. A recent chest scan showed a stable nodule in the right lung, similar to previous scans, and a PET scan was negative for new lesions.  He is scheduled for an MRI to evaluate the L3-4-5 area due to previous bone lesions that were biopsied and found negative. He experiences some pain in the right hip, which is managed with injections that provide relief for about three months. Despite the negative biopsy results, he continues to have some pain in the right hip.  He recently underwent hernia surgery on October 17, 2023, and has soreness and robotic punctures at the surgical site, but states that he is 'fine'.  He is not currently  taking Tylenol, ibuprofen, or Imlicrim.         MEDICAL HISTORY: Past Medical History:  Diagnosis Date   ADHD (attention deficit hyperactivity disorder)    Arthritis    Carpal tunnel syndrome    Right   Cataracts, bilateral    very mild   Coronary artery disease    Depression    Early cataracts, bilateral    Fatty liver    Gallstones    GERD (gastroesophageal reflux disease)    Headache 2011   HX OF VISUAL MIGRAINES - NO PAIN --MIGRAINES ATTRIBUTED TO PT CUTTING NICOTINE PATCH IN HALF BEFORE APPLYING TO HIS SKIN.     Hepatitis 2001   C -treated and no problems since  2001 'cured'   History of hiatal hernia    patient denies this dx as of 04/13/23   History of kidney stones    found on CT Scan   HOH (hard of hearing)    wears hearing aids   lung ca dx'd 12/06/19   RIGHT Lower  LUNG   Mild cognitive impairment 12/19/2021   Muscle cramps    hands and legs   Osteopenia    Pain    KNEE OA   Pre-diabetes    diet controlled   Right lower lobe lung mass    Shortness of breath dyspnea    "SOMETIMES FEELS LIKE I CAN'T CATCH A DEEP BREATH "- ON GOING FOR YEARS - STATES MEDICAL DOCTOR SENT HIM FOR CARDIAC WORK UP 2011 - PT STATES HEART OK   Stroke [facility])  HX POSSIBLE TIA - RT LEG NUMBNESS AND TINGLING FINGERS AND NUMBNESS RT FACE - ALL RESOLVED ; STATES NEUROLOGIST COULD NOT FIND ANY EVIDENCE OF STROKE - THOUGHT PT'S EPISODE MIGHT BE FROM CUTTING NICOTINE PATCH IN HALF AND APPLYING TO SKIN   Trigger finger    LEFT MIDDLE FINGER   Wears glasses     ALLERGIES:  is allergic to statins, chantix [varenicline], diclofenac sodium, tricor [fenofibrate], zetia [ezetimibe], and rosuvastatin calcium.  MEDICATIONS:  Current Outpatient Medications  Medication Sig Dispense Refill   ADDERALL XR 25 MG 24 hr capsule Take 25 mg by mouth daily.     aspirin EC 81 MG tablet Take 81 mg by mouth daily. Swallow whole.     clindamycin (CLEOCIN) 150 MG capsule Take 600 mg by mouth See admin  instructions. Take 4 capsules (600 mg) by mouth prior to dental appointments     cyclobenzaprine (FLEXERIL) 5 MG tablet Take 1 tablet (5 mg total) by mouth 3 (three) times daily as needed for muscle spasms. 30 tablet 0   Evolocumab (REPATHA SURECLICK) 140 MG/ML SOAJ Inject 140 mg into the skin every 14 (fourteen) days. 6 mL 3   ibuprofen (ADVIL) 200 MG tablet Take 600 mg by mouth every 6 (six) hours as needed for moderate pain (pain score 4-6).     lidocaine-prilocaine (EMLA) cream Apply 1 Application topically as needed. (Patient not taking: Reported on 10/09/2023) 30 g 0   oxyCODONE-acetaminophen (PERCOCET) 5-325 MG tablet Take 1 tablet by mouth every 4 (four) hours as needed for severe pain. (Patient not taking: Reported on 08/23/2023) 20 tablet 0   sertraline (ZOLOFT) 100 MG tablet Take 100 mg by mouth in the morning.     tamsulosin (FLOMAX) 0.4 MG CAPS capsule Take 0.4 mg by mouth in the morning.     No current facility-administered medications for this visit.    SURGICAL HISTORY:  Past Surgical History:  Procedure Laterality Date   ANTERIOR CERVICAL DECOMP/DISCECTOMY FUSION N/A 09/26/2022   Procedure: Cervical four-five Anterior Cervical Decompression Fusion;  Surgeon: Barnett Abu, MD;  Location: [facility] OR;  Service: Neurosurgery;  Laterality: N/A;   BRONCHIAL BIOPSY  02/24/2020   Procedure: BRONCHIAL BIOPSIES;  Surgeon: Leslye Peer, MD;  Location: [facility] ENDOSCOPY;  Service: Pulmonary;;   BRONCHIAL BRUSHINGS  02/24/2020   Procedure: BRONCHIAL BRUSHINGS;  Surgeon: Leslye Peer, MD;  Location: [facility] ENDOSCOPY;  Service: Pulmonary;;   BRONCHIAL NEEDLE ASPIRATION BIOPSY  02/24/2020   Procedure: BRONCHIAL NEEDLE ASPIRATION BIOPSIES;  Surgeon: Leslye Peer, MD;  Location: [facility] ENDOSCOPY;  Service: Pulmonary;;   BRONCHIAL WASHINGS  02/24/2020   Procedure: BRONCHIAL WASHINGS;  Surgeon: Leslye Peer, MD;  Location: [facility] ENDOSCOPY;  Service: Pulmonary;;   COLONOSCOPY     CORONARY PRESSURE/FFR  STUDY N/A 08/09/2022   Procedure: INTRAVASCULAR PRESSURE WIRE/FFR STUDY;  Surgeon: Orbie Pyo, MD;  Location: [facility] INVASIVE CV LAB;  Service: Cardiovascular;  Laterality: N/A;   INTERCOSTAL NERVE BLOCK Right 04/28/2020   Procedure: INTERCOSTAL NERVE BLOCK;  Surgeon: Corliss Skains, MD;  Location: [facility] OR;  Service: Thoracic;  Laterality: Right;   left ankle surgery     fractured   LEFT HEART CATH AND CORONARY ANGIOGRAPHY N/A 06/29/2021   Procedure: LEFT HEART CATH AND CORONARY ANGIOGRAPHY;  Surgeon: Orbie Pyo, MD;  Location: [facility] INVASIVE CV LAB;  Service: Cardiovascular;  Laterality: N/A;   LEFT HEART CATH AND CORONARY ANGIOGRAPHY N/A 08/09/2022   Procedure: LEFT HEART CATH AND CORONARY ANGIOGRAPHY;  Surgeon:  Orbie Pyo, MD;  Location: [facility] INVASIVE CV LAB;  Service: Cardiovascular;  Laterality: N/A;   left knee surgery     MENISCAL REMOVED   left shoulder     torn ligaments left shoulder   LUMBAR FUSION     L3 L4 L5   NODE DISSECTION Right 04/28/2020   Procedure: NODE DISSECTION;  Surgeon: Corliss Skains, MD;  Location: [facility] OR;  Service: Thoracic;  Laterality: Right;   RIGHT CARPAL TUNNEL RELEASE  2011   RIGHT KNEE ARTHROSCOPY   AUG OR SEPT 2015   AT [facility]   ROOT CANAL     ROTATOR CUFF REPAIR Left    2007   THORACOSCOPY Right 04/28/2020   XI ROBOTIC ASSISTED THORASCOPY-LOBECTOMY (Right)   TONSILLECTOMY     as child   TOTAL KNEE ARTHROPLASTY Left 07/14/2013   Procedure: LEFT TOTAL KNEE ARTHROPLASTY;  Surgeon: Shelda Pal, MD;  Location: [facility] ORS;  Service: Orthopedics;  Laterality: Left;   TOTAL KNEE ARTHROPLASTY Right 07/21/2014   Procedure: RIGHT TOTAL KNEE ARTHROPLASTY;  Surgeon: Shelda Pal, MD;  Location: [facility] ORS;  Service: Orthopedics;  Laterality: Right;   TOTAL SHOULDER ARTHROPLASTY Right 10/17/2019   Procedure: RIGHT REVERSE TOTAL SHOULDER REPLACEMENT;  Surgeon: Beverely Low, MD;  Location: [facility] ORS;  Service: Orthopedics;   Laterality: Right;  interscalene block   TRIGGER FINGER RELEASE Left    UMBILICAL HERNIA REPAIR N/A 10/17/2023   Procedure: HERNIA REPAIR UMBILICAL ADULT;  Surgeon: Moise Boring, MD;  Location: [facility] OR;  Service: General;  Laterality: N/A;  GEN AND TAP BLOCK   VIDEO BRONCHOSCOPY WITH ENDOBRONCHIAL NAVIGATION Right 02/24/2020   Procedure: VIDEO BRONCHOSCOPY WITH ENDOBRONCHIAL NAVIGATION;  Surgeon: Leslye Peer, MD;  Location: [facility] ENDOSCOPY;  Service: Pulmonary;  Laterality: Right;   WEDGE RESECTION Right 04/28/2020   XI ROBOTIC ASSISTED THORASCOPY-RIGHT LOWER LOBE WEDGE RESECTION (Right Chest)     REVIEW OF SYSTEMS:  A comprehensive review of systems was negative except for: Musculoskeletal: positive for back pain   PHYSICAL EXAMINATION: General appearance: alert, cooperative, and no distress Head: Normocephalic, without obvious abnormality, atraumatic Neck: no adenopathy, no JVD, supple, symmetrical, trachea midline, and thyroid not enlarged, symmetric, no tenderness/mass/nodules Lymph nodes: Cervical, supraclavicular, and axillary nodes normal. Resp: clear to auscultation bilaterally Back: symmetric, no curvature. ROM normal. No CVA tenderness. Cardio: regular rate and rhythm, S1, S2 normal, no murmur, click, rub or gallop GI: soft, non-tender; bowel sounds normal; no masses,  no organomegaly Extremities: extremities normal, atraumatic, no cyanosis or edema  ECOG PERFORMANCE STATUS: 1 - Symptomatic but completely ambulatory   Blood pressure 120/76, pulse 80, temperature 98.2 F (36.8 C), temperature source Tympanic, resp. rate 18, height 6\' 1"  (1.854 m), weight 196 lb 9.6 oz (89.2 kg), SpO2 100%.  LABORATORY DATA: Lab Results  Component Value Date   WBC 7.8 10/15/2023   HGB 15.1 10/15/2023   HCT 45.9 10/15/2023   MCV 90.5 10/15/2023   PLT 174 10/15/2023      Chemistry      Component Value Date/Time   NA 138 10/15/2023 1411   NA 139 11/10/2022 1259   K 4.8 10/15/2023  1411   CL 104 10/15/2023 1411   CO2 30 10/15/2023 1411   BUN 25 (H) 10/15/2023 1411   BUN 22 11/10/2022 1259   CREATININE 1.11 10/15/2023 1411      Component Value Date/Time   CALCIUM 9.4 10/15/2023 1411   ALKPHOS 106 10/15/2023 1411   AST 18  10/15/2023 1411   ALT 15 10/15/2023 1411   BILITOT 0.5 10/15/2023 1411       RADIOGRAPHIC STUDIES: No results found.   ASSESSMENT AND PLAN: This is a very pleasant 73 years old white male with a stage IIIa non-small cell lung cancer, mucinous adenocarcinoma status post right lower lobectomy with lymph node dissection in August 2021. The patient underwent adjuvant systemic chemotherapy with cisplatin and Alimta status post 4 cycles. The patient tolerated the previous 4 cycles of his treatment well with no concerning adverse effect except for fatigue. The patient has been on observation since that time and he is feeling fine with no concerning complaints except for the recent low back pain.  He was found to have a suspicious lesion at the L3 by his neurosurgeon. The patient had CT of the lumbar spine performed earlier today.  I personally and independently reviewed the imaging studies with the patient and his wife.  The scan showed bubbly lytic lesion at L3 and L5 bodies centered on the left L3 and bilateral L5 pedicle screws and this pattern are suggestive of reactive process to the pedicle screws but of course with his history of lung cancer metastatic disease or multiple myeloma could not be completely excluded. A PET scan on April 01, 2023 showed no concerning finding for disease recurrence but there was hypermetabolic activity in the L3-5 concerning for metastatic disease.  The patient underwent biopsy under the care of Dr. Danielle Dess and it was negative for malignancy. The patient had repeat CT scan of the chest performed recently.  Unfortunately the final report is still pending but I personally and independently reviewed the images and I did not see  any clear evidence for progression but I will wait for the final report for confirmation.    Lung Cancer (Post-Lobectomy) 73 year old male with lung cancer, status post-lobectomy, and four cycles of chemotherapy (cisplatin and pemetrexed) completed in August 2021. Follow-up PET scan and biopsy of bone lesions were negative. Recent chest scan shows a nodule in the right lung with no new concerning findings. Awaiting final radiology report. - Await final radiology report - Follow-up in six months with lab and scan  Hip Pain Persistent right hip pain managed with periodic injections providing relief for three months. Previous biopsy of bone lesions was negative. - Continue periodic injections as needed  Post-Hernia Surgery Underwent hernia surgery seven days ago. Reports soreness and robotic punctures but overall doing fine. - Monitor recovery  General Health Maintenance Reviewed current medications. Not taking Tylenol, ibuprofen, or Imlicrim. - Update medication list to remove Tylenol, ibuprofen, and Imlicrim  Follow-up - Follow-up in six months with lab and scan - Call if any concerning findings in the final radiology report.   The patient was advised to call immediately if he has any other concerning symptoms in the interval. The patient voices understanding of current disease status and treatment options and is in agreement with the current care plan.  All questions were answered. The patient knows to call the clinic with any problems, questions or concerns. We can certainly see the patient much sooner if necessary.  Disclaimer: This note was dictated with voice recognition software. Similar sounding words can inadvertently be transcribed and may not be corrected upon review.

## 2023-10-24 NOTE — Addendum Note (Signed)
Addended by: Terald Sleeper B on: 10/24/2023 12:12 PM   Modules accepted: Orders

## 2023-12-10 ENCOUNTER — Other Ambulatory Visit: Payer: Self-pay | Admitting: Internal Medicine

## 2023-12-26 ENCOUNTER — Encounter: Payer: Self-pay | Admitting: Neurology

## 2023-12-26 ENCOUNTER — Ambulatory Visit (INDEPENDENT_AMBULATORY_CARE_PROVIDER_SITE_OTHER): Payer: Medicare Other | Admitting: Neurology

## 2023-12-26 VITALS — BP 126/74 | HR 90 | Ht 73.0 in | Wt 196.5 lb

## 2023-12-26 DIAGNOSIS — F909 Attention-deficit hyperactivity disorder, unspecified type: Secondary | ICD-10-CM | POA: Diagnosis not present

## 2023-12-26 DIAGNOSIS — R419 Unspecified symptoms and signs involving cognitive functions and awareness: Secondary | ICD-10-CM

## 2023-12-26 NOTE — Patient Instructions (Signed)
 Continue current medication Continue follow-up PCP and psychiatry Return as needed.

## 2023-12-26 NOTE — Progress Notes (Signed)
 [facility]  PATIENT: [name] [name] DOB: 09-18-1950  REQUESTING CLINICIAN: Blair Heys, MD HISTORY FROM: Patient  REASON FOR VISIT: Memory deficits    HISTORICAL  CHIEF COMPLAINT:  Chief Complaint  Patient presents with   Memory Loss    Rm12, alone, Memory loss: moca score of 30, stated recently diagnosed w/adhd and also has dyslexia and believes the combination affects cognitive function    INTERVAL HISTORY 12/26/2023:  Patient presents today for for follow-up, last visit was a year ago.  At that time we referred him for formal neuropsychological testing which was normal, no deficit noted.  His Aricept was discontinued and patient was recommended evaluation for ADHD.  He did follow up with psychiatry and was diagnosed with ADHD and started on medication.  He tells me since being on medication he feels like his focus is better, but still having trouble with executive function.     INTERVAL HISTORY 12/20/2022:  Patient presents today for follow-up, last visit was a year ago.  Since then he has been doing well.  He reports taking the Aricept 10 mg nightly, denies any side effect from the medicine.  He still reports difficulty with memory, has to write things down, using a calendar or using his phone to remember.  He still independent but wife has also complained about his memory.  [place].  Currently he is going under a lot of stress because he is being worked up for recurrence of His cancer. He is interested in having additional testing such as neuropsychological testing or being screened for ADD.  HISTORY OF PRESENT ILLNESS:  This is a 73 year old gentleman with past medical history of chronic pain, depression, history of lung cancer, who is presenting with memory problem.  Patient describes memory problem as he cannot remember dates, having trouble remembering the year of his  marriage.  He also reports that he is easily distracted, he will get up to go to a different room to pick up something but once he reaches the room, he will forget why he came here for the first. At time, he will forget what he is supposed to do.  This has been going on for the past couple years but worse in the past year.  He said that wife has complained about his memory.  He does have a family history of Alzheimer disease, aunt was diagnosed in her 53s and died in her 103s.  He is still independent, able to perform all activity of daily living.  Patient used to work for [employer], he retired in 2020.  Prior to that he did a very detailed work, daily for 25 years.  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     TBI: No past history of TBI Stroke:   no past history of stroke Seizures:   no past history of seizures Sleep:   no history of sleep apnea.   Mood:  patient denies anxiety and depression  Functional status: independent in all ADLs and IADLs Patient lives with wife. Cooking: wife, will try to cook with reading instructions  Cleaning: patient  Shopping: will make a list if more than 3 things  Bathing: patient  Toileting: patient  Driving: Yes, no recent accident  Bills: No  Medications: Gabapentin, Flexeril, Opioids  Ever left the stove on by accident?: yes Forget how to use items around the house?: No  Getting lost going to familiar places?: No  Forgetting loved ones names?: No  Word finding difficulty? Yes  Sleep: Pretty good, will get about 8-10   OTHER MEDICAL CONDITIONS: Chronic pain, Depression, History of lung cancer (RLL removed, 4 sessions of chemo)    REVIEW OF SYSTEMS: Full 14 system review of systems performed and negative with exception of: as noted in the HPI   ALLERGIES: Allergies  Allergen Reactions   Statins Other (See Comments)    MYALGIAS   Chantix [Varenicline] Other (See Comments)    Ineffective; patient did not like the way it made him  feel   Diclofenac Sodium Other (See Comments)   Tricor [Fenofibrate] Other (See Comments)    ineffective for cholesterol (2014)   Zetia [Ezetimibe] Other (See Comments)    ineffective for cholesterol   Rosuvastatin Calcium Other (See Comments)    Muscle pain    HOME MEDICATIONS: Outpatient Medications Prior to Visit  Medication Sig Dispense Refill   amphetamine-dextroamphetamine (ADDERALL XR) 20 MG 24 hr capsule Take 40 mg by mouth daily.     aspirin EC 81 MG tablet Take 81 mg by mouth daily. Swallow whole.     clindamycin (CLEOCIN) 150 MG capsule Take 600 mg by mouth See admin instructions. Take 4 capsules (600 mg) by mouth prior to dental appointments     cyclobenzaprine (FLEXERIL) 5 MG tablet Take 1 tablet (5 mg total) by mouth 3 (three) times daily as needed for muscle spasms. 30 tablet 0   Evolocumab (REPATHA SURECLICK) 140 MG/ML SOAJ INJECT 140 MG INTO THE SKIN EVERY 14 (FOURTEEN) DAYS. 6 mL 1   magnesium oxide (MAG-OX) 400 (240 Mg) MG tablet Take 400 mg by mouth daily.     sertraline (ZOLOFT) 100 MG tablet Take 100 mg by mouth in the morning.     tamsulosin (FLOMAX) 0.4 MG CAPS capsule Take 0.4 mg by mouth in the morning.     ADDERALL XR 25 MG 24 hr capsule Take 25 mg by mouth daily.     lidocaine-prilocaine (EMLA) cream Apply 1 Application topically as needed. (Patient not taking: Reported on 10/09/2023) 30 g 0   oxyCODONE-acetaminophen (PERCOCET) 5-325 MG tablet Take 1 tablet by mouth every 4 (four) hours as needed for severe pain. (Patient not taking: Reported on 08/23/2023) 20 tablet 0   No facility-administered medications prior to visit.    PAST MEDICAL HISTORY: Past Medical History:  Diagnosis Date   ADHD (attention deficit hyperactivity disorder)    Arthritis    Carpal tunnel syndrome    Right   Cataracts, bilateral    very mild   Coronary artery disease    Depression    Early cataracts, bilateral    Fatty liver    Gallstones    GERD (gastroesophageal reflux  disease)    Headache 2011   HX OF VISUAL MIGRAINES - NO PAIN --MIGRAINES ATTRIBUTED TO PT CUTTING NICOTINE PATCH IN HALF BEFORE APPLYING TO HIS SKIN.     Hepatitis 2001   C -treated and no problems since  2001 'cured'   History of hiatal hernia    patient denies this dx as of 04/13/23   History of kidney stones    found on CT Scan   HOH (hard of hearing)    wears hearing aids   lung ca dx'd 12/06/19   RIGHT Lower  LUNG   Mild cognitive impairment 12/19/2021   Muscle cramps    hands and legs   Osteopenia  Pain    KNEE OA   Pre-diabetes    diet controlled   Right lower lobe lung mass    Shortness of breath dyspnea    "SOMETIMES FEELS LIKE I CAN'T CATCH A DEEP BREATH "- ON GOING FOR YEARS - STATES MEDICAL DOCTOR SENT HIM FOR CARDIAC WORK UP 2011 - PT STATES HEART OK   Stroke (HCC)    HX POSSIBLE TIA - RT LEG NUMBNESS AND TINGLING FINGERS AND NUMBNESS RT FACE - ALL RESOLVED ; STATES NEUROLOGIST COULD NOT FIND ANY EVIDENCE OF STROKE - THOUGHT PT'S EPISODE MIGHT BE FROM CUTTING NICOTINE PATCH IN HALF AND APPLYING TO SKIN   Trigger finger    LEFT MIDDLE FINGER   Wears glasses     PAST SURGICAL HISTORY: Past Surgical History:  Procedure Laterality Date   ANTERIOR CERVICAL DECOMP/DISCECTOMY FUSION N/A 09/26/2022   Procedure: Cervical four-five Anterior Cervical Decompression Fusion;  Surgeon: Barnett Abu, MD;  Location: [facility] OR;  Service: Neurosurgery;  Laterality: N/A;   BRONCHIAL BIOPSY  02/24/2020   Procedure: BRONCHIAL BIOPSIES;  Surgeon: Leslye Peer, MD;  Location: [facility] ENDOSCOPY;  Service: Pulmonary;;   BRONCHIAL BRUSHINGS  02/24/2020   Procedure: BRONCHIAL BRUSHINGS;  Surgeon: Leslye Peer, MD;  Location: [facility] ENDOSCOPY;  Service: Pulmonary;;   BRONCHIAL NEEDLE ASPIRATION BIOPSY  02/24/2020   Procedure: BRONCHIAL NEEDLE ASPIRATION BIOPSIES;  Surgeon: Leslye Peer, MD;  Location: [facility] ENDOSCOPY;  Service: Pulmonary;;   BRONCHIAL WASHINGS  02/24/2020   Procedure: BRONCHIAL  WASHINGS;  Surgeon: Leslye Peer, MD;  Location: [facility] ENDOSCOPY;  Service: Pulmonary;;   COLONOSCOPY     CORONARY PRESSURE/FFR STUDY N/A 08/09/2022   Procedure: INTRAVASCULAR PRESSURE WIRE/FFR STUDY;  Surgeon: Orbie Pyo, MD;  Location: [facility] INVASIVE CV LAB;  Service: Cardiovascular;  Laterality: N/A;   INTERCOSTAL NERVE BLOCK Right 04/28/2020   Procedure: INTERCOSTAL NERVE BLOCK;  Surgeon: Corliss Skains, MD;  Location: [facility] OR;  Service: Thoracic;  Laterality: Right;   left ankle surgery     fractured   LEFT HEART CATH AND CORONARY ANGIOGRAPHY N/A 06/29/2021   Procedure: LEFT HEART CATH AND CORONARY ANGIOGRAPHY;  Surgeon: Orbie Pyo, MD;  Location: [facility] INVASIVE CV LAB;  Service: Cardiovascular;  Laterality: N/A;   LEFT HEART CATH AND CORONARY ANGIOGRAPHY N/A 08/09/2022   Procedure: LEFT HEART CATH AND CORONARY ANGIOGRAPHY;  Surgeon: Orbie Pyo, MD;  Location: [facility] INVASIVE CV LAB;  Service: Cardiovascular;  Laterality: N/A;   left knee surgery     MENISCAL REMOVED   left shoulder     torn ligaments left shoulder   LUMBAR FUSION     L3 L4 L5   NODE DISSECTION Right 04/28/2020   Procedure: NODE DISSECTION;  Surgeon: Corliss Skains, MD;  Location: [facility] OR;  Service: Thoracic;  Laterality: Right;   RIGHT CARPAL TUNNEL RELEASE  2011   RIGHT KNEE ARTHROSCOPY   AUG OR SEPT 2015   AT [facility]   ROOT CANAL     ROTATOR CUFF REPAIR Left    2007   THORACOSCOPY Right 04/28/2020   XI ROBOTIC ASSISTED THORASCOPY-LOBECTOMY (Right)   TONSILLECTOMY     as child   TOTAL KNEE ARTHROPLASTY Left 07/14/2013   Procedure: LEFT TOTAL KNEE ARTHROPLASTY;  Surgeon: Shelda Pal, MD;  Location: [facility] ORS;  Service: Orthopedics;  Laterality: Left;   TOTAL KNEE ARTHROPLASTY Right 07/21/2014   Procedure: RIGHT TOTAL KNEE ARTHROPLASTY;  Surgeon: Shelda Pal, MD;  Location: [facility] ORS;  Service: Orthopedics;  Laterality: Right;   TOTAL SHOULDER ARTHROPLASTY Right 10/17/2019    Procedure: RIGHT REVERSE TOTAL SHOULDER REPLACEMENT;  Surgeon: Beverely Low, MD;  Location: [facility] ORS;  Service: Orthopedics;  Laterality: Right;  interscalene block   TRIGGER FINGER RELEASE Left    UMBILICAL HERNIA REPAIR N/A 10/17/2023   Procedure: HERNIA REPAIR UMBILICAL ADULT;  Surgeon: Moise Boring, MD;  Location: [facility] OR;  Service: General;  Laterality: N/A;  GEN AND TAP BLOCK   VIDEO BRONCHOSCOPY WITH ENDOBRONCHIAL NAVIGATION Right 02/24/2020   Procedure: VIDEO BRONCHOSCOPY WITH ENDOBRONCHIAL NAVIGATION;  Surgeon: Leslye Peer, MD;  Location: [facility] ENDOSCOPY;  Service: Pulmonary;  Laterality: Right;   WEDGE RESECTION Right 04/28/2020   XI ROBOTIC ASSISTED THORASCOPY-RIGHT LOWER LOBE WEDGE RESECTION (Right Chest)     FAMILY HISTORY: Family History  Problem Relation Age of Onset   Alcohol abuse Father    Hyperlipidemia Father    Heart attack Brother 40   Hyperlipidemia Brother    Hyperlipidemia Sister        3 sisters   Heart attack Brother    Hyperlipidemia Brother     SOCIAL HISTORY: Social History   Socioeconomic History   Marital status: Married    Spouse name: Not on file   Number of children: Not on file   Years of education: Not on file   Highest education level: Not on file  Occupational History   Not on file  Tobacco Use   Smoking status: Former    Current packs/day: 0.00    Types: Cigarettes    Quit date: 04/30/2012    Years since quitting: 11.6   Smokeless tobacco: Never  Vaping Use   Vaping status: Former  Substance and Sexual Activity   Alcohol use: Yes    Alcohol/week: 7.0 standard drinks of alcohol    Types: 7 Shots of liquor per week    Comment: Now drinks 2 beers per week   Drug use: Not Currently    Types: Marijuana    Comment: ocassionally, last time smoked 07/2017   Sexual activity: Not Currently  Other Topics Concern   Not on file  Social History Narrative   Not on file   Social Drivers of Health   Financial Resource Strain: Not on  file  Food Insecurity: No Food Insecurity (04/06/2023)   Hunger Vital Sign    Worried About Running Out of Food in the Last Year: Never true    Ran Out of Food in the Last Year: Never true  Transportation Needs: No Transportation Needs (04/06/2023)   PRAPARE - Administrator, Civil Service (Medical): No    Lack of Transportation (Non-Medical): No  Physical Activity: Not on file  Stress: Not on file  Social Connections: Not on file  Intimate Partner Violence: Not At Risk (04/06/2023)   Humiliation, Afraid, Rape, and Kick questionnaire    Fear of Current or Ex-Partner: No    Emotionally Abused: No    Physically Abused: No    Sexually Abused: No    PHYSICAL EXAM  GENERAL EXAM/CONSTITUTIONAL: Vitals:  Vitals:   12/26/23 1444  BP: 126/74  Pulse: 90  Weight: 196 lb 8 oz (89.1 kg)  Height: 6\' 1"  (1.854 m)   Body mass index is 25.93 kg/m. Wt Readings from Last 3 Encounters:  12/26/23 196 lb 8 oz (89.1 kg)  10/24/23 196 lb 9.6 oz (89.2 kg)  10/17/23 195 lb (88.5 kg)   Patient is in no distress; well developed, nourished  and groomed; neck is supple  MUSCULOSKELETAL: Gait, strength, tone, movements noted in Neurologic exam below  NEUROLOGIC: MENTAL STATUS:      No data to display         awake, alert, oriented to person, place and time recent and remote memory intact normal attention and concentration language fluent, comprehension intact, naming intact fund of knowledge appropriate    12/26/2023    2:47 PM 12/20/2022    1:07 PM 12/19/2021    1:13 PM  Montreal Cognitive Assessment   Visuospatial/ Executive (0/5) 5 5 5   Naming (0/3) 3 3 3   Attention: Read list of digits (0/2) 2 2 2   Attention: Read list of letters (0/1) 1 1 1   Attention: Serial 7 subtraction starting at 100 (0/3) 3 3 3   Language: Repeat phrase (0/2) 2 2 2   Language : Fluency (0/1) 1 1 1   Abstraction (0/2) 2 2 2   Delayed Recall (0/5) 5 4 3   Orientation (0/6) 6 5 6   Total 30 28 28    Adjusted Score (based on education)   28    CRANIAL NERVE:  2nd, 3rd, 4th, 6th -visual fields full to confrontation, extraocular muscles intact, no nystagmus 5th - facial sensation symmetric 7th - facial strength symmetric 8th - hearing intact 9th - palate elevates symmetrically, uvula midline 11th - shoulder shrug symmetric 12th - tongue protrusion midline  MOTOR:  normal bulk and tone, full strength in the BUE, BLE  GAIT/STATION:  normal   DIAGNOSTIC DATA (LABS, IMAGING, TESTING) - I reviewed patient records, labs, notes, testing and imaging myself where available.  Lab Results  Component Value Date   WBC 7.8 10/15/2023   HGB 15.1 10/15/2023   HCT 45.9 10/15/2023   MCV 90.5 10/15/2023   PLT 174 10/15/2023      Component Value Date/Time   NA 138 10/15/2023 1411   NA 139 11/10/2022 1259   K 4.8 10/15/2023 1411   CL 104 10/15/2023 1411   CO2 30 10/15/2023 1411   GLUCOSE 100 (H) 10/15/2023 1411   BUN 25 (H) 10/15/2023 1411   BUN 22 11/10/2022 1259   CREATININE 1.11 10/15/2023 1411   CALCIUM 9.4 10/15/2023 1411   PROT 6.9 10/15/2023 1411   PROT 6.7 08/07/2022 0903   ALBUMIN 4.4 10/15/2023 1411   ALBUMIN 4.6 08/07/2022 0903   AST 18 10/15/2023 1411   ALT 15 10/15/2023 1411   ALKPHOS 106 10/15/2023 1411   BILITOT 0.5 10/15/2023 1411   GFRNONAA >60 10/15/2023 1411   GFRAA >60 06/14/2020 1436   Lab Results  Component Value Date   CHOL 151 12/12/2022   HDL 41 12/12/2022   LDLCALC 92 12/12/2022   TRIG 99 12/12/2022   CHOLHDL 3.7 12/12/2022   Lab Results  Component Value Date   HGBA1C 5.8 (H) 09/08/2022   Lab Results  Component Value Date   VITAMINB12 1,137 08/28/2022   Lab Results  Component Value Date   TSH 0.784 12/19/2021    MRI Brain 05/25/2020 No acute infarction, hemorrhage, hydrocephalus, extra-axial collection or mass lesion. Few remote white matter insults, nonspecific. Normal brain volume  Formal neuropsychological evaluation  02/08/2023 IMPRESSION: Neurocognitive testing fell entirely within or exceeded expectations. His responses on self-report measures indicated a clinically significant degree of depressive symptoms and suggested that he likely meets criteria for ADHD.  Overall, this is a normal neuropsychological exam. His history, reported symptoms, and overall presentation are compelling for the presence of ADHD, combined type. However, as demonstrated during the current  evaluation, he is able to perform quite well when in a structured environment. I suspect that he noticed that his cognitive symptoms worsened upon retirement due to the loss of a routine and structure in his daily life, which is common in individuals with ADHD. It seems likely that chronic pain and disrupted sleep may also be contributing to his cognitive and mood symptoms.   FINAL DIAGNOSES (ICD-10 considerations):  Cognitive complaints with normal neuropsychological exam ADHD, combined type  RECOMMENDATIONS: Given his report of dysfunction in his daily life related to ADHD symptoms, could consider a trial of medication for ADHD symptoms, though defer to prescribing providers. Considering his medical history and possible medical contraindications to psychostimulant use, it may be beneficial to consider a trial of a nonstimulant medication such as Intuniv, Strattera, or Viloxazine. Continued efforts at managing cerebrovascular risk factors is strongly recommended. Mr. Colello's responses indicated clinically significant depressive symptoms and he also described experiencing stress related to his health. As such, engaging in psychotherapy should be considered to better manage mood symptoms. This can help him work towards developing healthy coping mechanisms for stress and implementing lifestyle modifications that can help to support ongoing mental health.  Mr. Litzinger is encouraged to attend to lifestyle factors for brain health (e.g., regular  physical exercise, good nutrition habits, regular participation in cognitively-stimulating activities, social engagement, etc). If cognitive concerns persist or worsen, re-evaluation can be considered in 1 year and the current evaluation can serve as a baseline for comparison.   ASSESSMENT AND PLAN  73 y.o. year old male with chronic pain, depression, history of lung cancer, who is presenting for follow up for his memory problem.  He did have a formal neuropsychological testing, no deficit noted, result was within normal range.  Patient was diagnosed with ADHD, started on stimulant and his Aricept was discontinued.  He feels like his focus is better but is still having deficit in executive functioning.  Advised him to follow-up with psychiatry, he can ask his PCP for a referral or he can make an appointment with [place] attention specialist for further treatment.  He voiced understanding.  Return as needed.   1. Cognitive complaints with normal neuropsychological exam   2. Attention deficit hyperactivity disorder (ADHD), unspecified ADHD type     Patient Instructions  Continue current medication Continue follow-up PCP and psychiatry Return as needed.  No orders of the defined types were placed in this encounter.   No orders of the defined types were placed in this encounter.   Return if symptoms worsen or fail to improve.  I have spent a total of 30 minutes dedicated to this patient today, preparing to see patient, performing a medically appropriate examination and evaluation, ordering tests and/or medications and procedures, and counseling and educating the patient/family/caregiver; independently interpreting result and communicating results to the family/patient/caregiver; and documenting clinical information in the electronic medical record.   Cassandra Cleveland, MD 12/26/2023, 3:14 PM  [facility] [address], [address] [place], [place] [address] [phone]

## 2023-12-31 ENCOUNTER — Encounter (HOSPITAL_COMMUNITY): Payer: Self-pay

## 2023-12-31 ENCOUNTER — Ambulatory Visit (HOSPITAL_COMMUNITY)
Admission: RE | Admit: 2023-12-31 | Discharge: 2023-12-31 | Disposition: A | Discharge status: Home / Self Care | Source: Ambulatory Visit | Attending: Internal Medicine | Admitting: Internal Medicine

## 2023-12-31 DIAGNOSIS — C3490 Malignant neoplasm of unspecified part of unspecified bronchus or lung: Secondary | ICD-10-CM

## 2024-03-05 ENCOUNTER — Telehealth: Payer: Self-pay

## 2024-03-05 NOTE — Telephone Encounter (Signed)
   Name: [name] [name]  DOB: [birth year]  MRN: [id number]  Primary Cardiologist: Vinie JAYSON Maxcy, MD  Chart reviewed as part of pre-operative protocol coverage. Because of [name] [name]'s past medical history and time since last visit, he will require a follow-up in-office visit in order to better assess preoperative cardiovascular risk.  Pre-op covering staff: - Please schedule appointment and call patient to inform them. If patient already had an upcoming appointment within acceptable timeframe, please add pre-op clearance to the appointment notes so provider is aware. - Please contact requesting surgeon's office via preferred method (i.e, phone, fax) to inform them of need for appointment prior to surgery.   [name], [place]  03/05/2024, 1:22 PM

## 2024-03-05 NOTE — Telephone Encounter (Signed)
   Pre-operative Risk Assessment    Patient Name: [name] [name]  DOB: 1951-08-17 MRN: [id number]   Date of last office visit: 04/05/23 VINIE MAXCY, MD Date of next office visit: NONE   Request for Surgical Clearance    Procedure:  COLONOSCOPY  Date of Surgery:  Clearance 04/17/24                                Surgeon:  DR ROSALIE Surgeon's Group or Practice Name:  [facility] Phone number:  [phone] Fax number:  [phone]   Type of Clearance Requested:   - Medical  - Pharmacy:  Hold Aspirin      Type of Anesthesia:  PROPOFOL    Additional requests/questions:    SignedLucie DELENA Ku   03/05/2024, 12:56 PM

## 2024-03-05 NOTE — Telephone Encounter (Signed)
 Patient has been scheduled for preop clearance office visit

## 2024-03-17 NOTE — Progress Notes (Addendum)
 Cardiology Clinic Note   Patient Name: [name] [name] Date of Encounter: 03/18/2024  Primary Care Provider:  [name], DO Primary Cardiologist:  Vinie JAYSON Maxcy, MD  Patient Profile    [name] [name] 73 year old male presents to clinic today for follow-up evaluation of his chronic systolic CHF and preoperative cardiac evaluation.  Past Medical History    Past Medical History:  Diagnosis Date   ADHD (attention deficit hyperactivity disorder)    Arthritis    Carpal tunnel syndrome    Right   Cataracts, bilateral    very mild   Coronary artery disease    Depression    Early cataracts, bilateral    Fatty liver    Gallstones    GERD (gastroesophageal reflux disease)    Headache 2011   HX OF VISUAL MIGRAINES - NO PAIN --MIGRAINES ATTRIBUTED TO PT CUTTING NICOTINE  PATCH IN HALF BEFORE APPLYING TO HIS SKIN.     Hepatitis 2001   C -treated and no problems since  2001 'cured'   History of hiatal hernia    patient denies this dx as of 04/13/23   History of kidney stones    found on CT Scan   HOH (hard of hearing)    wears hearing aids   lung ca dx'd 12/06/19   RIGHT Lower  LUNG   Mild cognitive impairment 12/19/2021   Muscle cramps    hands and legs   Osteopenia    Pain    KNEE OA   Pre-diabetes    diet controlled   Right lower lobe lung mass    Shortness of breath dyspnea    SOMETIMES FEELS LIKE I CAN'T CATCH A DEEP BREATH - ON GOING FOR YEARS - STATES MEDICAL DOCTOR SENT HIM FOR CARDIAC WORK UP 2011 - PT STATES HEART OK   Stroke (HCC)    HX POSSIBLE TIA - RT LEG NUMBNESS AND TINGLING FINGERS AND NUMBNESS RT FACE - ALL RESOLVED ; STATES NEUROLOGIST COULD NOT FIND ANY EVIDENCE OF STROKE - THOUGHT PT'S EPISODE MIGHT BE FROM CUTTING NICOTINE  PATCH IN HALF AND APPLYING TO SKIN   Trigger finger    LEFT MIDDLE FINGER   Wears glasses    Past Surgical History:  Procedure Laterality Date   ANTERIOR CERVICAL DECOMP/DISCECTOMY FUSION N/A 09/26/2022   Procedure:  Cervical four-five Anterior Cervical Decompression Fusion;  Surgeon: Colon Shove, MD;  Location: [facility] OR;  Service: Neurosurgery;  Laterality: N/A;   BRONCHIAL BIOPSY  02/24/2020   Procedure: BRONCHIAL BIOPSIES;  Surgeon: Shelah Lamar RAMAN, MD;  Location: [facility] ENDOSCOPY;  Service: Pulmonary;;   BRONCHIAL BRUSHINGS  02/24/2020   Procedure: BRONCHIAL BRUSHINGS;  Surgeon: Shelah Lamar RAMAN, MD;  Location: [facility] ENDOSCOPY;  Service: Pulmonary;;   BRONCHIAL NEEDLE ASPIRATION BIOPSY  02/24/2020   Procedure: BRONCHIAL NEEDLE ASPIRATION BIOPSIES;  Surgeon: Shelah Lamar RAMAN, MD;  Location: [facility] ENDOSCOPY;  Service: Pulmonary;;   BRONCHIAL WASHINGS  02/24/2020   Procedure: BRONCHIAL WASHINGS;  Surgeon: Shelah Lamar RAMAN, MD;  Location: [facility] ENDOSCOPY;  Service: Pulmonary;;   COLONOSCOPY     CORONARY PRESSURE/FFR STUDY N/A 08/09/2022   Procedure: INTRAVASCULAR PRESSURE WIRE/FFR STUDY;  Surgeon: Wendel Lurena POUR, MD;  Location: [facility] INVASIVE CV LAB;  Service: Cardiovascular;  Laterality: N/A;   INTERCOSTAL NERVE BLOCK Right 04/28/2020   Procedure: INTERCOSTAL NERVE BLOCK;  Surgeon: Shyrl Linnie KIDD, MD;  Location: [facility] OR;  Service: Thoracic;  Laterality: Right;   left ankle surgery     fractured   LEFT HEART CATH AND  CORONARY ANGIOGRAPHY N/A 06/29/2021   Procedure: LEFT HEART CATH AND CORONARY ANGIOGRAPHY;  Surgeon: Wendel Lurena POUR, MD;  [facility]: [facility] INVASIVE CV LAB;  Service: Cardiovascular;  Laterality: N/A;   LEFT HEART CATH AND CORONARY ANGIOGRAPHY N/A 08/09/2022   Procedure: LEFT HEART CATH AND CORONARY ANGIOGRAPHY;  Surgeon: Wendel Lurena POUR, MD;  Location: [facility] INVASIVE CV LAB;  Service: Cardiovascular;  Laterality: N/A;   left knee surgery     MENISCAL REMOVED   left shoulder     torn ligaments left shoulder   LUMBAR FUSION     L3 L4 L5   NODE DISSECTION Right 04/28/2020   Procedure: NODE DISSECTION;  Surgeon: Shyrl Linnie KIDD, MD;  Location: [facility] OR;  Service: Thoracic;  Laterality: Right;   RIGHT CARPAL  TUNNEL RELEASE  2011   RIGHT KNEE ARTHROSCOPY   AUG OR SEPT 2015   AT [facility]   ROOT CANAL     ROTATOR CUFF REPAIR Left    2007   THORACOSCOPY Right 04/28/2020   XI ROBOTIC ASSISTED THORASCOPY-LOBECTOMY (Right)   TONSILLECTOMY     as child   TOTAL KNEE ARTHROPLASTY Left 07/14/2013   Procedure: LEFT TOTAL KNEE ARTHROPLASTY;  Surgeon: Donnice JONETTA Car, MD;  Location: [facility] ORS;  Service: Orthopedics;  Laterality: Left;   TOTAL KNEE ARTHROPLASTY Right 07/21/2014   Procedure: RIGHT TOTAL KNEE ARTHROPLASTY;  Surgeon: Donnice JONETTA Car, MD;  Location: [facility] ORS;  Service: Orthopedics;  Laterality: Right;   TOTAL SHOULDER ARTHROPLASTY Right 10/17/2019   Procedure: RIGHT REVERSE TOTAL SHOULDER REPLACEMENT;  Surgeon: Kay Kemps, MD;  Location: [facility] ORS;  Service: Orthopedics;  Laterality: Right;  interscalene block   TRIGGER FINGER RELEASE Left    UMBILICAL HERNIA REPAIR N/A 10/17/2023   Procedure: HERNIA REPAIR UMBILICAL ADULT;  Surgeon: Polly Cordella LABOR, MD;  Location: [facility] OR;  Service: General;  Laterality: N/A;  GEN AND TAP BLOCK   VIDEO BRONCHOSCOPY WITH ENDOBRONCHIAL NAVIGATION Right 02/24/2020   Procedure: VIDEO BRONCHOSCOPY WITH ENDOBRONCHIAL NAVIGATION;  Surgeon: Shelah Lamar RAMAN, MD;  Location: [facility] ENDOSCOPY;  Service: Pulmonary;  Laterality: Right;   WEDGE RESECTION Right 04/28/2020   XI ROBOTIC ASSISTED THORASCOPY-RIGHT LOWER LOBE WEDGE RESECTION (Right Chest)     Allergies  Allergies  Allergen Reactions   Statins Other (See Comments)    MYALGIAS   Chantix [Varenicline] Other (See Comments)    Ineffective; patient did not like the way it made him feel   Diclofenac Sodium Other (See Comments)   Tricor [Fenofibrate] Other (See Comments)    ineffective for cholesterol (2014)   Zetia [Ezetimibe] Other (See Comments)    ineffective for cholesterol   Rosuvastatin Calcium Other (See Comments)    Muscle pain    History of Present Illness    [name] [name] has a PMH of  chronic systolic CHF LVEF 35-40%, abnormal coronary CT with coronary calcium score greater than 1200, hyperlipidemia, statin intolerance, lung cancer status post wedge resection, and small PFO.  He was seen in follow-up by Dr. Mona on 04/05/2023.  During that time he noted that he was unable to tolerate Entresto  and Jardiance .  He reported he had a lesion on his spine which he felt may be recurrent cancer.  He was scheduled for upcoming appointments to evaluate for CA.  He was tolerating his Repatha  well.  Repeat lipid panel was planned for 6 months.  He was planning to proceed with stimulant therapy for his ADHD.  He remained stable from a cardiac standpoint.  Follow-up was planned for 6 months.  He presents to the clinic today for follow-up evaluation and preoperative cardiac evaluation.  He states he feels well.  He has been exercising doing yard work and recently travel to [place].  He reports that he was there for 12 days visiting his daughter.  He was walking 12,000 steps per day.  He did well with this.  He feels much less fatigued and like he has increased endurance.  His blood pressure today is 114/72.  We reviewed his previous blood pressures.  We reviewed his previous reduced systolic EF.  We also reviewed his previous clinic visit.  He expressed understanding.  He notes that his cancer screening last year was negative.  He has had recent blood work.  We reviewed this.  I will repeat his echocardiogram and plan follow-up in 6 months.  Today he denies chest pain, shortness of breath, lower extremity edema, fatigue, palpitations, melena, hematuria, hemoptysis, diaphoresis, weakness, presyncope, syncope, orthopnea, and PND.      Home Medications    Prior to Admission medications   Medication Sig Start Date End Date Taking? Authorizing Provider  amphetamine-dextroamphetamine (ADDERALL XR) 20 MG 24 hr capsule Take 40 mg by mouth daily.    [provider]  aspirin  EC 81 MG tablet Take  81 mg by mouth daily. Swallow whole.    [provider]  clindamycin  (CLEOCIN ) 150 MG capsule Take 600 mg by mouth See admin instructions. Take 4 capsules (600 mg) by mouth prior to dental appointments 12/23/19   [provider]  cyclobenzaprine  (FLEXERIL ) 5 MG tablet Take 1 tablet (5 mg total) by mouth 3 (three) times daily as needed for muscle spasms. 08/25/23   White, Elizabeth A, PA-C  Evolocumab  (REPATHA  SURECLICK) 140 MG/ML SOAJ INJECT 140 MG INTO THE SKIN EVERY 14 (FOURTEEN) DAYS. 12/10/23   Hilty, Vinie BROCKS, MD  magnesium  oxide (MAG-OX) 400 (240 Mg) MG tablet Take 400 mg by mouth daily.    [provider]  sertraline  (ZOLOFT ) 100 MG tablet Take 100 mg by mouth in the morning. 05/13/21   [provider]  tamsulosin  (FLOMAX ) 0.4 MG CAPS capsule Take 0.4 mg by mouth in the morning. 03/24/21   [provider]    Family History    Family History  Problem Relation Age of Onset   Alcohol  abuse Father    Hyperlipidemia Father    Heart attack Brother 32   Hyperlipidemia Brother    Hyperlipidemia Sister        3 sisters   Heart attack Brother    Hyperlipidemia Brother    He indicated that his mother is deceased. He indicated that his father is deceased. He indicated that his sister is alive. He indicated that both of his brothers are alive.  Social History    Social History   Socioeconomic History   Marital status: Married    Spouse name: Not on file   Number of children: Not on file   Years of education: Not on file   Highest education level: Not on file  Occupational History   Not on file  Tobacco Use   Smoking status: Former    Current packs/day: 0.00    Types: Cigarettes    Quit date: 04/30/2012    Years since quitting: 11.8   Smokeless tobacco: Never  Vaping Use   Vaping status: Former  Substance and Sexual Activity   Alcohol  use: Yes    Alcohol /week: 7.0 standard drinks of alcohol   Types: 7 Shots of liquor per week     Comment: Now drinks 2 beers per week   Drug use: Not Currently    Types: Marijuana    Comment: ocassionally, last time smoked 07/2017   Sexual activity: Not Currently  Other Topics Concern   Not on file  Social History Narrative   Not on file   Social Drivers of Health   Financial Resource Strain: Not on file  Food Insecurity: No Food Insecurity (04/06/2023)   Hunger Vital Sign    Worried About Running Out of Food in the Last Year: Never true    Ran Out of Food in the Last Year: Never true  Transportation Needs: No Transportation Needs (04/06/2023)   PRAPARE - Administrator, Civil Service (Medical): No    Lack of Transportation (Non-Medical): No  Physical Activity: Not on file  Stress: Not on file  Social Connections: Not on file  Intimate Partner Violence: Not At Risk (04/06/2023)   Humiliation, Afraid, Rape, and Kick questionnaire    Fear of Current or Ex-Partner: No    Emotionally Abused: No    Physically Abused: No    Sexually Abused: No     Review of Systems    General:  No chills, fever, night sweats or weight changes.  Cardiovascular:  No chest pain, dyspnea on exertion, edema, orthopnea, palpitations, paroxysmal nocturnal dyspnea. Dermatological: No rash, lesions/masses Respiratory: No cough, dyspnea Urologic: No hematuria, dysuria Abdominal:   No nausea, vomiting, diarrhea, bright red blood per rectum, melena, or hematemesis Neurologic:  No visual changes, wkns, changes in mental status. All other systems reviewed and are otherwise negative except as noted above.  Physical Exam    VS:  BP 114/72   Pulse 83   Ht 6' 1 (1.854 m)   Wt 197 lb (89.4 kg)   SpO2 94%   BMI 25.99 kg/m  , BMI Body mass index is 25.99 kg/m. GEN: Well nourished, well developed, in no acute distress. HEENT: normal. Neck: Supple, no JVD, carotid bruits, or masses. Cardiac: RRR, no murmurs, rubs, or gallops. No clubbing, cyanosis, edema.  Radials/DP/PT 2+ and equal  bilaterally.  Respiratory:  Respirations regular and unlabored, clear to auscultation bilaterally. GI: Soft, nontender, nondistended, BS + x 4. MS: no deformity or atrophy. Skin: warm and dry, no rash. Neuro:  Strength and sensation are intact. Psych: Normal affect.  Accessory Clinical Findings    Recent Labs: 10/15/2023: ALT 15; BUN 25; Creatinine 1.11; Hemoglobin 15.1; Platelet Count 174; Potassium 4.8; Sodium 138   Recent Lipid Panel    Component Value Date/Time   CHOL 151 12/12/2022 0956   TRIG 99 12/12/2022 0956   HDL 41 12/12/2022 0956   CHOLHDL 3.7 12/12/2022 0956   CHOLHDL 5.6 10/31/2007 0830   VLDL 26 10/31/2007 0830   LDLCALC 92 12/12/2022 0956         ECG personally reviewed by me today- EKG Interpretation Date/Time:  Tuesday March 18 2024 14:10:43 EDT Ventricular Rate:  86 PR Interval:  154 QRS Duration:  94 QT Interval:  380 QTC Calculation: 454 R Axis:   -56  Text Interpretation: Normal sinus rhythm Left anterior fascicular block When compared with ECG of 05-Apr-2023 11:29, No significant change was found Confirmed by Emelia Hazy [id number]) on 03/18/2024 2:12:03 PM  Echocardiogram 09/21/2022  IMPRESSIONS     1. Left ventricular ejection fraction, by estimation, is 35 to 40%. Left  ventricular ejection fraction by 3D volume is 39 %. The  left ventricle has  moderately decreased function. The left ventricle demonstrates global  hypokinesis. There is mild left  ventricular hypertrophy. Left ventricular diastolic parameters are  consistent with Grade I diastolic dysfunction (impaired relaxation).   2. Right ventricular systolic function is normal. The right ventricular  size is normal. Tricuspid regurgitation signal is inadequate for assessing  PA pressure.   3. The mitral valve is abnormal. Trivial mitral valve regurgitation.   4. The aortic valve is tricuspid. Aortic valve regurgitation is not  visualized. Aortic valve sclerosis is present, with no evidence  of aortic  valve stenosis.   5. The inferior vena cava is normal in size with greater than 50%  respiratory variability, suggesting right atrial pressure of 3 mmHg.   Comparison(s): No prior Echocardiogram.   FINDINGS   Left Ventricle: Left ventricular ejection fraction, by estimation, is 35  to 40%. Left ventricular ejection fraction by 3D volume is 39 %. The left  ventricle has moderately decreased function. The left ventricle  demonstrates global hypokinesis. The left  ventricular internal cavity size was normal in size. There is mild left  ventricular hypertrophy. Left ventricular diastolic parameters are  consistent with Grade I diastolic dysfunction (impaired relaxation).  Normal left ventricular filling pressure.   Right Ventricle: The right ventricular size is normal. No increase in  right ventricular wall thickness. Right ventricular systolic function is  normal. Tricuspid regurgitation signal is inadequate for assessing PA  pressure.   Left Atrium: Left atrial size was normal in size.   Right Atrium: Right atrial size was normal in size.   Pericardium: There is no evidence of pericardial effusion.   Mitral Valve: The mitral valve is abnormal. There is mild thickening of  the anterior and posterior mitral valve leaflet(s). Trivial mitral valve  regurgitation.   Tricuspid Valve: The tricuspid valve is normal in structure. Tricuspid  valve regurgitation is not demonstrated.   Aortic Valve: The aortic valve is tricuspid. Aortic valve regurgitation is  not visualized. Aortic valve sclerosis is present, with no evidence of  aortic valve stenosis. Aortic valve mean gradient measures 3.0 mmHg.  Aortic valve peak gradient measures 5.3   mmHg. Aortic valve area, by VTI measures 1.79 cm.   Pulmonic Valve: The pulmonic valve was grossly normal. Pulmonic valve  regurgitation is trivial.   Aorta: The aortic root and ascending aorta are structurally normal, with  no evidence  of dilitation.   Venous: The inferior vena cava is normal in size with greater than 50%  respiratory variability, suggesting right atrial pressure of 3 mmHg.   IAS/Shunts: No atrial level shunt detected by color flow Doppler.    Coronary calcium score 02/06/2020  FINDINGS: Non-cardiac: See separate report from [facility].   Ascending Aorta: Normal caliber.  Aortic atherosclerosis.   Pericardium: Normal   Coronary arteries: Normal origin.   Dilated pulmonary artery to 34 mm, suggesting of pulmonary hypertension.   IMPRESSION: Coronary calcium score of 1222. This was 93th percentile for age and sex matched control.   Dilated main pulmonary artery to 34 mm, suggesting pulmonary hypertension   Aortic atherosclerosis   Spiculated lung mass, see radiology report for further details.     Electronically Signed   By: Vinie JAYSON Maxcy M.D.   On: 02/06/2020 14:31     Assessment & Plan   1.  HFrEF-no increased activity tolerance or DOE.  Echocardiogram 09/21/2022 showed an LVEF of 35-40%, mild LVH, G1 DD, trivial mitral valve regurgitation and no other significant valvular  abnormalities.  Previously unable to tolerate Entresto  and Jardiance .  Plan was to add beta-blocker therapy as tolerated for CHF.  BP today 114/72.  Notes that he was recently in [place] for 12 days and able to walk 12,000 steps per day.  He denied fatigue or lack of endurance with this. Heart healthy low-sodium diet Increase physical activity as tolerated Daily weights Repeat echocardiogram    Coronary artery disease-denies chest pain today.  Denies recent episodes of exertional type chest discomfort.  Noted to have abnormal coronary CT a with a coronary calcium score of 1222.  He was noted to have subtotal occlusion of his RCA with significant LAD and circumflex disease.  His FFR was positive and proximal RCA.  He underwent LHC 11/22.  Medical management was recommended. Heart healthy low-sodium  diet Continue Repatha , aspirin  Increase physical activity as tolerated  Hyperlipidemia-LDL 75 on 01/17/24. High-fiber diet Continue Repatha , aspirin    Carotid artery stenosis-denies episodes of lightheadedness, presyncope or syncope.  Noted to have minimal bilateral carotid plaque 11/07. High-fiber diet Continue current medical therapy  Preoperative cardiac evaluation-colonoscopy, Date of Surgery:  Clearance 04/17/24                                  Surgeon:  DR ROSALIE Surgeon's Group or Practice Name:  [facility] Phone number:  [phone] Fax number:  [phone]    Primary Cardiologist: Vinie JAYSON Maxcy, MD  Chart reviewed as part of pre-operative protocol coverage. Given past medical history and time since last visit, based on ACC/AHA guidelines, [name] [name] would be at acceptable risk for the planned procedure without further cardiovascular testing.   Regarding ASA therapy, we recommend continuation of ASA throughout the perioperative period. However, if the surgeon feels that cessation of ASA is required in the perioperative period, it may be stopped 5-7 days prior to surgery with a plan to resume it as soon as felt to be feasible from a surgical standpoint in the post-operative period.    Patient was advised that if he develops new symptoms prior to surgery to contact our office to arrange a follow-up appointment.  He verbalized understanding.    Disposition: Follow-up with Dr. Maxcy or me in 6 months.   Josefa HERO. Shaynna Husby NP-C     03/18/2024, 2:18 PM  [facility] [address] Office [phone] Fax [phone]    I spent 14 minutes examining this patient, reviewing medications, and using patient centered shared decision making involving their cardiac care.   I spent  20 minutes reviewing past medical history,  medications, and prior cardiac tests.

## 2024-03-18 ENCOUNTER — Ambulatory Visit: Attending: General Practice | Admitting: General Practice

## 2024-03-18 ENCOUNTER — Encounter: Payer: Self-pay | Admitting: General Practice

## 2024-03-18 VITALS — BP 114/72 | HR 83 | Ht 73.0 in | Wt 197.0 lb

## 2024-03-18 DIAGNOSIS — I5020 Unspecified systolic (congestive) heart failure: Secondary | ICD-10-CM | POA: Diagnosis present

## 2024-03-18 DIAGNOSIS — I2510 Atherosclerotic heart disease of native coronary artery without angina pectoris: Secondary | ICD-10-CM | POA: Diagnosis present

## 2024-03-18 DIAGNOSIS — I6522 Occlusion and stenosis of left carotid artery: Secondary | ICD-10-CM | POA: Diagnosis present

## 2024-03-18 DIAGNOSIS — Z01810 Encounter for preprocedural cardiovascular examination: Secondary | ICD-10-CM | POA: Diagnosis not present

## 2024-03-18 DIAGNOSIS — E785 Hyperlipidemia, unspecified: Secondary | ICD-10-CM | POA: Insufficient documentation

## 2024-03-18 NOTE — Patient Instructions (Signed)
 Medication Instructions:  No medication changes *If you need a refill on your cardiac medications before your next appointment, please call your pharmacy*  Lab Work: No lab work today If you have labs (blood work) drawn today and your tests are completely normal, you will receive your results only by: MyChart Message (if you have MyChart) OR A paper copy in the mail If you have any lab test that is abnormal or we need to change your treatment, we will call you to review the results.  Testing/Procedures: Echocardiogram Your physician has requested that you have an echocardiogram. Echocardiography is a painless test that uses sound waves to create images of your heart. It provides your doctor with information about the size and shape of your heart and how well your heart's chambers and valves are working. This procedure takes approximately one hour. There are no restrictions for this procedure. Please do NOT wear cologne, perfume, aftershave, or lotions (deodorant is allowed). Please arrive 15 minutes prior to your appointment time.  Please note: We ask at that you not bring children with you during ultrasound (echo/ vascular) testing. Due to room size and safety concerns, children are not allowed in the ultrasound rooms during exams. Our front office staff cannot provide observation of children in our lobby area while testing is being conducted. An adult accompanying a patient to their appointment will only be allowed in the ultrasound room at the discretion of the ultrasound technician under special circumstances. We apologize for any inconvenience.   Follow-Up: At [facility], you and your health needs are our priority.  As part of our continuing mission to provide you with exceptional heart care, our providers are all part of one team.  This team includes your primary Cardiologist (physician) and Advanced Practice Providers or APPs (Physician Assistants and Nurse Practitioners) who  all work together to provide you with the care you need, when you need it.  Your next appointment:   6 month(s)  Provider:   Emelia, NP or Mona, MD  We recommend signing up for the patient portal called MyChart.  Sign up information is provided on this After Visit Summary.  MyChart is used to connect with patients for Virtual Visits (Telemedicine).  Patients are able to view lab/test results, encounter notes, upcoming appointments, etc.  Non-urgent messages can be sent to your provider as well.   To learn more about what you can do with MyChart, go to [email or web address].   Other Instructions Continue with physical activity You are cleared for your colonoscopy. Hold Aspirin  for 5-7 days prior to your procedure.

## 2024-04-14 ENCOUNTER — Inpatient Hospital Stay: Payer: Medicare Other | Attending: Internal Medicine

## 2024-04-14 ENCOUNTER — Ambulatory Visit (HOSPITAL_COMMUNITY)
Admission: RE | Admit: 2024-04-14 | Discharge: 2024-04-14 | Disposition: A | Discharge status: Home / Self Care | Source: Ambulatory Visit | Attending: Internal Medicine | Admitting: Internal Medicine

## 2024-04-14 DIAGNOSIS — C3490 Malignant neoplasm of unspecified part of unspecified bronchus or lung: Secondary | ICD-10-CM | POA: Insufficient documentation

## 2024-04-14 DIAGNOSIS — Z902 Acquired absence of lung [part of]: Secondary | ICD-10-CM | POA: Diagnosis not present

## 2024-04-14 DIAGNOSIS — R000 Tachycardia, unspecified: Secondary | ICD-10-CM | POA: Diagnosis not present

## 2024-04-14 DIAGNOSIS — Z85118 Personal history of other malignant neoplasm of bronchus and lung: Secondary | ICD-10-CM | POA: Diagnosis present

## 2024-04-14 DIAGNOSIS — Z9221 Personal history of antineoplastic chemotherapy: Secondary | ICD-10-CM | POA: Insufficient documentation

## 2024-04-14 LAB — CMP (CANCER CENTER ONLY)
ALT: 21 U/L (ref 0–44)
AST: 16 U/L (ref 15–41)
Albumin: 4.4 g/dL (ref 3.5–5.0)
Alkaline Phosphatase: 109 U/L (ref 38–126)
Anion gap: 5 (ref 5–15)
BUN: 25 mg/dL — ABNORMAL HIGH (ref 8–23)
CO2: 29 mmol/L (ref 22–32)
Calcium: 9.2 mg/dL (ref 8.9–10.3)
Chloride: 101 mmol/L (ref 98–111)
Creatinine: 1 mg/dL (ref 0.61–1.24)
GFR, Estimated: >60 mL/min (ref >60)
Glucose, Bld: 182 mg/dL — ABNORMAL HIGH (ref 70–99)
Potassium: 4 mmol/L (ref 3.5–5.1)
Sodium: 135 mmol/L (ref 135–145)
Total Bilirubin: 1 mg/dL (ref 0.0–1.2)
Total Protein: 7 g/dL (ref 6.5–8.1)

## 2024-04-14 LAB — CBC WITH DIFFERENTIAL (CANCER CENTER ONLY)
Abs Immature Granulocytes: 0.05 K/uL (ref 0.00–0.07)
Basophils Absolute: 0 K/uL (ref 0.0–0.1)
Basophils Relative: 0 %
Eosinophils Absolute: 0 K/uL (ref 0.0–0.5)
Eosinophils Relative: 0 %
HCT: 47.7 % (ref 39.0–52.0)
Hemoglobin: 16.3 g/dL (ref 13.0–17.0)
Immature Granulocytes: 1 %
Lymphocytes Relative: 9 %
Lymphs Abs: 1 K/uL (ref 0.7–4.0)
MCH: 30 pg (ref 26.0–34.0)
MCHC: 34.2 g/dL (ref 30.0–36.0)
MCV: 87.7 fL (ref 80.0–100.0)
Monocytes Absolute: 0.6 K/uL (ref 0.1–1.0)
Monocytes Relative: 6 %
Neutro Abs: 9.1 K/uL — ABNORMAL HIGH (ref 1.7–7.7)
Neutrophils Relative %: 84 %
Platelet Count: 167 K/uL (ref 150–400)
RBC: 5.44 MIL/uL (ref 4.22–5.81)
RDW: 13.2 % (ref 11.5–15.5)
WBC Count: 10.9 K/uL — ABNORMAL HIGH (ref 4.0–10.5)
nRBC: 0 % (ref 0.0–0.2)

## 2024-04-14 MED ORDER — IOHEXOL 300 MG/ML  SOLN
75.0000 mL | Freq: Once | INTRAMUSCULAR | Status: AC | PRN
  Administered 2024-04-14: 75 mL via INTRAVENOUS

## 2024-04-19 ENCOUNTER — Ambulatory Visit: Payer: Self-pay | Admitting: Internal Medicine

## 2024-04-21 ENCOUNTER — Inpatient Hospital Stay (HOSPITAL_BASED_OUTPATIENT_CLINIC_OR_DEPARTMENT_OTHER): Payer: Medicare Other | Admitting: Internal Medicine

## 2024-04-21 VITALS — BP 115/57 | Temp 98.0°F | Resp 17 | Ht 73.0 in | Wt 194.0 lb

## 2024-04-21 DIAGNOSIS — C3490 Malignant neoplasm of unspecified part of unspecified bronchus or lung: Secondary | ICD-10-CM

## 2024-04-21 DIAGNOSIS — Z85118 Personal history of other malignant neoplasm of bronchus and lung: Secondary | ICD-10-CM | POA: Diagnosis not present

## 2024-04-21 NOTE — Progress Notes (Signed)
 [name] [phone]   [phone]  OFFICE PROGRESS NOTE  [name], DO 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   PRIOR THERAPY:   1) Status post right lower lobectomy with lymph node dissection under the care of Dr. Shyrl on 04/28/2020. 2)  Adjuvant systemic chemotherapy with cisplatin  75 mg/m2 and Alimta  500 mg/mm IV every 3 weeks.  First dose expected on 06/16/2020.  Status post 4 cycles.   CURRENT THERAPY:  Observation.  INTERVAL HISTORY: [name] [name] 73 y.o. male returns to the clinic today for 52-month follow-up visit.Discussed the use of AI scribe software for clinical note transcription with the patient, who gave verbal consent to proceed.  History of Present Illness [name] [name] is a 73 year old male with stage IIIA non-small cell lung cancer who presents for evaluation with repeat CT scan of the chest for restaging of his disease.  Diagnosed with stage IIIA non-small cell lung cancer, adenocarcinoma, in August 2021, he underwent a right lower lobectomy with lymph node dissection. This was followed by four cycles of adjuvant systemic chemotherapy with cisplatin  and Alimta , completed in January 2022.  Since completing chemotherapy, he has been on observation with no new symptoms such as chest pain, breathing issues, nausea, vomiting, diarrhea, headaches, or weight loss in the past six months. He is currently feeling well and has not experienced any concerning symptoms.  He monitors his blood sugar, which was noted to be 188 mg/dL, but he was not fasting at the time of the test.  He is currently taking Adderall, with a regimen of 25 mg extended release in the morning and 30 mg regular quick release in the afternoon.  Socially, he is engaged in  designing a three-dimensional crossword game, which involves printing pieces and working on illustrations to explain how to play.     MEDICAL HISTORY: Past Medical History:  Diagnosis Date   ADHD (attention deficit hyperactivity disorder)    Arthritis    Carpal tunnel syndrome    Right   Cataracts, bilateral    very mild   Coronary artery disease    Depression    Early cataracts, bilateral    Fatty liver    Gallstones    GERD (gastroesophageal reflux disease)    Headache 2011   HX OF VISUAL MIGRAINES - NO PAIN --MIGRAINES ATTRIBUTED TO PT CUTTING NICOTINE  PATCH IN HALF BEFORE APPLYING TO HIS SKIN.     Hepatitis 2001   C -treated and no problems since  2001 'cured'   History of hiatal hernia    patient denies this dx as of 04/13/23   History of kidney stones    found on CT Scan   HOH (hard of hearing)    wears hearing aids   lung ca dx'd 12/06/19   RIGHT Lower  LUNG   Mild cognitive impairment 12/19/2021   Muscle cramps    hands and legs   Osteopenia    Pain    KNEE OA   Pre-diabetes    diet controlled   Right lower lobe lung mass    Shortness of breath dyspnea    SOMETIMES FEELS LIKE I CAN'T CATCH A DEEP BREATH - ON GOING FOR YEARS - STATES MEDICAL DOCTOR SENT HIM FOR CARDIAC WORK UP 2011 - PT STATES HEART OK  Stroke (HCC)    HX POSSIBLE TIA - RT LEG NUMBNESS AND TINGLING FINGERS AND NUMBNESS RT FACE - ALL RESOLVED ; STATES NEUROLOGIST COULD NOT FIND ANY EVIDENCE OF STROKE - THOUGHT PT'S EPISODE MIGHT BE FROM CUTTING NICOTINE  PATCH IN HALF AND APPLYING TO SKIN   Trigger finger    LEFT MIDDLE FINGER   Wears glasses     ALLERGIES:  is allergic to statins, chantix [varenicline], diclofenac sodium, tricor [fenofibrate], zetia [ezetimibe], and rosuvastatin calcium.  MEDICATIONS:  Current Outpatient Medications  Medication Sig Dispense Refill   amphetamine-dextroamphetamine (ADDERALL XR) 20 MG 24 hr capsule Take 40 mg by mouth daily.     aspirin  EC 81 MG tablet Take  81 mg by mouth daily. Swallow whole.     clindamycin  (CLEOCIN ) 150 MG capsule Take 600 mg by mouth See admin instructions. Take 4 capsules (600 mg) by mouth prior to dental appointments     cyclobenzaprine  (FLEXERIL ) 5 MG tablet Take 1 tablet (5 mg total) by mouth 3 (three) times daily as needed for muscle spasms. 30 tablet 0   diclofenac Sodium (VOLTAREN ARTHRITIS PAIN) 1 % GEL Apply 1 Application topically 2 (two) times daily.     Evolocumab  (REPATHA  SURECLICK) 140 MG/ML SOAJ INJECT 140 MG INTO THE SKIN EVERY 14 (FOURTEEN) DAYS. 6 mL 1   magnesium  oxide (MAG-OX) 400 (240 Mg) MG tablet Take 400 mg by mouth daily.     sertraline  (ZOLOFT ) 100 MG tablet Take 100 mg by mouth in the morning.     tamsulosin  (FLOMAX ) 0.4 MG CAPS capsule Take 0.4 mg by mouth in the morning.     No current facility-administered medications for this visit.    SURGICAL HISTORY:  Past Surgical History:  Procedure Laterality Date   ANTERIOR CERVICAL DECOMP/DISCECTOMY FUSION N/A 09/26/2022   Procedure: Cervical four-five Anterior Cervical Decompression Fusion;  Surgeon: Colon Shove, MD;  Location: [facility] OR;  Service: Neurosurgery;  Laterality: N/A;   BRONCHIAL BIOPSY  02/24/2020   Procedure: BRONCHIAL BIOPSIES;  Surgeon: Shelah Lamar RAMAN, MD;  Location: [facility] ENDOSCOPY;  Service: Pulmonary;;   BRONCHIAL BRUSHINGS  02/24/2020   Procedure: BRONCHIAL BRUSHINGS;  Surgeon: Shelah Lamar RAMAN, MD;  Location: [facility] ENDOSCOPY;  Service: Pulmonary;;   BRONCHIAL NEEDLE ASPIRATION BIOPSY  02/24/2020   Procedure: BRONCHIAL NEEDLE ASPIRATION BIOPSIES;  Surgeon: Shelah Lamar RAMAN, MD;  Location: [facility] ENDOSCOPY;  Service: Pulmonary;;   BRONCHIAL WASHINGS  02/24/2020   Procedure: BRONCHIAL WASHINGS;  Surgeon: Shelah Lamar RAMAN, MD;  Location: [facility] ENDOSCOPY;  Service: Pulmonary;;   COLONOSCOPY     CORONARY PRESSURE/FFR STUDY N/A 08/09/2022   Procedure: INTRAVASCULAR PRESSURE WIRE/FFR STUDY;  Surgeon: Wendel Lurena POUR, MD;  Location: [facility] INVASIVE CV LAB;   Service: Cardiovascular;  Laterality: N/A;   INTERCOSTAL NERVE BLOCK Right 04/28/2020   Procedure: INTERCOSTAL NERVE BLOCK;  Surgeon: Shyrl Linnie KIDD, MD;  Location: [facility] OR;  Service: Thoracic;  Laterality: Right;   left ankle surgery     fractured   LEFT HEART CATH AND CORONARY ANGIOGRAPHY N/A 06/29/2021   Procedure: LEFT HEART CATH AND CORONARY ANGIOGRAPHY;  Surgeon: Wendel Lurena POUR, MD;  Location: [facility] INVASIVE CV LAB;  Service: Cardiovascular;  Laterality: N/A;   LEFT HEART CATH AND CORONARY ANGIOGRAPHY N/A 08/09/2022   Procedure: LEFT HEART CATH AND CORONARY ANGIOGRAPHY;  Surgeon: Wendel Lurena POUR, MD;  Location: [facility] INVASIVE CV LAB;  Service: Cardiovascular;  Laterality: N/A;   left knee surgery     MENISCAL REMOVED   left shoulder  torn ligaments left shoulder   LUMBAR FUSION     L3 L4 L5   NODE DISSECTION Right 04/28/2020   Procedure: NODE DISSECTION;  Surgeon: Shyrl Linnie KIDD, MD;  Location: [facility] OR;  Service: Thoracic;  Laterality: Right;   RIGHT CARPAL TUNNEL RELEASE  2011   RIGHT KNEE ARTHROSCOPY   AUG OR SEPT 2015   AT [facility]   ROOT CANAL     ROTATOR CUFF REPAIR Left    2007   THORACOSCOPY Right 04/28/2020   XI ROBOTIC ASSISTED THORASCOPY-LOBECTOMY (Right)   TONSILLECTOMY     as child   TOTAL KNEE ARTHROPLASTY Left 07/14/2013   Procedure: LEFT TOTAL KNEE ARTHROPLASTY;  Surgeon: Donnice JONETTA Car, MD;  Location: [facility] ORS;  Service: Orthopedics;  Laterality: Left;   TOTAL KNEE ARTHROPLASTY Right 07/21/2014   Procedure: RIGHT TOTAL KNEE ARTHROPLASTY;  Surgeon: Donnice JONETTA Car, MD;  Location: [facility] ORS;  Service: Orthopedics;  Laterality: Right;   TOTAL SHOULDER ARTHROPLASTY Right 10/17/2019   Procedure: RIGHT REVERSE TOTAL SHOULDER REPLACEMENT;  Surgeon: Kay Kemps, MD;  Location: [facility] ORS;  Service: Orthopedics;  Laterality: Right;  interscalene block   TRIGGER FINGER RELEASE Left    UMBILICAL HERNIA REPAIR N/A 10/17/2023   Procedure: HERNIA REPAIR  UMBILICAL ADULT;  Surgeon: Polly Cordella LABOR, MD;  Location: [facility] OR;  Service: General;  Laterality: N/A;  GEN AND TAP BLOCK   VIDEO BRONCHOSCOPY WITH ENDOBRONCHIAL NAVIGATION Right 02/24/2020   Procedure: VIDEO BRONCHOSCOPY WITH ENDOBRONCHIAL NAVIGATION;  Surgeon: Shelah Lamar RAMAN, MD;  Location: [facility] ENDOSCOPY;  Service: Pulmonary;  Laterality: Right;   WEDGE RESECTION Right 04/28/2020   XI ROBOTIC ASSISTED THORASCOPY-RIGHT LOWER LOBE WEDGE RESECTION (Right Chest)     REVIEW OF SYSTEMS:  A comprehensive review of systems was negative.   PHYSICAL EXAMINATION: General appearance: alert, cooperative, and no distress Head: Normocephalic, without obvious abnormality, atraumatic Neck: no adenopathy, no JVD, supple, symmetrical, trachea midline, and thyroid  not enlarged, symmetric, no tenderness/mass/nodules Lymph nodes: Cervical, supraclavicular, and axillary nodes normal. Resp: clear to auscultation bilaterally Back: symmetric, no curvature. ROM normal. No CVA tenderness. Cardio: regular rate and rhythm, S1, S2 normal, no murmur, click, rub or gallop GI: soft, non-tender; bowel sounds normal; no masses,  no organomegaly Extremities: extremities normal, atraumatic, no cyanosis or edema  ECOG PERFORMANCE STATUS: 1 - Symptomatic but completely ambulatory   Blood pressure (!) 115/57, temperature 98 F (36.7 C), temperature source Temporal, resp. rate 17, height 6' 1 (1.854 m), weight 194 lb (88 kg), SpO2 98%.  LABORATORY DATA: Lab Results  Component Value Date   WBC 10.9 (H) 04/14/2024   HGB 16.3 04/14/2024   HCT 47.7 04/14/2024   MCV 87.7 04/14/2024   PLT 167 04/14/2024      Chemistry      Component Value Date/Time   NA 135 04/14/2024 1357   NA 139 11/10/2022 1259   K 4.0 04/14/2024 1357   CL 101 04/14/2024 1357   CO2 29 04/14/2024 1357   BUN 25 (H) 04/14/2024 1357   BUN 22 11/10/2022 1259   CREATININE 1.00 04/14/2024 1357      Component Value Date/Time   CALCIUM 9.2 04/14/2024  1357   ALKPHOS 109 04/14/2024 1357   AST 16 04/14/2024 1357   ALT 21 04/14/2024 1357   BILITOT 1.0 04/14/2024 1357       RADIOGRAPHIC STUDIES: CT Chest W Contrast Result Date: 04/18/2024 CLINICAL DATA:  Non-small cell lung cancer, status post right lower lobectomy EXAM: CT CHEST  WITH CONTRAST TECHNIQUE: Multidetector CT imaging of the chest was performed during intravenous contrast administration. RADIATION DOSE REDUCTION: This exam was performed according to the departmental dose-optimization program which includes automated exposure control, adjustment of the mA and/or kV according to patient size and/or use of iterative reconstruction technique. CONTRAST:  75mL OMNIPAQUE  IOHEXOL  300 MG/ML  SOLN COMPARISON:  Chest CT October 15, 2023 FINDINGS: Cardiovascular: Atherosclerotic calcifications of coronary arteries. The heart size is normal. No pericardial fluid. Pulmonary artery is slightly prominent measuring 3 cm. Mediastinum/Nodes: No enlarged mediastinal, hilar, or axillary lymph nodes. Thyroid  gland, trachea, and esophagus demonstrate no significant findings. Lungs/Pleura: Status post right lower lobectomy. No suspicious finding at the surgical stump to suggest recurrent malignancy. There is a right middle lobe nodule measuring 5.5 mm, stable to prior. (5/140). Calcified micronodule in right upper lobe (5/58). No new nodules identified. No pleural effusion. Upper Abdomen: Multiple enhancing splenic lesions measuring up to 12 mm, indeterminate likely represent hemangiomas stable to prior. Right shoulder. Cholelithiasis. Musculoskeletal: Right shoulder hemiarthroplasty. No suspicious osseous lesion. IMPRESSION: Right lower lobectomy without suspicious finding to suggest locally recurrent malignancy. Right middle lobe nodule stable to prior. Follow-up according to oncologic protocols. Mild enlargement of pulmonary artery which can be seen the setting of pulmonary artery hypertension. Atherosclerotic  calcifications of coronary arteries. Cholelithiasis. Stable enhancing splenic lesions, query multiple hemangiomas. Electronically Signed   By: Megan  Zare M.D.   On: 04/18/2024 17:14     ASSESSMENT AND PLAN: This is a very pleasant 73 years old white male with a stage IIIa non-small cell lung cancer, mucinous adenocarcinoma status post right lower lobectomy with lymph node dissection in August 2021. The patient underwent adjuvant systemic chemotherapy with cisplatin  and Alimta  status post 4 cycles. The patient tolerated the previous 4 cycles of his treatment well with no concerning adverse effect except for fatigue. The patient has been on observation since that time and he is feeling fine with no concerning complaints except for the recent low back pain.  He was found to have a suspicious lesion at the L3 by his neurosurgeon. The patient had CT of the lumbar spine performed earlier today.  I personally and independently reviewed the imaging studies with the patient and his wife.  The scan showed bubbly lytic lesion at L3 and L5 bodies centered on the left L3 and bilateral L5 pedicle screws and this pattern are suggestive of reactive process to the pedicle screws but of course with his history of lung cancer metastatic disease or multiple myeloma could not be completely excluded. A PET scan on April 01, 2023 showed no concerning finding for disease recurrence but there was hypermetabolic activity in the L3-5 concerning for metastatic disease.  The patient underwent biopsy under the care of Dr. Colon and it was negative for malignancy. The patient is currently on observation. He had repeat CT scan of the chest performed recently.  I personally and independently reviewed the scan and discussed the result with the patient today.  His scan showed no concerning findings for disease recurrence or metastasis. Assessment and Plan Assessment & Plan Stage IIIA right lung adenocarcinoma, post-lobectomy, under  surveillance Stage IIIA right lung adenocarcinoma, status post right lower lobectomy with lymph node dissection and adjuvant chemotherapy completed in January 2022. Currently under surveillance with no new symptoms such as chest pain, breathing issues, nausea, vomiting, diarrhea, headaches, or weight loss. Recent CT scan of the chest shows stable disease with no concerning findings. Disease has been stable for approximately  three and a half years since completion of treatment. - Continue surveillance with CT scan of the chest in six months.  Elevated heart rate, under evaluation Reports of elevated heart rate, but no known atrial fibrillation. Heart rate was elevated during the visit, but no immediate concerns were identified. He is scheduled to see a cardiologist at the [facility] on April 25, 2024. - Recheck heart rate during the visit. - Follow up with cardiologist at the [facility] on April 25, 2024. The patient was advised to call immediately if he has any concerning symptoms in the interval.  The patient voices understanding of current disease status and treatment options and is in agreement with the current care plan.  All questions were answered. The patient knows to call the clinic with any problems, questions or concerns. We can certainly see the patient much sooner if necessary.  Disclaimer: This note was dictated with voice recognition software. Similar sounding words can inadvertently be transcribed and may not be corrected upon review.

## 2024-04-22 ENCOUNTER — Telehealth: Payer: Self-pay | Admitting: Internal Medicine

## 2024-04-22 NOTE — Telephone Encounter (Signed)
Scheduled appointments with the patient

## 2024-04-25 ENCOUNTER — Ambulatory Visit (HOSPITAL_COMMUNITY)
Admission: RE | Admit: 2024-04-25 | Discharge: 2024-04-25 | Disposition: A | Discharge status: Home / Self Care | Source: Ambulatory Visit | Attending: General Practice | Admitting: General Practice

## 2024-04-25 DIAGNOSIS — I5020 Unspecified systolic (congestive) heart failure: Secondary | ICD-10-CM | POA: Diagnosis not present

## 2024-04-25 LAB — ECHOCARDIOGRAM COMPLETE
Area-P 1/2: 3.89 cm2
S' Lateral: 3.74 cm

## 2024-04-28 ENCOUNTER — Ambulatory Visit: Payer: Self-pay | Admitting: General Practice

## 2024-04-28 DIAGNOSIS — I5020 Unspecified systolic (congestive) heart failure: Secondary | ICD-10-CM

## 2024-05-20 ENCOUNTER — Encounter (INDEPENDENT_AMBULATORY_CARE_PROVIDER_SITE_OTHER): Payer: Self-pay

## 2024-06-01 NOTE — Progress Notes (Unsigned)
 Cardiology Office Note   Date:  06/03/2024  ID:  Jasraj Lappe, DOB Jan 28, 1951, MRN [id number] PCP: [name], DO  [facility] Providers Cardiologist:  Vinie JAYSON Maxcy, MD Electrophysiologist:  [name], MD   History of Present Illness [name] [name] is a 73 y.o. male with HFrEF, CAD, HLD, statin intolerance, lung CA s/p wedge resection and small PFO who is evaluated for discussion regarding 1' prevention ICD.   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  His game got picked up by [place] and [employer] which then got required by [employer].  He has 1 daughter in [place].  He and his wife have lived locally in [place] but are considering moving abroad to be closer to his daughter and because of his concern about the political climate.  He was seen by [name] [name] on 03/18/2024 at that time was NYHA class I HF with no activity limitations.  TTE on 09/21/2022 with LVEF 35-40%.  He has previously been unable to tolerate Entresto  or Jardiance .  He had an abnormal coronary CT with a CAC of 1222 with subtotal occlusion of his RCA and significant LAD and circumflex disease.  His FFR was positive in the proximal RCA and he underwent subsequent coronary angiography (06/29/21) where medical therapy was recommended.  He had CTO of his RCA that was collateralized along with mild obstructive disease of his OM and LAD with high-grade stenosis of the distal circumflex which was small and supplied a small area of myocardium.  Repeat echo on 04/25/2024 with further decline in LVEF to 30-35%.  ROS: none   Studies Reviewed  ECG review 03/18/24: NSR 86, PR 154, QRS 94, QT/c 380/454, LAFB  TTE  Result date: 04/25/24  1. Left ventricular ejection fraction, by estimation, is 30 to 35%. The  left ventricle has moderately decreased function. The left ventricle  demonstrates  global hypokinesis. There is mild concentric left ventricular  hypertrophy. Left ventricular  diastolic parameters are consistent with Grade I diastolic dysfunction  (impaired relaxation).   2. Right ventricular systolic function is normal. The right ventricular  size is normal.   3. The mitral valve is normal in structure. Trivial mitral valve  regurgitation. No evidence of mitral stenosis.   4. The aortic valve is tricuspid. Aortic valve regurgitation is not  visualized. Aortic valve sclerosis is present, with no evidence of aortic  valve stenosis.   5. The inferior vena cava is normal in size with greater than 50%  respiratory variability, suggesting right atrial pressure of 3 mmHg.   Coronary angiography  Result date: 06/29/21   Prox RCA lesion is 100% stenosed.   Ost LAD lesion is 10% stenosed.   1st Mrg lesion is 30% stenosed.   Dist Cx lesion is 90% stenosed.   Mid LAD lesion is 20% stenosed.   LV end diastolic pressure is normal. 1.  Chronic total occlusion of right coronary artery collateralized by right to right and septal collaterals. 2.  Mild obstructive coronary artery disease of the OM and LAD with high-grade disease of the distal left circumflex which is a very small vessel and subtends a small area. 3.  Normal LVEDP.    Risk Assessment/Calculations           Physical Exam VS:  BP 120/62   Pulse 63   Ht 6' 1 (1.854 m)  Wt 194 lb (88 kg)   SpO2 96%   BMI 25.60 kg/m        Wt Readings from Last 3 Encounters:  06/03/24 194 lb (88 kg)  04/21/24 194 lb (88 kg)  03/18/24 197 lb (89.4 kg)    GEN: Well nourished, well developed in no acute distress CARDIAC: RRR, no murmurs, rubs, gallops RESPIRATORY:  Clear to auscultation without rales, wheezing or rhonchi  ABDOMEN: Soft, non-tender, non-distended EXTREMITIES:  No edema; No deformity   ASSESSMENT AND PLAN [name] [name] is a 73 y.o. male with HFrEF, CAD, HLD, statin intolerance, lung CA s/p wedge  resection and small PFO who is evaluated for discussion regarding 1' prevention ICD.   ICM/HFrEF NYHA class I  Statin intolerance  We discussed his HFrEF history and decline in LVEF to 30-35%.  He has CTO to the RCA and distal circumflex disease that was not amenable to revascularization.  He has been trialed on Entresto  and Jardiance  but has been intolerant to both.  He has baseline low blood pressures and he had presyncopal symptoms and lightheadedness with HFrEF medications.  He is on Repatha  for lipid management.  He has had no documented arrhythmias to this point but with his reduced LVEF that continues to decline and no expectation for recovery he is at risk for SCD from VT/VF.  We reviewed the risk, benefits, and alternatives of primary prevention single-chamber ICD implantation which include but are not limited to incisional pain, infection, bleeding, damage to the heart or lungs requiring emergency drain or surgery, damage to the valves or intrinsic conduction system, life-threatening arrhythmias and or death.  He understands that the ICD is purely preventative for dangerous arrhythmias and will not improve his heart function.  He does not currently have an indication for pacing with heart rates in the 80s and no conduction abnormalities. Plan for device implant on 07/09/24.   Pre procedural:  LEFT Biotronik Jameson ICD implant  Hold ASA 07/09/24 Not on OAC Allergies reviewed and non pertinent   Dispo: f/u post ICD implant   A total of 52 minutes was spent preparing for the patient, reviewing history, performing exam, document encounter, coordinating care and counseling the patient. 15 minutes was spent with direct patient care.   Signed, [name], MD

## 2024-06-03 ENCOUNTER — Encounter: Payer: Self-pay | Admitting: Student in an Organized Health Care Education/Training Program

## 2024-06-03 ENCOUNTER — Ambulatory Visit
Attending: Student in an Organized Health Care Education/Training Program | Admitting: Student in an Organized Health Care Education/Training Program

## 2024-06-03 VITALS — BP 120/62 | HR 63 | Ht 73.0 in | Wt 194.0 lb

## 2024-06-03 DIAGNOSIS — Z01812 Encounter for preprocedural laboratory examination: Secondary | ICD-10-CM | POA: Insufficient documentation

## 2024-06-03 DIAGNOSIS — I5020 Unspecified systolic (congestive) heart failure: Secondary | ICD-10-CM | POA: Diagnosis present

## 2024-06-03 NOTE — Patient Instructions (Addendum)
 Medication Instructions:  Your physician recommends that you continue on your current medications as directed. Please refer to the Current Medication list given to you today.  *If you need a refill on your cardiac medications before your next appointment, please call your pharmacy*  Lab Work: None ordered.  If you have labs (blood work) drawn today and your tests are completely normal, you will receive your results only by: MyChart Message (if you have MyChart) OR A paper copy in the mail If you have any lab test that is abnormal or we need to change your treatment, we will call you to review the results.  Testing/Procedures: Your physician has recommended that you have a defibrillator inserted. An implantable cardioverter defibrillator (ICD) is a small device that is placed in your chest or, in rare cases, your abdomen. This device uses electrical pulses or shocks to help control life-threatening, irregular heartbeats that could lead the heart to suddenly stop beating (sudden cardiac arrest). Leads are attached to the ICD that goes into your heart. This is done in the hospital and usually requires an overnight stay. Please see the instruction sheet given to you today for more information.   Follow-Up: At [facility], you and your health needs are our priority.  As part of our continuing mission to provide you with exceptional heart care, our providers are all part of one team.  This team includes your primary Cardiologist (physician) and Advanced Practice Providers or APPs (Physician Assistants and Nurse Practitioners) who all work together to provide you with the care you need, when you need it.  Your next appointment:   To be scheduled

## 2024-06-04 ENCOUNTER — Telehealth: Payer: Self-pay | Admitting: Student in an Organized Health Care Education/Training Program

## 2024-06-04 NOTE — Telephone Encounter (Signed)
 Pt also would like to know if the ICD will be MRI compatible. He stated that he has previously had cancer and back issues and has required several MRI's in the past and suspects to need more in the future.

## 2024-06-04 NOTE — Telephone Encounter (Signed)
 Pt needs to r/s ICD Implant - is available any time after 1 PM today (9/24) for a call  Spoke with Charlott Hurst and she said it was okay to go directly to you to ask to r/s

## 2024-06-05 ENCOUNTER — Telehealth: Payer: Self-pay

## 2024-06-05 NOTE — Telephone Encounter (Signed)
Work up complete. 

## 2024-06-05 NOTE — Telephone Encounter (Signed)
-----   Message from Nurse Aldona R sent at 06/03/2024  9:52 AM EDT ----- Regarding: 10/29 ICD implant with Almetta Important: list procedure date as first item in subject line, followed by procedure type (e.g., 05/23/24 PPM implant)  Precert:  MD: Almetta Type of implant: ICD Device manufacturer: provider unsure at this time Diagnosis: cardiomyopathy CPT code: ICD implant - 33249 C-code(s), including quantity (if indicated): C1777 Procedure scheduled (date/time): 10/29 at 1030am  Procedure:  Scrub given? Yes  Medication instructions: Hold ASA the morning of procedure Message sent to CVRR? N/a Added to calendar? Yes Orders entered? No, >30 days before procedure Letter complete? Yes Scheduled with cath lab? Yes Labs ordered (CBC, BMET, PT/INR if on warfarin)? Yes Dye allergy? Yes Pre-meds ordered and instructions given? N/A Letter method: given in clinic Special instructions:  H&P: 9/23  Follow-up:  Cassie/Angel, please schedule Routine.  Covering RN:  Please send this message to [employer], EP scheduler, EP Scheduling pool, and EP [employer].

## 2024-06-18 LAB — BASIC METABOLIC PANEL WITH GFR
BUN/Creatinine Ratio: 23 (ref 10–24)
BUN: 22 mg/dL (ref 8–27)
CO2: 22 mmol/L (ref 20–29)
Calcium: 9.5 mg/dL (ref 8.6–10.2)
Chloride: 98 mmol/L (ref 96–106)
Creatinine, Ser: 0.97 mg/dL (ref 0.76–1.27)
Glucose: 92 mg/dL (ref 70–99)
Potassium: 4.4 mmol/L (ref 3.5–5.2)
Sodium: 137 mmol/L (ref 134–144)
eGFR: 83 mL/min/1.73 (ref >59)

## 2024-06-18 LAB — CBC
Hematocrit: 48.6 % (ref 37.5–51.0)
Hemoglobin: 16 g/dL (ref 13.0–17.7)
MCH: 30 pg (ref 26.6–33.0)
MCHC: 32.9 g/dL (ref 31.5–35.7)
MCV: 91 fL (ref 79–97)
Platelets: 163 x10E3/uL (ref 150–450)
RBC: 5.33 x10E6/uL (ref 4.14–5.80)
RDW: 13.3 % (ref 11.6–15.4)
WBC: 6.9 x10E3/uL (ref 3.4–10.8)

## 2024-06-19 ENCOUNTER — Telehealth (HOSPITAL_COMMUNITY): Payer: Self-pay

## 2024-06-19 ENCOUNTER — Encounter (HOSPITAL_COMMUNITY): Payer: Self-pay

## 2024-06-19 NOTE — Telephone Encounter (Signed)
 Spoke with patient to complete pre-procedure call.     Health status review:  Any new medical conditions, recent signs of acute illness or been started on antibiotics? No Any recent hospitalizations or surgeries? No Any new medications started since pre-op visit? No  Follow all medication instructions prior to procedure or the procedure may be rescheduled:    On the morning of your procedure HOLD Aspirin . You may take all other morning medications with a small sip of water .  The night before your procedure and the morning of your procedure, wash thoroughly with the CHG surgical soap from the neck down, paying special attention to the area where your procedure will be performed.  Nothing to eat or drink after midnight prior to your procedure.  Pre-procedure testing scheduled: lab work completed .  Confirmed patient is scheduled for Implantable cardioverter defibrilator (ICD) on Thursday, October 30 with Dr. Almetta. Instructed patient to arrive at the Main Entrance A at [facility]: [address] [place], [place] [address] and check in at Admitting at 9:30 AM.  Advised of plan to go home the same day and will only stay overnight if medically necessary. You MUST have a responsible adult to drive you home and MUST be with you the first 24 hours after you arrive home or your procedure could be cancelled.  Informed patient a nurse will call a day before the procedure to confirm arrival time and ensure instructions are followed.  Patient verbalized understanding to information provided and is agreeable to proceed with procedure.   Advised patient to contact RN Navigator at [phone], to inform of any new medications started after call or concerns prior to procedure.

## 2024-07-08 ENCOUNTER — Encounter: Payer: Self-pay | Admitting: Internal Medicine

## 2024-07-08 ENCOUNTER — Other Ambulatory Visit: Payer: Self-pay | Admitting: Internal Medicine

## 2024-07-09 NOTE — Pre-Procedure Instructions (Signed)
Attempted to call patient regarding procedure instructions.  Left voicemail on the following items: °Arrival time 0930 °Nothing to eat or drink after midnight °No meds AM of procedure °Responsible person to drive you home and stay with you for 24 hrs ° °   °

## 2024-07-10 ENCOUNTER — Other Ambulatory Visit: Payer: Self-pay

## 2024-07-10 ENCOUNTER — Ambulatory Visit (HOSPITAL_COMMUNITY)
Admission: RE | Disposition: A | Payer: Self-pay | Source: Home / Self Care | Attending: Student in an Organized Health Care Education/Training Program

## 2024-07-10 ENCOUNTER — Encounter (HOSPITAL_COMMUNITY): Payer: Self-pay | Admitting: Student in an Organized Health Care Education/Training Program

## 2024-07-10 ENCOUNTER — Ambulatory Visit (HOSPITAL_COMMUNITY)

## 2024-07-10 ENCOUNTER — Ambulatory Visit (HOSPITAL_COMMUNITY)
Admission: RE | Admit: 2024-07-10 | Discharge: 2024-07-10 | Disposition: A | Discharge status: Home / Self Care | Attending: Student in an Organized Health Care Education/Training Program | Admitting: Student in an Organized Health Care Education/Training Program

## 2024-07-10 DIAGNOSIS — I444 Left anterior fascicular block: Secondary | ICD-10-CM | POA: Diagnosis not present

## 2024-07-10 DIAGNOSIS — I2582 Chronic total occlusion of coronary artery: Secondary | ICD-10-CM | POA: Insufficient documentation

## 2024-07-10 DIAGNOSIS — I2510 Atherosclerotic heart disease of native coronary artery without angina pectoris: Secondary | ICD-10-CM | POA: Diagnosis not present

## 2024-07-10 DIAGNOSIS — I429 Cardiomyopathy, unspecified: Secondary | ICD-10-CM | POA: Diagnosis present

## 2024-07-10 DIAGNOSIS — I255 Ischemic cardiomyopathy: Secondary | ICD-10-CM | POA: Insufficient documentation

## 2024-07-10 DIAGNOSIS — I5022 Chronic systolic (congestive) heart failure: Secondary | ICD-10-CM

## 2024-07-10 DIAGNOSIS — Z79899 Other long term (current) drug therapy: Secondary | ICD-10-CM | POA: Diagnosis not present

## 2024-07-10 DIAGNOSIS — E785 Hyperlipidemia, unspecified: Secondary | ICD-10-CM | POA: Insufficient documentation

## 2024-07-10 DIAGNOSIS — Q2112 Patent foramen ovale: Secondary | ICD-10-CM | POA: Insufficient documentation

## 2024-07-10 DIAGNOSIS — I5020 Unspecified systolic (congestive) heart failure: Secondary | ICD-10-CM | POA: Insufficient documentation

## 2024-07-10 SURGERY — ICD IMPLANT
Anesthesia: LOCAL

## 2024-07-10 MED ORDER — CHLORHEXIDINE GLUCONATE 4 % EX SOLN: 4.0000 | Freq: Once | CUTANEOUS | Status: DC

## 2024-07-10 MED ORDER — LIDOCAINE HCL (PF) 1 % IJ SOLN
INTRAMUSCULAR | Status: DC | PRN
  Administered 2024-07-10: 50 mL

## 2024-07-10 MED ORDER — HEPARIN (PORCINE) IN NACL 1000-0.9 UT/500ML-% IV SOLN
INTRAVENOUS | Status: DC | PRN
  Administered 2024-07-10: 500 mL

## 2024-07-10 MED ORDER — MIDAZOLAM HCL 5 MG/5ML IJ SOLN
INTRAMUSCULAR | Status: DC | PRN
  Administered 2024-07-10 (×2): 1 mg via INTRAVENOUS
  Administered 2024-07-10: .5 mg via INTRAVENOUS

## 2024-07-10 MED ORDER — CEFAZOLIN SODIUM-DEXTROSE 2-4 GM/100ML-% IV SOLN: 2.0000 g | INTRAVENOUS | Status: AC

## 2024-07-10 MED ORDER — LIDOCAINE HCL (PF) 1 % IJ SOLN
INTRAMUSCULAR | Status: AC
  Filled 2024-07-10: qty 60

## 2024-07-10 MED ORDER — MIDAZOLAM HCL 2 MG/2ML IJ SOLN
INTRAMUSCULAR | Status: AC
  Filled 2024-07-10: qty 2

## 2024-07-10 MED ORDER — FENTANYL CITRATE (PF) 100 MCG/2ML IJ SOLN
INTRAMUSCULAR | Status: DC | PRN
  Administered 2024-07-10: 50 ug via INTRAVENOUS
  Administered 2024-07-10 (×2): 25 ug via INTRAVENOUS

## 2024-07-10 MED ORDER — SODIUM CHLORIDE 0.9 % IV SOLN: INTRAVENOUS | Status: DC

## 2024-07-10 MED ORDER — FENTANYL CITRATE (PF) 100 MCG/2ML IJ SOLN
INTRAMUSCULAR | Status: AC
  Filled 2024-07-10: qty 2

## 2024-07-10 MED ORDER — ONDANSETRON HCL 4 MG/2ML IJ SOLN: 4.0000 mg | Freq: Four times a day (QID) | INTRAMUSCULAR | Status: DC | PRN

## 2024-07-10 MED ORDER — ACETAMINOPHEN 325 MG PO TABS: 325.0000 mg | ORAL_TABLET | ORAL | Status: DC | PRN

## 2024-07-10 MED ORDER — CEFAZOLIN SODIUM-DEXTROSE 2-4 GM/100ML-% IV SOLN
INTRAVENOUS | Status: AC
  Administered 2024-07-10: 2 g via INTRAVENOUS
  Filled 2024-07-10: qty 100

## 2024-07-10 MED ORDER — SODIUM CHLORIDE 0.9 % IV SOLN
INTRAVENOUS | Status: AC
  Administered 2024-07-10: 80 mg
  Filled 2024-07-10: qty 2

## 2024-07-10 MED ORDER — POVIDONE-IODINE 10 % EX SWAB: 2.0000 | Freq: Once | CUTANEOUS | Status: DC

## 2024-07-10 MED ORDER — SODIUM CHLORIDE 0.9 % IV SOLN: 80.0000 mg | INTRAVENOUS | Status: AC

## 2024-07-10 SURGICAL SUPPLY — 6 items
CABLE SURGICAL S-101-97-12 (CABLE) ×1 IMPLANT
ICD ACTICOR DX (ICD Generator) IMPLANT
LEAD PAMIRA DX 65/17 (Lead) IMPLANT
PAD DEFIB RADIO PHYSIO CONN (PAD) ×1 IMPLANT
SHEATH 9FR PRELUDE SNAP 13 (SHEATH) IMPLANT
TRAY PACEMAKER INSERTION (PACKS) ×1 IMPLANT

## 2024-07-10 NOTE — Progress Notes (Signed)
 Discharge instructions reviewed with pt and wife at bedside. Denies questions or concerns. PT seen by device rep, MD, Xray and EKG. Ambulated in the hallway voided in the bathroom. Was able to tolerate PO intake. PT escorted from the unit via wheel chair to personal vehicle

## 2024-07-10 NOTE — H&P (Addendum)
 Date: 07/10/24 ID:  Alwaleed Obeso, DOB 08/03/1951, MRN [id number] PCP: [name], DO  [facility] Providers Cardiologist:  Vinie JAYSON Maxcy, MD Electrophysiologist:  [name], MD    History of Present Illness Monterio Bob is a 73 y.o. male with HFrEF, CAD, HLD, statin intolerance, lung CA s/p wedge resection and small PFO who is evaluated for discussion regarding 1' prevention ICD.    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  His game got picked up by [employer] which then got required by [employer].  He has 1 daughter in [place].  He and his wife have lived locally in [place] but are considering moving abroad to be closer to his daughter and because of his concern about the political climate.   He was seen by Josefa Beauvais on 03/18/2024 at that time was NYHA class I HF with no activity limitations.  TTE on 09/21/2022 with LVEF 35-40%.  He has previously been unable to tolerate Entresto  or Jardiance .  He had an abnormal coronary CT with a CAC of 1222 with subtotal occlusion of his RCA and significant LAD and circumflex disease.  His FFR was positive in the proximal RCA and he underwent subsequent coronary angiography (06/29/21) where medical therapy was recommended.  He had CTO of his RCA that was collateralized along with mild obstructive disease of his OM and LAD with high-grade stenosis of the distal circumflex which was small and supplied a small area of myocardium.   Repeat echo on 04/25/2024 with further decline in LVEF to 30-35%.   ROS: none    Studies Reviewed   ECG review 03/18/24: NSR 86, PR 154, QRS 94, QT/c 380/454, LAFB   TTE  Result date: 04/25/24  1. Left ventricular ejection fraction, by estimation, is 30 to 35%. The  left ventricle has moderately decreased function. The left ventricle  demonstrates global hypokinesis.  There is mild concentric left ventricular  hypertrophy. Left ventricular  diastolic parameters are consistent with Grade I diastolic dysfunction  (impaired relaxation).   2. Right ventricular systolic function is normal. The right ventricular  size is normal.   3. The mitral valve is normal in structure. Trivial mitral valve  regurgitation. No evidence of mitral stenosis.   4. The aortic valve is tricuspid. Aortic valve regurgitation is not  visualized. Aortic valve sclerosis is present, with no evidence of aortic  valve stenosis.   5. The inferior vena cava is normal in size with greater than 50%  respiratory variability, suggesting right atrial pressure of 3 mmHg.    Coronary angiography  Result date: 06/29/21   Prox RCA lesion is 100% stenosed.   Ost LAD lesion is 10% stenosed.   1st Mrg lesion is 30% stenosed.   Dist Cx lesion is 90% stenosed.   Mid LAD lesion is 20% stenosed.   LV end diastolic pressure is normal. 1.  Chronic total occlusion of right coronary artery collateralized by right to right and septal collaterals. 2.  Mild obstructive coronary artery disease of the OM and LAD with high-grade disease of the distal left circumflex which is a very small vessel and subtends a small area. 3.  Normal LVEDP.     Physical Exam VS:  BP 120/62   Pulse 63   Ht 6' 1 (1.854 m)   Wt 194 lb (88 kg)   SpO2  96%   BMI 25.60 kg/m        Wt Readings from Last 3 Encounters:  06/03/24 194 lb (88 kg)  04/21/24 194 lb (88 kg)  03/18/24 197 lb (89.4 kg)    GEN: Well nourished, well developed in no acute distress CARDIAC: RRR, no murmurs, rubs, gallops RESPIRATORY:  Clear to auscultation without rales, wheezing or rhonchi  ABDOMEN: Soft, non-tender, non-distended EXTREMITIES:  No edema; No deformity    ASSESSMENT AND PLAN Ronon Ferger is a 73 y.o. male with HFrEF, CAD, HLD, statin intolerance, lung CA s/p wedge resection and small PFO who is evaluated for discussion regarding  1' prevention ICD.    ICM/HFrEF NYHA class I  Statin intolerance  We discussed his HFrEF history and decline in LVEF to 30-35%.  He has CTO to the RCA and distal circumflex disease that was not amenable to revascularization.  He has been trialed on Entresto  and Jardiance  but has been intolerant to both.  He has baseline low blood pressures and he had presyncopal symptoms and lightheadedness with HFrEF medications.  He is on Repatha  for lipid management.  He has had no documented arrhythmias to this point but with his reduced LVEF that continues to decline and no expectation for recovery he is at risk for SCD from VT/VF.  We reviewed the risk, benefits, and alternatives of primary prevention single-chamber ICD implantation which include but are not limited to incisional pain, infection, bleeding, damage to the heart or lungs requiring emergency drain or surgery, damage to the valves or intrinsic conduction system, life-threatening arrhythmias and or death.  He understands that the ICD is purely preventative for dangerous arrhythmias and will not improve his heart function.  He does not currently have an indication for pacing with heart rates in the 80s and no conduction abnormalities. Plan for device implant on 07/09/24.    Pre procedural:  LEFT Biotronik [place] ICD implant  Hold ASA 07/09/24 Not on OAC Allergies reviewed and non pertinent    Signed, [name], MD

## 2024-07-10 NOTE — Op Note (Signed)
   Procedure:  LEFT sided BTK Single Chamber DX ICD Implant  Pre-op Diagnosis: HFrEF/ICM    Post-op Diagnosis: same  Procedure Date: 07/10/24  Attending: Adina Primus, MD  Anesthesia: monitored anesthesia care   Procedure Report:  The LEFT shoulder was prepped with chlorhexidine  scrub. 40 cc lidocaine  was injected at the planned incision site. A pocket was created with electrocautery and blunt dissection.    No venogram was performed. The LEFT axillary vein was accessed using a standard access needle under ultrasound guidance. A wire was passed into the IVC.   An 8 Fr sheath was advanced and the dilator removed. The RV ICD lead was inserted into the sheath and the lead was then advanced into the RVOT. After exchange of pigtail shaped stylet for a curved stylet, the lead was retracted into the RV cavity and advanced to the RV apical septum. Two view flouroscopy then confirmed RV apical, septal position. The helix was extended and the lead fixation was tested with lead tension. Interrogation revealed a current of injury with acceptable lead parameters (see below). The atrial sensing on the atrial dipole was adequate (1-1.5 mV) The sheath was split and removed and the RV ICD lead was firmly attached to the pre-pectoralis fascia with two 0-silk sutures.   The device was then connected to the leads. The pocket was irrigated with gentamicin  solution. The pocket was closed with continuous 2-0 V-Loc and 3-0 V-Loc. Dermabond was applied over the incision.   The device was evaluated by the representative of the cardiac device manufacturer with implant parameters listed below.   Complications: none Estimated blood loss: less than 50 mL  Implanted Hardware: Implant Name Type Inv. Item Serial No. Manufacturer Lot No. LRB No. Used Action  LEAD PAMIRA DX 65/17 - [id number] Lead LEAD PAMIRA DX 65/17 [id number] [employer]  N/A 1 Implanted  ICD ACTICOR DX - [id number] ICD Generator ICD ACTICOR DX  [id number] BI[employer]  Left 1 Implanted   Device Information: ICD Generator: BTK Acticor 7 VR-T DX [id number], SN [id number], DOI 07/10/24 RV: CONLEY Dixons S DX 65/17 [id number], SN [id number], DOI 07/10/24  Lead Interrogation Data: RA: 6.0 mV   RV: 5.9 mV / 617 ohms / 0.7 V @ 0.4 ms Shock Impedance: 66 ohms  Final Device Settings: VDI 40  Summary: 1. Successful implant of a LEFT sided BTK [place] ICD  Recommendations: Routine post-procedure care with bedrest for 3 hours No heparin  (IV or subcutaneous) for 48 hours. No enoxaparin  (IV or subcutaneous) for 7 days.  PA/lateral CXR post procedure Wound check in clinic  Device interrogation post CXR Same day discharge  [name] DELENA Primus, MD [facility]  Cardiac Electrophysiology

## 2024-07-10 NOTE — Interval H&P Note (Signed)
 History and Physical Interval Note:  07/10/2024 10:31 AM  [name] [name]  has presented today for surgery, with the diagnosis of cardiomyopathy.  The various methods of treatment have been discussed with the patient and family. After consideration of risks, benefits and other options for treatment, the patient has consented to  Procedure(s): ICD IMPLANT (N/A) as a surgical intervention.  The patient's history has been reviewed, patient examined, no change in status, stable for surgery.  I have reviewed the patient's chart and labs.  Questions were answered to the patient's satisfaction.    [name] [name]

## 2024-07-10 NOTE — Discharge Instructions (Signed)
 After Your ICD (Implantable Cardiac Defibrillator)   You have a Biotronik ICD  If you have a Medtronic or Biotronik device, plug in your home monitor once you get home, and no manual interaction is required.   If you have an Abbott or [employer] device, plug your home monitor once you get home, sit near the device, and press the large activation button. Sit nearby until the process is complete, usually notated by lights on the monitor.   If you were set up for monitoring using an app on your phone, make sure the app remains open in the background and the Bluetooth remains on.  ACTIVITY Do not lift your arm above shoulder height for 1 week after your procedure. After 7 days, you may progress as below.  You should remove your sling 24 hours after your procedure, unless otherwise instructed by your provider.     Thursday July 17, 2024  Friday July 18, 2024 Saturday July 19, 2024 Sunday July 20, 2024   Do not lift, push, pull, or carry anything over 10 pounds with the affected arm until 6 weeks (Thursday August 21, 2024 ) after your procedure.   You may drive AFTER your wound check, unless you have been told otherwise by your provider.   Ask your healthcare provider when you can go back to work   INCISION/Dressing If you are on a blood thinner such as Coumadin, Xarelto, Eliquis, Plavix, or Pradaxa please confirm with your provider when this should be resumed. 07/14/2024  If large square, outer bandage is left in place, this can be removed after 24 hours from your procedure. Do not remove steri-strips or glue as below.   Monitor your defibrillator site for redness, swelling, and drainage. Call the device clinic at [phone] if you experience these symptoms or fever/chills.    If your incision is sealed with Dermabond (glue), you may shower 24 hrs after your procedure or when told by your provider. Do not remove the glue or let the shower hit directly on your site.  You may wash around your site with soap and water .    If you were discharged in a sling, please do not wear this during the day more than 48 hours after your surgery unless otherwise instructed. This may increase the risk of stiffness and soreness in your shoulder.   Avoid lotions, ointments, or perfumes over your incision until it is well-healed.  You may use a hot tub or a pool AFTER your wound check appointment if the incision is completely closed.  Your ICD is designed to protect you from life threatening heart rhythms. Because of this, you may receive a shock.   1 shock with no symptoms:  Call the office during business hours. 1 shock with symptoms (chest pain, chest pressure, dizziness, lightheadedness, shortness of breath, overall feeling unwell):  Call 911. If you experience 2 or more shocks in 24 hours:  Call 911. If you receive a shock, you should not drive for 6 months per the [place] DMV IF you receive appropriate therapy from your ICD.   ICD Alerts:  Some alerts are vibratory and others beep. These are NOT emergencies. Please call our office to let us  know. If this occurs at night or on weekends, it can wait until the next business day. Send a remote transmission.  If your device is capable of reading fluid status (for heart failure), you will be offered monthly monitoring to review this with you.   DEVICE MANAGEMENT Remote  monitoring is used to monitor your ICD from home. This monitoring is scheduled every 91 days by our office. It allows us  to keep an eye on the functioning of your device to ensure it is working properly. You will routinely see your Electrophysiologist annually (more often if necessary). This will appear as a REMOTE check on your MyChart schedule. These are automatic and there is nothing for you to manually do unless otherwise instructed.  You should receive your ID card for your new device in 4-8 weeks. Keep this card with you at all times once received. Consider  wearing a medical alert bracelet or necklace.  Your ICD  may be MRI compatible. This will be discussed at your next office visit/wound check.  You should avoid contact with strong electric or magnetic fields.   Do not use amateur (ham) radio equipment or electric (arc) welding torches. MP3 player headphones with magnets should not be used. Some devices are safe to use if held at least 12 inches (30 cm) from your defibrillator. These include power tools, lawn mowers, and speakers. If you are unsure if something is safe to use, ask your health care provider.  When using your cell phone, hold it to the ear that is on the opposite side from the defibrillator. Do not leave your cell phone in a pocket over the defibrillator.  You may safely use electric blankets, heating pads, computers, and microwave ovens.  Call the office right away if: You have chest pain. You feel more than one shock. You feel more short of breath than you have felt before. You feel more light-headed than you have felt before. Your incision starts to open up.  This information is not intended to replace advice given to you by your health care provider. Make sure you discuss any questions you have with your health care provider.

## 2024-07-11 MED FILL — Midazolam HCl Inj 2 MG/2ML (Base Equivalent): INTRAMUSCULAR | Qty: 2.5 | Status: AC

## 2024-07-14 ENCOUNTER — Encounter: Payer: Self-pay | Admitting: Internal Medicine

## 2024-07-17 ENCOUNTER — Encounter (INDEPENDENT_AMBULATORY_CARE_PROVIDER_SITE_OTHER): Payer: Self-pay | Admitting: Physician Assistant

## 2024-07-17 ENCOUNTER — Ambulatory Visit (INDEPENDENT_AMBULATORY_CARE_PROVIDER_SITE_OTHER): Admitting: Physician Assistant

## 2024-07-17 ENCOUNTER — Telehealth: Payer: Self-pay

## 2024-07-17 VITALS — BP 111/69 | HR 88 | Temp 97.9°F | Ht 73.0 in | Wt 195.0 lb

## 2024-07-17 DIAGNOSIS — H9202 Otalgia, left ear: Secondary | ICD-10-CM | POA: Diagnosis not present

## 2024-07-17 DIAGNOSIS — H9193 Unspecified hearing loss, bilateral: Secondary | ICD-10-CM | POA: Diagnosis not present

## 2024-07-17 NOTE — Progress Notes (Signed)
 Dear Dr. Auston, Here is my assessment for our mutual patient, [name] [name]. Thank you for allowing me the opportunity to care for your patient. Please do not hesitate to contact me should you have any other questions. Sincerely, Chyrl Cohen PA-C  Otolaryngology Clinic Note Referring provider: Dr. Auston HPI:  [name] [name] is a 73 y.o. male kindly referred by Dr. Auston   Discussed the use of AI scribe software for clinical note transcription with the patient, who gave verbal consent to proceed.  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 He was referred by Dr. Auston for evaluation of left ear pain and discomfort.  He has been using hearing aids since 2018 or 2019, which were initially comfortable. In the last few years, he has experienced soreness in the left ear, particularly at night when sleeping on that side. The pain is described as 'like a spike through the brain' and is intermittent.  He has a history of TMJ, which occasionally causes a popping sensation in the left ear, especially when chewing. About four to five years ago, the TMJ was more frequent and painful, sometimes stopping him from eating. The last significant episode was three months ago.  Approximately two months ago, he was prescribed ear drops for a discharge in the left ear, which he used for two weeks. The ear pain is most noticeable when lying on the left side, and he feels that the architecture of his ear canal has changed since he first got the hearing aids.  He suspects that his aggressive use of Q-tips might contribute to the issue, although he treats both ears the same. His hearing has worsened over time, with a minor change noted in a hearing test last year.  As a child, he experienced terrible earaches but does not recall drainage. He also grinds his teeth at night, which may contribute to the  discomfort.  Reports worsening hearing and occasional spiky pain in the left ear.           Independent Review of Additional Tests or Records:  none   PMH/Meds/All/SocHx/FamHx/ROS:   Past Medical History:  Diagnosis Date   ADHD (attention deficit hyperactivity disorder)    Arthritis    Carpal tunnel syndrome    Right   Cataracts, bilateral    very mild   Coronary artery disease    Depression    Early cataracts, bilateral    Fatty liver    Gallstones    GERD (gastroesophageal reflux disease)    Headache 2011   HX OF VISUAL MIGRAINES - NO PAIN --MIGRAINES ATTRIBUTED TO PT CUTTING NICOTINE  PATCH IN HALF BEFORE APPLYING TO HIS SKIN.     Hepatitis 2001   C -treated and no problems since  2001 'cured'   History of hiatal hernia    patient denies this dx as of 04/13/23   History of kidney stones    found on CT Scan   HOH (hard of hearing)    wears hearing aids   lung ca dx'd 12/06/19   RIGHT Lower  LUNG   Mild cognitive impairment 12/19/2021   Muscle cramps    hands and legs   Osteopenia    Pain    KNEE OA   Pre-diabetes    diet controlled   Right lower lobe lung mass    Shortness of breath dyspnea    SOMETIMES  FEELS LIKE I CAN'T CATCH A DEEP BREATH - ON GOING FOR YEARS - STATES MEDICAL DOCTOR SENT HIM FOR CARDIAC WORK UP 2011 - PT STATES HEART OK   Stroke (HCC)    HX POSSIBLE TIA - RT LEG NUMBNESS AND TINGLING FINGERS AND NUMBNESS RT FACE - ALL RESOLVED ; STATES NEUROLOGIST COULD NOT FIND ANY EVIDENCE OF STROKE - THOUGHT PT'S EPISODE MIGHT BE FROM CUTTING NICOTINE  PATCH IN HALF AND APPLYING TO SKIN   Trigger finger    LEFT MIDDLE FINGER   Wears glasses      Past Surgical History:  Procedure Laterality Date   ANTERIOR CERVICAL DECOMP/DISCECTOMY FUSION N/A 09/26/2022   Procedure: Cervical four-five Anterior Cervical Decompression Fusion;  Surgeon: Colon Shove, MD;  Location: [facility] OR;  Service: Neurosurgery;  Laterality: N/A;   BRONCHIAL BIOPSY  02/24/2020    Procedure: BRONCHIAL BIOPSIES;  Surgeon: Shelah Lamar RAMAN, MD;  Location: [facility] ENDOSCOPY;  Service: Pulmonary;;   BRONCHIAL BRUSHINGS  02/24/2020   Procedure: BRONCHIAL BRUSHINGS;  Surgeon: Shelah Lamar RAMAN, MD;  Location: [facility] ENDOSCOPY;  Service: Pulmonary;;   BRONCHIAL NEEDLE ASPIRATION BIOPSY  02/24/2020   Procedure: BRONCHIAL NEEDLE ASPIRATION BIOPSIES;  Surgeon: Shelah Lamar RAMAN, MD;  Location: [facility] ENDOSCOPY;  Service: Pulmonary;;   BRONCHIAL WASHINGS  02/24/2020   Procedure: BRONCHIAL WASHINGS;  Surgeon: Shelah Lamar RAMAN, MD;  Location: [facility] ENDOSCOPY;  Service: Pulmonary;;   COLONOSCOPY     CORONARY PRESSURE/FFR STUDY N/A 08/09/2022   Procedure: INTRAVASCULAR PRESSURE WIRE/FFR STUDY;  Surgeon: Wendel Lurena POUR, MD;  Location: [facility] INVASIVE CV LAB;  Service: Cardiovascular;  Laterality: N/A;   ICD IMPLANT N/A 07/10/2024   Procedure: ICD IMPLANT;  Surgeon: Almetta Donnice LABOR, MD;  Location: [facility] INVASIVE CV LAB;  Service: Cardiovascular;  Laterality: N/A;   INTERCOSTAL NERVE BLOCK Right 04/28/2020   Procedure: INTERCOSTAL NERVE BLOCK;  Surgeon: Shyrl Linnie KIDD, MD;  Location: [facility] OR;  Service: Thoracic;  Laterality: Right;   left ankle surgery     fractured   LEFT HEART CATH AND CORONARY ANGIOGRAPHY N/A 06/29/2021   Procedure: LEFT HEART CATH AND CORONARY ANGIOGRAPHY;  Surgeon: Wendel Lurena POUR, MD;  Location: [facility] INVASIVE CV LAB;  Service: Cardiovascular;  Laterality: N/A;   LEFT HEART CATH AND CORONARY ANGIOGRAPHY N/A 08/09/2022   Procedure: LEFT HEART CATH AND CORONARY ANGIOGRAPHY;  Surgeon: Wendel Lurena POUR, MD;  Location: [facility] INVASIVE CV LAB;  Service: Cardiovascular;  Laterality: N/A;   left knee surgery     MENISCAL REMOVED   left shoulder     torn ligaments left shoulder   LUMBAR FUSION     L3 L4 L5   NODE DISSECTION Right 04/28/2020   Procedure: NODE DISSECTION;  Surgeon: Shyrl Linnie KIDD, MD;  Location: [facility] OR;  Service: Thoracic;  Laterality: Right;   RIGHT CARPAL TUNNEL RELEASE   2011   RIGHT KNEE ARTHROSCOPY   AUG OR SEPT 2015   AT [facility]   ROOT CANAL     ROTATOR CUFF REPAIR Left    2007   THORACOSCOPY Right 04/28/2020   XI ROBOTIC ASSISTED THORASCOPY-LOBECTOMY (Right)   TONSILLECTOMY     as child   TOTAL KNEE ARTHROPLASTY Left 07/14/2013   Procedure: LEFT TOTAL KNEE ARTHROPLASTY;  Surgeon: Donnice JONETTA Car, MD;  Location: [facility] ORS;  Service: Orthopedics;  Laterality: Left;   TOTAL KNEE ARTHROPLASTY Right 07/21/2014   Procedure: RIGHT TOTAL KNEE ARTHROPLASTY;  Surgeon: Donnice JONETTA Car, MD;  Location: [facility] ORS;  Service: Orthopedics;  Laterality:  Right;   TOTAL SHOULDER ARTHROPLASTY Right 10/17/2019   Procedure: RIGHT REVERSE TOTAL SHOULDER REPLACEMENT;  Surgeon: Kay Kemps, MD;  Location: [facility] ORS;  Service: Orthopedics;  Laterality: Right;  interscalene block   TRIGGER FINGER RELEASE Left    UMBILICAL HERNIA REPAIR N/A 10/17/2023   Procedure: HERNIA REPAIR UMBILICAL ADULT;  Surgeon: Polly Cordella LABOR, MD;  Location: [facility] OR;  Service: General;  Laterality: N/A;  GEN AND TAP BLOCK   VIDEO BRONCHOSCOPY WITH ENDOBRONCHIAL NAVIGATION Right 02/24/2020   Procedure: VIDEO BRONCHOSCOPY WITH ENDOBRONCHIAL NAVIGATION;  Surgeon: Shelah Lamar RAMAN, MD;  Location: [facility] ENDOSCOPY;  Service: Pulmonary;  Laterality: Right;   WEDGE RESECTION Right 04/28/2020   XI ROBOTIC ASSISTED THORASCOPY-RIGHT LOWER LOBE WEDGE RESECTION (Right Chest)     Family History  Problem Relation Age of Onset   Alcohol  abuse Father    Hyperlipidemia Father    Heart attack Brother 64   Hyperlipidemia Brother    Hyperlipidemia Sister        3 sisters   Heart attack Brother    Hyperlipidemia Brother      Social Connections: Not on file      Current Outpatient Medications:    amoxicillin (AMOXIL) 500 MG capsule, Take 2,000 mg by mouth once., Disp: , Rfl:    amphetamine-dextroamphetamine (ADDERALL) 20 MG tablet, Take 20 mg by mouth daily as needed (Adhd). afternoon, Disp: , Rfl:     amphetamine-dextroamphetamine (ADDERALL) 30 MG tablet, Take 30 mg by mouth every morning., Disp: , Rfl:    aspirin  EC 81 MG tablet, Take 81 mg by mouth daily. Swallow whole., Disp: , Rfl:    cyclobenzaprine  (FLEXERIL ) 5 MG tablet, Take 1 tablet (5 mg total) by mouth 3 (three) times daily as needed for muscle spasms., Disp: 30 tablet, Rfl: 0   diclofenac Sodium (VOLTAREN ARTHRITIS PAIN) 1 % GEL, Apply 1 Application topically 4 (four) times daily as needed (pain in right hand)., Disp: , Rfl:    Evolocumab  (REPATHA  SURECLICK) 140 MG/ML SOAJ, INJECT 140 MG INTO THE SKIN EVERY 14 (FOURTEEN) DAYS., Disp: 6 mL, Rfl: 1   loratadine  (CLARITIN ) 10 MG tablet, Take 10 mg by mouth in the morning., Disp: , Rfl:    Magnesium  Oxide -Mg Supplement 500 MG TABS, Take 500 mg by mouth daily., Disp: , Rfl:    polyethylene glycol (MIRALAX  / GLYCOLAX ) 17 g packet, Take 17 g by mouth daily., Disp: , Rfl:    sertraline  (ZOLOFT ) 100 MG tablet, Take 100 mg by mouth in the morning., Disp: , Rfl:    tamsulosin  (FLOMAX ) 0.4 MG CAPS capsule, Take 0.4 mg by mouth in the morning., Disp: , Rfl:    Physical Exam:   BP 111/69   Pulse 88   Temp 97.9 F (36.6 C)   Ht 6' 1 (1.854 m)   Wt 195 lb (88.5 kg)   SpO2 92%   BMI 25.73 kg/m   Pertinent Findings  CN II-XII grossly intact Bilateral EAC clear and TM intact with well pneumatized middle ear spaces; minimal exostosis of left EAC Weber 512: equal Rinne 512: AC > BC b/l  Anterior rhinoscopy: Septum midline; bilateral inferior turbinates with no hypertrophy No lesions of oral cavity/oropharynx No obviously palpable neck masses/lymphadenopathy/thyromegaly No respiratory distress or stridor No crepitus or pain at the palpation of the TMJ   Seprately Identifiable Procedures:  None  Impression & Plans:  Corbin Falck is a 73 y.o. male with the following   Assessment and Plan    Left  ear pain   Intermittent left ear pain linked to hearing aid use and or TMJ  disorder. Pain exacerbated by lying on the left side. Differential includes poor hearing aid fit or TMJ-related inflammation.   - Consult hearing aid provider for fit assessment and potential new aids. - Consult dentist for oral appliance to reduce TMJ inflammation. - Trial not wearing left hearing aid for two weeks to assess pain impact. - Avoid aggressive Q-tip use in left ear.  Bilateral hearing loss Minor changes noted last year. Hearing aids in use since 2018. - Ensure regular hearing evaluations.           - f/u PRN   Thank you for allowing me the opportunity to care for your patient. Please do not hesitate to contact me should you have any other questions.  Sincerely, Chyrl Cohen PA-C [facility] Phone: [phone] Fax: [phone]  07/17/2024, 2:50 PM

## 2024-07-17 NOTE — Telephone Encounter (Signed)
 Follow-up after same day discharge: Implant date: 07/10/2024 MD: MCA Device: ICD Location: L chest    Wound check visit: 07/22/2024 90 day MD follow-up: 10/10/2024  Remote Transmission received:yes  Dressing/sling removed: n/a  Confirm OAC restart on: yes   Please continue to monitor your cardiac device site for redness, swelling, and drainage. Call the device clinic at [phone] if you experience these symptoms, fever/chills, or have questions about your device.   Remote monitoring is used to monitor your cardiac device from home. This monitoring is scheduled every 91 days by our office. It allows us  to keep an eye on the functioning of your device to ensure it is working properly.

## 2024-07-22 ENCOUNTER — Ambulatory Visit: Attending: Cardiology | Admitting: *Deleted

## 2024-07-22 DIAGNOSIS — Z95810 Presence of automatic (implantable) cardiac defibrillator: Secondary | ICD-10-CM | POA: Insufficient documentation

## 2024-07-22 DIAGNOSIS — I255 Ischemic cardiomyopathy: Secondary | ICD-10-CM | POA: Insufficient documentation

## 2024-07-22 LAB — CUP PACEART INCLINIC DEVICE CHECK
Date Time Interrogation Session: 20251111141944
Implantable Lead Connection Status: 753985
Implantable Lead Implant Date: 20251030
Implantable Lead Location: 753860
Implantable Lead Serial Number: 81639378
Implantable Pulse Generator Implant Date: 20251030
Pulse Gen Model: 429525
Pulse Gen Serial Number: 88738699

## 2024-07-22 NOTE — Progress Notes (Signed)
 Normal single chamber ICD wound check. Wound well healed. Presenting rhythm: AS/VS. Routine testing performed. Thresholds, sensing, and impedance consistent with implant measurements with 3.5V safety margin/auto capture until 3 month visit. No treated arrhythmias. Reviewed arm restrictions to continue for 6 weeks total post op. Reviewed shock plan.  Pt enrolled in remote follow-up.

## 2024-07-22 NOTE — Patient Instructions (Signed)

## 2024-07-27 ENCOUNTER — Ambulatory Visit: Payer: Self-pay | Admitting: Student in an Organized Health Care Education/Training Program

## 2024-07-29 ENCOUNTER — Telehealth: Payer: Self-pay

## 2024-07-29 NOTE — Telephone Encounter (Signed)
 Pt called in wanting to know if he is able to use a welder or a sauter with his device

## 2024-07-29 NOTE — Telephone Encounter (Signed)
 Spoke with patient. Gave him the direct number to [employer] to call and discuss the specific welding/sautering that he uses so the engineers can tell him specific areas of concern with his device.   Patient thanked me for the number and will follow up with them.

## 2024-08-21 ENCOUNTER — Ambulatory Visit: Attending: Student in an Organized Health Care Education/Training Program

## 2024-08-21 DIAGNOSIS — I5020 Unspecified systolic (congestive) heart failure: Secondary | ICD-10-CM | POA: Diagnosis not present

## 2024-08-22 LAB — CUP PACEART REMOTE DEVICE CHECK
Date Time Interrogation Session: 20251211072348
Implantable Lead Connection Status: 753985
Implantable Lead Implant Date: 20251030
Implantable Lead Location: 753860
Implantable Lead Serial Number: 81639378
Implantable Pulse Generator Implant Date: 20251030
Pulse Gen Model: 429525
Pulse Gen Serial Number: 88738699

## 2024-08-28 NOTE — Progress Notes (Signed)
 Remote ICD Transmission

## 2024-09-05 ENCOUNTER — Ambulatory Visit: Payer: Self-pay | Admitting: Student in an Organized Health Care Education/Training Program

## 2024-09-17 ENCOUNTER — Encounter: Payer: Self-pay | Admitting: Internal Medicine

## 2024-10-01 ENCOUNTER — Other Ambulatory Visit (HOSPITAL_COMMUNITY): Payer: Self-pay | Admitting: Neurological Surgery

## 2024-10-01 DIAGNOSIS — M4316 Spondylolisthesis, lumbar region: Secondary | ICD-10-CM

## 2024-10-10 ENCOUNTER — Encounter: Payer: Self-pay | Admitting: Student

## 2024-10-10 ENCOUNTER — Ambulatory Visit: Attending: Student | Admitting: Student

## 2024-10-10 VITALS — BP 104/64 | HR 94 | Ht 73.0 in | Wt 199.8 lb

## 2024-10-10 DIAGNOSIS — I255 Ischemic cardiomyopathy: Secondary | ICD-10-CM | POA: Diagnosis present

## 2024-10-10 DIAGNOSIS — I10 Essential (primary) hypertension: Secondary | ICD-10-CM | POA: Diagnosis present

## 2024-10-10 DIAGNOSIS — I5020 Unspecified systolic (congestive) heart failure: Secondary | ICD-10-CM | POA: Diagnosis present

## 2024-10-10 DIAGNOSIS — Z95810 Presence of automatic (implantable) cardiac defibrillator: Secondary | ICD-10-CM | POA: Diagnosis present

## 2024-10-10 LAB — CUP PACEART INCLINIC DEVICE CHECK
Battery Remaining Percentage: 100 %
Battery Voltage: 3.14 V
Brady Statistic AS VP Percent: 0 %
Brady Statistic AS VS Percent: 100 %
Brady Statistic RV Percent Paced: 0 %
Date Time Interrogation Session: 20260130121847
HighPow Impedance: 76 Ohm
Implantable Lead Connection Status: 753985
Implantable Lead Implant Date: 20251030
Implantable Lead Location: 753860
Implantable Lead Serial Number: 81639378
Implantable Pulse Generator Implant Date: 20251030
Lead Channel Impedance Value: 460 Ohm
Lead Channel Pacing Threshold Amplitude: 0.8 V
Lead Channel Pacing Threshold Pulse Width: 0.4 ms
Lead Channel Setting Pacing Amplitude: 3 V
Lead Channel Setting Pacing Pulse Width: 0.4 ms
Lead Channel Setting Sensing Sensitivity: 0.8 mV
Pulse Gen Model: 429525
Pulse Gen Serial Number: 88738699

## 2024-10-10 NOTE — Patient Instructions (Signed)
 Medication Instructions:  No medication changes today. *If you need a refill on your cardiac medications before your next appointment, please call your pharmacy*  Lab Work: No labwork ordered today. If you have labs (blood work) drawn today and your tests are completely normal, you will receive your results only by: MyChart Message (if you have MyChart) OR A paper copy in the mail If you have any lab test that is abnormal or we need to change your treatment, we will call you to review the results.  Testing/Procedures: No testing ordered today  Follow-Up: At [facility], you and your health needs are our priority.  As part of our continuing mission to provide you with exceptional heart care, our providers are all part of one team.  This team includes your primary Cardiologist (physician) and Advanced Practice Providers or APPs (Physician Assistants and Nurse Practitioners) who all work together to provide you with the care you need, when you need it.  Your next appointment:   12 month(s)  Provider:   You may see [name], MD or one of the following Advanced Practice Providers on your designated Care Team:   Charlies Arthur, PA-C Dior Dominik Andy Stepan Verrette, PA-C Suzann Riddle, NP Daphne Barrack, NP    We recommend signing up for the patient portal called MyChart.  Sign up information is provided on this After Visit Summary.  MyChart is used to connect with patients for Virtual Visits (Telemedicine).  Patients are able to view lab/test results, encounter notes, upcoming appointments, etc.  Non-urgent messages can be sent to your provider as well.   To learn more about what you can do with MyChart, go to [email or web address].

## 2024-10-10 NOTE — Progress Notes (Signed)
" °  Electrophysiology Office Note:   ID:  Ario, Mcdiarmid 08-27-1951, MRN [id number]  Primary Cardiologist: Vinie JAYSON Maxcy, MD Electrophysiologist: [name], MD      History of Present Illness:   [name] [name] is a 73 y.o. male with h/o HFrEF s/p ICD, CAD, HLD, and h/o Lung CA seen today for routine electrophysiology follow-up s/p Defibrillator implant.  Since last being seen in our clinic the patient reports doing OK from a cardiac perspective. Earlier this month did have a day of low BPs on his home monitor. Systolic in 90s most of the day before It came back up into the 100s. Mild lightheadedness at times, not especially worse that day. No chest pain, syncope, or edema.   Review of systems complete and found to be negative unless listed in HPI.   EP Information / Studies Reviewed:    EKG is not ordered today. EKG from 07/10/2024 reviewed which showed NSR at 75 bpm       ICD Interrogation-  reviewed in detail today,  See PACEART report.  Arrhythmia/Device History [employer] Single chamber ICD 07/10/2024 for CHF    Physical Exam:   VS:  BP 104/64   Pulse 94   Ht 6' 1 (1.854 m)   Wt 199 lb 12.8 oz (90.6 kg)   SpO2 97%   BMI 26.36 kg/m    Wt Readings from Last 3 Encounters:  10/10/24 199 lb 12.8 oz (90.6 kg)  07/17/24 195 lb (88.5 kg)  07/10/24 195 lb (88.5 kg)     GEN: No acute distress  NECK: No JVD; No carotid bruits CARDIAC: Regular rate and rhythm, no murmurs, rubs, gallops RESPIRATORY:  Clear to auscultation without rales, wheezing or rhonchi  ABDOMEN: Soft, non-tender, non-distended EXTREMITIES:  No edema; No deformity   ASSESSMENT AND PLAN:    Chronic systolic CHF  s/p Bi[employer] single chamber ICD  euvolemic today Stable on an appropriate medical regimen Normal ICD function See Pace Art report No changes today  HTN Stable on current regimen   CAD No s/s of ischemia.      Disposition:   Follow up with EP Team in 12  months   Signed, [name] Prentice Passey, PA-C  "

## 2024-10-12 ENCOUNTER — Ambulatory Visit: Payer: Self-pay | Admitting: Student in an Organized Health Care Education/Training Program

## 2024-10-14 ENCOUNTER — Ambulatory Visit (HOSPITAL_COMMUNITY)
Admission: RE | Admit: 2024-10-14 | Discharge: 2024-10-14 | Disposition: A | Source: Ambulatory Visit | Attending: Internal Medicine | Admitting: Internal Medicine

## 2024-10-14 ENCOUNTER — Inpatient Hospital Stay

## 2024-10-14 DIAGNOSIS — C3490 Malignant neoplasm of unspecified part of unspecified bronchus or lung: Secondary | ICD-10-CM

## 2024-10-14 LAB — CMP (CANCER CENTER ONLY)
ALT: 19 U/L (ref 0–44)
AST: 26 U/L (ref 15–41)
Albumin: 4.3 g/dL (ref 3.5–5.0)
Alkaline Phosphatase: 109 U/L (ref 38–126)
Anion gap: 10 (ref 5–15)
BUN: 23 mg/dL (ref 8–23)
CO2: 25 mmol/L (ref 22–32)
Calcium: 9.3 mg/dL (ref 8.9–10.3)
Chloride: 100 mmol/L (ref 98–111)
Creatinine: 1.03 mg/dL (ref 0.61–1.24)
GFR, Estimated: >60 mL/min
Glucose, Bld: 153 mg/dL — ABNORMAL HIGH (ref 70–99)
Potassium: 4.7 mmol/L (ref 3.5–5.1)
Sodium: 134 mmol/L — ABNORMAL LOW (ref 135–145)
Total Bilirubin: 0.6 mg/dL (ref 0.0–1.2)
Total Protein: 7 g/dL (ref 6.5–8.1)

## 2024-10-14 LAB — CBC WITH DIFFERENTIAL (CANCER CENTER ONLY)
Abs Immature Granulocytes: 0.02 10*3/uL (ref 0.00–0.07)
Basophils Absolute: 0 10*3/uL (ref 0.0–0.1)
Basophils Relative: 1 %
Eosinophils Absolute: 0.2 10*3/uL (ref 0.0–0.5)
Eosinophils Relative: 3 %
HCT: 44.6 % (ref 39.0–52.0)
Hemoglobin: 15.2 g/dL (ref 13.0–17.0)
Immature Granulocytes: 0 %
Lymphocytes Relative: 17 %
Lymphs Abs: 1.2 10*3/uL (ref 0.7–4.0)
MCH: 30 pg (ref 26.0–34.0)
MCHC: 34.1 g/dL (ref 30.0–36.0)
MCV: 88 fL (ref 80.0–100.0)
Monocytes Absolute: 0.5 10*3/uL (ref 0.1–1.0)
Monocytes Relative: 8 %
Neutro Abs: 4.7 10*3/uL (ref 1.7–7.7)
Neutrophils Relative %: 71 %
Platelet Count: 143 10*3/uL — ABNORMAL LOW (ref 150–400)
RBC: 5.07 MIL/uL (ref 4.22–5.81)
RDW: 13.1 % (ref 11.5–15.5)
WBC Count: 6.6 10*3/uL (ref 4.0–10.5)
nRBC: 0 % (ref 0.0–0.2)

## 2024-10-14 MED ORDER — IOHEXOL 300 MG/ML  SOLN
75.0000 mL | Freq: Once | INTRAMUSCULAR | Status: AC | PRN
  Administered 2024-10-14: 75 mL via INTRAVENOUS

## 2024-10-15 ENCOUNTER — Telehealth: Payer: Self-pay | Admitting: Internal Medicine

## 2024-10-15 NOTE — Telephone Encounter (Signed)
 PCP gave pt a BP monitor to pt and it has also shown some low HR readings.  Attempted to read last ICD transmission and unable to determine what device settings were.  Aware that he may be having occasional PVCs which could alter readings. States he was awake every time HR has been reported low. He also woke up this morning fatigues.  Forwarding to device clinic for clarification of settings to further determine next steps. Aware device clinic/triage nurse will follow up tomorrow. Patient verbalized understanding and agreeable to plan.

## 2024-10-15 NOTE — Telephone Encounter (Signed)
 STAT if HR is under 50 or over 120 (normal HR is 60-100 beats per minute)  What is your heart rate?  50  Do you have a log of your heart rate readings (document readings)? 52,95,50  Do you have any other symptoms? Fatigue, Dizziness

## 2024-10-16 NOTE — Telephone Encounter (Signed)
 In reviewing his transmission today in [employer] -  He is programmed VDI 40 w/ recent EGM's showing bigeminal PVC's.  He does not VP.  States his heart rates have been as low as in the 50's.  HR graphs show normal Heart Rates.   PVC: 5%  Symptoms: fatigue, dizziness    Appointment made with Daphne Barrack, NP for tomorrow 10/17/24 at 245pm.

## 2024-10-17 ENCOUNTER — Encounter: Payer: Self-pay | Admitting: Pulmonary Disease

## 2024-10-17 ENCOUNTER — Ambulatory Visit: Admitting: Pulmonary Disease

## 2024-10-17 ENCOUNTER — Other Ambulatory Visit (HOSPITAL_COMMUNITY): Payer: Self-pay | Admitting: Neurological Surgery

## 2024-10-17 ENCOUNTER — Telehealth: Payer: Self-pay

## 2024-10-17 VITALS — BP 102/62 | HR 101 | Ht 73.0 in | Wt 204.0 lb

## 2024-10-17 DIAGNOSIS — I493 Ventricular premature depolarization: Secondary | ICD-10-CM

## 2024-10-17 DIAGNOSIS — M4802 Spinal stenosis, cervical region: Secondary | ICD-10-CM

## 2024-10-17 DIAGNOSIS — M4316 Spondylolisthesis, lumbar region: Secondary | ICD-10-CM

## 2024-10-17 DIAGNOSIS — I5020 Unspecified systolic (congestive) heart failure: Secondary | ICD-10-CM

## 2024-10-17 DIAGNOSIS — Z95810 Presence of automatic (implantable) cardiac defibrillator: Secondary | ICD-10-CM

## 2024-10-17 DIAGNOSIS — I10 Essential (primary) hypertension: Secondary | ICD-10-CM

## 2024-10-17 LAB — CUP PACEART INCLINIC DEVICE CHECK
Date Time Interrogation Session: 20260206170720
Implantable Lead Connection Status: 753985
Implantable Lead Implant Date: 20251030
Implantable Lead Location: 753860
Implantable Lead Serial Number: 81639378
Implantable Pulse Generator Implant Date: 20251030
Pulse Gen Model: 429525
Pulse Gen Serial Number: 88738699

## 2024-10-17 NOTE — Telephone Encounter (Signed)
"  ° °  Pre-operative Risk Assessment    Patient Name: [name] [name]  DOB: 1951-08-15 MRN: [id number]   Date of last office visit: 10/10/24 [place] Date of next office visit: 10/17/24 [facility]   Request for Surgical Clearance    Procedure:  XLIF L2-L3  Date of Surgery:  Clearance TBD                                Surgeon:  Dr. Victory Gens Surgeon's Group or Practice Name:  [facility] Phone number:  [phone] Fax number:  [phone]   Type of Clearance Requested:   - Medical  - Pharmacy:  Hold Aspirin      Type of Anesthesia:  Not Indicated   Additional requests/questions:    Bonney Sharlet Hint   10/17/2024, 2:43 PM   "

## 2024-10-17 NOTE — Progress Notes (Signed)
 " Electrophysiology Office Note:   Date:  10/17/2024  ID:  [name] [name], DOB 07/25/1951, MRN [id number]  Primary Cardiologist: Vinie JAYSON Maxcy, MD Primary Heart Failure: None Electrophysiologist: [name], MD       History of Present Illness:   [name] [name] is a 74 y.o. male with h/o HFrEF s/p ICD, CAD, HLD, lung cancer seen today for acute visit due to fatigue.    Pt seen in EP Clinic 10/10/24 - he noted intermittent low BP and mild lightheadedness. No changes made as symptoms had resolved at that point.  He called the EP Clinic 10/15/24 with reports of low HR readings.  Remote interrogation of device showed VDI 40 programming. EGM with bigeminal PVC's.  Graph shows normal HR's.  PVC burden of 5%.   Patient reports he has been very fatigued in the last 2-3 weeks. He has increased back pain and saw Dr. Colon this am and is pending surgery after an MRI.  He reports he feels weak and dizzy at times.  He sleeps 7-10 hours per night.  He drinks 15 cups of coffee per day and recently cut back to 5 cups.  He snores but had a negative sleep study 10 years ago and has lost weight since then.  He reports he has been depressed for many years and is on an SSRI.  Denies intent for self harm. His wife accompanies him to the visit. He denies a change in his activity level - states I can't sit still.  He reports pin point chest discomfort that is worse when he pokes the spot where he hurts. He continues to work in the yard and the house. He denies chest pain, palpitations, dyspnea, PND, orthopnea, nausea, vomiting, dizziness, syncope, edema, weight gain, or early satiety.   Review of systems complete and found to be negative unless listed in HPI.    EP Information / Studies Reviewed:    EKG is ordered today. Personal review as below.  EKG Interpretation Date/Time:  Friday October 17 2024 15:00:01 EST Ventricular Rate:  101 PR Interval:  172 QRS Duration:  94 QT Interval:  360 QTC  Calculation: 466 R Axis:   -80  Text Interpretation: Sinus tachycardia Left axis deviation Confirmed by Aniceto Jarvis ([id number]) on 10/17/2024 3:07:09 PM   ICD Interrogation-  reviewed in detail today,  See PACEART report.  Device History: Bi[employer] Single Chamber ICD implanted 07/10/24 for HFrEF History of appropriate therapy: No History of AAD therapy: No   Risk Assessment/Calculations:              Physical Exam:   VS:  BP 102/62   Pulse (!) 101   Ht 6' 1 (1.854 m)   Wt 204 lb (92.5 kg)   SpO2 96%   BMI 26.91 kg/m    Wt Readings from Last 3 Encounters:  10/17/24 204 lb (92.5 kg)  10/10/24 199 lb 12.8 oz (90.6 kg)  07/17/24 195 lb (88.5 kg)     GEN: Well nourished, well developed in no acute distress NECK: No JVD; No carotid bruits CARDIAC: Regular rate and rhythm, no murmurs, rubs, gallops. Device site wnl, no tethering  RESPIRATORY:  Clear to auscultation without rales, wheezing or rhonchi  ABDOMEN: Soft, non-tender, non-distended EXTREMITIES:  No edema; No deformity   ASSESSMENT AND PLAN:    Chronic Systolic Dysfunction s/p Bi[employer]ik single chamber ICD  PVC's LVEF 04/2024 30-35% -euvolemic on exam   -Stable on an appropriate medical regimen -Normal ICD function -See Elisabeth  Art report -No changes today -4% PVC burden   CAD  LHC 2023 with unchanged burden of disease, CTO of RCA collateralized by the left system -no anginal symptoms    Fatigue  -PVC burden low, doubt this is source of fatigue. Baseline low EF.   -H/H normal 10/11/23 > no concerns for bleeding / anemia  -normal valves on auscultation on exam  -concern for high caffeine intake and abrupt reduction > may be some of his fatigue  -no recent steroids  -he has regular follow up with Dr. Sherrod for his lung cancer  -Na+ just slightly low on his recent labs > he may need to liberalize salt just a bit given dizziness and soft normal BP readings -no known thyroid  issues  -chronic pain may be  contributing to fatigue -encouraged patient to visit with PCP regarding fatigue. At the end of our discussion, he states he believes it to be his back issues -ER precautions given for chest pain / SOB, nausea etc     Disposition:   Follow up with  in 12 months   Signed, Daphne Barrack, NP-C, AGACNP-BC [facility] - Electrophysiology  10/17/2024, 5:11 PM  "

## 2024-10-17 NOTE — Patient Instructions (Addendum)
 Medication Instructions:  Your physician recommends that you continue on your current medications as directed. Please refer to the Current Medication list given to you today.  *If you need a refill on your cardiac medications before your next appointment, please call your pharmacy*  Lab Work: None ordered If you have labs (blood work) drawn today and your tests are completely normal, you will receive your results only by: MyChart Message (if you have MyChart) OR A paper copy in the mail If you have any lab test that is abnormal or we need to change your treatment, we will call you to review the results.  Follow-Up: At [facility], you and your health needs are our priority.  As part of our continuing mission to provide you with exceptional heart care, our providers are all part of one team.  This team includes your primary Cardiologist (physician) and Advanced Practice Providers or APPs (Physician Assistants and Nurse Practitioners) who all work together to provide you with the care you need, when you need it.  Your next appointment:   1 year(s)  Provider:   Donnice Primus, MD    Other Instructions Be sure you are eating, drinking water  and liberalizing your salt intake

## 2024-10-21 ENCOUNTER — Inpatient Hospital Stay: Admitting: Internal Medicine

## 2024-11-13 ENCOUNTER — Ambulatory Visit (HOSPITAL_COMMUNITY)

## 2024-11-20 ENCOUNTER — Ambulatory Visit

## 2025-02-19 ENCOUNTER — Ambulatory Visit

## 2025-05-21 ENCOUNTER — Ambulatory Visit

## 2025-08-20 ENCOUNTER — Ambulatory Visit
